# Patient Record
Sex: Male | Born: 1940
Health system: Southern US, Community
[De-identification: ages and names within clinical notes are randomized; demographics above are authoritative.]

## PROBLEM LIST (undated history)

## (undated) DIAGNOSIS — J449 Chronic obstructive pulmonary disease, unspecified: Secondary | ICD-10-CM

## (undated) DIAGNOSIS — E785 Hyperlipidemia, unspecified: Secondary | ICD-10-CM

## (undated) DIAGNOSIS — I10 Essential (primary) hypertension: Secondary | ICD-10-CM

## (undated) DIAGNOSIS — J189 Pneumonia, unspecified organism: Secondary | ICD-10-CM

## (undated) DIAGNOSIS — Z860100 Personal history of colon polyps, unspecified: Secondary | ICD-10-CM

## (undated) DIAGNOSIS — J45909 Unspecified asthma, uncomplicated: Secondary | ICD-10-CM

## (undated) HISTORY — PX: CATARACT EXTRACTION: SUR2

## (undated) HISTORY — DX: Personal history of colonic polyps: Z86.010

## (undated) HISTORY — DX: Chronic obstructive pulmonary disease, unspecified: J44.9

## (undated) HISTORY — DX: Pneumonia, unspecified organism: J18.9

## (undated) HISTORY — DX: Hyperlipidemia, unspecified: E78.5

## (undated) HISTORY — DX: Personal history of colon polyps, unspecified: Z86.0100

## (undated) HISTORY — DX: Essential (primary) hypertension: I10

---

## 1980-06-02 HISTORY — PX: LUMBAR LAMINECTOMY: SHX95

## 1993-06-02 HISTORY — PX: CHOLECYSTECTOMY: SHX55

## 1996-06-02 HISTORY — PX: OTHER SURGICAL HISTORY: SHX169

## 1998-11-08 ENCOUNTER — Ambulatory Visit (HOSPITAL_COMMUNITY): Admission: RE | Admit: 1998-11-08 | Discharge: 1998-11-09 | Payer: Self-pay

## 2001-06-02 HISTORY — PX: POLYPECTOMY: SHX149

## 2002-01-04 ENCOUNTER — Ambulatory Visit (HOSPITAL_COMMUNITY): Admission: RE | Admit: 2002-01-04 | Discharge: 2002-01-04 | Payer: Self-pay | Admitting: Gastroenterology

## 2002-01-04 ENCOUNTER — Encounter (INDEPENDENT_AMBULATORY_CARE_PROVIDER_SITE_OTHER): Payer: Self-pay | Admitting: Specialist

## 2004-01-02 ENCOUNTER — Encounter: Payer: Self-pay | Admitting: Internal Medicine

## 2004-04-17 ENCOUNTER — Ambulatory Visit: Payer: Self-pay | Admitting: Internal Medicine

## 2004-09-25 ENCOUNTER — Ambulatory Visit: Payer: Self-pay | Admitting: Internal Medicine

## 2005-02-11 ENCOUNTER — Ambulatory Visit: Payer: Self-pay | Admitting: Internal Medicine

## 2005-04-10 ENCOUNTER — Ambulatory Visit: Payer: Self-pay | Admitting: Internal Medicine

## 2005-04-28 ENCOUNTER — Ambulatory Visit: Payer: Self-pay | Admitting: Internal Medicine

## 2006-01-01 ENCOUNTER — Ambulatory Visit: Payer: Self-pay | Admitting: Internal Medicine

## 2006-03-19 ENCOUNTER — Ambulatory Visit: Payer: Self-pay | Admitting: Internal Medicine

## 2006-06-16 ENCOUNTER — Ambulatory Visit: Payer: Self-pay | Admitting: Internal Medicine

## 2006-09-09 ENCOUNTER — Ambulatory Visit: Payer: Self-pay | Admitting: Internal Medicine

## 2006-11-11 ENCOUNTER — Ambulatory Visit: Payer: Self-pay | Admitting: Internal Medicine

## 2006-11-12 ENCOUNTER — Telehealth (INDEPENDENT_AMBULATORY_CARE_PROVIDER_SITE_OTHER): Payer: Self-pay | Admitting: *Deleted

## 2006-11-12 ENCOUNTER — Telehealth: Payer: Self-pay | Admitting: Internal Medicine

## 2007-01-15 ENCOUNTER — Encounter: Payer: Self-pay | Admitting: Internal Medicine

## 2007-01-20 ENCOUNTER — Ambulatory Visit: Payer: Self-pay | Admitting: Internal Medicine

## 2007-01-20 DIAGNOSIS — M109 Gout, unspecified: Secondary | ICD-10-CM | POA: Insufficient documentation

## 2007-01-20 DIAGNOSIS — E785 Hyperlipidemia, unspecified: Secondary | ICD-10-CM | POA: Insufficient documentation

## 2007-01-20 DIAGNOSIS — R7989 Other specified abnormal findings of blood chemistry: Secondary | ICD-10-CM | POA: Insufficient documentation

## 2007-01-20 DIAGNOSIS — I10 Essential (primary) hypertension: Secondary | ICD-10-CM | POA: Insufficient documentation

## 2007-01-25 ENCOUNTER — Encounter (INDEPENDENT_AMBULATORY_CARE_PROVIDER_SITE_OTHER): Payer: Self-pay | Admitting: *Deleted

## 2007-02-02 ENCOUNTER — Encounter: Payer: Self-pay | Admitting: Internal Medicine

## 2007-04-08 ENCOUNTER — Encounter: Payer: Self-pay | Admitting: Internal Medicine

## 2007-09-08 ENCOUNTER — Ambulatory Visit: Payer: Self-pay | Admitting: Internal Medicine

## 2007-10-13 ENCOUNTER — Ambulatory Visit: Payer: Self-pay | Admitting: Internal Medicine

## 2007-10-14 ENCOUNTER — Encounter: Payer: Self-pay | Admitting: Internal Medicine

## 2007-10-14 ENCOUNTER — Encounter (INDEPENDENT_AMBULATORY_CARE_PROVIDER_SITE_OTHER): Payer: Self-pay | Admitting: *Deleted

## 2007-10-18 LAB — CONVERTED CEMR LAB
Alpha-2-Globulin: 10.3 % (ref 7.1–11.8)
Beta Globulin: 6.1 % (ref 4.7–7.2)
HCT: 41.1 % (ref 39.0–52.0)
Hemoglobin: 14.2 g/dL (ref 13.0–17.0)
MCHC: 34.6 g/dL (ref 30.0–36.0)
MCV: 92.1 fL (ref 78.0–100.0)
Platelets: 136 10*3/uL — ABNORMAL LOW (ref 150–400)
RBC: 4.46 M/uL (ref 4.22–5.81)
RDW: 14.1 % (ref 11.5–14.6)

## 2007-10-26 ENCOUNTER — Encounter (INDEPENDENT_AMBULATORY_CARE_PROVIDER_SITE_OTHER): Payer: Self-pay | Admitting: *Deleted

## 2007-10-27 ENCOUNTER — Ambulatory Visit: Payer: Self-pay | Admitting: Internal Medicine

## 2007-11-01 ENCOUNTER — Ambulatory Visit: Payer: Self-pay | Admitting: Internal Medicine

## 2007-11-01 ENCOUNTER — Encounter (INDEPENDENT_AMBULATORY_CARE_PROVIDER_SITE_OTHER): Payer: Self-pay | Admitting: *Deleted

## 2007-11-01 ENCOUNTER — Telehealth (INDEPENDENT_AMBULATORY_CARE_PROVIDER_SITE_OTHER): Payer: Self-pay | Admitting: *Deleted

## 2007-11-01 DIAGNOSIS — J45909 Unspecified asthma, uncomplicated: Secondary | ICD-10-CM | POA: Insufficient documentation

## 2007-11-02 ENCOUNTER — Telehealth (INDEPENDENT_AMBULATORY_CARE_PROVIDER_SITE_OTHER): Payer: Self-pay | Admitting: *Deleted

## 2007-11-03 ENCOUNTER — Encounter: Payer: Self-pay | Admitting: Internal Medicine

## 2008-03-17 ENCOUNTER — Ambulatory Visit: Payer: Self-pay | Admitting: Internal Medicine

## 2008-03-17 DIAGNOSIS — Z860100 Personal history of colon polyps, unspecified: Secondary | ICD-10-CM | POA: Insufficient documentation

## 2008-03-17 DIAGNOSIS — Z85828 Personal history of other malignant neoplasm of skin: Secondary | ICD-10-CM | POA: Insufficient documentation

## 2008-03-17 DIAGNOSIS — R1310 Dysphagia, unspecified: Secondary | ICD-10-CM | POA: Insufficient documentation

## 2008-03-24 ENCOUNTER — Ambulatory Visit: Payer: Self-pay | Admitting: Internal Medicine

## 2008-03-27 ENCOUNTER — Encounter (INDEPENDENT_AMBULATORY_CARE_PROVIDER_SITE_OTHER): Payer: Self-pay | Admitting: *Deleted

## 2008-03-27 LAB — CONVERTED CEMR LAB
ALT: 27 units/L (ref 0–53)
Basophils Absolute: 0.1 10*3/uL (ref 0.0–0.1)
Basophils Relative: 0.7 % (ref 0.0–3.0)
CO2: 31 meq/L (ref 19–32)
Calcium: 9 mg/dL (ref 8.4–10.5)
Cholesterol: 151 mg/dL (ref 0–200)
Creatinine, Ser: 1.1 mg/dL (ref 0.4–1.5)
Eosinophils Absolute: 0.3 10*3/uL (ref 0.0–0.7)
HCT: 41.9 % (ref 39.0–52.0)
Hemoglobin: 14.8 g/dL (ref 13.0–17.0)
Hgb A1c MFr Bld: 5.9 % (ref 4.6–6.0)
MCHC: 36 g/dL (ref 30.0–36.0)
MCV: 94.6 fL (ref 78.0–100.0)
Monocytes Absolute: 0.5 10*3/uL (ref 0.1–1.0)
Neutro Abs: 5.4 10*3/uL (ref 1.4–7.7)
RBC: 4.52 M/uL (ref 4.22–5.81)
Total Bilirubin: 1.2 mg/dL (ref 0.3–1.2)
Uric Acid, Serum: 5 mg/dL (ref 4.0–7.8)

## 2008-06-07 ENCOUNTER — Telehealth (INDEPENDENT_AMBULATORY_CARE_PROVIDER_SITE_OTHER): Payer: Self-pay | Admitting: *Deleted

## 2008-12-20 ENCOUNTER — Ambulatory Visit: Payer: Self-pay | Admitting: Internal Medicine

## 2008-12-20 DIAGNOSIS — K219 Gastro-esophageal reflux disease without esophagitis: Secondary | ICD-10-CM | POA: Insufficient documentation

## 2008-12-21 ENCOUNTER — Ambulatory Visit: Payer: Self-pay | Admitting: Cardiovascular Disease

## 2008-12-21 ENCOUNTER — Telehealth (INDEPENDENT_AMBULATORY_CARE_PROVIDER_SITE_OTHER): Payer: Self-pay | Admitting: *Deleted

## 2008-12-26 ENCOUNTER — Encounter (INDEPENDENT_AMBULATORY_CARE_PROVIDER_SITE_OTHER): Payer: Self-pay | Admitting: *Deleted

## 2008-12-28 ENCOUNTER — Encounter: Payer: Self-pay | Admitting: Internal Medicine

## 2009-02-13 ENCOUNTER — Encounter: Payer: Self-pay | Admitting: Internal Medicine

## 2009-02-13 ENCOUNTER — Telehealth (INDEPENDENT_AMBULATORY_CARE_PROVIDER_SITE_OTHER): Payer: Self-pay | Admitting: *Deleted

## 2009-03-21 ENCOUNTER — Telehealth (INDEPENDENT_AMBULATORY_CARE_PROVIDER_SITE_OTHER): Payer: Self-pay | Admitting: *Deleted

## 2009-04-12 ENCOUNTER — Telehealth (INDEPENDENT_AMBULATORY_CARE_PROVIDER_SITE_OTHER): Payer: Self-pay | Admitting: *Deleted

## 2009-05-01 ENCOUNTER — Telehealth (INDEPENDENT_AMBULATORY_CARE_PROVIDER_SITE_OTHER): Payer: Self-pay | Admitting: *Deleted

## 2009-06-06 ENCOUNTER — Encounter (INDEPENDENT_AMBULATORY_CARE_PROVIDER_SITE_OTHER): Payer: Self-pay | Admitting: *Deleted

## 2009-06-25 ENCOUNTER — Ambulatory Visit: Payer: Self-pay | Admitting: Internal Medicine

## 2009-06-25 DIAGNOSIS — M2550 Pain in unspecified joint: Secondary | ICD-10-CM | POA: Insufficient documentation

## 2009-10-17 LAB — HM COLONOSCOPY

## 2010-06-24 ENCOUNTER — Encounter: Payer: Self-pay | Admitting: Internal Medicine

## 2010-06-26 ENCOUNTER — Encounter: Payer: Self-pay | Admitting: Internal Medicine

## 2010-06-26 ENCOUNTER — Other Ambulatory Visit: Payer: Self-pay | Admitting: Internal Medicine

## 2010-06-26 ENCOUNTER — Ambulatory Visit
Admission: RE | Admit: 2010-06-26 | Discharge: 2010-06-26 | Payer: Self-pay | Source: Home / Self Care | Attending: Internal Medicine | Admitting: Internal Medicine

## 2010-06-26 DIAGNOSIS — J449 Chronic obstructive pulmonary disease, unspecified: Secondary | ICD-10-CM | POA: Insufficient documentation

## 2010-06-26 DIAGNOSIS — R1319 Other dysphagia: Secondary | ICD-10-CM | POA: Insufficient documentation

## 2010-06-26 LAB — CBC WITH DIFFERENTIAL/PLATELET
Basophils Absolute: 0 10*3/uL (ref 0.0–0.1)
HCT: 47.7 % (ref 39.0–52.0)
Lymphocytes Relative: 31.6 % (ref 12.0–46.0)
Lymphs Abs: 2.7 10*3/uL (ref 0.7–4.0)
MCHC: 34.1 g/dL (ref 30.0–36.0)
Neutrophils Relative %: 58.5 % (ref 43.0–77.0)
Platelets: 197 10*3/uL (ref 150.0–400.0)
RBC: 5 Mil/uL (ref 4.22–5.81)
RDW: 14.9 % — ABNORMAL HIGH (ref 11.5–14.6)

## 2010-06-26 LAB — LIPID PANEL: Cholesterol: 163 mg/dL (ref 0–200)

## 2010-06-26 LAB — BASIC METABOLIC PANEL
BUN: 16 mg/dL (ref 6–23)
CO2: 31 mEq/L (ref 19–32)
Calcium: 9.7 mg/dL (ref 8.4–10.5)
Chloride: 100 mEq/L (ref 96–112)
Sodium: 140 mEq/L (ref 135–145)

## 2010-06-26 LAB — HEPATIC FUNCTION PANEL: ALT: 32 U/L (ref 0–53)

## 2010-06-26 LAB — URIC ACID: Uric Acid, Serum: 4.9 mg/dL (ref 4.0–7.8)

## 2010-06-26 LAB — TSH: TSH: 1.18 u[IU]/mL (ref 0.35–5.50)

## 2010-06-30 LAB — CONVERTED CEMR LAB
ALT: 31 units/L (ref 0–53)
Albumin: 4.6 g/dL (ref 3.5–5.2)
BUN: 12 mg/dL (ref 6–23)
BUN: 15 mg/dL (ref 6–23)
Basophils Relative: 0.4 % (ref 0.0–3.0)
Bilirubin, Direct: 0.1 mg/dL (ref 0.0–0.3)
Chloride: 102 meq/L (ref 96–112)
Chloride: 104 meq/L (ref 96–112)
Cholesterol: 153 mg/dL (ref 0–200)
Cholesterol: 166 mg/dL (ref 0–200)
Eosinophils Absolute: 0.1 10*3/uL (ref 0.0–0.6)
Eosinophils Relative: 2 % (ref 0.0–5.0)
Eosinophils Relative: 2.1 % (ref 0.0–5.0)
GFR calc Af Amer: 96 mL/min
Glucose, Bld: 107 mg/dL — ABNORMAL HIGH (ref 70–99)
HCT: 46.4 % (ref 39.0–52.0)
HDL: 37.8 mg/dL — ABNORMAL LOW (ref >39.0)
Hemoglobin: 15.2 g/dL (ref 13.0–17.0)
LDL Cholesterol: 98 mg/dL (ref 0–99)
Lymphs Abs: 2.6 10*3/uL (ref 0.7–4.0)
MCHC: 34.1 g/dL (ref 30.0–36.0)
MCV: 95.5 fL (ref 78.0–100.0)
Monocytes Absolute: 0.6 10*3/uL (ref 0.1–1.0)
Monocytes Absolute: 0.6 10*3/uL (ref 0.2–0.7)
Monocytes Relative: 8.2 % (ref 3.0–11.0)
Neutro Abs: 3.9 10*3/uL (ref 1.4–7.7)
Neutro Abs: 4.7 10*3/uL (ref 1.4–7.7)
Neutrophils Relative %: 53.7 % (ref 43.0–77.0)
PSA: 1.1 ng/mL (ref 0.10–4.00)
Platelets: 189 10*3/uL (ref 150–400)
Potassium: 4.4 meq/L (ref 3.5–5.1)
Potassium: 4.5 meq/L (ref 3.5–5.1)
RBC: 4.85 M/uL (ref 4.22–5.81)
RDW: 13.6 % (ref 11.5–14.6)
Sodium: 141 meq/L (ref 135–145)
TSH: 1.19 microintl units/mL (ref 0.35–5.50)
Total Bilirubin: 1.2 mg/dL (ref 0.3–1.2)
Total CHOL/HDL Ratio: 4
Total CHOL/HDL Ratio: 4
Total Protein: 7.2 g/dL (ref 6.0–8.3)
Total Protein: 7.4 g/dL (ref 6.0–8.3)
Triglycerides: 99 mg/dL (ref 0–149)
Uric Acid, Serum: 8.7 mg/dL — ABNORMAL HIGH (ref 2.4–7.0)
WBC: 8.1 10*3/uL (ref 4.5–10.5)

## 2010-07-04 NOTE — Assessment & Plan Note (Signed)
Summary: YEARLY---WILL BE FASTING///SPH   Vital Signs:  Patient profile:   70 year old male Height:      70 inches Weight:      215 pounds BMI:     30.96 Temp:     98.7 degrees F oral Pulse rate:   74 / minute Resp:     16 per minute BP sitting:   132 / 90  (left arm) Cuff size:   large  Vitals Entered By: Shonna Chock (June 25, 2009 10:58 AM)  CC: CPX with fasting labs , General Medical Evaluation Comments REVIEWED MED LIST, PATIENT AGREED DOSE AND INSTRUCTION CORRECT    CC:  CPX with fasting labs  and General Medical Evaluation.  History of Present Illness: Edwin [name] is here for a physical; his chest congestion has improved dramatically off Ramipril & with addition of Symbicort 1 puff two times a day . BP not monitored @ home.  Preventive Screening-Counseling & Management  Alcohol-Tobacco     Smoking Status: quit  Allergies (verified): 1)  ! Ramipril (Ramipril)  Past History:  Past Medical History: Glaucoma Gout Hyperlipidemia Hypertension Reactive Airway Disease Skin cancer, hx of Basal Cell X1 , 2003, Dr Patseavorous Colonic polyps, hx of ACE-I cough  Past Surgical History: Tear duct surgery 1998 for excess tearing ; skin graft L foot Cataract extraction bilat Cholecystectomy Colon polypectomy  2003 ; negative 2006; F/U 2011, Dr Kinnie Scales Lumbar laminectomy  Family History: Father:COAD, asthma, MI in 80s,CHF Mother: HTN, borderline DM Siblings: negative 2 sons asthma  Social History: Occupation: Art therapist Married Alcohol use-yes: socially Regular exercise-no Former Smoker: quit 1981; smoked 20 years up to 3 ppd  Review of Systems General:  Complains of sleep disorder; denies chills, fever, sweats, and weight loss; Sleeps 5-6 hrs /night, ? from arthritis. Eyes:  Denies blurring, double vision, and vision loss-both eyes. ENT:  Complains of difficulty swallowing, nasal congestion, and sinus pressure; denies hoarseness; Occasional  dysphagia with food & pills. CV:  Complains of swelling of feet; denies chest pain or discomfort, difficulty breathing at night, difficulty breathing while lying down, leg cramps with exertion, and palpitations; Occa edema of feet. Resp:  Complains of cough, excessive snoring, shortness of breath, and sputum productive; denies chest pain with inspiration, coughing up blood, hypersomnolence, morning headaches, and wheezing; 1 tsp sputum 7X/day. GI:  Denies abdominal pain, bloody stools, dark tarry stools, and indigestion. GU:  Denies discharge, dysuria, and hematuria; Nocturia X 1. MS:  Complains of joint pain and joint swelling; denies joint redness, low back pain, mid back pain, and thoracic pain; Hand swelling & pain in am. Derm:  Denies changes in nail beds, dryness, hair loss, lesion(s), and rash. Neuro:  Denies headaches, numbness, and tingling. Psych:  Denies anxiety, depression, easily angered, easily tearful, and irritability. Endo:  Denies cold intolerance, excessive hunger, excessive thirst, excessive urination, and heat intolerance. Heme:  Denies abnormal bruising and bleeding. Allergy:  Complains of itching eyes, seasonal allergies, and sneezing; denies hives or rash; No angioedema.  Physical Exam  General:  well-nourished,in no acute distress; alert,appropriate and cooperative throughout examination Head:  Normocephalic and atraumatic without obvious abnormalities.  Eyes:  No corneal or conjunctival inflammation noted. Perrla. Funduscopic exam benign, without hemorrhages, exudates or papilledema.  Ears:  External ear exam shows no significant lesions or deformities.  Otoscopic examination reveals clear canals, tympanic membranes are intact bilaterally without bulging, retraction, inflammation or discharge. Hearing is grossly normal bilaterally. Nose:  External nasal examination shows  no deformity or inflammation. Nasal mucosa are pink and moist without lesions or exudates. Septum to  R Mouth:  Oral mucosa and oropharynx without lesions or exudates.  Teeth in good repair. Neck:  No deformities, masses, or tenderness noted. Lungs:  Normal respiratory effort, chest expands symmetrically. Lungs are clear to auscultation, no crackles or wheezes. Heart:  Normal rate and regular rhythm. S1 and S2 normal without gallop, murmur, click, rub. S4 with occasional premature Abdomen:  Bowel sounds positive,abdomen soft and non-tender without masses, organomegaly or hernias noted. Rectal:  No external abnormalities noted. Normal sphincter tone. No rectal masses or tenderness. Genitalia:  Testes bilaterally descended without nodularity, tenderness or masses. No scrotal masses or lesions. No penis lesions or urethral discharge. Prostate:  Prostate gland firm and smooth, no enlargement, nodularity, tenderness, mass, asymmetry or induration. Msk:  No deformity or scoliosis noted of thoracic or lumbar spine.   Pulses:  R and L carotid,radial,dorsalis pedis and posterior tibial pulses are full and equal bilaterally Extremities:  No clubbing, cyanosis, edema, or deformity noted with normal full range of motion of all joints.   Neurologic:  alert & oriented X3 and DTRs symmetrical and normal.   Skin:  Intact without suspicious lesions or rashes Cervical Nodes:  No lymphadenopathy noted Inguinal Nodes:  No significant adenopathy Psych:  memory intact for recent and remote, normally interactive, and good eye contact.     Impression & Recommendations:  Problem # 1:  ROUTINE GENERAL MEDICAL EXAM@HEALTH  CARE FACL (ICD-V70.0)  Orders: EKG w/ Interpretation (93000) Venipuncture (91478) TLB-Lipid Panel (80061-LIPID) TLB-BMP (Basic Metabolic Panel-BMET) (80048-METABOL) TLB-CBC Platelet - w/Differential (85025-CBCD) TLB-Hepatic/Liver Function Pnl (80076-HEPATIC) TLB-TSH (Thyroid Stimulating Hormone) (84443-TSH) TLB-PSA (Prostate Specific Antigen) (84153-PSA) TLB-Uric Acid, Blood  (84550-URIC) TLB-Rheumatoid Factor (RA) (29562-ZH) TLB-Sedimentation Rate (ESR) (85652-ESR)  Problem # 2:  REACTIVE AIRWAY DISEASE (ICD-493.90)  His updated medication list for this problem includes:    Ventolin Hfa 108 (90 Base) Mcg/act Aers (Albuterol sulfate) .[name] Kitchen... 1-2 puffs q 4-6 hrs as needed cough    Symbicort 160-4.5 Mcg/act Aero (Budesonide-formoterol fumarate) .[name] Kitchen... 2 puffs  q 12 hrs  Problem # 3:  HYPERTENSION (ICD-401.9)  His updated medication list for this problem includes:    Cozaar 100 Mg Tabs (Losartan potassium) .[name] Kitchen... 1 once daily inplace of ramipril  Orders: EKG w/ Interpretation (93000): unifocal PVCs Venipuncture (08657)  Problem # 4:  HYPERLIPIDEMIA (ICD-272.4)  Orders: Venipuncture (84696) TLB-Lipid Panel (80061-LIPID)  Problem # 5:  ARTHRALGIA (ICD-719.40)  Orders: Venipuncture (29528) TLB-Uric Acid, Blood (84550-URIC) TLB-Rheumatoid Factor (RA) (41324-MW) TLB-Sedimentation Rate (ESR) (85652-ESR)  Complete Medication List: 1)  Prozac 10 Mg Caps (Fluoxetine hcl) .... Once daily 2)  Allopurinol 300 Mg Tabs (Allopurinol) .... Once daily once gout resolved for @ least 1 week hold 3)  Ventolin Hfa 108 (90 Base) Mcg/act Aers (Albuterol sulfate) .[name] Kitchen.. 1-2 puffs q 4-6 hrs as needed cough 4)  Symbicort 160-4.5 Mcg/act Aero (Budesonide-formoterol fumarate) .... 2 puffs  q 12 hrs 5)  Cozaar 100 Mg Tabs (Losartan potassium) .[name] Kitchen.. 1 once daily inplace of ramipril 6)  Nexium 40 Mg Cpdr (Esomeprazole magnesium) .[name] Kitchen.. 1 two times a day pre meals 7)  Omeprazole 20 Mg Tbec (Omeprazole) .[name] Kitchen.. 1 by mouth once daily  Patient Instructions: 1)  Avoids stimulants due to PVCs. 2)  Check your Blood Pressure regularly. If it is above:135/85 ON AVERAGE  you should call.

## 2010-07-04 NOTE — Letter (Signed)
Summary: Primary Care Appointment Letter  [facility]  [address] [place], [place] [address]   Phone: [phone]  Fax: [phone]    06/06/2009 MRN: [id number]  [facility] [address] [address] [place], [place]  [address]  Dear Mr. TANIGUCHI,   Your Primary Care Physician Marga Melnick MD has indicated that:    ____X___it is time to schedule an appointment(please call to schedule a yearly"30 min appointment" with fasting labs) An appointment is necessary to continue refilling medications.    _______you missed your appointment on______ and need to call and          reschedule.    _______you need to have lab work done.    _______you need to schedule an appointment discuss lab or test results.    _______you need to call to reschedule your appointment that is                       scheduled on _________.     Please call our office as soon as possible. Our phone number is [phone]. Please press option 1. Our office is open 8a-12noon and 1p-5p, Monday through Friday.     Thank you,    [facility]uler

## 2010-07-04 NOTE — Assessment & Plan Note (Signed)
Summary: cpx//lch   Vital Signs:  Patient profile:   70 year old male Height:      70.75 inches Weight:      212 pounds BMI:     29.88 Temp:     98.4 degrees F oral Pulse rate:   72 / minute Resp:     16 per minute BP sitting:   112 / 64  (left arm) Cuff size:   large  Vitals Entered By: Shonna Chock CMA (June 26, 2010 11:33 AM) CC: CPX with fasting labs , COPD follow-up, Heartburn   CC:  CPX with fasting labs , COPD follow-up, and Heartburn.  History of Present Illness: Here for Medicare AWV: 1.Risk factors based on Past M, S, F history:see Diagnoses ; chart updated 2.Physical Activities: no program 3.Depression/mood: no issues 4.Hearing: hearing to whisper @ 6 ft  minimally reduced  5.ADL's: no limitations 6.Fall Risk:denied  7.Home Safety:no issues  8.Height, weight, &visual acuity:see VS 9.Counseling: POA & Living Will in place 10.Labs ordered based on risk factors:  11. Referral Coordination: Audiolgy referral if hearing issues problematic 12.Care Plan:see Instructions 13. Cognitive Assessment:Oriented X 3 ; memory & recall   normaL  ; "WORLD" spelled backwards; mood &  affect  normal.    COPD Follow-Up: The patient reports shortness of breath,  intermittent wheezing, cough, daily  sputum, and exercise induced symptoms ( DOE post flight of stairs), but denies chest tightness, heat intolerance, and nocturnal awakening.  The patient reports limitation of moderate activities .  Medication use includes controller med daily, Symbicort.      GERD:   The patient reports acid reflux, but denies sour taste in mouth, epigastric pain, trouble swallowing, weight loss or  weight gain. Excess secretions chronic issue. The patient reports the following alarm features of dyspepsia: dysphagia with pills every 8-10 days.  The patient denies the following alarm features: melena, hematemesis, and vomiting.  Symptoms are worse with lying down.  The patient has found the following treatments  to be effective: a PPI, but "I forget Nexium ( see Med List update), up to 3 days/ week".      Hypertension Follow-Up:  The patient reports edema, but denies lightheadedness, urinary frequency, headaches, and fatigue.  The patient denies the following associated symptoms: palpitations and syncope.  Adjunctive measures currently NOT  used by the patient include salt restriction.  BP not checked @ home .  Preventive Screening-Counseling & Management  Alcohol-Tobacco     Alcohol drinks/day: 1-2     Smoking Status: quit > 6 months     Packs/Day: 1ppd X 10 yrs; 2.5 ppd X 20 yrs     Year Started: 1965     Year Quit: 1985     Pack years: 35  Caffeine-Diet-Exercise     Caffeine use/day: 2 CUPS/ DAY  Hep-HIV-STD-Contraception     Dental Visit-last 6 months yes     Sun Exposure-Excessive: no  Safety-Violence-Falls     Seat Belt Use: yes     Smoke Detectors: yes      Blood Transfusions:  no.        Travel History:  03/2010 [place].    Allergies: 1)  ! Ramipril (Ramipril)  Past History:  Past Medical History: Glaucoma Gout Hyperlipidemia: NMR Lipoprofile 2005: LDL 113 (1416/825), HDL 37, TG 190. Hypertension  COPD/Reactive Airway Disease Skin cancer, hx of Basal Cell X1 , 2003, Dr Patseavorous Colonic polyps, hx of ACE-I  induced cough  Past Surgical History: Tear duct surgery  1998 for excess tearing ; skin graft L foot @ age 24&1/2 Cataract extraction bilaterally, implants Cholecystectomy1995 Colon polypectomy  2003 ; negative 2006; F/U 2011, Dr Kinnie Scales Lumbar laminectomy 1982  Family History: Father:COAD, asthma, MI in 80s,CHF Mother: HTN, borderline DM Siblings: negative 2 sons :asthma  Social History: Occupation: Art therapist Married Alcohol use-yes: socially Regular exercise-no Former Smoker: quit 1985, smoked 20 years up to 2.5  ppd (35 pack yr total) Caffeine use/day:  2 CUPS/ DAY Dental Care w/in 6 mos.:  yes Sun Exposure-Excessive:  no Seat Belt Use:   yes Blood Transfusions:  no Smoking Status:  quit > 6 months Packs/Day:  1ppd X 10 yrs; 2.5 ppd X 20 yrs  Review of Systems       The patient complains of hoarseness.  The patient denies anorexia, fever, vision loss, abnormal bleeding, enlarged lymph nodes, and angioedema.    Physical Exam  General:  well-nourished,in no acute distress; alert,appropriate and cooperative throughout examination Head:  Normocephalic and atraumatic without obvious abnormalities.  Pattern  alopecia . Eyes:  No corneal or conjunctival inflammation noted. Perrla. Funduscopic exam benign, without hemorrhages, exudates or papilledema.  Ears:  External ear exam shows no significant lesions or deformities.  Otoscopic examination reveals clear canals, tympanic membranes are intact bilaterally without bulging, retraction, inflammation or discharge. Hearing is grossly normal bilaterally. Nose:  External nasal examination shows no deformity or inflammation. Nasal mucosa are pink and moist without lesions or exudates. Mouth:  Oral mucosa and oropharynx without lesions or exudates.  Teeth in good repair. No pharyngeal erythema.   Neck:  No deformities, masses, or tenderness noted. Lungs:  Normal respiratory effort, chest expands symmetrically. Lungs :bibasilar rales ; no  wheezes. Heart:  Normal rate and regular rhythm. S1 and S2 normal without gallop, murmur, click, rub or other extra sounds. Abdomen:  Bowel sounds positive,abdomen soft and non-tender without masses, organomegaly or hernias noted. Rectal:  No external abnormalities noted. Normal sphincter tone. No rectal masses or tenderness. Genitalia:  Testes bilaterally descended without nodularity, tenderness or masses. No scrotal masses or lesions. No penis lesions or urethral discharge. Prostate:  Prostate gland firm and smooth, ULN w/o enlargement,no  nodularity, tenderness, mass, asymmetry or induration. Msk:  No deformity or scoliosis noted of thoracic or lumbar  spine.   Pulses:  R and L carotid,radial,dorsalis pedis and posterior tibial pulses are full and equal bilaterally Extremities:  No clubbing, cyanosis, edema, or deformity noted with normal full range of motion of all joints. Mild crepitus of knees Neurologic:  alert & oriented X3 and DTRs symmetrical and normal.   Skin:  Intact without suspicious lesions or rashes Cervical Nodes:  No lymphadenopathy noted Psych:  memory intact for recent and remote, normally interactive, and good eye contact.     Impression & Recommendations:  Problem # 1:  PREVENTIVE HEALTH CARE (ICD-V70.0)  Orders: Medicare -1st Annual Wellness Visit [phone])  Problem # 2:  CHRONIC OBSTRUCTIVE PULMONARY DISEASE (ICD-496) Suboptimally treated GERD may be contributing factor to symptoms The following medications were removed from the medication list:    Ventolin Hfa 108 (90 Base) Mcg/act Aers (Albuterol sulfate) .[name] Kitchen... 1-2 puffs q 4-6 hrs as needed cough His updated medication list for this problem includes:    Symbicort 160-4.5 Mcg/act Aero (Budesonide-formoterol fumarate) .[name] Kitchen... 2 puffs  q 12 hrs; gargle & spit after use    Spiriva Handihaler 18 Mcg Caps (Tiotropium bromide monohydrate) ..... Inhale contents of a capsule 30 min after symbicort in  am  Problem # 3:  GERD (ICD-530.81) Compliance discussed The following medications were removed from the medication list:    Nexium 40 Mg Cpdr (Esomeprazole magnesium) .[name] Kitchen... 1 two times a day pre meals His updated medication list for this problem includes:    Omeprazole 20 Mg Tbec (Omeprazole) .[name] Kitchen... 1 by mouth once daily  Orders: Venipuncture (16109) TLB-CBC Platelet - w/Differential (85025-CBCD) Gastroenterology Referral (GI)  Problem # 4:  OTHER DYSPHAGIA (ICD-787.29)  pill dysphagia  Orders: Gastroenterology Referral (GI)  Problem # 5:  COLONIC POLYPS, HX OF (ICD-V12.72)  as per Dr Kinnie Scales  Orders: Venipuncture (60454) TLB-CBC Platelet - w/Differential  (85025-CBCD) Gastroenterology Referral (GI)  Problem # 6:  GOUT (ICD-274.9)  PMH of His updated medication list for this problem includes:    Allopurinol 300 Mg Tabs (Allopurinol) ..... Once daily once gout resolved for @ least 1 week hold  Orders: Venipuncture (09811) TLB-Uric Acid, Blood (84550-URIC)  Problem # 7:  HYPERTENSION (ICD-401.9)  His updated medication list for this problem includes:    Cozaar 100 Mg Tabs (Losartan potassium) .[name] Kitchen... 1 once daily inplace of ramipril Orders: EKG w/ Interpretation (93000): unifocal PVCs Venipuncture (91478) TLB-BMP (Basic Metabolic Panel-BMET) (80048-METABOL)  Complete Medication List: 1)  Prozac 10 Mg Caps (Fluoxetine hcl) .... Once daily 2)  Allopurinol 300 Mg Tabs (Allopurinol) .... Once daily once gout resolved for @ least 1 week hold 3)  Symbicort 160-4.5 Mcg/act Aero (Budesonide-formoterol fumarate) .... 2 puffs  q 12 hrs; gargle & spit after use 4)  Cozaar 100 Mg Tabs (Losartan potassium) .[name] Kitchen.. 1 once daily inplace of ramipril**appointment due 06/2010** 5)  Omeprazole 20 Mg Tbec (Omeprazole) .[name] Kitchen.. 1 by mouth once daily 6)  Spiriva Handihaler 18 Mcg Caps (Tiotropium bromide monohydrate) .... Inhale contents of a capsule 30 min after symbicort in am  Other Orders: Zoster (Shingles) Vaccine Live (29562) Admin 1st Vaccine (13086) TLB-Lipid Panel (80061-LIPID) TLB-Hepatic/Liver Function Pnl (80076-HEPATIC) TLB-TSH (Thyroid Stimulating Hormone) (84443-TSH)  Patient Instructions: 1)  Nexium  OR Omeprazole  30 min pre b'fast without fail. 2)  Avoid foods high in acid (tomatoes, citrus juices, spicy foods). Avoid eating within two hours of lying down or before exercising. Do not over eat; try smaller more frequent meals. Elevate head of bed twelve inches when sleeping.Assess response to Spiriva samples; fill Rx if effective. Prescriptions: SPIRIVA HANDIHALER 18 MCG CAPS (TIOTROPIUM BROMIDE MONOHYDRATE) inhale contents of a capsule 30 min  after Symbicort in am  #1 month x 11   Entered and Authorized by:   Marga Melnick MD   Signed by:   Marga Melnick MD on 06/26/2010   Method used:   Print then Give to Patient   RxID:   [id number] ALLOPURINOL 300 MG TABS (ALLOPURINOL) once daily once gout resolved for @ least 1 week hold  #90 Tablet x 3   Entered and Authorized by:   Marga Melnick MD   Signed by:   Marga Melnick MD on 06/26/2010   Method used:   Electronically to        [facility] [address] (retail)       [address]       [place], [place]  [address]       Ph: [phone]       Fax: [phone]   RxID:   [id number]    Orders Added: 1)  Zoster (Shingles) Vaccine Live [90736] 2)  Admin 1st Vaccine [90471] 3)  Medicare -1st Annual Wellness Visit [G0438] 4)  Est.  Patient Level III [29528] 5)  EKG w/ Interpretation [93000] 6)  Venipuncture [36415] 7)  TLB-Lipid Panel [80061-LIPID] 8)  TLB-BMP (Basic Metabolic Panel-BMET) [80048-METABOL] 9)  TLB-CBC Platelet - w/Differential [85025-CBCD] 10)  TLB-Hepatic/Liver Function Pnl [80076-HEPATIC] 11)  TLB-TSH (Thyroid Stimulating Hormone) [84443-TSH] 12)  TLB-Uric Acid, Blood [84550-URIC] 13)  Gastroenterology Referral [GI]   Immunizations Administered:  Zostavax # 1:    Vaccine Type: Zostavax    Site: Right Arm    Mfr: Merck    Dose: 0.38mL    Route: [place]    Given by: Shonna Chock CMA    Exp. Date: 06/15/2011    Lot #: [id number]    VIS given: 03/14/05 given June 26, 2010.   Immunizations Administered:  Zostavax # 1:    Vaccine Type: Zostavax    Site: Right Arm    Mfr: Merck    Dose: 0.15mL    Route: [place]    Given by: Shonna Chock CMA    Exp. Date: 06/15/2011    Lot #: [id number]    VIS given: 03/14/05 given June 26, 2010.

## 2010-07-08 ENCOUNTER — Encounter: Payer: Self-pay | Admitting: Internal Medicine

## 2010-07-18 NOTE — Procedures (Signed)
Summary: Colonoscopy/[facility]  Colonoscopy/[facility]   Imported By: Sherian Rein 07/09/2010 10:23:50  _____________________________________________________________________  External Attachment:    Type:   Image     Comment:   External Document

## 2010-07-22 ENCOUNTER — Other Ambulatory Visit: Payer: Self-pay | Admitting: Otolaryngology

## 2010-07-30 NOTE — Letter (Signed)
Summary: [facility]   Imported By: Maryln Gottron 07/23/2010 15:17:09  _____________________________________________________________________  External Attachment:    Type:   Image     Comment:   External Document

## 2010-08-05 ENCOUNTER — Ambulatory Visit (INDEPENDENT_AMBULATORY_CARE_PROVIDER_SITE_OTHER): Payer: Medicare Other | Admitting: Internal Medicine

## 2010-08-05 ENCOUNTER — Encounter: Payer: Self-pay | Admitting: Internal Medicine

## 2010-08-05 DIAGNOSIS — J209 Acute bronchitis, unspecified: Secondary | ICD-10-CM

## 2010-08-06 ENCOUNTER — Other Ambulatory Visit: Payer: Self-pay | Admitting: Internal Medicine

## 2010-08-06 ENCOUNTER — Ambulatory Visit (INDEPENDENT_AMBULATORY_CARE_PROVIDER_SITE_OTHER)
Admission: RE | Admit: 2010-08-06 | Discharge: 2010-08-06 | Disposition: A | Discharge status: Home / Self Care | Payer: Medicare Other | Source: Ambulatory Visit | Attending: Internal Medicine | Admitting: Internal Medicine

## 2010-08-06 DIAGNOSIS — J209 Acute bronchitis, unspecified: Secondary | ICD-10-CM

## 2010-08-13 NOTE — Assessment & Plan Note (Signed)
Summary: chest congestion/nta   Vital Signs:  Patient profile:   70 year old male Weight:      210.4 pounds BMI:     29.66 Temp:     99.0 degrees F oral Pulse rate:   92 / minute Resp:     15 per minute BP sitting:   122 / 70  (left arm) Cuff size:   large  Vitals Entered By: Shonna Chock CMA (August 05, 2010 2:03 PM) CC: Chest congestion x 8-9 days, URI symptoms   CC:  Chest congestion x 8-9 days and URI symptoms.  History of Present Illness:    Onset 9 days ago as ST;the Anesthiologist declined to clear him for Endoscopy this am due to O2 sats of 93% & RAD findings ("coarse sounding") on exam.He  reports scant purulent nasal discharge and sore throat in am , but denies earache.  Associated symptoms include dyspnea.  The patient denies fever and wheezing.  The patient denies headache.  The patient denies the following risk factors for Strep sinusitis: unilateral facial pain, tooth pain, and tender adenopathy. He has  productive cough with green sputum.    Current Medications (verified): 1)  Prozac 10 Mg Caps (Fluoxetine Hcl) .... Once Daily 2)  Allopurinol 300 Mg Tabs (Allopurinol) .... Once Daily Once Gout Resolved For @ Least 1 Week Hold 3)  Symbicort 160-4.5 Mcg/act Aero (Budesonide-Formoterol Fumarate) .... 2 Puffs  Q 12 Hrs; Gargle & Spit After Use 4)  Cozaar 100 Mg Tabs (Losartan Potassium) .[name] Kitchen.. 1 Once Daily Inplace of Ramipril 5)  Omeprazole 20 Mg Tbec (Omeprazole) .[name] Kitchen.. 1 By Mouth Once Daily 6)  Spiriva Handihaler 18 Mcg Caps (Tiotropium Bromide Monohydrate) .... Inhale Contents of A Capsule 30 Min After Symbicort in Am  Allergies: 1)  ! Ramipril (Ramipril)  Physical Exam  General:  well-nourished,in no acute distress; alert,appropriate and cooperative throughout examination Ears:  External ear exam shows no significant lesions or deformities.  Otoscopic examination reveals clear canals, tympanic membranes are intact bilaterally without bulging, retraction, inflammation or  discharge. Hearing is grossly normal bilaterally. Nose:  External nasal examination shows no deformity or inflammation. Nasal mucosa are pink and moist without lesions or exudates. Mouth:  Oral mucosa and oropharynx without lesions or exudates.  Teeth in good repair. Lungs:  Normal respiratory effort, chest expands symmetrically. Lungs : scattered rhonchi , no crackles or wheezes.No increased WOB Heart:  Normal rate and regular rhythm. S1 and S2 normal without gallop, murmur, click, rub. Cervical Nodes:  No lymphadenopathy noted Axillary Nodes:  No palpable lymphadenopathy   Impression & Recommendations:  Problem # 1:  BRONCHITIS-ACUTE (ICD-466.0)  RAD component .His updated medication list for this problem includes:    Symbicort 160-4.5 Mcg/act Aero (Budesonide-formoterol fumarate) .[name] Kitchen... 2 puffs  q 12 hrs; gargle & spit after use    Spiriva Handihaler 18 Mcg Caps (Tiotropium bromide monohydrate) ..... Inhale contents of a capsule 30 min after symbicort in am    Amoxicillin-pot Clavulanate 875-125 Mg Tabs (Amoxicillin-pot clavulanate) .[name] Kitchen... 1 every 12 hrs with a meal  Orders: T-2 View CXR (71020TC) Prescription Created Electronically [phone])  Complete Medication List: 1)  Prozac 10 Mg Caps (Fluoxetine hcl) .... Once daily 2)  Allopurinol 300 Mg Tabs (Allopurinol) .... Once daily once gout resolved for @ least 1 week hold 3)  Symbicort 160-4.5 Mcg/act Aero (Budesonide-formoterol fumarate) .... 2 puffs  q 12 hrs; gargle & spit after use 4)  Cozaar 100 Mg Tabs (Losartan potassium) .[name] KitchenMarland KitchenMarland Kitchen 1  once daily inplace of ramipril 5)  Omeprazole 20 Mg Tbec (Omeprazole) .[name] Kitchen.. 1 by mouth once daily 6)  Spiriva Handihaler 18 Mcg Caps (Tiotropium bromide monohydrate) .... Inhale contents of a capsule 30 min after symbicort in am 7)  Amoxicillin-pot Clavulanate 875-125 Mg Tabs (Amoxicillin-pot clavulanate) .[name] Kitchen.. 1 every 12 hrs with a meal 8)  Prednisone 20 Mg Tabs (Prednisone) .[name] Kitchen.. 1 two times a day with a  meal  Patient Instructions: 1)  Increase Symbicort to puffs every 12 hrs until well. 2)  Drink as much fluid as you can tolerate for the next few days. Prescriptions: PREDNISONE 20 MG TABS (PREDNISONE) 1 two times a day with a meal  #14 x 0   Entered and Authorized by:   Marga Melnick MD   Signed by:   Marga Melnick MD on 08/05/2010   Method used:   Electronically to        [facility] # [address]* (retail)       [address]       [place], [place]  [address]       Ph: [phone]       Fax: [phone]   RxID:   [id number] AMOXICILLIN-POT CLAVULANATE 875-125 MG TABS (AMOXICILLIN-POT CLAVULANATE) 1 every 12 hrs with a meal  #20 x 0   Entered and Authorized by:   Marga Melnick MD   Signed by:   Marga Melnick MD on 08/05/2010   Method used:   Electronically to        [facility] [address]* (retail)       [address]       [place], [place]  [address]       Ph: [phone]       Fax: [phone]   RxID:   [id number]    Orders Added: 1)  Est. Patient Level III [88416] 2)  T-2 View CXR [71020TC] 3)  Prescription Created Electronically [phone]

## 2010-08-22 ENCOUNTER — Encounter: Payer: Self-pay | Admitting: Internal Medicine

## 2010-08-23 ENCOUNTER — Telehealth (INDEPENDENT_AMBULATORY_CARE_PROVIDER_SITE_OTHER): Payer: Medicare Other | Admitting: *Deleted

## 2010-08-23 ENCOUNTER — Ambulatory Visit (INDEPENDENT_AMBULATORY_CARE_PROVIDER_SITE_OTHER): Payer: Medicare Other | Admitting: Internal Medicine

## 2010-08-23 ENCOUNTER — Encounter: Payer: Self-pay | Admitting: Internal Medicine

## 2010-08-23 VITALS — BP 118/60 | HR 71 | Temp 98.6°F | Ht 70.5 in | Wt 209.6 lb

## 2010-08-23 DIAGNOSIS — J209 Acute bronchitis, unspecified: Secondary | ICD-10-CM

## 2010-08-23 DIAGNOSIS — J42 Unspecified chronic bronchitis: Secondary | ICD-10-CM

## 2010-08-23 MED ORDER — LEVOFLOXACIN 500 MG PO TABS: 500.0000 mg | ORAL_TABLET | Freq: Every day | ORAL | Status: AC

## 2010-08-23 MED ORDER — PREDNISONE 20 MG PO TABS: ORAL_TABLET | ORAL | Status: DC

## 2010-08-23 MED ORDER — MOXIFLOXACIN HCL 400 MG PO TABS: 400.0000 mg | ORAL_TABLET | Freq: Every day | ORAL | Status: AC

## 2010-08-23 NOTE — Telephone Encounter (Signed)
rx sent to pharmacy

## 2010-08-23 NOTE — Progress Notes (Signed)
  Subjective:    Patient ID: Edwin [name], male    DOB: 05/14/1941, 70 y.o.   MRN: [id number]  HPI   He returns with a flare of his obstructive airways disease.   he was stable for a few days after completing a ten-day course of amoxicillin/clavulanate. Within 3 days of completing a course of antibiotics he began to have increased secretions with associated productive cough , dyspnea, and wheezing. This is despite continuing to controller  Symbicort and Spiriva on daily basis.   He denies ear pain or discharge ; frontal headache; facial pain; or dental pain. He has had some purulent nasal secretions , but the sputum volume is greater than the nasal secretions. He also has had some sore throat with the flair.   Review of Systems     Objective:   Physical Exam he is in no acute distress. He has no lymphadenopathy of the  neck or axilla. Nares are dry without exudate. There is mild erythema of the oropharynx without exudate. He exhibits mild rhonchi and expiratory wheezing especially in the right midposterior thorax . He produces thick clear secretions with some gray, prominent  He has an S4 without significant murmur ; there is no gallop or rub.  He has no edema; there is no cyanosis or clubbing.        Assessment & Plan:   #1  Flare of chronic obstructive lung disease with purulent secretions despite a ten-day course of broad-spectrum antibiotic and ongoing use of controller agents.    he has had dramatic response to oral surgeon the past. There is less risk in attempting low dose ongoing steroid treatment or every other day treatment rather than him taking  recurrent antibiotics.   The pathophysiology of resistant infections was discussed with him as well as the pathophysiology of obstructive lung disease with an asthmatic bronchitic component as well as emphysematous components.    Plan : #1 Avelox  ; oral prednisone with a slow wean ; plain Mucinex and forced oral hydration for  secretion control.

## 2010-08-23 NOTE — Telephone Encounter (Signed)
Levofloxacin 500 mg one daily dispense 7

## 2010-08-23 NOTE — Patient Instructions (Signed)
Drink as much nondairy fluids as you can the thin the secretions; also use plain Mucinex as per the package label. Take the 20 mg of prednisone twice a day for one week then decreased to a half a pill 3 times a day for a week and then to one half twice a day for a week. During the fourth week take one half pill daily. See me at that time to assess long-term medications preventively. Please gargle well and spit after using the Symbicort

## 2010-10-08 ENCOUNTER — Ambulatory Visit (INDEPENDENT_AMBULATORY_CARE_PROVIDER_SITE_OTHER): Payer: Medicare Other | Admitting: Internal Medicine

## 2010-10-08 ENCOUNTER — Encounter: Payer: Self-pay | Admitting: Internal Medicine

## 2010-10-08 DIAGNOSIS — J449 Chronic obstructive pulmonary disease, unspecified: Secondary | ICD-10-CM

## 2010-10-08 DIAGNOSIS — J42 Unspecified chronic bronchitis: Secondary | ICD-10-CM

## 2010-10-08 DIAGNOSIS — J209 Acute bronchitis, unspecified: Secondary | ICD-10-CM

## 2010-10-08 DIAGNOSIS — J45909 Unspecified asthma, uncomplicated: Secondary | ICD-10-CM

## 2010-10-08 MED ORDER — PREDNISONE 20 MG PO TABS: ORAL_TABLET | ORAL | Status: DC

## 2010-10-08 NOTE — Patient Instructions (Signed)
Assess your response to  prednisone 20 mg one half pill Monday, Wednesday, Friday, and Sunday. Continue the Spiriva and Symbicort as prescribed.

## 2010-10-08 NOTE — Progress Notes (Signed)
  Subjective:    Patient ID: Edwin [name], male    DOB: [birth year], 70 y.o.   MRN: [id number]  HPI COPD Cough:intermittently Sputum production:1/2 cup / day , clear to yellow Hemoptysis:no Dyspnea (rest/exertional/PND):stable Wheezing:no Chest pain, edema, palpitations:slight edema Treatment/efficacy:Prednisone helped  Use of rescue inhaler:not used Use of maintenance inhaler:Symbicort 2 puffs bid; Spiriva Smoking:smoked from 1965- 1985, up to 2- 3 ppd Past medical history: Asthma; Allergies;no Family history pulmonary disease:father had emphysema & asthma     Review of Systems     Objective:   Physical Exam Heart:  Normal rate and regular rhythm. S1 and S2 normal without gallop, murmur, click, rub . Minimal rhonchi.Lungs:Chest surprisingly  clear to auscultation; no wheezes, rales ,or rubs present.No increased work of breathing. Extremities:  No cyanosis, edema, or deformity noted      Assessment & Plan:  #1 COPD with an inflammatory component. He's noted benefit with Spiriva. He describes dramatic improvement while on the oral steroids.  Plan: A trial of prednisone 10 mg Monday, Wednesday, Friday, and since Sunday.

## 2010-11-20 ENCOUNTER — Telehealth: Payer: Self-pay

## 2010-11-20 MED ORDER — CLOTRIMAZOLE-BETAMETHASONE 1-0.05 % EX CREA: TOPICAL_CREAM | Freq: Two times a day (BID) | CUTANEOUS | Status: DC

## 2010-11-20 NOTE — Telephone Encounter (Signed)
Patient's wife was here in the office and requested a RX for her husband for Clotrinazole and Betamethasone Bipropionate Cream 1%/0.05% Dr.Hopper please advise for this med is not on current med list

## 2010-11-20 NOTE — Telephone Encounter (Signed)
Ok to refill medication per Dr. Alwyn Ren

## 2010-12-02 ENCOUNTER — Other Ambulatory Visit: Payer: Self-pay | Admitting: Internal Medicine

## 2011-01-02 ENCOUNTER — Encounter: Payer: Self-pay | Admitting: Internal Medicine

## 2011-01-02 ENCOUNTER — Ambulatory Visit (INDEPENDENT_AMBULATORY_CARE_PROVIDER_SITE_OTHER): Payer: Medicare Other | Admitting: Internal Medicine

## 2011-01-02 ENCOUNTER — Telehealth: Payer: Self-pay | Admitting: Internal Medicine

## 2011-01-02 VITALS — BP 130/78 | HR 85 | Temp 98.4°F | Wt 209.6 lb

## 2011-01-02 DIAGNOSIS — J42 Unspecified chronic bronchitis: Secondary | ICD-10-CM

## 2011-01-02 DIAGNOSIS — J209 Acute bronchitis, unspecified: Secondary | ICD-10-CM

## 2011-01-02 MED ORDER — PREDNISONE 20 MG PO TABS: ORAL_TABLET | ORAL | Status: DC

## 2011-01-02 MED ORDER — LEVOFLOXACIN 500 MG PO TABS: 500.0000 mg | ORAL_TABLET | Freq: Every day | ORAL | Status: AC

## 2011-01-02 NOTE — Telephone Encounter (Signed)
Spoke with patient, all confusion cleared

## 2011-01-02 NOTE — Progress Notes (Signed)
  Subjective:    Patient ID: Edwin [name], male    DOB: 08/30/1940, 70 y.o.   MRN: [id number]  HPI Respiratory tract symptoms Onset/symptoms:1 week ago Exposures (illness/environmental/extrinsic):not sure Progression of symptoms:sputum became yellow Treatments/response:lncreased Prednisone to 20 mg bid from maintenance dose of 1/2 pill qod Present symptoms:productive cough Fever/chills/sweats:no Frontal headache:no Facial pain:no  Nasal purulence:some Sore throat:no Dental pain:no Lymphadenopathy:no Wheezing/shortness of breath:dyspnea is chronic Hemoptysis:no Pleuritic pain:no Associated extrinsic/allergic symptoms:itchy eyes/ sneezing:no Past medical history: Seasonal allergies: no;asthma:minor Smoking history:up to 3 ppd over 28 years           Review of Systems     Objective:   Physical Exam General appearance is of good health and nourishment; no acute distress or increased work of breathing is present.  No  lymphadenopathy about the head, neck, or axilla noted.   Eyes: No conjunctival inflammation or lid edema is present.   Ears:  External ear exam shows no significant lesions or deformities.  Otoscopic examination reveals clear canals, tympanic membranes are intact bilaterally without bulging, retraction, inflammation or discharge.  Nose:  External nasal examination shows no deformity or inflammation. Nasal mucosa are mildly erythematous and dry without lesions or exudates. Septal  Deviation to R.No obstruction to airflow.   Oral exam: Dental hygiene is good; lips and gums are healthy appearing.There is no oropharyngeal erythema or exudate noted.   Heart:  Normal rate and regular rhythm. S1 and S2 normal without gallop, murmur, click, rub .S4  Lungs:Chest SURPRISINGLY  clear to auscultation; no wheezes, rhonchi,rales ,or rubs present.No increased work of breathing. Thick creamy mucus    Extremities:  No cyanosis, edema, or clubbing  noted    Skin: Warm &  dry          Assessment & Plan:  #1 chronic bronchitis with acute flare with purulent sputum  #2 probable component of some sinusitis  Plan: Wean the steroids  back to maintenance dose as instructed.  Levaquin 500 mg daily for 7 days.  Second opinion with pulmonology; possibly   Daliresp  or other therapy may be prophylactic in preventing acute flares.

## 2011-01-02 NOTE — Telephone Encounter (Signed)
If he is supposed to take the prednisone, please call it in to [facility]

## 2011-01-02 NOTE — Patient Instructions (Signed)
Plain Mucinex for thick secretions ;force NON dairy fluids for next 48 hrs. Use a Neti pot daily as needed for sinus congestion   Go to  M.D. for chronic bronchitis information  Prednisone 20 mg twice a day for 5 days max then decrease to  1/2 pill  times a day with meals x5 days; then one half twice a day with meals; then one half daily for 5 days then resume the maintenance prescription.

## 2011-01-08 ENCOUNTER — Encounter: Payer: Self-pay | Admitting: Critical Care Medicine

## 2011-01-08 ENCOUNTER — Other Ambulatory Visit: Payer: Medicare Other

## 2011-01-08 ENCOUNTER — Ambulatory Visit (INDEPENDENT_AMBULATORY_CARE_PROVIDER_SITE_OTHER): Payer: Medicare Other | Admitting: Critical Care Medicine

## 2011-01-08 VITALS — BP 152/88 | HR 68 | Temp 98.2°F | Ht 70.0 in | Wt 210.6 lb

## 2011-01-08 DIAGNOSIS — H409 Unspecified glaucoma: Secondary | ICD-10-CM | POA: Insufficient documentation

## 2011-01-08 DIAGNOSIS — J449 Chronic obstructive pulmonary disease, unspecified: Secondary | ICD-10-CM

## 2011-01-08 DIAGNOSIS — J441 Chronic obstructive pulmonary disease with (acute) exacerbation: Secondary | ICD-10-CM

## 2011-01-08 MED ORDER — ROFLUMILAST 500 MCG PO TABS: 500.0000 ug | ORAL_TABLET | Freq: Every day | ORAL | Status: DC

## 2011-01-08 NOTE — Progress Notes (Signed)
Subjective:    Patient ID: Edwin [name], male    DOB: 1941/04/26, 70 y.o.   MRN: [id number] 70 y.o.WM Copd eval HPI Comments: Dx copd for 71yrs. Smoked 85yrs and quit in 1985   Shortness of Breath This is a chronic problem. The current episode started more than 1 year ago. The problem occurs daily (DOE with any activity such as up incline or stairs,  walking to car and building). The problem has been gradually worsening. Duration: Recovers in a few minutes after stopping. Associated symptoms include chest pain, rhinorrhea, a sore throat and sputum production. Pertinent negatives include no abdominal pain, ear pain, fever, headaches, hemoptysis, leg pain, leg swelling, neck pain, orthopnea, PND, rash, syncope, vomiting or wheezing. The symptoms are aggravated by any activity, eating and lying flat. Associated symptoms comments: Sore throat will ppt each exacerbation. Treatments tried: Pred and ABX and episodes every 7weeks: this helps but then worsens again. His past medical history is significant for asthma and COPD. There is no history of allergies, aspirin allergies, bronchiolitis, CAD, chronic lung disease, DVT, a heart failure, PE or pneumonia. (Dx asthma when younger)  Cough This is a chronic problem. The current episode started more than 1 year ago. The problem has been gradually worsening. The problem occurs constantly (gets better with ABX/pred). The cough is productive of purulent sputum (green to yellow ). Associated symptoms include chest pain, postnasal drip, rhinorrhea, a sore throat and shortness of breath. Pertinent negatives include no chills, ear pain, fever, headaches, hemoptysis, myalgias, rash or wheezing. He has tried oral steroids, ipratropium inhaler and a beta-agonist inhaler (ABX) for the symptoms. The treatment provided moderate relief. His past medical history is significant for asthma, bronchitis, COPD and emphysema. There is no history of bronchiectasis, environmental  allergies or pneumonia. dx asthma when younger       Review of Systems  Constitutional: Positive for fatigue. Negative for fever, chills, diaphoresis, activity change, appetite change and unexpected weight change.  HENT: Positive for congestion, sore throat, rhinorrhea, trouble swallowing, voice change and postnasal drip. Negative for hearing loss, ear pain, nosebleeds, facial swelling, sneezing, mouth sores, neck pain, neck stiffness, dental problem, sinus pressure, tinnitus and ear discharge.   Eyes: Negative for photophobia, discharge, itching and visual disturbance.  Respiratory: Positive for cough, sputum production, chest tightness and shortness of breath. Negative for apnea, hemoptysis, choking, wheezing and stridor.   Cardiovascular: Positive for chest pain. Negative for palpitations, orthopnea, leg swelling, syncope and PND.  Gastrointestinal: Negative for nausea, vomiting, abdominal pain, constipation, blood in stool and abdominal distention.  Genitourinary: Negative for dysuria, urgency, frequency, hematuria, flank pain, decreased urine volume and difficulty urinating.  Musculoskeletal: Positive for joint swelling and arthralgias. Negative for myalgias, back pain and gait problem.  Skin: Negative for color change, pallor and rash.  Neurological: Positive for weakness. Negative for dizziness, tremors, seizures, syncope, speech difficulty, light-headedness, numbness and headaches.  Hematological: Negative for environmental allergies and adenopathy. Does not bruise/bleed easily.  Psychiatric/Behavioral: Negative for confusion, sleep disturbance and agitation. The patient is nervous/anxious.        Objective:   Physical Exam  Filed Vitals:   01/08/11 1137  BP: 152/88  Pulse: 68  Temp: 98.2 F (36.8 C)  TempSrc: Oral  Height: 5\' 10"  (1.778 m)  Weight: 210 lb 9.6 oz (95.528 kg)  SpO2: 97%    Gen: Pleasant, well-nourished, in no distress,  normal affect  ENT: No lesions,   mouth clear,  oropharynx clear, no postnasal  drip  Neck: No JVD, no TMG, no carotid bruits  Lungs: No use of accessory muscles, no dullness to percussion, clear without rales or rhonchi  Cardiovascular: RRR, heart sounds normal, no murmur or gallops, no peripheral edema  Abdomen: soft and NT, no HSM,  BS normal  Musculoskeletal: No deformities, no cyanosis or clubbing  Neuro: alert, non focal  Skin: Warm, no lesions or rashes  3/12:  CHEST - 2 VIEW  Comparison: Chest 03/24/2008. CT chest 12/21/2008.  Findings: The lungs are clear. No pneumothorax or pleural  effusion. Heart size is normal. No focal bony abnormality.  IMPRESSION:  Negative chest.     Assessment & Plan:   CHRONIC OBSTRUCTIVE PULMONARY DISEASE Golds III Copd with obstructive lung disease and recurrent infections, doubt bronchiectasis, with frequent exac good Daliresp candidate Plan Note IgG levels normal Finish Levaquin and prednisone Start Daliresp one daily No other medication changes Overnight sleep oximetry will be checked An alpha one antitrypsin test will be obtained Return 6 weeks       Updated Medication List Outpatient Encounter Prescriptions as of 01/08/2011  Medication Sig Dispense Refill  . albuterol (PROVENTIL HFA) 108 (90 BASE) MCG/ACT inhaler 1-2 puffs every 4 hrs as needed.       [name] Kitchen allopurinol (ZYLOPRIM) 300 MG tablet Take 300 mg by mouth daily.        . bimatoprost (LUMIGAN) 0.03 % ophthalmic solution Place 1 drop into both eyes at bedtime.        . brimonidine (ALPHAGAN P) 0.1 % SOLN 1 drop 2 (two) times daily. Both eyes       . budesonide-formoterol (SYMBICORT) 160-4.5 MCG/ACT inhaler Inhale 2 puffs into the lungs 2 (two) times daily. Spit after use.       [name] Kitchen FLUoxetine (PROZAC) 10 MG capsule TAKE ONE CAPSULE BY MOUTH ONE TIME DAILY  90 capsule  2  . Glucosamine-Chondroit-Vit C-Mn (GLUCOSAMINE 1500 COMPLEX PO) Take 2 tablets by mouth daily.        [name] Kitchen levofloxacin (LEVAQUIN) 500 MG  tablet Take 1 tablet (500 mg total) by mouth daily.  7 tablet  0  . losartan (COZAAR) 100 MG tablet Take 100 mg by mouth daily. inplace of Ramipril       . omeprazole (PRILOSEC) 20 MG capsule Take 20 mg by mouth daily.        . predniSONE (DELTASONE) 20 MG tablet One prednisone twice a day with meals x1 week ; then 1/2 pill 3 times a day with meals x1 week ; then one half twice a day with meals x1 week ; then one half daily until seen.  30 tablet  2  . psyllium (METAMUCIL) 58.6 % powder Take 1 packet by mouth daily.        [name] Kitchen tiotropium (SPIRIVA) 18 MCG inhalation capsule Place 18 mcg into inhaler and inhale daily. Inhale contents of a capsule 30 min after Symbicort       . clotrimazole-betamethasone (LOTRISONE) cream Apply topically 2 (two) times daily.  30 g  0  . roflumilast (DALIRESP) 500 MCG TABS tablet Take 1 tablet (500 mcg total) by mouth daily.  30 tablet  6

## 2011-01-08 NOTE — Patient Instructions (Addendum)
Finish Levaquin and prednisone Start Daliresp one daily No other medication changes Labs today Overnight sleep oximetry will be checked An alpha one antitrypsin test will be obtained Return 6 weeks

## 2011-01-09 NOTE — Progress Notes (Signed)
Quick Note:  Call pt and tell her labs are ok, No change in medications No evidence for immunodeficiency ______

## 2011-01-10 NOTE — Assessment & Plan Note (Addendum)
Golds III Copd with obstructive lung disease and recurrent infections, doubt bronchiectasis, with frequent exac good Daliresp candidate Plan Note IgG levels normal Finish Levaquin and prednisone Start Daliresp one daily No other medication changes Overnight sleep oximetry will be checked An alpha one antitrypsin test will be obtained Return 6 weeks

## 2011-01-10 NOTE — Progress Notes (Signed)
Quick Note:  Called, spoke with pt. He was informed labs are ok and no evidence for immunodeficiency per PW. Advised PW recs no change in meds. Pt verbalized understanding of these results and recs and voiced no further questions at this time. ______

## 2011-01-13 LAB — FUNGAL ANTIBODIES PANEL, ID-BLOOD
Aspergillus Niger Antibodies: NEGATIVE
Aspergillus fumigatus: NEGATIVE
Coccidioides Antibody ID: NEGATIVE
Histoplasma Antibody, ID: NEGATIVE

## 2011-01-13 LAB — HYPERSENSITIVITY PNUEMONITIS PROFILE

## 2011-01-21 ENCOUNTER — Telehealth: Payer: Self-pay | Admitting: Critical Care Medicine

## 2011-01-21 DIAGNOSIS — J449 Chronic obstructive pulmonary disease, unspecified: Secondary | ICD-10-CM

## 2011-01-21 NOTE — Telephone Encounter (Signed)
ONO on RA is normal,  No desaturation.

## 2011-01-31 ENCOUNTER — Telehealth: Payer: Self-pay | Admitting: *Deleted

## 2011-02-01 ENCOUNTER — Other Ambulatory Visit: Payer: Self-pay | Admitting: Internal Medicine

## 2011-02-04 ENCOUNTER — Encounter: Payer: Self-pay | Admitting: Critical Care Medicine

## 2011-02-04 NOTE — Telephone Encounter (Signed)
Called pt's home # - [email or web address] 

## 2011-02-05 NOTE — Telephone Encounter (Signed)
Called, spoke with pt. He is aware alpha 1 test results normal.  He verbalized understanding of this and voiced no further questions/concerns  At this time.

## 2011-02-17 ENCOUNTER — Encounter: Payer: Self-pay | Admitting: Critical Care Medicine

## 2011-02-19 ENCOUNTER — Ambulatory Visit (INDEPENDENT_AMBULATORY_CARE_PROVIDER_SITE_OTHER): Payer: Medicare Other | Admitting: Critical Care Medicine

## 2011-02-19 ENCOUNTER — Other Ambulatory Visit: Payer: Medicare Other

## 2011-02-19 ENCOUNTER — Encounter: Payer: Self-pay | Admitting: Critical Care Medicine

## 2011-02-19 ENCOUNTER — Telehealth: Payer: Self-pay | Admitting: Critical Care Medicine

## 2011-02-19 VITALS — BP 130/70 | HR 74 | Temp 98.9°F | Ht 70.5 in | Wt 205.0 lb

## 2011-02-19 DIAGNOSIS — J440 Chronic obstructive pulmonary disease with acute lower respiratory infection: Secondary | ICD-10-CM

## 2011-02-19 DIAGNOSIS — J449 Chronic obstructive pulmonary disease, unspecified: Secondary | ICD-10-CM

## 2011-02-19 MED ORDER — ZOLPIDEM TARTRATE 10 MG PO TABS: 10.0000 mg | ORAL_TABLET | Freq: Every evening | ORAL | Status: DC | PRN

## 2011-02-19 MED ORDER — PREDNISONE 10 MG PO TABS: ORAL_TABLET | ORAL | Status: DC

## 2011-02-19 MED ORDER — FLUTTER DEVI: Status: DC

## 2011-02-19 MED ORDER — MOXIFLOXACIN HCL 400 MG PO TABS: 400.0000 mg | ORAL_TABLET | Freq: Every day | ORAL | Status: AC

## 2011-02-19 NOTE — Patient Instructions (Signed)
Sputum culture Stop Daliresp Start prednisone 10mg  Take 4 for three days 3 for three days 2 for three days 1 for three days and stop Use flutter valve three times daily Take avelox daily for 5days Stay on Spiriva and Symbicort Return 3 weeks for recheck

## 2011-02-19 NOTE — Progress Notes (Signed)
Subjective:    Patient ID: Edwin [name], male    DOB: 03/29/1941, 70 y.o.   MRN: [id number] 70 y.o.WM Copd eval HPI Comments: Dx copd for 40yrs. Smoked 40yrs and quit in 1985   Shortness of Breath This is a chronic problem. The current episode started more than 1 year ago. The problem occurs daily (DOE with any activity such as up incline or stairs,  walking to car and building). The problem has been gradually worsening. Duration: Recovers in a few minutes after stopping. Associated symptoms include chest pain, rhinorrhea, a sore throat and sputum production. Pertinent negatives include no abdominal pain, ear pain, fever, headaches, hemoptysis, leg pain, leg swelling, neck pain, orthopnea, PND, rash, syncope, vomiting or wheezing. The symptoms are aggravated by any activity, eating and lying flat. Associated symptoms comments: Sore throat will ppt each exacerbation. Treatments tried: Pred and ABX and episodes every 7weeks: this helps but then worsens again. His past medical history is significant for asthma and COPD. There is no history of allergies, aspirin allergies, bronchiolitis, CAD, chronic lung disease, DVT, a heart failure, PE or pneumonia. (Dx asthma when younger)  Cough This is a chronic problem. The current episode started more than 1 year ago. The problem has been gradually worsening. The problem occurs constantly (gets better with ABX/pred). The cough is productive of purulent sputum (green to yellow ). Associated symptoms include chest pain, postnasal drip, rhinorrhea, a sore throat and shortness of breath. Pertinent negatives include no chills, ear pain, fever, headaches, hemoptysis, myalgias, rash or wheezing. He has tried oral steroids, ipratropium inhaler and a beta-agonist inhaler (ABX) for the symptoms. The treatment provided moderate relief. His past medical history is significant for asthma, bronchitis, COPD and emphysema. There is no history of bronchiectasis, environmental  allergies or pneumonia. dx asthma when younger    02/19/2011 No real change.  As went off abx and was ok, within 3days was worse.  Dyspnea the same with activity. Still with mucus and is clear and occ yellow Cannot tolerate daliresp   Review of Systems  Constitutional: Positive for fatigue. Negative for fever, chills, diaphoresis, activity change, appetite change and unexpected weight change.  HENT: Positive for congestion, sore throat, rhinorrhea, trouble swallowing, voice change and postnasal drip. Negative for hearing loss, ear pain, nosebleeds, facial swelling, sneezing, mouth sores, neck pain, neck stiffness, dental problem, sinus pressure, tinnitus and ear discharge.   Eyes: Negative for photophobia, discharge, itching and visual disturbance.  Respiratory: Positive for cough, sputum production, chest tightness and shortness of breath. Negative for apnea, hemoptysis, choking, wheezing and stridor.   Cardiovascular: Positive for chest pain. Negative for palpitations, orthopnea, leg swelling, syncope and PND.  Gastrointestinal: Negative for nausea, vomiting, abdominal pain, constipation, blood in stool and abdominal distention.  Genitourinary: Negative for dysuria, urgency, frequency, hematuria, flank pain, decreased urine volume and difficulty urinating.  Musculoskeletal: Positive for joint swelling and arthralgias. Negative for myalgias, back pain and gait problem.  Skin: Negative for color change, pallor and rash.  Neurological: Positive for weakness. Negative for dizziness, tremors, seizures, syncope, speech difficulty, light-headedness, numbness and headaches.  Hematological: Negative for environmental allergies and adenopathy. Does not bruise/bleed easily.  Psychiatric/Behavioral: Negative for confusion, sleep disturbance and agitation. The patient is nervous/anxious.        Objective:   Physical Exam   Filed Vitals:   02/19/11 0859  BP: 130/70  Pulse: 74  Temp: 98.9 F (37.2  C)  TempSrc: Oral  Height: 5' 10.5" (1.791 m)  Weight: 205 lb (92.987 kg)  SpO2: 97%    Gen: Pleasant, well-nourished, in no distress,  normal affect  ENT: No lesions,  mouth clear,  oropharynx clear, no postnasal drip  Neck: No JVD, no TMG, no carotid bruits  Lungs: No use of accessory muscles, no dullness to percussion, clear without rales or rhonchi  Cardiovascular: RRR, heart sounds normal, no murmur or gallops, no peripheral edema  Abdomen: soft and NT, no HSM,  BS normal  Musculoskeletal: No deformities, no cyanosis or clubbing  Neuro: alert, non focal  Skin: Warm, no lesions or rashes  3/12:  CHEST - 2 VIEW  Comparison: Chest 03/24/2008. CT chest 12/21/2008.  Findings: The lungs are clear. No pneumothorax or pleural  effusion. Heart size is normal. No focal bony abnormality.  IMPRESSION:  Negative chest.     Assessment & Plan:   CHRONIC OBSTRUCTIVE PULMONARY DISEASE Asthmatic bronchitis,  Normal A1AT,  Still steroid dependent, adverse reaction to Daliresp Plan Sputum culture Stop Daliresp Start prednisone 10mg  Take 4 for three days 3 for three days 2 for three days 1 for three days and stop Use flutter valve three times daily Take avelox daily for 5days Stay on Spiriva and Symbicort Return 3 weeks for recheck      Updated Medication List Outpatient Encounter Prescriptions as of 02/19/2011  Medication Sig Dispense Refill  . albuterol (PROVENTIL HFA) 108 (90 BASE) MCG/ACT inhaler 1-2 puffs every 4 hrs as needed.       [name] Kitchen allopurinol (ZYLOPRIM) 300 MG tablet Take 300 mg by mouth daily.        . bimatoprost (LUMIGAN) 0.03 % ophthalmic solution Place 1 drop into both eyes at bedtime.        . brimonidine (ALPHAGAN P) 0.1 % SOLN 1 drop 2 (two) times daily. Both eyes       . clotrimazole-betamethasone (LOTRISONE) cream Apply topically 2 (two) times daily.  30 g  0  . FLUoxetine (PROZAC) 10 MG capsule TAKE ONE CAPSULE BY MOUTH ONE TIME DAILY  90 capsule  2  .  Glucosamine-Chondroit-Vit C-Mn (GLUCOSAMINE 1500 COMPLEX PO) Take 2 tablets by mouth daily.        [name] Kitchen losartan (COZAAR) 100 MG tablet Take 100 mg by mouth daily. inplace of Ramipril       . omeprazole (PRILOSEC) 20 MG capsule Take 20 mg by mouth daily.        . psyllium (METAMUCIL) 58.6 % powder Take 1 packet by mouth daily.        . SYMBICORT 160-4.5 MCG/ACT inhaler INHALE TWO PUFFS BY MOUTH EVERY TWELVE HOURS  1 Inhaler  0  . tiotropium (SPIRIVA) 18 MCG inhalation capsule Place 18 mcg into inhaler and inhale daily. Inhale contents of a capsule 30 min after Symbicort       . DISCONTD: roflumilast (DALIRESP) 500 MCG TABS tablet Take 1 tablet (500 mcg total) by mouth daily.  30 tablet  6  . moxifloxacin (AVELOX) 400 MG tablet Take 1 tablet (400 mg total) by mouth daily.  5 tablet  0  . predniSONE (DELTASONE) 10 MG tablet Take 4 for three days 3 for three days 2 for three days 1 for three days and stop  30 tablet  0  . Respiratory Therapy Supplies (FLUTTER) DEVI Take as directed three times daily  1 each  0  . zolpidem (AMBIEN) 10 MG tablet Take 1 tablet (10 mg total) by mouth at bedtime as needed for sleep.  5 tablet  0  . DISCONTD: predniSONE (DELTASONE) 20 MG tablet One prednisone twice a day with meals x1 week ; then 1/2 pill 3 times a day with meals x1 week ; then one half twice a day with meals x1 week ; then one half daily until seen.  30 tablet  2

## 2011-02-19 NOTE — Telephone Encounter (Signed)
Noted  

## 2011-02-19 NOTE — Assessment & Plan Note (Addendum)
Asthmatic bronchitis,  Normal A1AT,  Still steroid dependent, adverse reaction to Daliresp Plan Sputum culture Stop Daliresp Start prednisone 10mg  Take 4 for three days 3 for three days 2 for three days 1 for three days and stop Use flutter valve three times daily Take avelox daily for 5days Stay on Spiriva and Symbicort Return 3 weeks for recheck

## 2011-02-19 NOTE — Telephone Encounter (Signed)
PT CALLED BACK. HE IS AT THE PHARMACY NOW. I AM GOING TO TRIAGE ROOM TO SEE WHAT CAN BE DONE NOW. Edwin [name]

## 2011-02-19 NOTE — Telephone Encounter (Signed)
I spoke with Dr. Craige Cotta regarding this.  He was ok with changing the avelox to levaquin 500 mg 1 po qd x 5 days.  However, I called [facility] back and spoke with Thayer Ohm, Pharmacist who states pt did not feel like waiting anymore, so he went ahead and purchased the avelox.  Pharmacy will not be able to take the avelox back and give him the levaquin in its place.  I called pt's home # and [email or web address] as a courtesy call to appologize for him having to pay for the avelox and reassure him that we were in the process of working with another doctor to have this medication changed as PW is off this evening.

## 2011-02-21 NOTE — Progress Notes (Signed)
Quick Note:  Call pt and tell him sputum culture does not show any resistent organisms Stay on current meds as Rx ______

## 2011-02-22 LAB — RESPIRATORY CULTURE OR RESPIRATORY AND SPUTUM CULTURE

## 2011-02-25 NOTE — Progress Notes (Signed)
Quick Note:  Called, spoke with pt. I informed him sputum culture does not show any resistant organisms per PW. Advised PW recs he continue with current meds as prescribed. Pt verbalized understanding of these results and recs. ______

## 2011-03-14 ENCOUNTER — Telehealth: Payer: Self-pay | Admitting: Critical Care Medicine

## 2011-03-14 ENCOUNTER — Other Ambulatory Visit: Payer: Self-pay | Admitting: Critical Care Medicine

## 2011-03-14 ENCOUNTER — Encounter: Payer: Self-pay | Admitting: Critical Care Medicine

## 2011-03-14 ENCOUNTER — Ambulatory Visit (INDEPENDENT_AMBULATORY_CARE_PROVIDER_SITE_OTHER): Payer: Medicare Other | Admitting: Critical Care Medicine

## 2011-03-14 ENCOUNTER — Other Ambulatory Visit: Payer: Medicare Other

## 2011-03-14 DIAGNOSIS — J449 Chronic obstructive pulmonary disease, unspecified: Secondary | ICD-10-CM

## 2011-03-14 DIAGNOSIS — J42 Unspecified chronic bronchitis: Secondary | ICD-10-CM

## 2011-03-14 DIAGNOSIS — K219 Gastro-esophageal reflux disease without esophagitis: Secondary | ICD-10-CM

## 2011-03-14 DIAGNOSIS — J441 Chronic obstructive pulmonary disease with (acute) exacerbation: Secondary | ICD-10-CM

## 2011-03-14 DIAGNOSIS — J329 Chronic sinusitis, unspecified: Secondary | ICD-10-CM

## 2011-03-14 DIAGNOSIS — J479 Bronchiectasis, uncomplicated: Secondary | ICD-10-CM

## 2011-03-14 MED ORDER — DEXLANSOPRAZOLE 60 MG PO CPDR: 60.0000 mg | DELAYED_RELEASE_CAPSULE | Freq: Every day | ORAL | Status: DC

## 2011-03-14 NOTE — Telephone Encounter (Signed)
Per Clydie Braun in the [facility], they cannot see the order on 10/11 for BMET. All the blood that was drawn was sent to [facility]. i called [facility] and had them add the BMET and asked that they fax a hard copy of the reuslts. These will then need to be faxed to [facility] in [place]. Left a msg so Rose would be aware.

## 2011-03-14 NOTE — Patient Instructions (Signed)
CT Scan of chest and sinuses will be obtained A bronchoscopy may be needed Stop omeprazole Start Dexilant one daily No other medication changes Follow reflux diet Return 2 months,  I will call sooner with test results and poss bronchoscopy scheduling

## 2011-03-14 NOTE — Progress Notes (Signed)
Subjective:    Patient ID: Edwin [name], male    DOB: 05-Apr-1941, 70 y.o.   MRN: [id number] 70 y.o.WM Copd eval HPI Comments: Dx copd for 71yrs. Smoked 42yrs and quit in 1985    9/17 No real change.  As went off abx and was ok, within 3days was worse.  Dyspnea the same with activity. Still with mucus and is clear and occ yellow Cannot tolerate daliresp  03/15/2011 At last ov we rec: Sputum culture Stop Daliresp Start prednisone 10mg  Take 4 for three days 3 for three days 2 for three days 1 for three days and stop Use flutter valve three times daily Take avelox daily for 5days Stay on Spiriva and Symbicort Pt is a lot better now. Less frequent cough, only 7 times a day some sputum is expectorated.  Clears the throat a lot.   Stays weak,   Is dyspneic with extreme activity. Still on spiriva/symbicort.    Review of Systems  Constitutional: Positive for fatigue. Negative for diaphoresis, activity change, appetite change and unexpected weight change.  HENT: Positive for congestion, trouble swallowing and voice change. Negative for hearing loss, nosebleeds, facial swelling, sneezing, mouth sores, neck stiffness, dental problem, sinus pressure, tinnitus and ear discharge.   Eyes: Negative for photophobia, discharge, itching and visual disturbance.  Respiratory: Positive for chest tightness. Negative for apnea, choking and stridor.   Cardiovascular: Negative for palpitations.  Gastrointestinal: Negative for nausea, constipation, blood in stool and abdominal distention.  Genitourinary: Negative for dysuria, urgency, frequency, hematuria, flank pain, decreased urine volume and difficulty urinating.  Musculoskeletal: Positive for joint swelling and arthralgias. Negative for back pain and gait problem.  Skin: Negative for color change and pallor.  Neurological: Positive for weakness. Negative for dizziness, tremors, seizures, syncope, speech difficulty, light-headedness and numbness.    Hematological: Negative for adenopathy. Does not bruise/bleed easily.  Psychiatric/Behavioral: Negative for confusion, sleep disturbance and agitation. The patient is nervous/anxious.        Objective:   Physical Exam   Filed Vitals:   03/14/11 0907  BP: 140/88  Pulse: 79  Temp: 98.5 F (36.9 C)  TempSrc: Oral  Height: 5\' 10"  (1.778 m)  Weight: 211 lb 3.2 oz (95.8 kg)  SpO2: 98%    Gen: Pleasant, well-nourished, in no distress,  normal affect  ENT: No lesions,  mouth clear,  oropharynx clear, no postnasal drip  Neck: No JVD, no TMG, no carotid bruits  Lungs: No use of accessory muscles, no dullness to percussion, scattered rhonchi Cardiovascular: RRR, heart sounds normal, no murmur or gallops, no peripheral edema  Abdomen: soft and NT, no HSM,  BS normal  Musculoskeletal: No deformities, no cyanosis or clubbing  Neuro: alert, non focal  Skin: Warm, no lesions or rashes  3/12:  CHEST - 2 VIEW  Comparison: Chest 03/24/2008. CT chest 12/21/2008.  Findings: The lungs are clear. No pneumothorax or pleural  effusion. Heart size is normal. No focal bony abnormality.  IMPRESSION:  Negative chest.     Assessment & Plan:   CHRONIC OBSTRUCTIVE PULMONARY DISEASE Ongoing obstruction with mucus plugging. Assoc recurrent infection .  Not A1AT normal, Ig levels normal, Allergy profile normal Plan CT Sinus /chest ,  If bronchiectasis is found , will pursue FOB No change in inhaled or maintenance medications.      Updated Medication List Outpatient Encounter Prescriptions as of 03/14/2011  Medication Sig Dispense Refill  . albuterol (PROVENTIL HFA) 108 (90 BASE) MCG/ACT inhaler 1-2 puffs every 4 hrs  as needed.       [name] Kitchen allopurinol (ZYLOPRIM) 300 MG tablet Take 300 mg by mouth daily.        . bimatoprost (LUMIGAN) 0.03 % ophthalmic solution Place 1 drop into both eyes at bedtime.        . brimonidine (ALPHAGAN P) 0.1 % SOLN 1 drop 2 (two) times daily. Both eyes       .  FLUoxetine (PROZAC) 10 MG capsule TAKE ONE CAPSULE BY MOUTH ONE TIME DAILY  90 capsule  2  . Glucosamine-Chondroit-Vit C-Mn (GLUCOSAMINE 1500 COMPLEX PO) Take 2 tablets by mouth daily.        [name] Kitchen losartan (COZAAR) 100 MG tablet Take 100 mg by mouth daily. inplace of Ramipril       . psyllium (METAMUCIL) 58.6 % powder Take 1 packet by mouth daily.        [name] Kitchen Respiratory Therapy Supplies (FLUTTER) DEVI Take as directed two to three times daily       . SYMBICORT 160-4.5 MCG/ACT inhaler INHALE TWO PUFFS BY MOUTH EVERY TWELVE HOURS  1 Inhaler  0  . tiotropium (SPIRIVA) 18 MCG inhalation capsule Place 18 mcg into inhaler and inhale daily. Inhale contents of a capsule 30 min after Symbicort       . DISCONTD: clotrimazole-betamethasone (LOTRISONE) cream Apply topically 2 (two) times daily.  30 g  0  . DISCONTD: clotrimazole-betamethasone (LOTRISONE) cream Apply topically as needed.        [name] Kitchen DISCONTD: omeprazole (PRILOSEC) 20 MG capsule Take 20 mg by mouth daily.        [name] Kitchen DISCONTD: Respiratory Therapy Supplies (FLUTTER) DEVI Take as directed three times daily  1 each  0  . dexlansoprazole (DEXILANT) 60 MG capsule Take 1 capsule (60 mg total) by mouth daily.  30 capsule  6  . DISCONTD: predniSONE (DELTASONE) 10 MG tablet Take 4 for three days 3 for three days 2 for three days 1 for three days and stop  30 tablet  0  . DISCONTD: zolpidem (AMBIEN) 10 MG tablet Take 1 tablet (10 mg total) by mouth at bedtime as needed for sleep.  5 tablet  0

## 2011-03-15 NOTE — Assessment & Plan Note (Signed)
Ongoing obstruction with mucus plugging. Assoc recurrent infection .  Not A1AT normal, Ig levels normal, Allergy profile normal Plan CT Sinus /chest ,  If bronchiectasis is found , will pursue FOB No change in inhaled or maintenance medications.

## 2011-03-17 ENCOUNTER — Ambulatory Visit (INDEPENDENT_AMBULATORY_CARE_PROVIDER_SITE_OTHER)
Admission: RE | Admit: 2011-03-17 | Discharge: 2011-03-17 | Disposition: A | Discharge status: Home / Self Care | Payer: Medicare Other | Source: Ambulatory Visit | Attending: Critical Care Medicine | Admitting: Critical Care Medicine

## 2011-03-17 ENCOUNTER — Ambulatory Visit (INDEPENDENT_AMBULATORY_CARE_PROVIDER_SITE_OTHER)
Admission: RE | Admit: 2011-03-17 | Discharge: 2011-03-17 | Disposition: A | Discharge status: Home / Self Care | Payer: BC Managed Care – PPO | Source: Ambulatory Visit | Attending: Critical Care Medicine | Admitting: Critical Care Medicine

## 2011-03-17 DIAGNOSIS — J329 Chronic sinusitis, unspecified: Secondary | ICD-10-CM | POA: Diagnosis not present

## 2011-03-17 DIAGNOSIS — J42 Unspecified chronic bronchitis: Secondary | ICD-10-CM

## 2011-03-17 DIAGNOSIS — J479 Bronchiectasis, uncomplicated: Secondary | ICD-10-CM

## 2011-03-17 LAB — BASIC METABOLIC PANEL
BUN: 17 mg/dL (ref 6–23)
Calcium: 9.9 mg/dL (ref 8.4–10.5)
Glucose, Bld: 114 mg/dL — ABNORMAL HIGH (ref 70–99)
Potassium: 4.8 mEq/L (ref 3.5–5.3)
Sodium: 139 mEq/L (ref 135–145)

## 2011-03-17 MED ORDER — IOHEXOL 300 MG/ML  SOLN
80.0000 mL | Freq: Once | INTRAMUSCULAR | Status: AC | PRN
  Administered 2011-03-17: 80 mL via INTRAVENOUS

## 2011-03-17 NOTE — Telephone Encounter (Signed)
LMOMTCB for rose to make her aware

## 2011-03-17 NOTE — Telephone Encounter (Signed)
Rose is aware we will fax bmet results to her once we receive them. I called [facility] to get them to fax over results

## 2011-03-17 NOTE — Telephone Encounter (Signed)
Per lori this has been faxed over and rose is aware. Will sign off message

## 2011-03-17 NOTE — Telephone Encounter (Signed)
I spoke with [facility] and they state this was never added to the order. It showed where Lawson Fiscal did call to add it but [facility] could not explain why this was not done. They state they are adding it to the order now and will run it STAT and then fax the results over to our triage fax #. Will hold until receive fax

## 2011-03-18 LAB — ALLERGY FULL PROFILE
Allergen, D pternoyssinus,d7: <0.1 kU/L (ref <0.35)
Allergen,Goose feathers, e70: <0.1 kU/L (ref <0.35)
Bermuda Grass: <0.1 kU/L (ref <0.35)
Box Elder IgE: <0.1 kU/L (ref <0.35)
Candida Albicans: 0.15 kU/L (ref <0.35)
G005 Rye, Perennial: <0.1 kU/L (ref <0.35)
House Dust Hollister: <0.1 kU/L (ref <0.35)
IgE (Immunoglobulin E), Serum: 99.1 IU/mL (ref 0.0–180.0)
Oak: <0.1 kU/L (ref <0.35)
Stemphylium Botryosum: <0.1 kU/L (ref <0.35)
Timothy Grass: <0.1 kU/L (ref <0.35)

## 2011-03-31 ENCOUNTER — Ambulatory Visit (INDEPENDENT_AMBULATORY_CARE_PROVIDER_SITE_OTHER): Payer: Medicare Other | Admitting: Critical Care Medicine

## 2011-03-31 ENCOUNTER — Encounter: Payer: Self-pay | Admitting: Critical Care Medicine

## 2011-03-31 VITALS — BP 134/86 | HR 80 | Temp 98.1°F | Ht 70.5 in | Wt 212.5 lb

## 2011-03-31 DIAGNOSIS — J209 Acute bronchitis, unspecified: Secondary | ICD-10-CM

## 2011-03-31 DIAGNOSIS — J449 Chronic obstructive pulmonary disease, unspecified: Secondary | ICD-10-CM

## 2011-03-31 MED ORDER — LEVOFLOXACIN 750 MG PO TABS: 750.0000 mg | ORAL_TABLET | Freq: Every day | ORAL | Status: AC

## 2011-03-31 NOTE — Patient Instructions (Signed)
Follow reflux diet Levaquin refilled Rx given, use if secretions worsen and call us that you filled this No other medication changes Return 2 months [place]

## 2011-03-31 NOTE — Assessment & Plan Note (Signed)
Ongoing obstruction with mucus plugging. Assoc recurrent infection .  Not A1AT normal, Ig levels normal, Allergy profile normal CT Chest neg for bronchiectasis,  CT sinus mild max sinusitis  Plan Prn levaquin, pt to call if he fills this No change in inhaled meds Ret 2 months

## 2011-03-31 NOTE — Progress Notes (Signed)
Subjective:    Patient ID: Edwin [name], male    DOB: 1940/07/22, 70 y.o.   MRN: [id number] 70 y.o.WM Copd eval HPI Comments: Dx copd for 28yrs. Smoked 53yrs and quit in 1985    9/17 No real change.  As went off abx and was ok, within 3days was worse.  Dyspnea the same with activity. Still with mucus and is clear and occ yellow Cannot tolerate daliresp  03/14/11 At last ov we rec: Sputum culture Stop Daliresp Start prednisone 10mg  Take 4 for three days 3 for three days 2 for three days 1 for three days and stop Use flutter valve three times daily Take avelox daily for 5days Stay on Spiriva and Symbicort Pt is a lot better now. Less frequent cough, only 7 times a day some sputum is expectorated.  Clears the throat a lot.   Stays weak,   Is dyspneic with extreme activity. Still on spiriva/symbicort.    03/31/2011 Pt last seen 03/14/11: At this ov we rec: Ongoing obstruction with mucus plugging. Assoc recurrent infection .  Not A1AT normal, Ig levels normal, Allergy profile normal Plan CT Sinus /chest ,  If bronchiectasis is found , will pursue FOB No change in inhaled or maintenance medications. The CT Sinus was neg. CT Chest also neg for bronchiectasis  Still coughing some , but is improved, less yellow mucus and feels better.  Next time gets ill will be back to square one. Does not some pn drip.  No real sinus pressure.  DOes the nedipot and is bloody to yellow and will vary.  Review of Systems  Constitutional: Positive for fatigue. Negative for diaphoresis, activity change, appetite change and unexpected weight change.  HENT: Positive for congestion, trouble swallowing and voice change. Negative for hearing loss, nosebleeds, facial swelling, sneezing, mouth sores, neck stiffness, dental problem, sinus pressure, tinnitus and ear discharge.   Eyes: Negative for photophobia, discharge, itching and visual disturbance.  Respiratory: Positive for chest tightness. Negative for  apnea, choking and stridor.   Cardiovascular: Negative for palpitations.  Gastrointestinal: Negative for nausea, constipation, blood in stool and abdominal distention.  Genitourinary: Negative for dysuria, urgency, frequency, hematuria, flank pain, decreased urine volume and difficulty urinating.  Musculoskeletal: Positive for joint swelling and arthralgias. Negative for back pain and gait problem.  Skin: Negative for color change and pallor.  Neurological: Positive for weakness. Negative for dizziness, tremors, seizures, syncope, speech difficulty, light-headedness and numbness.  Hematological: Negative for adenopathy. Does not bruise/bleed easily.  Psychiatric/Behavioral: Negative for confusion, sleep disturbance and agitation. The patient is nervous/anxious.        Objective:   Physical Exam   Filed Vitals:   03/31/11 0920  BP: 134/86  Pulse: 80  Temp: 98.1 F (36.7 C)  TempSrc: Oral  Height: 5' 10.5" (1.791 m)  Weight: 212 lb 8 oz (96.389 kg)  SpO2: 96%    Gen: Pleasant, well-nourished, in no distress,  normal affect  ENT: No lesions,  mouth clear,  oropharynx clear, no postnasal drip  Neck: No JVD, no TMG, no carotid bruits  Lungs: No use of accessory muscles, no dullness to percussion, decreased rhonchi  Cardiovascular: RRR, heart sounds normal, no murmur or gallops, no peripheral edema  Abdomen: soft and NT, no HSM,  BS normal  Musculoskeletal: No deformities, no cyanosis or clubbing  Neuro: alert, non focal  Skin: Warm, no lesions or rashes  3/12:  CHEST - 2 VIEW  Comparison: Chest 03/24/2008. CT chest 12/21/2008.  Findings: The  lungs are clear. No pneumothorax or pleural  effusion. Heart size is normal. No focal bony abnormality.  IMPRESSION:  Negative chest.  10/12 CT Chest Findings: No supraclavicular or axillary adenopathy.  There is no enlarged mediastinal or hilar adenopathy.  No pericardial or pleural effusion identified.  Mild emphysema.    Mild peripheral interstitial reticulation is identified within the  right posterior lung base. No significant bronchiectasis or  honeycombing identified.  There is no airspace consolidation identified. No pulmonary  nodules or pulmonary masses identified.  Limited imaging through the upper abdomen shows diffuse fatty  infiltration of the liver.  Prior cholecystectomy.  Within the left lateral chest wall there is an intramuscular lipoma  which measures 2.0 x 2.5 cm.  IMPRESSION:  1. No acute cardiopulmonary abnormalities.  2. Mild right posterior basilar interstitial reticulation.  3. No evidence for bronchiectasis.  4. Fatty infiltration of the liver.   CT Sinus: mild max sinusitis bilateral     Assessment & Plan:   CHRONIC OBSTRUCTIVE PULMONARY DISEASE Ongoing obstruction with mucus plugging. Assoc recurrent infection .  Not A1AT normal, Ig levels normal, Allergy profile normal CT Chest neg for bronchiectasis,  CT sinus mild max sinusitis  Plan Prn levaquin, pt to call if he fills this No change in inhaled meds Ret 2 months     Updated Medication List Outpatient Encounter Prescriptions as of 03/31/2011  Medication Sig Dispense Refill  . albuterol (PROVENTIL HFA) 108 (90 BASE) MCG/ACT inhaler 1-2 puffs every 4 hrs as needed.       [name] Kitchen allopurinol (ZYLOPRIM) 300 MG tablet Take 300 mg by mouth daily.        . bimatoprost (LUMIGAN) 0.03 % ophthalmic solution Place 1 drop into both eyes at bedtime.        . brimonidine (ALPHAGAN P) 0.1 % SOLN 1 drop 2 (two) times daily. Both eyes       . dexlansoprazole (DEXILANT) 60 MG capsule Take 1 capsule (60 mg total) by mouth daily.  30 capsule  6  . FLUoxetine (PROZAC) 10 MG capsule TAKE ONE CAPSULE BY MOUTH ONE TIME DAILY  90 capsule  2  . Glucosamine-Chondroit-Vit C-Mn (GLUCOSAMINE 1500 COMPLEX PO) Take 2 tablets by mouth daily.        [name] Kitchen losartan (COZAAR) 100 MG tablet Take 100 mg by mouth daily. inplace of Ramipril       . psyllium  (METAMUCIL) 58.6 % powder Take 1 packet by mouth daily.        [name] Kitchen Respiratory Therapy Supplies (FLUTTER) DEVI Take as directed two to three times daily as needed      . SYMBICORT 160-4.5 MCG/ACT inhaler INHALE TWO PUFFS BY MOUTH EVERY TWELVE HOURS  1 Inhaler  0  . tiotropium (SPIRIVA) 18 MCG inhalation capsule Place 18 mcg into inhaler and inhale daily. Inhale contents of a capsule 30 min after Symbicort       . levofloxacin (LEVAQUIN) 750 MG tablet Take 1 tablet (750 mg total) by mouth daily.  7 tablet  4

## 2011-04-06 ENCOUNTER — Other Ambulatory Visit: Payer: Self-pay | Admitting: Internal Medicine

## 2011-04-07 ENCOUNTER — Other Ambulatory Visit: Payer: Self-pay

## 2011-04-07 MED ORDER — BUDESONIDE-FORMOTEROL FUMARATE 160-4.5 MCG/ACT IN AERO: INHALATION_SPRAY | RESPIRATORY_TRACT | Status: DC

## 2011-04-07 NOTE — Telephone Encounter (Signed)
RX sent per patient request.

## 2011-04-09 ENCOUNTER — Ambulatory Visit: Payer: Medicare Other | Admitting: Critical Care Medicine

## 2011-04-23 ENCOUNTER — Telehealth: Payer: Self-pay | Admitting: Critical Care Medicine

## 2011-04-23 NOTE — Telephone Encounter (Signed)
I spoke with pt and he states he was calling to let Dr. Delford Field that he filled his Levaquin last night. Pt states he was coughing up yellow phlem x 2 days. Pt denies any other symptoms. Will forward to Dr. Delford Field so he is aware

## 2011-04-23 NOTE — Telephone Encounter (Signed)
Noted  

## 2011-04-25 NOTE — Telephone Encounter (Signed)
PW is aware that the pt has filled his levaquin.

## 2011-05-22 ENCOUNTER — Ambulatory Visit (INDEPENDENT_AMBULATORY_CARE_PROVIDER_SITE_OTHER): Payer: Medicare Other | Admitting: Critical Care Medicine

## 2011-05-22 ENCOUNTER — Encounter: Payer: Self-pay | Admitting: Critical Care Medicine

## 2011-05-22 DIAGNOSIS — J449 Chronic obstructive pulmonary disease, unspecified: Secondary | ICD-10-CM

## 2011-05-22 MED ORDER — ALBUTEROL SULFATE HFA 108 (90 BASE) MCG/ACT IN AERS: 2.0000 | INHALATION_SPRAY | RESPIRATORY_TRACT | Status: DC | PRN

## 2011-05-22 MED ORDER — LEVOFLOXACIN 750 MG PO TABS: 750.0000 mg | ORAL_TABLET | Freq: Every day | ORAL | Status: AC

## 2011-05-22 NOTE — Assessment & Plan Note (Addendum)
Ongoing obstruction with mucus plugging. Assoc recurrent infection .  Not A1AT normal, Ig levels normal, Allergy profile normal CT Chest neg for bronchiectasis,  CT sinus mild max sinusitis  Note all improved,  Pt with ongoing mucus plugging, but better  Plan Continue inhaled medications as prescribed Rx for refillable levaquin given

## 2011-05-22 NOTE — Patient Instructions (Signed)
No change in medications. Return in           4 months [place] Refill printed for levaquin for as needed use

## 2011-05-22 NOTE — Progress Notes (Signed)
Subjective:    Patient ID: Edwin [name], male    DOB: [birth year], 70 y.o.   MRN: [id number] 70 y.o.WM Copd eval HPI Comments: Dx copd for 21yrs. Smoked 69yrs and quit in 1985    10/29 Pt last seen 03/14/11: At this ov we rec: Ongoing obstruction with mucus plugging. Assoc recurrent infection .  Not A1AT normal, Ig levels normal, Allergy profile normal Plan CT Sinus /chest ,  If bronchiectasis is found , will pursue FOB No change in inhaled or maintenance medications. The CT Sinus was neg. CT Chest also neg for bronchiectasis  Still coughing some , but is improved, less yellow mucus and feels better.  Next time gets ill will be back to square one. Does not some pn drip.  No real sinus pressure.  DOes the nedipot and is bloody to yellow and will vary.  05/22/2011 Since last ov,  Filled levaquin and rx 6weeks ago.  Started more congestion.  This helped.   No real heartburn.  Still with mucus daily. No chest pain.  No edema in feet.   Has some pn drip and uses neti pot. Review of Systems  Constitutional: Positive for fatigue. Negative for diaphoresis, activity change, appetite change and unexpected weight change.  HENT: Positive for congestion, trouble swallowing and voice change. Negative for hearing loss, nosebleeds, facial swelling, sneezing, mouth sores, neck stiffness, dental problem, sinus pressure, tinnitus and ear discharge.   Eyes: Negative for photophobia, discharge, itching and visual disturbance.  Respiratory: Positive for chest tightness and shortness of breath. Negative for apnea, choking and stridor.   Cardiovascular: Negative for palpitations.  Gastrointestinal: Negative for nausea, constipation, blood in stool and abdominal distention.  Genitourinary: Negative for dysuria, urgency, frequency, hematuria, flank pain, decreased urine volume and difficulty urinating.  Musculoskeletal: Positive for joint swelling and arthralgias. Negative for back pain and gait problem.    Skin: Negative for color change and pallor.  Neurological: Negative for dizziness, tremors, seizures, syncope, speech difficulty, weakness, light-headedness and numbness.  Hematological: Negative for adenopathy. Does not bruise/bleed easily.  Psychiatric/Behavioral: Negative for confusion, sleep disturbance and agitation. The patient is nervous/anxious.        Objective:   Physical Exam   Filed Vitals:   05/22/11 0904  BP: 142/96  Pulse: 74  Temp: 98.1 F (36.7 C)  TempSrc: Oral  Height: 5' 10.5" (1.791 m)  Weight: 211 lb (95.709 kg)  SpO2: 95%    Gen: Pleasant, well-nourished, in no distress,  normal affect  ENT: No lesions,  mouth clear,  oropharynx clear, no postnasal drip  Neck: No JVD, no TMG, no carotid bruits  Lungs: No use of accessory muscles, no dullness to percussion, decreased rhonchi  Cardiovascular: RRR, heart sounds normal, no murmur or gallops, no peripheral edema  Abdomen: soft and NT, no HSM,  BS normal  Musculoskeletal: No deformities, no cyanosis or clubbing  Neuro: alert, non focal  Skin: Warm, no lesions or rashes  3/12:  CHEST - 2 VIEW  Comparison: Chest 03/24/2008. CT chest 12/21/2008.  Findings: The lungs are clear. No pneumothorax or pleural  effusion. Heart size is normal. No focal bony abnormality.  IMPRESSION:  Negative chest.  10/12 CT Chest Findings: No supraclavicular or axillary adenopathy.  There is no enlarged mediastinal or hilar adenopathy.  No pericardial or pleural effusion identified.  Mild emphysema.  Mild peripheral interstitial reticulation is identified within the  right posterior lung base. No significant bronchiectasis or  honeycombing identified.  There is no airspace  consolidation identified. No pulmonary  nodules or pulmonary masses identified.  Limited imaging through the upper abdomen shows diffuse fatty  infiltration of the liver.  Prior cholecystectomy.  Within the left lateral chest wall there is an  intramuscular lipoma  which measures 2.0 x 2.5 cm.  IMPRESSION:  1. No acute cardiopulmonary abnormalities.  2. Mild right posterior basilar interstitial reticulation.  3. No evidence for bronchiectasis.  4. Fatty infiltration of the liver.   CT Sinus: mild max sinusitis bilateral     Assessment & Plan:   CHRONIC OBSTRUCTIVE PULMONARY DISEASE Ongoing obstruction with mucus plugging. Assoc recurrent infection .  Not A1AT normal, Ig levels normal, Allergy profile normal CT Chest neg for bronchiectasis,  CT sinus mild max sinusitis  Note all improved,  Pt with ongoing mucus plugging, but better  Plan Continue inhaled medications as prescribed Rx for refillable levaquin given       Updated Medication List Outpatient Encounter Prescriptions as of 05/22/2011  Medication Sig Dispense Refill  . albuterol (PROVENTIL HFA) 108 (90 BASE) MCG/ACT inhaler Inhale 2 puffs into the lungs every 4 (four) hours as needed for wheezing or shortness of breath. 1-2 puffs every 4 hrs as needed.  1 Inhaler  6  . allopurinol (ZYLOPRIM) 300 MG tablet Take 300 mg by mouth daily.        . bimatoprost (LUMIGAN) 0.03 % ophthalmic solution Place 1 drop into both eyes at bedtime.        . brimonidine (ALPHAGAN P) 0.1 % SOLN 1 drop 2 (two) times daily. Both eyes       . budesonide-formoterol (SYMBICORT) 160-4.5 MCG/ACT inhaler 2 (two) times daily. INHALE TWO PUFFS BY MOUTH       . Dexlansoprazole 30 MG capsule Take 60 mg by mouth daily.        [name] Kitchen FLUoxetine (PROZAC) 10 MG capsule TAKE ONE CAPSULE BY MOUTH ONE TIME DAILY  90 capsule  2  . Glucosamine-Chondroit-Vit C-Mn (GLUCOSAMINE 1500 COMPLEX PO) Take 2 tablets by mouth daily.        [name] Kitchen losartan (COZAAR) 100 MG tablet Take 100 mg by mouth daily. inplace of Ramipril       . psyllium (METAMUCIL) 58.6 % powder Take 1 packet by mouth daily.        [name] Kitchen Respiratory Therapy Supplies (FLUTTER) DEVI Take as directed two to three times daily as needed      . tiotropium  (SPIRIVA) 18 MCG inhalation capsule Place 18 mcg into inhaler and inhale daily. Inhale contents of a capsule 30 min after Symbicort       . DISCONTD: albuterol (PROVENTIL HFA) 108 (90 BASE) MCG/ACT inhaler 1-2 puffs every 4 hrs as needed.       [name] Kitchen DISCONTD: budesonide-formoterol (SYMBICORT) 160-4.5 MCG/ACT inhaler INHALE TWO PUFFS BY MOUTH EVERY TWELVE HOURS  1 Inhaler  3  . levofloxacin (LEVAQUIN) 750 MG tablet Take 1 tablet (750 mg total) by mouth daily.  7 tablet  0

## 2011-06-13 ENCOUNTER — Other Ambulatory Visit: Payer: Self-pay | Admitting: Internal Medicine

## 2011-06-15 ENCOUNTER — Other Ambulatory Visit: Payer: Self-pay | Admitting: Internal Medicine

## 2011-07-14 DIAGNOSIS — H4011X0 Primary open-angle glaucoma, stage unspecified: Secondary | ICD-10-CM | POA: Diagnosis not present

## 2011-07-14 DIAGNOSIS — H409 Unspecified glaucoma: Secondary | ICD-10-CM | POA: Diagnosis not present

## 2011-07-14 DIAGNOSIS — H524 Presbyopia: Secondary | ICD-10-CM | POA: Diagnosis not present

## 2011-07-17 ENCOUNTER — Other Ambulatory Visit: Payer: Self-pay | Admitting: Internal Medicine

## 2011-08-06 ENCOUNTER — Other Ambulatory Visit: Payer: Self-pay | Admitting: Internal Medicine

## 2011-08-06 NOTE — Telephone Encounter (Signed)
Prescription sent to pharmacy.

## 2011-08-27 ENCOUNTER — Other Ambulatory Visit: Payer: Self-pay | Admitting: Internal Medicine

## 2011-09-27 ENCOUNTER — Other Ambulatory Visit: Payer: Self-pay | Admitting: Internal Medicine

## 2011-10-10 ENCOUNTER — Other Ambulatory Visit: Payer: Self-pay | Admitting: Critical Care Medicine

## 2011-10-26 ENCOUNTER — Other Ambulatory Visit: Payer: Self-pay | Admitting: Internal Medicine

## 2011-11-07 ENCOUNTER — Other Ambulatory Visit: Payer: Self-pay | Admitting: Internal Medicine

## 2011-11-14 ENCOUNTER — Ambulatory Visit (INDEPENDENT_AMBULATORY_CARE_PROVIDER_SITE_OTHER): Payer: BC Managed Care – PPO | Admitting: Internal Medicine

## 2011-11-14 ENCOUNTER — Ambulatory Visit (INDEPENDENT_AMBULATORY_CARE_PROVIDER_SITE_OTHER)
Admission: RE | Admit: 2011-11-14 | Discharge: 2011-11-14 | Disposition: A | Discharge status: Home / Self Care | Payer: BC Managed Care – PPO | Source: Ambulatory Visit | Attending: Internal Medicine | Admitting: Internal Medicine

## 2011-11-14 ENCOUNTER — Telehealth: Payer: Self-pay | Admitting: Critical Care Medicine

## 2011-11-14 ENCOUNTER — Encounter: Payer: Self-pay | Admitting: Internal Medicine

## 2011-11-14 VITALS — BP 114/82 | HR 81 | Temp 98.2°F | Ht 70.5 in | Wt 212.8 lb

## 2011-11-14 DIAGNOSIS — J449 Chronic obstructive pulmonary disease, unspecified: Secondary | ICD-10-CM | POA: Diagnosis not present

## 2011-11-14 DIAGNOSIS — R042 Hemoptysis: Secondary | ICD-10-CM

## 2011-11-14 DIAGNOSIS — R0602 Shortness of breath: Secondary | ICD-10-CM | POA: Diagnosis not present

## 2011-11-14 DIAGNOSIS — J4489 Other specified chronic obstructive pulmonary disease: Secondary | ICD-10-CM

## 2011-11-14 DIAGNOSIS — R05 Cough: Secondary | ICD-10-CM | POA: Diagnosis not present

## 2011-11-14 DIAGNOSIS — R059 Cough, unspecified: Secondary | ICD-10-CM | POA: Diagnosis not present

## 2011-11-14 MED ORDER — TRAMADOL HCL 50 MG PO TABS: ORAL_TABLET | ORAL | Status: DC

## 2011-11-14 MED ORDER — LEVOFLOXACIN 750 MG PO TABS: 750.0000 mg | ORAL_TABLET | Freq: Every day | ORAL | Status: DC

## 2011-11-14 NOTE — Telephone Encounter (Signed)
Spoke with pt stated that started having fever/chills on Monday,and coughing up dk brown  Blood in the am and then bright red thru out the day . Also c/o chest tightness ,wheezing. Body aches. Pt to see Dr Sherene Sires 11-14-11 at 2:15 pt aware [place] office .

## 2011-11-14 NOTE — Patient Instructions (Addendum)
Take mucinex dm two every 12 hours and supplement if needed with  tramadol 50 mg up to 2 every 4 hours to suppress the urge to cough. Swallowing water or using ice chips/non mint and menthol containing candies (such as lifesavers or sugarless jolly ranchers) are also effective.  You should rest your voice and avoid activities that you know make you cough.  Once you have eliminated the cough for 3 straight days try reducing the tramadol first,  then the  mucinex dm as tolerated.    Levaquin 750 x 5 days  Please remember to go to the   x-ray department downstairs for your tests - we will call you with the results when they are available.  Follow up with Dr Delford Field in 2 weeks.

## 2011-11-14 NOTE — Progress Notes (Signed)
Subjective:    Patient ID: Edwin [name], male    DOB: 02/14/1941    MRN: [id number]    36 yowm quit smoking 1985 with min airflow obstruction GOLD I  01/08/11 followed in pulmonary clinic for recurrent bronchitis.    03/31/11 Pt last seen 03/14/11: At this ov we rec: Ongoing obstruction with mucus plugging. Assoc recurrent infection .  Not A1AT normal, Ig levels normal, Allergy profile normal rec CT chest No change in inhaled or maintenance medications. 1. No acute cardiopulmonary abnormalities.  2. Mild right posterior basilar interstitial reticulation.  3. No evidence for bronchiectasis.  4. Fatty infiltration of the liver.   CT Sinus: mild max sinusitis bilateral    05/22/2011 Since last ov,  Filled levaquin and rx 6weeks ago.  Started more congestion.  This helped.   No real heartburn.  Still with mucus daily. No chest pain.  No edema in feet.   Has some pn drip and uses neti pot. rec No change in medications. Return in           4 [place] Refill printed for levaquin for as needed use   11/14/2011 f/u ov/Edwin [name] cc pt clo coughing up blood x 3 days. Pt states this is about 1/2tsp amt- dark brown-red; mixed in with mucus. Pt states this happens all throughout the [name]. Pt denies any pain in chest. Pt also c/o chest tightness, fatigue, f/c/n/v @ beginning of week.  Mucus is yellow never clears between visits even after levquin though gets lighter.  All started p woke 6/10 and vomited.  Total blood sev tbsp per [name]. Took cipro since 6/11 through 6/13 and chills resolved.   Sleeping ok without nocturnal  or early am exacerbation  of respiratory  c/o's or need for noct saba. Also denies any obvious fluctuation of symptoms with weather or environmental changes or other aggravating or alleviating factors except as outlined above   ROS  At present neg for  any significant sore throat, dysphagia, dental problems, itching, sneezing,  nasal congestion   ear ache,   fever, chills,  sweats, unintended wt loss, pleuritic or exertional cp,  palpitations, orthopnea pnd or leg swelling.  Also denies presyncope, palpitations, heartburn, abdominal pain, anorexia, nausea, vomiting, diarrhea  or change in bowel or urinary habits, change in stools or urine, dysuria,hematuria,  rash, arthralgias, visual complaints, headache, numbness weakness or ataxia or problems with walking or coordination. No noted change in mood/affect or memory.         .        Objective:   Physical Exam  Wt Readings from Last 3 Encounters:  11/14/11 212 lb 12.8 oz (96.525 kg)  05/22/11 211 lb (95.709 kg)  03/31/11 212 lb 8 oz (96.389 kg)      Gen: Pleasant, well-nourished, in no distress,  normal affect  ENT: No lesions,  mouth clear,  oropharynx clear, no postnasal drip  Neck: No JVD, no TMG, no carotid bruits  Lungs: No use of accessory muscles, no dullness to percussion, min bilateral rhonchi  Cardiovascular: RRR, heart sounds normal, no murmur or gallops, no peripheral edema  Abdomen: soft and NT, no HSM,  BS normal  Musculoskeletal: No deformities, no cyanosis or clubbing  Neuro: alert, no deficits  Skin: Warm, no lesions or rashes   cxr 11/14/11 Minimal right lower lobe atelectasis or scarring again noted.         Assessment & Plan:

## 2011-11-14 NOTE — Progress Notes (Deleted)
Subjective:    Patient ID: Edwin [name], male    DOB: 1940-09-02, 71 y.o.   MRN: [id number] 71 y.o.WM Copd eval HPI Comments: Dx copd for 23yrs. Smoked 47yrs and quit in 1985    10/29 Pt last seen 03/14/11: At this ov we rec: Ongoing obstruction with mucus plugging. Assoc recurrent infection .  Not A1AT normal, Ig levels normal, Allergy profile normal Plan CT Sinus /chest ,  If bronchiectasis is found , will pursue FOB No change in inhaled or maintenance medications. The CT Sinus was neg. CT Chest also neg for bronchiectasis  Still coughing some , but is improved, less yellow mucus and feels better.  Next time gets ill will be back to square one. Does not some pn drip.  No real sinus pressure.  DOes the nedipot and is bloody to yellow and will vary.  11/14/2011 Since last ov,  Filled levaquin and rx 6weeks ago.  Started more congestion.  This helped.   No real heartburn.  Still with mucus daily. No chest pain.  No edema in feet.   Has some pn drip and uses neti pot. Review of Systems  Constitutional: Positive for fatigue. Negative for diaphoresis, activity change, appetite change and unexpected weight change.  HENT: Positive for congestion, trouble swallowing and voice change. Negative for hearing loss, nosebleeds, facial swelling, sneezing, mouth sores, neck stiffness, dental problem, sinus pressure, tinnitus and ear discharge.   Eyes: Negative for photophobia, discharge, itching and visual disturbance.  Respiratory: Positive for chest tightness and shortness of breath. Negative for apnea, choking and stridor.   Cardiovascular: Negative for palpitations.  Gastrointestinal: Negative for nausea, constipation, blood in stool and abdominal distention.  Genitourinary: Negative for dysuria, urgency, frequency, hematuria, flank pain, decreased urine volume and difficulty urinating.  Musculoskeletal: Positive for joint swelling and arthralgias. Negative for back pain and gait problem.    Skin: Negative for color change and pallor.  Neurological: Negative for dizziness, tremors, seizures, syncope, speech difficulty, weakness, light-headedness and numbness.  Hematological: Negative for adenopathy. Does not bruise/bleed easily.  Psychiatric/Behavioral: Negative for confusion, disturbed wake/sleep cycle and agitation. The patient is nervous/anxious.        Objective:   Physical Exam  Filed Vitals:   11/14/11 1427  BP: 114/82  Pulse: 81  Temp: 98.2 F (36.8 C)  TempSrc: Oral  Height: 5' 10.5" (1.791 m)  Weight: 212 lb 12.8 oz (96.525 kg)  SpO2: 95%    Gen: Pleasant, well-nourished, in no distress,  normal affect  ENT: No lesions,  mouth clear,  oropharynx clear, no postnasal drip  Neck: No JVD, no TMG, no carotid bruits  Lungs: No use of accessory muscles, no dullness to percussion, decreased rhonchi  Cardiovascular: RRR, heart sounds normal, no murmur or gallops, no peripheral edema  Abdomen: soft and NT, no HSM,  BS normal  Musculoskeletal: No deformities, no cyanosis or clubbing  Neuro: alert, non focal  Skin: Warm, no lesions or rashes  3/12:  CHEST - 2 VIEW  Comparison: Chest 03/24/2008. CT chest 12/21/2008.  Findings: The lungs are clear. No pneumothorax or pleural  effusion. Heart size is normal. No focal bony abnormality.  IMPRESSION:  Negative chest.  10/12 CT Chest Findings: No supraclavicular or axillary adenopathy.  There is no enlarged mediastinal or hilar adenopathy.  No pericardial or pleural effusion identified.  Mild emphysema.  Mild peripheral interstitial reticulation is identified within the  right posterior lung base. No significant bronchiectasis or  honeycombing identified.  There is  no airspace consolidation identified. No pulmonary  nodules or pulmonary masses identified.  Limited imaging through the upper abdomen shows diffuse fatty  infiltration of the liver.  Prior cholecystectomy.  Within the left lateral chest  wall there is an intramuscular lipoma  which measures 2.0 x 2.5 cm.  IMPRESSION:  1. No acute cardiopulmonary abnormalities.  2. Mild right posterior basilar interstitial reticulation.  3. No evidence for bronchiectasis.  4. Fatty infiltration of the liver.   CT Sinus: mild max sinusitis bilateral     Assessment & Plan:   No problem-specific assessment & plan notes found for this encounter.   Updated Medication List Outpatient Encounter Prescriptions as of 11/14/2011  Medication Sig Dispense Refill  . albuterol (PROVENTIL HFA) 108 (90 BASE) MCG/ACT inhaler Inhale 2 puffs into the lungs every 4 (four) hours as needed for wheezing or shortness of breath. 1-2 puffs every 4 hrs as needed.  1 Inhaler  6  . allopurinol (ZYLOPRIM) 300 MG tablet TAKE 1 TABLET BY MOUTH DAILY ONCE GOUT RESOLVED FOR AT LEAST 1  WEEK  90 tablet  0  . bimatoprost (LUMIGAN) 0.03 % ophthalmic solution Place 1 drop into both eyes at bedtime.        . brimonidine (ALPHAGAN P) 0.1 % SOLN 1 drop 2 (two) times daily. Both eyes       . DEXILANT 60 MG capsule TAKE ONE CAPSULE BY MOUTH ONE TIME DAILY  30 capsule  3  . Dexlansoprazole 30 MG capsule Take 60 mg by mouth daily.        [name] Kitchen FLUoxetine (PROZAC) 10 MG capsule TAKE ONE CAPSULE BY MOUTH ONE TIME DAILY  90 capsule  1  . Glucosamine-Chondroit-Vit C-Mn (GLUCOSAMINE 1500 COMPLEX PO) Take 2 tablets by mouth daily.        [name] Kitchen losartan (COZAAR) 100 MG tablet TAKE ONE TABLET BY MOUTH ONE TIME DAILY  30 tablet  10  . psyllium (METAMUCIL) 58.6 % powder Take 1 packet by mouth daily.        [name] Kitchen Respiratory Therapy Supplies (FLUTTER) DEVI Take as directed two to three times daily as needed      . SPIRIVA HANDIHALER 18 MCG inhalation capsule INHALE ONE CAPSULE BY MOUTH EVERY MORNING 30 MINUTES AFTER SYMBICORT  30 each  2  . SYMBICORT 160-4.5 MCG/ACT inhaler INHALE TWO PUFFS BY MOUTH EVERY TWELVE HOURS  1 Inhaler  1  . DISCONTD: budesonide-formoterol (SYMBICORT) 160-4.5 MCG/ACT inhaler 2  (two) times daily. INHALE TWO PUFFS BY MOUTH

## 2011-11-15 DIAGNOSIS — R042 Hemoptysis: Secondary | ICD-10-CM | POA: Insufficient documentation

## 2011-11-15 NOTE — Assessment & Plan Note (Addendum)
-   new onset 11/10/11 with no acute changes on cxr 11/14/11   Assoc with flare of bronchitis but note his chronically discolored sputum never resolves suggesting ? MAI  For now try mucinex and suppress excess cough with tramadol  I had an extended discussion with the patient today lasting 15 to 20 minutes of a 25 minute visit  Re management of hemoptysis and need for f/u

## 2011-11-15 NOTE — Assessment & Plan Note (Signed)
PFTs 01/08/11 GOLD I min airflow obstruction ONO 8/12: normal , no desaturation 03/2011 CT chest neg bronchiectasis CT sinus: mild maxillary sinusitis  Persistent discolored sputum with new onset hemoptysis though very low grade ? Could he have MAI? rec levaquin and f/u with Dr Delford Field to regroup and ? Need for fob.

## 2011-11-17 NOTE — Progress Notes (Signed)
Quick Note:  Spoke with pt and notified of results per Dr. Wert. Pt verbalized understanding and denied any questions.  ______ 

## 2011-11-25 ENCOUNTER — Ambulatory Visit (INDEPENDENT_AMBULATORY_CARE_PROVIDER_SITE_OTHER): Payer: BC Managed Care – PPO | Admitting: Adult Health

## 2011-11-25 ENCOUNTER — Encounter: Payer: Self-pay | Admitting: Adult Health

## 2011-11-25 VITALS — BP 140/90 | HR 79 | Temp 98.0°F | Ht 70.5 in | Wt 213.4 lb

## 2011-11-25 DIAGNOSIS — J4489 Other specified chronic obstructive pulmonary disease: Secondary | ICD-10-CM

## 2011-11-25 DIAGNOSIS — J449 Chronic obstructive pulmonary disease, unspecified: Secondary | ICD-10-CM

## 2011-11-25 NOTE — Progress Notes (Signed)
Subjective:    Patient ID: Edwin [name], male    DOB: 1940-07-28    MRN: [id number] 41 yowm quit smoking 1985 with min airflow obstruction GOLD I  01/08/11 followed in pulmonary clinic for recurrent bronchitis.   03/31/11 Pt last seen 03/14/11: At this ov we rec: Ongoing obstruction with mucus plugging. Assoc recurrent infection .  Not A1AT normal, Ig levels normal, Allergy profile normal rec CT chest No change in inhaled or maintenance medications. 1. No acute cardiopulmonary abnormalities.  2. Mild right posterior basilar interstitial reticulation.  3. No evidence for bronchiectasis.  4. Fatty infiltration of the liver.   CT Sinus: mild max sinusitis bilateral    05/22/2011 Since last ov,  Filled levaquin and rx 6weeks ago.  Started more congestion.  This helped.   No real heartburn.  Still with mucus daily. No chest pain.  No edema in feet.   Has some pn drip and uses neti pot. rec No change in medications. Return in           4 months [place] Refill printed for levaquin for as needed use   11/14/2011 f/u ov/Wert cc pt clo coughing up blood x 3 days. Pt states this is about 1/2tsp amt- dark brown-red; mixed in with mucus. Pt states this happens all throughout the day. Pt denies any pain in chest. Pt also c/o chest tightness, fatigue, f/c/n/v @ beginning of week.  Mucus is yellow never clears between visits even after levquin though gets lighter.  All started p woke 6/10 and vomited.  Total blood sev tbsp per day. Took cipro since 6/11 through 6/13 and chills resolved.  >tx w/ Levaquin , xray with chronic changes   11/25/2011 Follow up  Pt returns for follow up  Seen last ov with acute bronchitis w/ hemoptysis.  CXR w/ chronic changes.  He was tx w/ levaquin  Feeling much better .  Still has some congestion -clear.  hemotpysis resolved totally, no reoccurence.       ROS:  Constitutional:   No  weight loss, night sweats,  Fevers, chills, fatigue, or   lassitude.  HEENT:   No headaches,  Difficulty swallowing,  Tooth/dental problems, or  Sore throat,                No sneezing, itching, ear ache,  +nasal congestion, post nasal drip,   CV:  No chest pain,  Orthopnea, PND, swelling in lower extremities, anasarca, dizziness, palpitations, syncope.   GI  No heartburn, indigestion, abdominal pain, nausea, vomiting, diarrhea, change in bowel habits, loss of appetite, bloody stools.   Resp: No shortness of breath with exertion or at rest.  No coughing up of blood.  No change in color of mucus.  No wheezing.  No chest wall deformity  Skin: no rash or lesions.  GU: no dysuria, change in color of urine, no urgency or frequency.  No flank pain, no hematuria   MS:  No joint pain or swelling.  No decreased range of motion.  No back pain.  Psych:  No change in mood or affect. No depression or anxiety.  No memory loss.         .        Objective:   Physical Exam     Gen: Pleasant, well-nourished, in no distress,  normal affect  ENT: No lesions,  mouth clear,  oropharynx clear, no postnasal drip  Neck: No JVD, no TMG, no carotid bruits  Lungs: No use  of accessory muscles, no dullness to percussion, coarse BS   Cardiovascular: RRR, heart sounds normal, no murmur or gallops, no peripheral edema  Abdomen: soft and NT, no HSM,  BS normal  Musculoskeletal: No deformities, no cyanosis or clubbing  Neuro: alert, no deficits  Skin: Warm, no lesions or rashes   cxr 11/14/11 Minimal right lower lobe atelectasis or scarring again noted.         Assessment & Plan:

## 2011-11-25 NOTE — Patient Instructions (Addendum)
Continue on current regimen .  follow up Dr. Wright  In 3 months and As needed   

## 2011-11-25 NOTE — Assessment & Plan Note (Signed)
Recent flare with hemoptysis  Now resolved  Cont on current regimen.

## 2011-12-21 ENCOUNTER — Other Ambulatory Visit: Payer: Self-pay | Admitting: Internal Medicine

## 2011-12-22 NOTE — Telephone Encounter (Signed)
Uric Acid Level ( DX:Gout) 

## 2012-01-07 DIAGNOSIS — H5200 Hypermetropia, unspecified eye: Secondary | ICD-10-CM | POA: Diagnosis not present

## 2012-01-07 DIAGNOSIS — H4011X0 Primary open-angle glaucoma, stage unspecified: Secondary | ICD-10-CM | POA: Diagnosis not present

## 2012-01-07 DIAGNOSIS — H52229 Regular astigmatism, unspecified eye: Secondary | ICD-10-CM | POA: Diagnosis not present

## 2012-01-07 DIAGNOSIS — H524 Presbyopia: Secondary | ICD-10-CM | POA: Diagnosis not present

## 2012-01-16 ENCOUNTER — Other Ambulatory Visit: Payer: Self-pay | Admitting: Internal Medicine

## 2012-01-26 ENCOUNTER — Other Ambulatory Visit: Payer: Self-pay | Admitting: Internal Medicine

## 2012-01-26 NOTE — Telephone Encounter (Signed)
Uric Arid (995.20)

## 2012-02-10 ENCOUNTER — Other Ambulatory Visit: Payer: Self-pay | Admitting: Critical Care Medicine

## 2012-02-19 ENCOUNTER — Ambulatory Visit (INDEPENDENT_AMBULATORY_CARE_PROVIDER_SITE_OTHER): Payer: Medicare Other | Admitting: Critical Care Medicine

## 2012-02-19 ENCOUNTER — Encounter: Payer: Self-pay | Admitting: Critical Care Medicine

## 2012-02-19 VITALS — BP 150/80 | HR 70 | Temp 98.5°F | Ht 70.5 in | Wt 214.0 lb

## 2012-02-19 DIAGNOSIS — Z23 Encounter for immunization: Secondary | ICD-10-CM

## 2012-02-19 DIAGNOSIS — R042 Hemoptysis: Secondary | ICD-10-CM | POA: Diagnosis not present

## 2012-02-19 DIAGNOSIS — J449 Chronic obstructive pulmonary disease, unspecified: Secondary | ICD-10-CM | POA: Diagnosis not present

## 2012-02-19 DIAGNOSIS — J4489 Other specified chronic obstructive pulmonary disease: Secondary | ICD-10-CM

## 2012-02-19 MED ORDER — LEVOFLOXACIN 500 MG PO TABS: 500.0000 mg | ORAL_TABLET | Freq: Every day | ORAL | Status: DC

## 2012-02-19 NOTE — Patient Instructions (Addendum)
Levaquin Rx given, call if you use this Call if you bleed again.  No other med changes Return 4 months

## 2012-02-19 NOTE — Assessment & Plan Note (Signed)
Bronchiectasis is not present the patient does have asthmatic bronchitis with chronic lower airway inflammation now improved Plan Maintain inhaled medications as prescribed

## 2012-02-19 NOTE — Assessment & Plan Note (Signed)
Offices has resolved and was due to lower airway inflammation. There is no evidence of Mycobacterium infection on current chest x-ray or exam. Plan Maintain inhaled medications as prescribed

## 2012-02-19 NOTE — Progress Notes (Signed)
Subjective:    Patient ID: Edwin [name], male    DOB: 1941-04-26, 71 y.o.   MRN: [id number]  HPI 14 yowm quit smoking 1985 with min airflow obstruction GOLD I  01/08/11 followed in pulmonary clinic for recurrent bronchitis.   03/31/11 Pt last seen 03/14/11: At this ov we rec: Ongoing obstruction with mucus plugging. Assoc recurrent infection .  Not A1AT normal, Ig levels normal, Allergy profile normal rec CT chest No change in inhaled or maintenance medications. 1. No acute cardiopulmonary abnormalities.  2. Mild right posterior basilar interstitial reticulation.  3. No evidence for bronchiectasis.  4. Fatty infiltration of the liver.   CT Sinus: mild max sinusitis bilateral    05/22/2011 Since last ov,  Filled levaquin and rx 6weeks ago.  Started more congestion.  This helped.   No real heartburn.  Still with mucus daily. No chest pain.  No edema in feet.   Has some pn drip and uses neti pot. rec No change in medications. Return in           [place] printed for levaquin for as needed use   11/14/2011 f/u ov/Wert cc pt clo coughing up blood x 3 days. Pt states this is about 1/2tsp amt- dark brown-red; mixed in with mucus. Pt states this happens all throughout the day. Pt denies any pain in chest. Pt also c/o chest tightness, fatigue, f/c/n/v @ beginning of week.  Mucus is yellow never clears between visits even after levquin though gets lighter.  All started p woke 6/10 and vomited.  Total blood sev tbsp per day. Took cipro since 6/11 through 6/13 and chills resolved.  >tx w/ Levaquin , xray with chronic changes   11/25/2011 Follow up  Pt returns for follow up  Seen last ov with acute bronchitis w/ hemoptysis.  CXR w/ chronic changes.  He was tx w/ levaquin  Feeling much better .  Still has some congestion -clear.  hemotpysis resolved totally, no reoccurence.   02/19/2012 No further recurrence after 6/13 flare.   Pt notes 4-5x clear to light yellow.  Pt  still dyspneic.  No real chest pain.  No f/c/s.  No edema in feet.  No qhs cough.  Pt with more gas and refluxes into chest. Dysphagia is better.  On Dexilant.    Review of Systems 11 pt ROS neg except as above    Objective:   Physical Exam  Filed Vitals:   02/19/12 0943  BP: 150/80  Pulse: 70  Temp: 98.5 F (36.9 C)  TempSrc: Oral  Height: 5' 10.5" (1.791 m)  Weight: 214 lb (97.07 kg)  SpO2: 96%    Gen: Pleasant, well-nourished, in no distress,  normal affect  ENT: No lesions,  mouth clear,  oropharynx clear, no postnasal drip  Neck: No JVD, no TMG, no carotid bruits  Lungs: No use of accessory muscles, no dullness to percussion, clear without rales or rhonchi  Cardiovascular: RRR, heart sounds normal, no murmur or gallops, no peripheral edema  Abdomen: soft and NT, no HSM,  BS normal  Musculoskeletal: No deformities, no cyanosis or clubbing  Neuro: alert, non focal  Skin: Warm, no lesions or rashes  No results found.       Assessment & Plan:   Hemoptysis Offices has resolved and was due to lower airway inflammation. There is no evidence of Mycobacterium infection on current chest x-ray or exam. Plan Maintain inhaled medications as prescribed  CHRONIC OBSTRUCTIVE PULMONARY DISEASE Bronchiectasis  is not present the patient does have asthmatic bronchitis with chronic lower airway inflammation now improved Plan Maintain inhaled medications as prescribed   Updated Medication List Outpatient Encounter Prescriptions as of 02/19/2012  Medication Sig Dispense Refill  . albuterol (PROVENTIL HFA) 108 (90 BASE) MCG/ACT inhaler Inhale 2 puffs into the lungs every 4 (four) hours as needed for wheezing or shortness of breath. 1-2 puffs every 4 hrs as needed.  1 Inhaler  6  . allopurinol (ZYLOPRIM) 300 MG tablet       . bimatoprost (LUMIGAN) 0.03 % ophthalmic solution Place 1 drop into both eyes at bedtime.        . brimonidine (ALPHAGAN P) 0.1 % SOLN 1 drop 2 (two)  times daily. Both eyes       . clotrimazole-betamethasone (LOTRISONE) cream       . DEXILANT 60 MG capsule TAKE ONE CAPSULE BY MOUTH ONE TIME DAILY  30 capsule  2  . FLUoxetine (PROZAC) 10 MG capsule TAKE ONE CAPSULE BY MOUTH ONE TIME DAILY  90 capsule  1  . Glucosamine-Chondroit-Vit C-Mn (GLUCOSAMINE 1500 COMPLEX PO) Take 2 tablets by mouth daily.        [name] Kitchen losartan (COZAAR) 100 MG tablet TAKE ONE TABLET BY MOUTH ONE TIME DAILY  30 tablet  10  . psyllium (METAMUCIL) 58.6 % powder Take 1 packet by mouth daily.        [name] Kitchen Respiratory Therapy Supplies (FLUTTER) DEVI Take as directed two to three times daily as needed      . SPIRIVA HANDIHALER 18 MCG inhalation capsule INHALE ONE CAPSULE BY MOUTH EVERY MORNING 30 MINUTES AFTER SYMBICORT  30 each  2  . SYMBICORT 160-4.5 MCG/ACT inhaler INHALE TWO PUFFS BY MOUTH EVERY TWELVE HOURS  1 Inhaler  1  . traMADol (ULTRAM) 50 MG tablet 1-2 every 4 hours as needed for cough or pain      . DISCONTD: allopurinol (ZYLOPRIM) 300 MG tablet TAKE 1 TABLET BY MOUTH DAILY  ONCE GOUT RESOLVED FOR AT LEAST 1  WEEK  30 tablet  0  . DISCONTD: clotrimazole-betamethasone (LOTRISONE) cream APPLY TO AFFECTED AREA TWICE DAILY  45 g  1  . levofloxacin (LEVAQUIN) 500 MG tablet Take 1 tablet (500 mg total) by mouth daily.  7 tablet  1  . DISCONTD: Dexlansoprazole 30 MG capsule Take 60 mg by mouth daily.

## 2012-02-24 ENCOUNTER — Other Ambulatory Visit: Payer: Self-pay | Admitting: Internal Medicine

## 2012-02-24 NOTE — Telephone Encounter (Signed)
Rx sent.    MW 

## 2012-03-02 ENCOUNTER — Other Ambulatory Visit: Payer: Self-pay | Admitting: Internal Medicine

## 2012-03-02 MED ORDER — TIOTROPIUM BROMIDE MONOHYDRATE 18 MCG IN CAPS: 18.0000 ug | ORAL_CAPSULE | Freq: Every day | RESPIRATORY_TRACT | Status: DC

## 2012-03-02 NOTE — Telephone Encounter (Signed)
RX sent

## 2012-03-02 NOTE — Telephone Encounter (Signed)
Uric Acid 995.20 

## 2012-03-02 NOTE — Telephone Encounter (Signed)
Ok to refill  X 6  

## 2012-03-02 NOTE — Telephone Encounter (Signed)
refill spirva handihlr cap BOEH--last fill 8.26.13 #30 --last ov 8.2.12, Inhale One Capsule by mouth every morning 30-minutes after symbicort

## 2012-04-01 ENCOUNTER — Ambulatory Visit (INDEPENDENT_AMBULATORY_CARE_PROVIDER_SITE_OTHER): Payer: Medicare Other | Admitting: Critical Care Medicine

## 2012-04-01 ENCOUNTER — Encounter: Payer: Self-pay | Admitting: Critical Care Medicine

## 2012-04-01 VITALS — BP 146/86 | HR 94 | Temp 98.6°F | Ht 70.0 in | Wt 212.0 lb

## 2012-04-01 DIAGNOSIS — J449 Chronic obstructive pulmonary disease, unspecified: Secondary | ICD-10-CM

## 2012-04-01 MED ORDER — MOXIFLOXACIN HCL 400 MG PO TABS: 400.0000 mg | ORAL_TABLET | Freq: Every day | ORAL | Status: DC

## 2012-04-01 MED ORDER — TIOTROPIUM BROMIDE MONOHYDRATE 18 MCG IN CAPS: 18.0000 ug | ORAL_CAPSULE | Freq: Every day | RESPIRATORY_TRACT | Status: DC

## 2012-04-01 MED ORDER — PREDNISONE 10 MG PO TABS: ORAL_TABLET | ORAL | Status: DC

## 2012-04-01 NOTE — Progress Notes (Signed)
Subjective:    Patient ID: Edwin [name], male    DOB: 31-Oct-1940, 71 y.o.   MRN: [id number]  HPI  71 y.o. WM quit smoking 1985 with min airflow obstruction GOLD A  01/08/11 followed in pulmonary clinic for recurrent bronchitis.   03/31/11 Pt last seen 03/14/11: At this ov we rec: Ongoing obstruction with mucus plugging. Assoc recurrent infection .  Not A1AT normal, Ig levels normal, Allergy profile normal rec CT chest No change in inhaled or maintenance medications. 1. No acute cardiopulmonary abnormalities.  2. Mild right posterior basilar interstitial reticulation.  3. No evidence for bronchiectasis.  4. Fatty infiltration of the liver.   CT Sinus: mild max sinusitis bilateral    05/22/2011 Since last ov,  Filled levaquin and rx 6weeks ago.  Started more congestion.  This helped.   No real heartburn.  Still with mucus daily. No chest pain.  No edema in feet.   Has some pn drip and uses neti pot. rec No change in medications. Return in           4 [place] Refill printed for levaquin for as needed use   11/14/2011 f/u ov/Wert cc pt clo coughing up blood x 3 days. Pt states this is about 1/2tsp amt- dark brown-red; mixed in with mucus. Pt states this happens all throughout the day. Pt denies any pain in chest. Pt also c/o chest tightness, fatigue, f/c/n/v @ beginning of week.  Mucus is yellow never clears between visits even after levquin though gets lighter.  All started p woke 6/10 and vomited.  Total blood sev tbsp per day. Took cipro since 6/11 through 6/13 and chills resolved.  >tx w/ Levaquin , xray with chronic changes   11/25/2011 Follow up  Pt returns for follow up  Seen last ov with acute bronchitis w/ hemoptysis.  CXR w/ chronic changes.  He was tx w/ levaquin  Feeling much better .  Still has some congestion -clear.  hemotpysis resolved totally, no reoccurence.   02/19/2012 No further recurrence after 6/13 flare.   Pt notes 4-5x clear to light yellow.   Pt still dyspneic.  No real chest pain.  No f/c/s.  No edema in feet.  No qhs cough.  Pt with more gas and refluxes into chest. Dysphagia is better.  On Dexilant.    04/01/2012 Pt started levaquin x 7days.  Pt is slowly better but still coughing and congested and worse supine. No real wheeze.  Pt is dyspneic with exertion.  No real fever.  No edema in feet.  Notes some pndrip No hemoptysis.  No chest pain, just tight.    Review of Systems  11 pt ROS neg except as above    Objective:   Physical Exam   Filed Vitals:   04/01/12 1426  BP: 146/86  Pulse: 94  Temp: 98.6 F (37 C)  TempSrc: Oral  Height: 5\' 10"  (1.778 m)  Weight: 212 lb (96.163 kg)  SpO2: 95%    Gen: Pleasant, well-nourished, in no distress,  normal affect  ENT: No lesions,  mouth clear,  oropharynx clear, no postnasal drip  Neck: No JVD, no TMG, no carotid bruits  Lungs: No use of accessory muscles, no dullness to percussion, expired wheezes and scattered rhonchi  Cardiovascular: RRR, heart sounds normal, no murmur or gallops, no peripheral edema  Abdomen: soft and NT, no HSM,  BS normal  Musculoskeletal: No deformities, no cyanosis or clubbing  Neuro: alert, non focal  Skin:  Warm, no lesions or rashes  No results found.       Assessment & Plan:   CHRONIC OBSTRUCTIVE PULMONARY DISEASE Gold stage A  With recurrent tracheobronchitis Plan Start prednisone 10mg  Take 4 for three days 3 for three days 2 for three days 1 for three days and stop Start Avelox one daily for 5 days Both above sent to pharmacy downstairs Refill spiriva sent to [facility] pharmacy Stay on spiriva and symbicort Return 6 weeks for recheck     Updated Medication List Outpatient Encounter Prescriptions as of 04/01/2012  Medication Sig Dispense Refill  . albuterol (PROVENTIL HFA) 108 (90 BASE) MCG/ACT inhaler Inhale 2 puffs into the lungs every 4 (four) hours as needed for wheezing or shortness of breath. 1-2 puffs every 4 hrs  as needed.  1 Inhaler  6  . allopurinol (ZYLOPRIM) 300 MG tablet TAKE 1 TABLET BY MOUTH DAILY  ONCE GOUT RESOLVED FOR AT LEAST 1  WEEK  30 tablet  0  . bimatoprost (LUMIGAN) 0.03 % ophthalmic solution Place 1 drop into both eyes at bedtime.        . brimonidine (ALPHAGAN P) 0.1 % SOLN 1 drop 2 (two) times daily. Both eyes       . DEXILANT 60 MG capsule TAKE ONE CAPSULE BY MOUTH ONE TIME DAILY  30 capsule  2  . FLUoxetine (PROZAC) 10 MG capsule TAKE ONE CAPSULE BY MOUTH ONE TIME DAILY  90 capsule  1  . Glucosamine-Chondroit-Vit C-Mn (GLUCOSAMINE 1500 COMPLEX PO) Take 2 tablets by mouth daily.        [name] Kitchen levofloxacin (LEVAQUIN) 500 MG tablet Take 1 tablet (500 mg total) by mouth daily.  7 tablet  1  . losartan (COZAAR) 100 MG tablet TAKE ONE TABLET BY MOUTH ONE TIME DAILY  30 tablet  10  . psyllium (METAMUCIL) 58.6 % powder Take 1 packet by mouth daily.        [name] Kitchen Respiratory Therapy Supplies (FLUTTER) DEVI Take as directed two to three times daily as needed      . SYMBICORT 160-4.5 MCG/ACT inhaler INHALE TWO PUFFS BY MOUTH EVERY TWELVE HOURS  1 Inhaler  1  . tiotropium (SPIRIVA HANDIHALER) 18 MCG inhalation capsule Place 1 capsule (18 mcg total) into inhaler and inhale daily.  30 capsule  11  . traMADol (ULTRAM) 50 MG tablet 1-2 every 4 hours as needed for cough or pain      . DISCONTD: tiotropium (SPIRIVA HANDIHALER) 18 MCG inhalation capsule Place 1 capsule (18 mcg total) into inhaler and inhale daily.  30 capsule  6  . moxifloxacin (AVELOX) 400 MG tablet Take 1 tablet (400 mg total) by mouth daily.  5 tablet  0  . predniSONE (DELTASONE) 10 MG tablet Take 4 for three days 3 for three days 2 for three days 1 for three days and stop  30 tablet  0  . DISCONTD: allopurinol (ZYLOPRIM) 300 MG tablet       . DISCONTD: clotrimazole-betamethasone (LOTRISONE) cream

## 2012-04-01 NOTE — Patient Instructions (Addendum)
Start prednisone 10mg  Take 4 for three days 3 for three days 2 for three days 1 for three days and stop Start Avelox one daily for 5 days Both above sent to pharmacy downstairs Refill spiriva sent to [facility] pharmacy Stay on spiriva and symbicort Return 6 weeks for recheck

## 2012-04-01 NOTE — Assessment & Plan Note (Signed)
Gold stage A  With recurrent tracheobronchitis Plan Start prednisone 10mg  Take 4 for three days 3 for three days 2 for three days 1 for three days and stop Start Avelox one daily for 5 days Both above sent to pharmacy downstairs Refill spiriva sent to [facility] Stay on spiriva and symbicort Return 6 weeks for recheck

## 2012-04-19 ENCOUNTER — Telehealth: Payer: Self-pay | Admitting: Internal Medicine

## 2012-04-19 MED ORDER — BUDESONIDE-FORMOTEROL FUMARATE 160-4.5 MCG/ACT IN AERO: INHALATION_SPRAY | RESPIRATORY_TRACT | Status: DC

## 2012-04-19 NOTE — Telephone Encounter (Signed)
RX sent

## 2012-04-19 NOTE — Telephone Encounter (Signed)
Refill: Symbicort 160-4.5 aer. Inhale two puffs by mouth every twelve hours. Qty 11. Last fill 01-26-12

## 2012-04-26 ENCOUNTER — Encounter: Payer: Self-pay | Admitting: Pulmonary Disease

## 2012-04-26 ENCOUNTER — Telehealth: Payer: Self-pay | Admitting: Critical Care Medicine

## 2012-04-26 ENCOUNTER — Other Ambulatory Visit: Payer: Medicare Other

## 2012-04-26 ENCOUNTER — Ambulatory Visit (INDEPENDENT_AMBULATORY_CARE_PROVIDER_SITE_OTHER): Payer: Medicare Other | Admitting: Pulmonary Disease

## 2012-04-26 ENCOUNTER — Ambulatory Visit (INDEPENDENT_AMBULATORY_CARE_PROVIDER_SITE_OTHER)
Admission: RE | Admit: 2012-04-26 | Discharge: 2012-04-26 | Disposition: A | Discharge status: Home / Self Care | Payer: Medicare Other | Source: Ambulatory Visit | Attending: Pulmonary Disease | Admitting: Pulmonary Disease

## 2012-04-26 VITALS — BP 130/90 | HR 81 | Temp 98.8°F | Ht 70.5 in | Wt 212.2 lb

## 2012-04-26 DIAGNOSIS — R05 Cough: Secondary | ICD-10-CM | POA: Diagnosis not present

## 2012-04-26 DIAGNOSIS — J449 Chronic obstructive pulmonary disease, unspecified: Secondary | ICD-10-CM

## 2012-04-26 DIAGNOSIS — R079 Chest pain, unspecified: Secondary | ICD-10-CM | POA: Diagnosis not present

## 2012-04-26 DIAGNOSIS — R0602 Shortness of breath: Secondary | ICD-10-CM | POA: Diagnosis not present

## 2012-04-26 DIAGNOSIS — R059 Cough, unspecified: Secondary | ICD-10-CM | POA: Diagnosis not present

## 2012-04-26 DIAGNOSIS — J4489 Other specified chronic obstructive pulmonary disease: Secondary | ICD-10-CM

## 2012-04-26 MED ORDER — ALBUTEROL SULFATE HFA 108 (90 BASE) MCG/ACT IN AERS: 2.0000 | INHALATION_SPRAY | RESPIRATORY_TRACT | Status: DC | PRN

## 2012-04-26 MED ORDER — LEVOFLOXACIN 500 MG PO TABS: 500.0000 mg | ORAL_TABLET | Freq: Every day | ORAL | Status: DC

## 2012-04-26 MED ORDER — PREDNISONE 10 MG PO TABS: ORAL_TABLET | ORAL | Status: DC

## 2012-04-26 NOTE — Assessment & Plan Note (Signed)
Favor acute bronchitis flare - probably needs longer course of antibiotcs  levaquin 500 x 10 ds - call if sputum still green Prednisone 10 mg tabs  Take 2 tabs daily with food x 5ds, then 1 tab daily with food x 5ds then STOP Probiotic  Check sputum culture Chest xray does not show infx Stay on mucinex twice daily Albuterol inhaler sample - 2puffs every 6h as needed only

## 2012-04-26 NOTE — Progress Notes (Signed)
  Subjective:    Patient ID: Edwin [name], male    DOB: 1941-04-13, 71 y.o.   MRN: [id number]  HPI 71 y.o. WM quit smoking 1985 with min airflow obstruction GOLD A 01/08/11 followed in pulmonary clinic for recurrent bronchitis.   A1AT normal, Ig levels normal, Allergy profile normal  CT chest 10/12  Mild right posterior basilar interstitial reticulation. No evidence for bronchiectasis.  CT Sinus: mild max sinusitis bilateral  On Dexilant empiric for GERD   04/26/2012 Avelox + prednisone taper - last visit 04/01/12 - felt better while on it then right back 2 wks ago  pt reports cough w/ yellow w/ occasional brown phlem, chest congestion, worn out, wheezing when lying down, increase in SOB-getting worse since Last Wed. saturday night had chills/fever. Cough wears him out- green phlegm  Past Medical History  Diagnosis Date  . Glaucoma(365)   . Gout   . Hyperlipidemia     NMR lipoprofile 2005: LDL 113(1416/825), HLD 37, TG 190.  [name] Kitchen Hypertension   . COPD (chronic obstructive pulmonary disease)     reactive airway disease  . Skin cancer     hx of basal cell x 1, 2003, Dr.Patseavours  . Hx of colonic polyps      Review of Systems neg for any significant sore throat, dysphagia, itching, sneezing, nasal congestion or excess/ purulent secretions, fever, chills, sweats, unintended wt loss, pleuritic or exertional cp, hempoptysis, orthopnea pnd or change in chronic leg swelling. Also denies presyncope, palpitations, heartburn, abdominal pain, nausea, vomiting, diarrhea or change in bowel or urinary habits, dysuria,hematuria, rash, arthralgias, visual complaints, headache, numbness weakness or ataxia.     Objective:   Physical Exam   Gen. Pleasant, well-nourished, in no distress, normal affect ENT - no lesions, no post nasal drip Neck: No JVD, no thyromegaly, no carotid bruits Lungs: no use of accessory muscles, no dullness to percussion, rt basal rales, no rhonchi  Cardiovascular:  Rhythm regular, heart sounds  normal, no murmurs or gallops, no peripheral edema Abdomen: soft and non-tender, no hepatosplenomegaly, BS normal. Musculoskeletal: No deformities, no cyanosis or clubbing Neuro:  alert, non focal        Assessment & Plan:

## 2012-04-26 NOTE — Patient Instructions (Signed)
levaquin 500 x 10 ds - call if sputum still green Prednisone 10 mg tabs  Take 2 tabs daily with food x 5ds, then 1 tab daily with food x 5ds then STOP  Check sputum culture Chest xray today Stay on mucinex twice daily Albuterol inhaler sample - 2puffs every 6h as needed only

## 2012-04-26 NOTE — Telephone Encounter (Signed)
Called, spoke with pt.  C/o prod cough with yellow and brown mucus, increased SOB at rest and with exertion, wheezing when laying down, and some chest tightness x 3 wks.  Pt has OV with RA today at 11:15 am -- pt will keep this appt.

## 2012-04-26 NOTE — Telephone Encounter (Signed)
Pt scheduled to see dr Vassie Loll today.Edwin [name]

## 2012-04-30 NOTE — Progress Notes (Signed)
i gave pt results by phone. Pt to stay on levaquin

## 2012-05-04 LAB — RESPIRATORY CULTURE OR RESPIRATORY AND SPUTUM CULTURE

## 2012-05-05 ENCOUNTER — Telehealth: Payer: Self-pay | Admitting: Critical Care Medicine

## 2012-05-05 MED ORDER — GEMIFLOXACIN MESYLATE 320 MG PO TABS: ORAL_TABLET | ORAL | Status: DC

## 2012-05-05 NOTE — Telephone Encounter (Signed)
Call in Factive 320mg  daily x 5 days

## 2012-05-05 NOTE — Telephone Encounter (Signed)
Pt states he finished 10 day course of Levaquin this morning but still has yellow/green mucus with his cough. Dr. Vassie Loll had told the pt to call if his sputum remained discolored after abx. He says he is probably 60 - 70% better and wants to know if he should have a few more days on the abx. PW, pls advise. Allergies  Allergen Reactions  . Ramipril     REACTION: chest congestion

## 2012-05-05 NOTE — Telephone Encounter (Signed)
Factive 320mg  x5days has been sent to verified pharmacy.  Patient made aware and nothing further needed.

## 2012-05-06 ENCOUNTER — Other Ambulatory Visit: Payer: Self-pay | Admitting: Internal Medicine

## 2012-05-07 ENCOUNTER — Other Ambulatory Visit: Payer: Self-pay | Admitting: Critical Care Medicine

## 2012-05-13 ENCOUNTER — Ambulatory Visit: Payer: Medicare Other | Admitting: Critical Care Medicine

## 2012-05-13 ENCOUNTER — Encounter: Payer: Self-pay | Admitting: Internal Medicine

## 2012-05-13 ENCOUNTER — Ambulatory Visit (INDEPENDENT_AMBULATORY_CARE_PROVIDER_SITE_OTHER): Payer: Medicare Other | Admitting: Internal Medicine

## 2012-05-13 VITALS — BP 132/78 | HR 66 | Temp 98.0°F | Resp 12 | Ht 70.5 in | Wt 215.8 lb

## 2012-05-13 DIAGNOSIS — Z0000 Encounter for general adult medical examination without abnormal findings: Secondary | ICD-10-CM | POA: Diagnosis not present

## 2012-05-13 DIAGNOSIS — Z86010 Personal history of colonic polyps: Secondary | ICD-10-CM

## 2012-05-13 DIAGNOSIS — I10 Essential (primary) hypertension: Secondary | ICD-10-CM

## 2012-05-13 DIAGNOSIS — R7309 Other abnormal glucose: Secondary | ICD-10-CM

## 2012-05-13 DIAGNOSIS — E785 Hyperlipidemia, unspecified: Secondary | ICD-10-CM

## 2012-05-13 DIAGNOSIS — N429 Disorder of prostate, unspecified: Secondary | ICD-10-CM | POA: Diagnosis not present

## 2012-05-13 DIAGNOSIS — M109 Gout, unspecified: Secondary | ICD-10-CM | POA: Diagnosis not present

## 2012-05-13 LAB — CBC WITH DIFFERENTIAL/PLATELET
Basophils Relative: 0.5 % (ref 0.0–3.0)
Eosinophils Absolute: 0.1 10*3/uL (ref 0.0–0.7)
Eosinophils Relative: 1.7 % (ref 0.0–5.0)
HCT: 45 % (ref 39.0–52.0)
Lymphs Abs: 2.2 10*3/uL (ref 0.7–4.0)
MCHC: 33.9 g/dL (ref 30.0–36.0)
MCV: 93.9 fl (ref 78.0–100.0)
Monocytes Absolute: 0.7 10*3/uL (ref 0.1–1.0)
Neutro Abs: 4.9 10*3/uL (ref 1.4–7.7)
RBC: 4.78 Mil/uL (ref 4.22–5.81)
WBC: 8 10*3/uL (ref 4.5–10.5)

## 2012-05-13 LAB — LIPID PANEL
Cholesterol: 172 mg/dL (ref 0–200)
LDL Cholesterol: 102 mg/dL — ABNORMAL HIGH (ref 0–99)
Triglycerides: 119 mg/dL (ref 0.0–149.0)

## 2012-05-13 LAB — HEPATIC FUNCTION PANEL
ALT: 30 U/L (ref 0–53)
AST: 30 U/L (ref 0–37)
Albumin: 4.5 g/dL (ref 3.5–5.2)
Total Bilirubin: 0.9 mg/dL (ref 0.3–1.2)
Total Protein: 7.5 g/dL (ref 6.0–8.3)

## 2012-05-13 LAB — BASIC METABOLIC PANEL
CO2: 27 mEq/L (ref 19–32)
Chloride: 100 mEq/L (ref 96–112)
Creatinine, Ser: 1.1 mg/dL (ref 0.4–1.5)
Potassium: 3.9 mEq/L (ref 3.5–5.1)

## 2012-05-13 LAB — URIC ACID: Uric Acid, Serum: 7.1 mg/dL (ref 4.0–7.8)

## 2012-05-13 MED ORDER — LOSARTAN POTASSIUM 100 MG PO TABS: 100.0000 mg | ORAL_TABLET | Freq: Every day | ORAL | Status: DC

## 2012-05-13 MED ORDER — ALLOPURINOL 300 MG PO TABS: 300.0000 mg | ORAL_TABLET | Freq: Every day | ORAL | Status: DC

## 2012-05-13 NOTE — Progress Notes (Signed)
Subjective:    Patient ID: Edwin [name], male    DOB: Jul 08, 1940, 71 y.o.   MRN: [id number]  HPI  Medicare Wellness Visit:  The following psychosocial & medical history were reviewed as required by Medicare.   Social history: caffeine: 1 cup coffee & 1 soda , alcohol: 6-8 / week ,  tobacco use : quit 1985  & exercise : "zero" due to pulmonary issues.   Home & personal  safety / fall risk: no issues, activities of daily living: no limitations , seatbelt use : yes , and smoke alarm employment :yes .  Power of Attorney/Living Will status : in place  Vision ( as recorded per Nurse) & Hearing  evaluation :  Ophth exam 9/13. Hearing exam < 12 mos ago. Orientation :oriented X 3 , memory & recall :good,  math testing:good,and mood & affect : normal . Depression / anxiety:denied Travel history : [place] 2011 , immunization status :up to date , transfusion history:  no, and preventive health surveillance ( colonoscopies, BMD , etc as per protocol/ SOC): ? Colonoscopy due , Dental care:  Every 4 mos . Chart reviewed &  Updated. Active issues reviewed & addressed.   Review of Systems Past medical history/family history/social history were all reviewed and updated. Dr Delford Field has treated him for  recurrent COPD flares and "pneumonia". This was based on his sputum culture. Chest x-ray apparently did not show any pneumonia.  He does have scant yellow sputum at this time. His dyspnea is stable but does preclude regular exercise. He denies fever, chills, or sweats.  He describes intermittent urinary obstructive symptoms but not sustained     Objective:   Physical Exam  Gen.:  well-nourished in appearance. Alert, appropriate and cooperative throughout exam. Head: Normocephalic without obvious abnormalities  Eyes: No corneal or conjunctival inflammation noted. Pterygium OS. Extraocular motion intact.  Ears: External  ear exam reveals no significant lesions or deformities. Canals clear .TMs normal.  Nose:  External nasal exam reveals no deformity or inflammation. Nasal mucosa are pink and moist. No lesions or exudates noted.  Mouth: Oral mucosa and oropharynx reveal no lesions or exudates. Teeth in good repair. Neck: No deformities, masses, or tenderness noted. Range of motion & Thyroid normal. Lungs: Normal respiratory effort; chest expands symmetrically. Lungs are clear to auscultation without rales, wheezes, or increased work of breathing. Heart: Normal rate and rhythm. Normal S1 and S2. No gallop, click, or rub. S4 w/o murmur. Abdomen: Bowel sounds normal; abdomen soft and nontender. No masses, organomegaly or hernias noted. Genitalia/DRE:Genitalia normal except for left varices. Prostate is symmetrically enlarged without definite induration or nodularity Musculoskeletal/extremities: No deformity or scoliosis noted of  the thoracic or lumbar spine. No clubbing, cyanosis, edema, or deformity noted. Range of motion  normal .Tone & strength  normal.Joints normal. Nail health  good. Vascular: Carotid, radial artery, dorsalis pedis and  posterior tibial pulses are full and equal. No bruits present. Neurologic: Alert and oriented x3. Deep tendon reflexes symmetrical and normal.          Skin: Intact without suspicious lesions or rashes. Lymph: No cervical, axillary, or inguinal lymphadenopathy present. Psych: Mood and affect are normal. Normally interactive  Assessment & Plan:  #1 Medicare Wellness Exam; criteria met ; data entered #2 Problem List reviewed ; Assessment/ Recommendations made Plan: see Orders

## 2012-05-13 NOTE — Patient Instructions (Addendum)
Blood Pressure Goal  Ideally is an AVERAGE < 135/85. This AVERAGE should be calculated from @ least 5-7 BP readings taken @ different times of day on different days of week. You should not respond to isolated BP readings , but rather the AVERAGE for that week As per the Standard of Care , screening Colonoscopy recommended @ 50 & every 5-10 years thereafter . More frequent monitor would be dictated by family history or findings @ Colonoscopy.   If you activate My Chart; the results can be released to you as soon as they populate from the lab. If you choose not to use this program; the labs have to be reviewed, copied & mailed   causing a delay in getting the results to you.

## 2012-05-14 ENCOUNTER — Telehealth: Payer: Self-pay | Admitting: Critical Care Medicine

## 2012-05-14 NOTE — Telephone Encounter (Signed)
Patient returning call.

## 2012-05-14 NOTE — Telephone Encounter (Signed)
Pt saw PW 10.31.13 for sick visit, follow up in 6 weeks >> NS for 12.12.13 ov in [place].  Pt saw RA 11.25.13 for sick visit, follow up in 3 months >> recall set for 2.23.14.  LMOM TCB x1.

## 2012-05-14 NOTE — Telephone Encounter (Signed)
Pt called back and has scheduled appt with PW on 12-20 at 11:45 for a follow up.  Nothing further is needed.

## 2012-05-21 ENCOUNTER — Encounter: Payer: Self-pay | Admitting: Critical Care Medicine

## 2012-05-21 ENCOUNTER — Ambulatory Visit (INDEPENDENT_AMBULATORY_CARE_PROVIDER_SITE_OTHER): Payer: Medicare Other | Admitting: Critical Care Medicine

## 2012-05-21 VITALS — BP 130/84 | HR 67 | Temp 98.5°F | Ht 70.5 in | Wt 217.0 lb

## 2012-05-21 DIAGNOSIS — J449 Chronic obstructive pulmonary disease, unspecified: Secondary | ICD-10-CM | POA: Diagnosis not present

## 2012-05-21 DIAGNOSIS — J019 Acute sinusitis, unspecified: Secondary | ICD-10-CM

## 2012-05-21 DIAGNOSIS — J209 Acute bronchitis, unspecified: Secondary | ICD-10-CM

## 2012-05-21 MED ORDER — MOMETASONE FUROATE 50 MCG/ACT NA SUSP: 2.0000 | Freq: Every day | NASAL | Status: DC

## 2012-05-21 MED ORDER — AMOXICILLIN-POT CLAVULANATE 875-125 MG PO TABS: 1.0000 | ORAL_TABLET | Freq: Two times a day (BID) | ORAL | Status: DC

## 2012-05-21 MED ORDER — PREDNISONE 10 MG PO TABS: ORAL_TABLET | ORAL | Status: DC

## 2012-05-21 NOTE — Assessment & Plan Note (Signed)
Recurrent sinusitis with AB flare Plan [facility] Sinus rinse twice daily for 7days Augmentin 875mg  twice daily for 14days Prednisone 10mg  Take 4 for three days 3 for three days 2 for three days 1 for three days and stop Nasonex two puff each nostril daily Return 6 weeks

## 2012-05-21 NOTE — Patient Instructions (Addendum)
[facility] Sinus rinse twice daily for 7days Augmentin 875mg  twice daily for 14days Prednisone 10mg  Take 4 for three days 3 for three days 2 for three days 1 for three days and stop Nasonex two puff each nostril daily Return 6 weeks

## 2012-05-21 NOTE — Progress Notes (Signed)
Subjective:    Patient ID: Edwin [name], male    DOB: 07-11-1940, 71 y.o.   MRN: [id number]  HPI  71 y.o. WM quit smoking 1985 with min airflow obstruction GOLD A 01/08/11 followed in pulmonary clinic for recurrent bronchitis.   A1AT normal, Ig levels normal, Allergy profile normal  CT chest 10/12  Mild right posterior basilar interstitial reticulation. No evidence for bronchiectasis.  CT Sinus: mild max sinusitis bilateral  On Dexilant empiric for GERD   11/25 Avelox + prednisone taper - last visit 04/01/12 - felt better while on it then right back 2 wks ago  pt reports cough w/ yellow w/ occasional brown phlem, chest congestion, worn out, wheezing when lying down, increase in SOB-getting worse since Last Wed. saturday night had chills/fever. Cough wears him out- green phlegm  05/21/2012 Seen 11/25 Rx levaquin and pred. Culture pos strep pneumoniae Cough is still present. Still with yellow mucus. Remains hoarse.  When coughs hard time raising mucus.  Notes dyspnea with activity.  No sinus issues.      Past Medical History  Diagnosis Date  . Glaucoma(365)   . Gout   . Hyperlipidemia     NMR lipoprofile 2005: LDL 113(1416/825), HLD 37, TG 190.  [name] Kitchen Hypertension   . COPD (chronic obstructive pulmonary disease)     reactive airway disease  . Skin cancer     hx of basal cell x 1, 2003, Dr.Patseavours  . Hx of colonic polyps     Dr Kinnie Scales     Review of Systems  neg for any significant sore throat, dysphagia, itching, sneezing, nasal congestion or excess/ purulent secretions, fever, chills, sweats, unintended wt loss, pleuritic or exertional cp, hempoptysis, orthopnea pnd or change in chronic leg swelling. Also denies presyncope, palpitations, heartburn, abdominal pain, nausea, vomiting, diarrhea or change in bowel or urinary habits, dysuria,hematuria, rash, arthralgias, visual complaints, headache, numbness weakness or ataxia.     Objective:   Physical Exam  Filed Vitals:    05/21/12 1153  BP: 130/84  Pulse: 67  Temp: 98.5 F (36.9 C)  TempSrc: Oral  Height: 5' 10.5" (1.791 m)  Weight: 217 lb (98.431 kg)  SpO2: 96%    Gen: Pleasant, well-nourished, in no distress,  normal affect  ENT: No lesions,  mouth clear,  oropharynx clear, +++ postnasal drip, nasal purulence  Neck: No JVD, no TMG, no carotid bruits  Lungs: No use of accessory muscles, no dullness to percussion, clear without rales or rhonchi  Cardiovascular: RRR, heart sounds normal, no murmur or gallops, no peripheral edema  Abdomen: soft and NT, no HSM,  BS normal  Musculoskeletal: No deformities, no cyanosis or clubbing  Neuro: alert, non focal  Skin: Warm, no lesions or rashes  No results found.      Assessment & Plan:   Acute sinusitis with symptoms > 10 days Acute on chronic sinusitis with strep pneumoniae as an isolate Plan [facility] Sinus rinse twice daily for 7days Augmentin 875mg  twice daily for 14days Prednisone 10mg  Take 4 for three days 3 for three days 2 for three days 1 for three days and stop Nasonex two puff each nostril daily Return 6 weeks   Chronic obstructive lung disease, gold stage A Recurrent sinusitis with AB flare Plan [facility] Sinus rinse twice daily for 7days Augmentin 875mg  twice daily for 14days Prednisone 10mg  Take 4 for three days 3 for three days 2 for three days 1 for three days and stop Nasonex two puff each nostril daily  Return 6 weeks    Updated Medication List Outpatient Encounter Prescriptions as of 05/21/2012  Medication Sig Dispense Refill  . albuterol (PROVENTIL HFA) 108 (90 BASE) MCG/ACT inhaler Inhale 2 puffs into the lungs every 4 (four) hours as needed for wheezing or shortness of breath. 1-2 puffs every 4 hrs as needed.  1 Inhaler  0  . allopurinol (ZYLOPRIM) 300 MG tablet Take 1 tablet (300 mg total) by mouth daily.  90 tablet  3  . bimatoprost (LUMIGAN) 0.03 % ophthalmic solution Place 1 drop into both eyes at bedtime.         . brimonidine (ALPHAGAN P) 0.1 % SOLN 1 drop 2 (two) times daily. Both eyes       . budesonide-formoterol (SYMBICORT) 160-4.5 MCG/ACT inhaler INHALE TWO PUFFS BY MOUTH EVERY TWELVE HOURS  1 Inhaler  3  . dexlansoprazole (DEXILANT) 60 MG capsule Take 1 capsule (60 mg total) by mouth daily.  30 capsule  5  . FLUoxetine (PROZAC) 10 MG capsule TAKE ONE CAPSULE BY MOUTH ONE TIME DAILY  90 capsule  1  . Glucosamine-Chondroit-Vit C-Mn (GLUCOSAMINE 1500 COMPLEX PO) Take 2 tablets by mouth daily.        [name] Kitchen losartan (COZAAR) 100 MG tablet Take 1 tablet (100 mg total) by mouth daily.  90 tablet  3  . psyllium (METAMUCIL) 58.6 % powder Take 1 packet by mouth daily.        [name] Kitchen Respiratory Therapy Supplies (FLUTTER) DEVI Take as directed two to three times daily as needed      . tiotropium (SPIRIVA HANDIHALER) 18 MCG inhalation capsule Place 1 capsule (18 mcg total) into inhaler and inhale daily.  30 capsule  11  . traMADol (ULTRAM) 50 MG tablet 1-2 every 4 hours as needed for cough or pain      . amoxicillin-clavulanate (AUGMENTIN) 875-125 MG per tablet Take 1 tablet by mouth 2 (two) times daily.  28 tablet  0  . mometasone (NASONEX) 50 MCG/ACT nasal spray Place 2 sprays into the nose daily.  17 g  6  . predniSONE (DELTASONE) 10 MG tablet Take 4 for three days 3 for three days 2 for three days 1 for three days and stop  30 tablet  0

## 2012-05-21 NOTE — Assessment & Plan Note (Signed)
Acute on chronic sinusitis with strep pneumoniae as an isolate Plan [facility] Sinus rinse twice daily for 7days Augmentin 875mg  twice daily for 14days Prednisone 10mg  Take 4 for three days 3 for three days 2 for three days 1 for three days and stop Nasonex two puff each nostril daily Return 6 weeks

## 2012-05-27 ENCOUNTER — Encounter: Payer: Self-pay | Admitting: Emergency Medicine

## 2012-05-27 ENCOUNTER — Ambulatory Visit (INDEPENDENT_AMBULATORY_CARE_PROVIDER_SITE_OTHER): Payer: Medicare Other | Admitting: Emergency Medicine

## 2012-05-27 ENCOUNTER — Telehealth: Payer: Self-pay | Admitting: Critical Care Medicine

## 2012-05-27 VITALS — BP 128/78 | HR 73 | Temp 98.3°F | Ht 70.5 in | Wt 220.0 lb

## 2012-05-27 DIAGNOSIS — J019 Acute sinusitis, unspecified: Secondary | ICD-10-CM

## 2012-05-27 DIAGNOSIS — J449 Chronic obstructive pulmonary disease, unspecified: Secondary | ICD-10-CM

## 2012-05-27 NOTE — Assessment & Plan Note (Signed)
Asked him to continue the pred taper and the augmentin as directed; no wheez on exam, expect that the bronchospastic component of this flare has resolved.

## 2012-05-27 NOTE — Telephone Encounter (Signed)
Patient was seen in office 05/21/12 No improvement in symptoms-patient states he has actually gotten worse--prod cough, yellow mucus and large knot on right neck and is very painful.  Patient was given Augmentin 875 and prednisone taper.  Patient is requesting to be seen today...placed on Dr Neville Route schedule as Acute Visit at 345p

## 2012-05-27 NOTE — Patient Instructions (Addendum)
Please continue to take your prednisone and Augmentin as directed Try using over the counter medications like tylenol or decongestants for symptoms  Continue mucinex Please call our office if you are still having symptoms next week Follow with Dr Waynetta Sandy as plannned

## 2012-05-27 NOTE — Assessment & Plan Note (Signed)
Continued sx despite abx and pred, most consistent with a viral process. Doubt flu. Reassured him about the predicted course, recommended tylenol, decongestants. He will call us next week if the sx aren't resolving w time.

## 2012-05-27 NOTE — Progress Notes (Signed)
Subjective:    Patient ID: Edwin [name], male    DOB: January 23, 1941, 71 y.o.   MRN: [id number]  HPI  71 y.o. WM quit smoking 1985 with min airflow obstruction GOLD A 01/08/11 followed in pulmonary clinic for recurrent bronchitis.   A1AT normal, Ig levels normal, Allergy profile normal  CT chest 10/12  Mild right posterior basilar interstitial reticulation. No evidence for bronchiectasis.  CT Sinus: mild max sinusitis bilateral  On Dexilant empiric for GERD   11/25 Avelox + prednisone taper - last visit 04/01/12 - felt better while on it then right back 2 wks ago  pt reports cough w/ yellow w/ occasional brown phlem, chest congestion, worn out, wheezing when lying down, increase in SOB-getting worse since Last Wed. saturday night had chills/fever. Cough wears him out- green phlegm  05/21/2012 Seen 11/25 Rx levaquin and pred. Culture pos strep pneumoniae Cough is still present. Still with yellow mucus. Remains hoarse.  When coughs hard time raising mucus.  Notes dyspnea with activity.  No sinus issues.    Acute visit 05/27/12 -- GOLD A COPD followed by Dr Delford Field. Treated 11/25 and then 12/20 for sinusitis and AE. Has been taking augmentin + pred taper since , was also started on [place] and nasal steroid 12/20. He started having cough w yellow mucous last week, malaise, one night sweat, no documented fever. He worsened instead of improving - more cough and SOB.       Objective:   Physical Exam  Filed Vitals:   05/27/12 1547  BP: 128/78  Pulse: 73  Temp: 98.3 F (36.8 C)  TempSrc: Oral  Height: 5' 10.5" (1.791 m)  Weight: 220 lb (99.791 kg)  SpO2: 94%    Gen: Pleasant, well-nourished, in no distress,  normal affect  ENT: No lesions,  mouth clear,  oropharynx clear, +++ postnasal drip, nasal purulence  Neck: No JVD, no TMG, no carotid bruits  Lungs: No use of accessory muscles, no dullness to percussion, clear without rales or rhonchi  Cardiovascular: RRR, heart sounds normal,  no murmur or gallops, no peripheral edema  Abdomen: soft and NT, no HSM,  BS normal  Musculoskeletal: No deformities, no cyanosis or clubbing  Neuro: alert, non focal  Skin: Warm, no lesions or rashes  No results found.      Assessment & Plan:   Chronic obstructive lung disease, gold stage A Asked him to continue the pred taper and the augmentin as directed; no wheez on exam, expect that the bronchospastic component of this flare has resolved.  Acute sinusitis with symptoms > 10 days Continued sx despite abx and pred, most consistent with a viral process. Doubt flu. Reassured him about the predicted course, recommended tylenol, decongestants. He will call us next week if the sx aren't resolving w time.     Updated Medication List Outpatient Encounter Prescriptions as of 05/27/2012  Medication Sig Dispense Refill  . albuterol (PROVENTIL HFA) 108 (90 BASE) MCG/ACT inhaler Inhale 2 puffs into the lungs every 4 (four) hours as needed for wheezing or shortness of breath. 1-2 puffs every 4 hrs as needed.  1 Inhaler  0  . allopurinol (ZYLOPRIM) 300 MG tablet Take 1 tablet (300 mg total) by mouth daily.  90 tablet  3  . amoxicillin-clavulanate (AUGMENTIN) 875-125 MG per tablet Take 1 tablet by mouth 2 (two) times daily.  28 tablet  0  . bimatoprost (LUMIGAN) 0.03 % ophthalmic solution Place 1 drop into both eyes at bedtime.        [name] Kitchen  brimonidine (ALPHAGAN P) 0.1 % SOLN 1 drop 2 (two) times daily. Both eyes       . budesonide-formoterol (SYMBICORT) 160-4.5 MCG/ACT inhaler INHALE TWO PUFFS BY MOUTH EVERY TWELVE HOURS  1 Inhaler  3  . dexlansoprazole (DEXILANT) 60 MG capsule Take 1 capsule (60 mg total) by mouth daily.  30 capsule  5  . FLUoxetine (PROZAC) 10 MG capsule TAKE ONE CAPSULE BY MOUTH ONE TIME DAILY  90 capsule  1  . Glucosamine-Chondroit-Vit C-Mn (GLUCOSAMINE 1500 COMPLEX PO) Take 2 tablets by mouth daily.        [name] Kitchen losartan (COZAAR) 100 MG tablet Take 1 tablet (100 mg total) by  mouth daily.  90 tablet  3  . mometasone (NASONEX) 50 MCG/ACT nasal spray Place 2 sprays into the nose daily.  17 g  6  . predniSONE (DELTASONE) 10 MG tablet Take 4 for three days 3 for three days 2 for three days 1 for three days and stop  30 tablet  0  . psyllium (METAMUCIL) 58.6 % powder Take 1 packet by mouth daily.        [name] Kitchen Respiratory Therapy Supplies (FLUTTER) DEVI Take as directed two to three times daily as needed      . tiotropium (SPIRIVA HANDIHALER) 18 MCG inhalation capsule Place 1 capsule (18 mcg total) into inhaler and inhale daily.  30 capsule  11  . traMADol (ULTRAM) 50 MG tablet 1-2 every 4 hours as needed for cough or pain

## 2012-06-28 ENCOUNTER — Other Ambulatory Visit: Payer: Self-pay | Admitting: Internal Medicine

## 2012-07-06 DIAGNOSIS — H4011X0 Primary open-angle glaucoma, stage unspecified: Secondary | ICD-10-CM | POA: Diagnosis not present

## 2012-07-08 ENCOUNTER — Encounter: Payer: Self-pay | Admitting: Critical Care Medicine

## 2012-07-08 ENCOUNTER — Ambulatory Visit (INDEPENDENT_AMBULATORY_CARE_PROVIDER_SITE_OTHER): Payer: Medicare Other | Admitting: Critical Care Medicine

## 2012-07-08 VITALS — BP 150/80 | HR 67 | Temp 98.1°F | Ht 70.5 in | Wt 221.0 lb

## 2012-07-08 DIAGNOSIS — J449 Chronic obstructive pulmonary disease, unspecified: Secondary | ICD-10-CM | POA: Diagnosis not present

## 2012-07-08 DIAGNOSIS — Z23 Encounter for immunization: Secondary | ICD-10-CM | POA: Diagnosis not present

## 2012-07-08 NOTE — Assessment & Plan Note (Signed)
Gold stage a COPD with frequent exacerbations due to to recurrent sinusitis and bronchitis Plan Maintain inhaled medications as prescribed No additional antibiotics indicated

## 2012-07-08 NOTE — Patient Instructions (Addendum)
Stay on inhalers and nasonex No further antibiotics A pneumovax will be given Return two months

## 2012-07-08 NOTE — Progress Notes (Signed)
Subjective:    Patient ID: Edwin [name], male    DOB: 01/21/1941, 72 y.o.   MRN: [id number]  HPI 72 y.o.  WM quit smoking 1985 with min airflow obstruction GOLD A 01/08/11 followed in pulmonary clinic for recurrent bronchitis.   A1AT normal, Ig levels normal, Allergy profile normal  CT chest 10/12  Mild right posterior basilar interstitial reticulation. No evidence for bronchiectasis.  CT Sinus: mild max sinusitis bilateral  On Dexilant empiric for GERD   11/25 Avelox + prednisone taper - last visit 04/01/12 - felt better while on it then right back 2 wks ago  pt reports cough w/ yellow w/ occasional brown phlem, chest congestion, worn out, wheezing when lying down, increase in SOB-getting worse since Last Wed. saturday night had chills/fever. Cough wears him out- green phlegm  05/21/2012 Seen 11/25 Rx levaquin and pred. Culture pos strep pneumoniae Cough is still present. Still with yellow mucus. Remains hoarse.  When coughs hard time raising mucus.  Notes dyspnea with activity.  No sinus issues.    Acute visit 05/27/12 -- GOLD A COPD followed by Dr Delford Field. Treated 11/25 and then 12/20 for sinusitis and AE. Has been taking augmentin + pred taper since , was also started on [place] and nasal steroid 12/20. He started having cough w yellow mucous last week, malaise, one night sweat, no documented fever. He worsened instead of improving - more cough and SOB.    07/08/2012 Pt saw RB day after Christmas. Rx finish pred/augmentin.  Feels better compared to December. Mucus now is yellow.  No heartburn.  Notes sl dyspnea.  No real chest pain.  Sl pndrip.  Sl hoarse. Still with throat clearing.  No sinus pressure.  On nasonex.       Review of Systems Constitutional:   No  weight loss, night sweats,  Fevers, chills, fatigue, lassitude. HEENT:   No headaches,  Difficulty swallowing,  Tooth/dental problems,  Sore throat,                No sneezing, itching, ear ache, nasal congestion, post nasal  drip,   CV:  No chest pain,  Orthopnea, PND, swelling in lower extremities, anasarca, dizziness, palpitations  GI  No heartburn, indigestion, abdominal pain, nausea, vomiting, diarrhea, change in bowel habits, loss of appetite  Resp: Notes  shortness of breath with exertion not  at rest.  No excess mucus, no productive cough,  Notes  non-productive cough,  No coughing up of blood.  No change in color of mucus.  No wheezing.  No chest wall deformity  Skin: no rash or lesions.  GU: no dysuria, change in color of urine, no urgency or frequency.  No flank pain.  MS:  No joint pain or swelling.  No decreased range of motion.  No back pain.  Psych:  No change in mood or affect. No depression or anxiety.  No memory loss.     Objective:   Physical Exam  Filed Vitals:   07/08/12 0941  BP: 150/80  Pulse: 67  Temp: 98.1 F (36.7 C)  TempSrc: Oral  Height: 5' 10.5" (1.791 m)  Weight: 221 lb (100.245 kg)  SpO2: 97%    Gen: Pleasant, well-nourished, in no distress,  normal affect  ENT: No lesions,  mouth clear,  oropharynx clear, no postnasal drip  Neck: No JVD, no TMG, no carotid bruits  Lungs: No use of accessory muscles, no dullness to percussion, scattered rhonchi right lower lobe  Cardiovascular: RRR, heart sounds  normal, no murmur or gallops, no peripheral edema  Abdomen: soft and NT, no HSM,  BS normal  Musculoskeletal: No deformities, no cyanosis or clubbing  Neuro: alert, non focal  Skin: Warm, no lesions or rashes  No results found.       Assessment & Plan:   Chronic obstructive lung disease, gold stage A Gold stage a COPD with frequent exacerbations due to to recurrent sinusitis and bronchitis Plan Maintain inhaled medications as prescribed No additional antibiotics indicated   Updated Medication List Outpatient Encounter Prescriptions as of 07/08/2012  Medication Sig Dispense Refill  . albuterol (PROVENTIL HFA) 108 (90 BASE) MCG/ACT inhaler Inhale 2  puffs into the lungs every 4 (four) hours as needed for wheezing or shortness of breath. 1-2 puffs every 4 hrs as needed.  1 Inhaler  0  . allopurinol (ZYLOPRIM) 300 MG tablet Take 1 tablet (300 mg total) by mouth daily.  90 tablet  3  . bimatoprost (LUMIGAN) 0.03 % ophthalmic solution Place 1 drop into both eyes at bedtime.        . brimonidine (ALPHAGAN P) 0.1 % SOLN 1 drop 2 (two) times daily. Both eyes       . budesonide-formoterol (SYMBICORT) 160-4.5 MCG/ACT inhaler INHALE TWO PUFFS BY MOUTH EVERY TWELVE HOURS  1 Inhaler  3  . dexlansoprazole (DEXILANT) 60 MG capsule Take 1 capsule (60 mg total) by mouth daily.  30 capsule  5  . FLUoxetine (PROZAC) 10 MG capsule TAKE ONE CAPSULE BY MOUTH ONE TIME DAILY  90 capsule  1  . Glucosamine-Chondroit-Vit C-Mn (GLUCOSAMINE 1500 COMPLEX PO) Take 2 tablets by mouth daily.        [name] Kitchen losartan (COZAAR) 100 MG tablet TAKE ONE TABLET BY MOUTH ONE TIME DAILY  30 tablet  11  . mometasone (NASONEX) 50 MCG/ACT nasal spray Place 2 sprays into the nose daily.  17 g  6  . psyllium (METAMUCIL) 58.6 % powder Take 1 packet by mouth daily.        [name] Kitchen Respiratory Therapy Supplies (FLUTTER) DEVI Take as directed two to three times daily as needed      . tiotropium (SPIRIVA HANDIHALER) 18 MCG inhalation capsule Place 1 capsule (18 mcg total) into inhaler and inhale daily.  30 capsule  11  . traMADol (ULTRAM) 50 MG tablet 1-2 every 4 hours as needed for cough or pain      . [DISCONTINUED] losartan (COZAAR) 100 MG tablet Take 1 tablet (100 mg total) by mouth daily.  90 tablet  3  . [DISCONTINUED] amoxicillin-clavulanate (AUGMENTIN) 875-125 MG per tablet Take 1 tablet by mouth 2 (two) times daily.  28 tablet  0  . [DISCONTINUED] predniSONE (DELTASONE) 10 MG tablet Take 4 for three days 3 for three days 2 for three days 1 for three days and stop  30 tablet  0

## 2012-08-12 DIAGNOSIS — Z1211 Encounter for screening for malignant neoplasm of colon: Secondary | ICD-10-CM | POA: Diagnosis not present

## 2012-08-12 DIAGNOSIS — K648 Other hemorrhoids: Secondary | ICD-10-CM | POA: Diagnosis not present

## 2012-08-12 DIAGNOSIS — Z86010 Personal history of colonic polyps: Secondary | ICD-10-CM | POA: Diagnosis not present

## 2012-08-12 DIAGNOSIS — D126 Benign neoplasm of colon, unspecified: Secondary | ICD-10-CM | POA: Diagnosis not present

## 2012-09-01 DIAGNOSIS — K648 Other hemorrhoids: Secondary | ICD-10-CM | POA: Diagnosis not present

## 2012-09-07 ENCOUNTER — Other Ambulatory Visit: Payer: Self-pay | Admitting: Internal Medicine

## 2012-09-09 ENCOUNTER — Encounter: Payer: Self-pay | Admitting: Critical Care Medicine

## 2012-09-09 ENCOUNTER — Ambulatory Visit (INDEPENDENT_AMBULATORY_CARE_PROVIDER_SITE_OTHER): Payer: Medicare Other | Admitting: Critical Care Medicine

## 2012-09-09 VITALS — BP 142/80 | HR 71 | Temp 98.5°F | Ht 70.5 in | Wt 215.0 lb

## 2012-09-09 DIAGNOSIS — J449 Chronic obstructive pulmonary disease, unspecified: Secondary | ICD-10-CM | POA: Diagnosis not present

## 2012-09-09 NOTE — Patient Instructions (Addendum)
No change in medications. Return in         4 months 

## 2012-09-09 NOTE — Progress Notes (Addendum)
Subjective:    Patient ID: Edwin [name], male    DOB: [birth year], 72 y.o.   MRN: [id number]  HPI  72 y.o.  WM quit smoking 1985 with min airflow obstruction GOLD A 01/08/11 followed in pulmonary clinic for recurrent bronchitis.   A1AT normal, Ig levels normal, Allergy profile normal  CT chest 10/12  Mild right posterior basilar interstitial reticulation. No evidence for bronchiectasis.  CT Sinus: mild max sinusitis bilateral  On Dexilant empiric for GERD   11/25 Avelox + prednisone taper - last visit 04/01/12 - felt better while on it then right back 2 wks ago  pt reports cough w/ yellow w/ occasional brown phlem, chest congestion, worn out, wheezing when lying down, increase in SOB-getting worse since Last Wed. saturday night had chills/fever. Cough wears him out- green phlegm  05/21/2012 Seen 11/25 Rx levaquin and pred. Culture pos strep pneumoniae Cough is still present. Still with yellow mucus. Remains hoarse.  When coughs hard time raising mucus.  Notes dyspnea with activity.  No sinus issues.    Acute visit 05/27/12 -- GOLD A COPD followed by Dr Delford Field. Treated 11/25 and then 12/20 for sinusitis and AE. Has been taking augmentin + pred taper since , was also started on [place] and nasal steroid 12/20. He started having cough w yellow mucous last week, malaise, one night sweat, no documented fever. He worsened instead of improving - more cough and SOB.    07/08/2012 Pt saw RB day after Christmas. Rx finish pred/augmentin.  Feels better compared to December. Mucus now is yellow.  No heartburn.  Notes sl dyspnea.  No real chest pain.  Sl pndrip.  Sl hoarse. Still with throat clearing.  No sinus pressure.  On nasonex.     09/09/2012 At last ov we maintained meds as Rx No real changes , still has consistent amount of mucus daily Coughs up 15x per day. Mucus color is yellow to white to clear. Still dyspneic with exertion.  No real wheeze.  Pt will gurgle with clearing mucus.  No wheezing.   No chest pain No f/c/s.  No blood in mucus.   Review of Systems  Constitutional:   No  weight loss, night sweats,  Fevers, chills, fatigue, lassitude. HEENT:   No headaches,  Difficulty swallowing,  Tooth/dental problems,  Sore throat,                No sneezing, itching, ear ache, nasal congestion, post nasal drip,   CV:  No chest pain,  Orthopnea, PND, swelling in lower extremities, anasarca, dizziness, palpitations  GI  No heartburn, indigestion, abdominal pain, nausea, vomiting, diarrhea, change in bowel habits, loss of appetite  Resp: Notes  shortness of breath with exertion not  at rest.  No excess mucus, no productive cough,  Notes  non-productive cough,  No coughing up of blood.  No change in color of mucus.  No wheezing.  No chest wall deformity  Skin: no rash or lesions.  GU: no dysuria, change in color of urine, no urgency or frequency.  No flank pain.  MS:  No joint pain or swelling.  No decreased range of motion.  No back pain.  Psych:  No change in mood or affect. No depression or anxiety.  No memory loss.     Objective:   Physical Exam   Filed Vitals:   09/09/12 1505  BP: 142/80  Pulse: 71  Temp: 98.5 F (36.9 C)  TempSrc: Oral  Height: 5' 10.5" (1.791  m)  Weight: 215 lb (97.523 kg)  SpO2: 96%    Gen: Pleasant, well-nourished, in no distress,  normal affect  ENT: No lesions,  mouth clear,  oropharynx clear, no postnasal drip  Neck: No JVD, no TMG, no carotid bruits  Lungs: No use of accessory muscles, no dullness to percussion, scattered rhonchi right lower lobe  Cardiovascular: RRR, heart sounds normal, no murmur or gallops, no peripheral edema  Abdomen: soft and NT, no HSM,  BS normal  Musculoskeletal: No deformities, no cyanosis or clubbing  Neuro: alert, non focal  Skin: Warm, no lesions or rashes  No results found.       Assessment & Plan:   No problem-specific assessment & plan notes found for this encounter.   Updated  Medication List Outpatient Encounter Prescriptions as of 09/09/2012  Medication Sig Dispense Refill  . albuterol (PROVENTIL HFA) 108 (90 BASE) MCG/ACT inhaler Inhale 2 puffs into the lungs every 4 (four) hours as needed for wheezing or shortness of breath. 1-2 puffs every 4 hrs as needed.  1 Inhaler  0  . allopurinol (ZYLOPRIM) 300 MG tablet Take 1 tablet (300 mg total) by mouth daily.  90 tablet  3  . bimatoprost (LUMIGAN) 0.03 % ophthalmic solution Place 1 drop into both eyes at bedtime.        . brimonidine (ALPHAGAN P) 0.1 % SOLN 1 drop 2 (two) times daily. Both eyes       . dexlansoprazole (DEXILANT) 60 MG capsule Take 1 capsule (60 mg total) by mouth daily.  30 capsule  5  . FLUoxetine (PROZAC) 10 MG capsule TAKE ONE CAPSULE BY MOUTH ONE TIME DAILY  90 capsule  1  . Glucosamine-Chondroit-Vit C-Mn (GLUCOSAMINE 1500 COMPLEX PO) Take 2 tablets by mouth daily.        [name] Kitchen losartan (COZAAR) 100 MG tablet TAKE ONE TABLET BY MOUTH ONE TIME DAILY  30 tablet  11  . mometasone (NASONEX) 50 MCG/ACT nasal spray Place 2 sprays into the nose daily as needed.      . psyllium (METAMUCIL) 58.6 % powder Take 1 packet by mouth daily.        [name] Kitchen Respiratory Therapy Supplies (FLUTTER) DEVI Take as directed two to three times daily as needed      . SYMBICORT 160-4.5 MCG/ACT inhaler INHALE TWO PUFFS BY MOUTH EVERY TWELVE HOURS  10.2 g  5  . tiotropium (SPIRIVA HANDIHALER) 18 MCG inhalation capsule Place 1 capsule (18 mcg total) into inhaler and inhale daily.  30 capsule  11  . traMADol (ULTRAM) 50 MG tablet 1-2 every 4 hours as needed for cough or pain      . [DISCONTINUED] mometasone (NASONEX) 50 MCG/ACT nasal spray Place 2 sprays into the nose daily.  17 g  6   No facility-administered encounter medications on file as of 09/09/2012.

## 2012-09-10 NOTE — Assessment & Plan Note (Addendum)
Golds A copd with recurrent tracheobronchitis now improved and stable Plan No change in inhaled or maintenance medications. Return in  2 months

## 2012-09-15 DIAGNOSIS — K648 Other hemorrhoids: Secondary | ICD-10-CM | POA: Diagnosis not present

## 2012-09-22 ENCOUNTER — Other Ambulatory Visit: Payer: Self-pay | Admitting: Internal Medicine

## 2012-09-29 DIAGNOSIS — K648 Other hemorrhoids: Secondary | ICD-10-CM | POA: Diagnosis not present

## 2013-01-11 DIAGNOSIS — H353190 Nonexudative age-related macular degeneration, unspecified eye, stage unspecified: Secondary | ICD-10-CM | POA: Diagnosis not present

## 2013-01-11 DIAGNOSIS — H4011X0 Primary open-angle glaucoma, stage unspecified: Secondary | ICD-10-CM | POA: Diagnosis not present

## 2013-01-20 ENCOUNTER — Encounter: Payer: Self-pay | Admitting: Critical Care Medicine

## 2013-01-20 ENCOUNTER — Ambulatory Visit (INDEPENDENT_AMBULATORY_CARE_PROVIDER_SITE_OTHER): Payer: Medicare Other | Admitting: Critical Care Medicine

## 2013-01-20 VITALS — BP 142/90 | HR 70 | Temp 98.1°F | Ht 70.5 in | Wt 214.0 lb

## 2013-01-20 DIAGNOSIS — J449 Chronic obstructive pulmonary disease, unspecified: Secondary | ICD-10-CM

## 2013-01-20 MED ORDER — MOMETASONE FUROATE 50 MCG/ACT NA SUSP: 2.0000 | Freq: Every day | NASAL | Status: DC | PRN

## 2013-01-20 NOTE — Assessment & Plan Note (Signed)
Chronic obstructive lung disease with gold stage a exacerbated by recurrent sinusitis stable at this time Plan Maintain inhaled medications as prescribed

## 2013-01-20 NOTE — Progress Notes (Signed)
Subjective:    Patient ID: Edwin [name], male    DOB: 15-Jun-1940, 72 y.o.   MRN: [id number]  HPI  72 y.o.  WM quit smoking 1985 with min airflow obstruction GOLD A 01/08/11 followed in pulmonary clinic for recurrent bronchitis.   A1AT normal, Ig levels normal, Allergy profile normal  CT chest 10/12  Mild right posterior basilar interstitial reticulation. No evidence for bronchiectasis.  CT Sinus: mild max sinusitis bilateral  On Dexilant empiric for GERD   11/25 Avelox + prednisone taper - last visit 04/01/12 - felt better while on it then right back 2 wks ago  pt reports cough w/ yellow w/ occasional brown phlem, chest congestion, worn out, wheezing when lying down, increase in SOB-getting worse since Last Wed. saturday night had chills/fever. Cough wears him out- green phlegm  05/21/2012 Seen 11/25 Rx levaquin and pred. Culture pos strep pneumoniae Cough is still present. Still with yellow mucus. Remains hoarse.  When coughs hard time raising mucus.  Notes dyspnea with activity.  No sinus issues.    Acute visit 05/27/12 -- GOLD A COPD followed by Dr Delford Field. Treated 11/25 and then 12/20 for sinusitis and AE. Has been taking augmentin + pred taper since , was also started on [place] and nasal steroid 12/20. He started having cough w yellow mucous last week, malaise, one night sweat, no documented fever. He worsened instead of improving - more cough and SOB.    07/08/2012 Pt saw RB day after Christmas. Rx finish pred/augmentin.  Feels better compared to December. Mucus now is yellow.  No heartburn.  Notes sl dyspnea.  No real chest pain.  Sl pndrip.  Sl hoarse. Still with throat clearing.  No sinus pressure.  On nasonex.     09/09/2012 At last ov we maintained meds as Rx No real changes , still has consistent amount of mucus daily Coughs up 15x per day. Mucus color is yellow to white to clear. Still dyspneic with exertion.  No real wheeze.  Pt will gurgle with clearing mucus.  No wheezing.   No chest pain No f/c/s.  No blood in mucus.  01/20/2013 Chief Complaint  Patient presents with  . 4 month follow up    Breathing is at baseline.  Has DOE and prod cough with off white to yellow mucus. Not much wheezing,  No chest tightness or chest pain.  More recently the patient's been doing well. There is some shortness with exertion and some minimal productive cough. There is no chest tightness or chest pain. There is no fever chills or sweats.  Review of Systems  Constitutional:   No  weight loss, night sweats,  Fevers, chills, fatigue, lassitude. HEENT:   No headaches,  Difficulty swallowing,  Tooth/dental problems,  Sore throat,                No sneezing, itching, ear ache, nasal congestion, post nasal drip,   CV:  No chest pain,  Orthopnea, PND, swelling in lower extremities, anasarca, dizziness, palpitations  GI  No heartburn, indigestion, abdominal pain, nausea, vomiting, diarrhea, change in bowel habits, loss of appetite  Resp: Notes  shortness of breath with exertion not  at rest.  No excess mucus, no productive cough,  Notes  non-productive cough,  No coughing up of blood.  No change in color of mucus.  No wheezing.  No chest wall deformity  Skin: no rash or lesions.  GU: no dysuria, change in color of urine, no urgency or frequency.  No flank pain.  MS:  No joint pain or swelling.  No decreased range of motion.  No back pain.  Psych:  No change in mood or affect. No depression or anxiety.  No memory loss.     Objective:   Physical Exam   Filed Vitals:   01/20/13 0847  BP: 142/90  Pulse: 70  Temp: 98.1 F (36.7 C)  TempSrc: Oral  Height: 5' 10.5" (1.791 m)  Weight: 214 lb (97.07 kg)  SpO2: 96%    Gen: Pleasant, well-nourished, in no distress,  normal affect  ENT: No lesions,  mouth clear,  oropharynx clear, no postnasal drip  Neck: No JVD, no TMG, no carotid bruits  Lungs: No use of accessory muscles, no dullness to percussion, clear compared to  prior exams  Cardiovascular: RRR, heart sounds normal, no murmur or gallops, no peripheral edema  Abdomen: soft and NT, no HSM,  BS normal  Musculoskeletal: No deformities, no cyanosis or clubbing  Neuro: alert, non focal  Skin: Warm, no lesions or rashes  No results found.       Assessment & Plan:   Chronic obstructive lung disease, gold stage A Chronic obstructive lung disease with gold stage a exacerbated by recurrent sinusitis stable at this time Plan Maintain inhaled medications as prescribed     Updated Medication List Outpatient Encounter Prescriptions as of 01/20/2013  Medication Sig Dispense Refill  . albuterol (PROVENTIL HFA) 108 (90 BASE) MCG/ACT inhaler Inhale 2 puffs into the lungs every 4 (four) hours as needed for wheezing or shortness of breath. 1-2 puffs every 4 hrs as needed.  1 Inhaler  0  . allopurinol (ZYLOPRIM) 300 MG tablet Take 1 tablet (300 mg total) by mouth daily.  90 tablet  3  . bimatoprost (LUMIGAN) 0.03 % ophthalmic solution Place 1 drop into both eyes at bedtime.        . brimonidine (ALPHAGAN P) 0.1 % SOLN 1 drop 2 (two) times daily. Both eyes       . dexlansoprazole (DEXILANT) 60 MG capsule Take 1 capsule (60 mg total) by mouth daily.  30 capsule  5  . FLUoxetine (PROZAC) 10 MG capsule TAKE ONE CAPSULE BY MOUTH ONE TIME DAILY  90 capsule  1  . Glucosamine-Chondroit-Vit C-Mn (GLUCOSAMINE 1500 COMPLEX PO) Take 2 tablets by mouth daily.        [name] Kitchen losartan (COZAAR) 100 MG tablet TAKE ONE TABLET BY MOUTH ONE TIME DAILY  30 tablet  11  . mometasone (NASONEX) 50 MCG/ACT nasal spray Place 2 sprays into the nose daily as needed.  17 g  11  . psyllium (METAMUCIL) 58.6 % powder Take 1 packet by mouth daily.        [name] Kitchen Respiratory Therapy Supplies (FLUTTER) DEVI Take as directed two to three times daily as needed      . SYMBICORT 160-4.5 MCG/ACT inhaler INHALE TWO PUFFS BY MOUTH EVERY TWELVE HOURS  10.2 g  5  . tiotropium (SPIRIVA HANDIHALER) 18 MCG  inhalation capsule Place 1 capsule (18 mcg total) into inhaler and inhale daily.  30 capsule  11  . tobramycin-dexamethasone (TOBRADEX) ophthalmic solution Place 1 drop into the right eye 4 (four) times daily.      . [DISCONTINUED] mometasone (NASONEX) 50 MCG/ACT nasal spray Place 2 sprays into the nose daily as needed.       No facility-administered encounter medications on file as of 01/20/2013.

## 2013-01-20 NOTE — Patient Instructions (Addendum)
No change in medications. Return in           4 months Remember flu vaccine this fall    Use nasonex daily 1-2 puff ea nostril

## 2013-02-28 ENCOUNTER — Encounter: Payer: Self-pay | Admitting: Critical Care Medicine

## 2013-02-28 ENCOUNTER — Ambulatory Visit (INDEPENDENT_AMBULATORY_CARE_PROVIDER_SITE_OTHER): Payer: Medicare Other | Admitting: Critical Care Medicine

## 2013-02-28 ENCOUNTER — Telehealth: Payer: Self-pay | Admitting: Critical Care Medicine

## 2013-02-28 VITALS — BP 136/74 | HR 55 | Temp 98.2°F | Ht 70.5 in | Wt 216.0 lb

## 2013-02-28 DIAGNOSIS — J449 Chronic obstructive pulmonary disease, unspecified: Secondary | ICD-10-CM | POA: Diagnosis not present

## 2013-02-28 MED ORDER — LEVOFLOXACIN 750 MG PO TABS: 750.0000 mg | ORAL_TABLET | Freq: Every day | ORAL | Status: DC

## 2013-02-28 MED ORDER — MOXIFLOXACIN HCL 400 MG PO TABS: 400.0000 mg | ORAL_TABLET | Freq: Every day | ORAL | Status: DC

## 2013-02-28 MED ORDER — HYDROCODONE-HOMATROPINE 5-1.5 MG/5ML PO SYRP: 5.0000 mL | ORAL_SOLUTION | Freq: Four times a day (QID) | ORAL | Status: DC | PRN

## 2013-02-28 NOTE — Telephone Encounter (Signed)
Pt is scheduled to go see PW in [place] this AM att 11:30. Nothing further needed

## 2013-02-28 NOTE — Progress Notes (Signed)
Subjective:    Patient ID: Edwin [name], male    DOB: [birth year], 72 y.o.   MRN: [id number]  HPI  72 y.o.  WM quit smoking 1985 with min airflow obstruction GOLD A 01/08/11 followed in pulmonary clinic for recurrent bronchitis.   A1AT normal, Ig levels normal, Allergy profile normal  CT chest 10/12  Mild right posterior basilar interstitial reticulation. No evidence for bronchiectasis.  CT Sinus: mild max sinusitis bilateral  On Dexilant empiric for GERD   02/28/2013 Chief Complaint  Patient presents with  . Acute Visit    prod cough with yellow mucus and chest heaviness - started on Saturday.  No f/c/s.  Pt notes over w/e worse with cough yellow mucus and chest heavy.  No energy. No fever. No sinus issues. Notes a sore throat, started there.  Notes some dyspnea with exertion.  Spiriva has helped with the dyspnea.   Review of Systems  Constitutional:   No  weight loss, night sweats,  Fevers, chills, fatigue, lassitude. HEENT:   No headaches,  Difficulty swallowing,  Tooth/dental problems,  Sore throat,                No sneezing, itching, ear ache, nasal congestion, post nasal drip,   CV:  No chest pain,  Orthopnea, PND, swelling in lower extremities, anasarca, dizziness, palpitations  GI  No heartburn, indigestion, abdominal pain, nausea, vomiting, diarrhea, change in bowel habits, loss of appetite  Resp: Notes  shortness of breath with exertion not  at rest.  No excess mucus, no productive cough,  Notes  non-productive cough,  No coughing up of blood.  No change in color of mucus.  No wheezing.  No chest wall deformity  Skin: no rash or lesions.  GU: no dysuria, change in color of urine, no urgency or frequency.  No flank pain.  MS:  No joint pain or swelling.  No decreased range of motion.  No back pain.  Psych:  No change in mood or affect. No depression or anxiety.  No memory loss.     Objective:   Physical Exam   Filed Vitals:   02/28/13 1223  BP: 136/74  Pulse:  55  Temp: 98.2 F (36.8 C)  TempSrc: Oral  Height: 5' 10.5" (1.791 m)  Weight: 216 lb (97.977 kg)  SpO2: 96%    Gen: Pleasant, well-nourished, in no distress,  normal affect  ENT: No lesions,  mouth clear,  oropharynx clear, no postnasal drip  Neck: No JVD, no TMG, no carotid bruits  Lungs: No use of accessory muscles, no dullness to percussion, clear compared to prior exams  Cardiovascular: RRR, heart sounds normal, no murmur or gallops, no peripheral edema  Abdomen: soft and NT, no HSM,  BS normal  Musculoskeletal: No deformities, no cyanosis or clubbing  Neuro: alert, non focal  Skin: Warm, no lesions or rashes  No results found.       Assessment & Plan:   Chronic obstructive lung disease, gold stage A Copd with AB flare Plan Levaquin one daily for 7days Hycodan as needed for cough  Stay on symbicort/spiriva Return 6 weeks for recheck     Updated Medication List Outpatient Encounter Prescriptions as of 02/28/2013  Medication Sig Dispense Refill  . albuterol (PROVENTIL HFA) 108 (90 BASE) MCG/ACT inhaler Inhale 2 puffs into the lungs every 4 (four) hours as needed for wheezing or shortness of breath. 1-2 puffs every 4 hrs as needed.  1 Inhaler  0  . allopurinol (  ZYLOPRIM) 300 MG tablet Take 1 tablet (300 mg total) by mouth daily.  90 tablet  3  . bimatoprost (LUMIGAN) 0.03 % ophthalmic solution Place 1 drop into both eyes at bedtime.        . brimonidine (ALPHAGAN P) 0.1 % SOLN 1 drop 2 (two) times daily. Both eyes       . dexlansoprazole (DEXILANT) 60 MG capsule Take 1 capsule (60 mg total) by mouth daily.  30 capsule  5  . FLUoxetine (PROZAC) 10 MG capsule TAKE ONE CAPSULE BY MOUTH ONE TIME DAILY  90 capsule  1  . Glucosamine-Chondroit-Vit C-Mn (GLUCOSAMINE 1500 COMPLEX PO) Take 2 tablets by mouth daily.        [name] Kitchen losartan (COZAAR) 100 MG tablet TAKE ONE TABLET BY MOUTH ONE TIME DAILY  30 tablet  11  . mometasone (NASONEX) 50 MCG/ACT nasal spray Place 2 sprays  into the nose daily as needed.  17 g  11  . psyllium (METAMUCIL) 58.6 % powder Take 1 packet by mouth daily.        [name] Kitchen Respiratory Therapy Supplies (FLUTTER) DEVI Take as directed two to three times daily as needed      . SYMBICORT 160-4.5 MCG/ACT inhaler INHALE TWO PUFFS BY MOUTH EVERY TWELVE HOURS  10.2 g  5  . tiotropium (SPIRIVA HANDIHALER) 18 MCG inhalation capsule Place 1 capsule (18 mcg total) into inhaler and inhale daily.  30 capsule  11  . tobramycin-dexamethasone (TOBRADEX) ophthalmic solution Place 1 drop into the right eye 4 (four) times daily.      [name] Kitchen HYDROcodone-homatropine (HYCODAN) 5-1.5 MG/5ML syrup Take 5 mLs by mouth every 6 (six) hours as needed for cough.  120 mL  0  . levofloxacin (LEVAQUIN) 750 MG tablet Take 1 tablet (750 mg total) by mouth daily.  7 tablet  0  . moxifloxacin (AVELOX) 400 MG tablet Take 1 tablet (400 mg total) by mouth daily.  7 tablet  0   No facility-administered encounter medications on file as of 02/28/2013.

## 2013-02-28 NOTE — Telephone Encounter (Signed)
I spoke with pt. He c/o productive cough w/ yellow sputum x saturday. His chest feels heavy but no wheezing and no increase SOB. He is using his symbicort and spiriva daily. Pt is requesting recs. Please advise Dr. Delford Field thanks  Allergies  Allergen Reactions  . Ramipril     REACTION: chest congestion & cough. No angioedema

## 2013-02-28 NOTE — Telephone Encounter (Signed)
Can the pt come to [facility] office this am? Around 1130?   If not:  Call in avelox 400/d x 7days and would need OV later in week with TP Needs CXR first

## 2013-02-28 NOTE — Assessment & Plan Note (Signed)
Copd with AB flare Plan Levaquin one daily for 7days Hycodan as needed for cough  Stay on symbicort/spiriva Return 6 weeks for recheck

## 2013-02-28 NOTE — Patient Instructions (Addendum)
Levaquin one daily for 7days Hycodan as needed for cough  Stay on symbicort/spiriva Return 6 weeks for recheck

## 2013-03-07 ENCOUNTER — Other Ambulatory Visit: Payer: Self-pay | Admitting: Critical Care Medicine

## 2013-03-11 DIAGNOSIS — Z23 Encounter for immunization: Secondary | ICD-10-CM | POA: Diagnosis not present

## 2013-03-25 ENCOUNTER — Other Ambulatory Visit: Payer: Self-pay | Admitting: Internal Medicine

## 2013-03-25 NOTE — Telephone Encounter (Signed)
Med filled, noted pt needs an appt with [facility] for more refills.

## 2013-04-06 DIAGNOSIS — H4011X0 Primary open-angle glaucoma, stage unspecified: Secondary | ICD-10-CM | POA: Diagnosis not present

## 2013-04-11 ENCOUNTER — Ambulatory Visit (INDEPENDENT_AMBULATORY_CARE_PROVIDER_SITE_OTHER): Payer: Medicare Other | Admitting: Critical Care Medicine

## 2013-04-11 ENCOUNTER — Encounter: Payer: Self-pay | Admitting: Critical Care Medicine

## 2013-04-11 VITALS — BP 142/86 | HR 67 | Temp 98.3°F | Ht 70.0 in | Wt 219.0 lb

## 2013-04-11 DIAGNOSIS — J441 Chronic obstructive pulmonary disease with (acute) exacerbation: Secondary | ICD-10-CM | POA: Diagnosis not present

## 2013-04-11 NOTE — Assessment & Plan Note (Signed)
Gold stage A COPD with minimal airway obstruction but recurrent tracheobronchitis stable at this time Plan Maintain inhaled medications as prescribed Obtain a sputum culture

## 2013-04-11 NOTE — Patient Instructions (Signed)
A Sputum culture will be obtained No change in medications Use your flutter valve twice a day around your teeth brushing, keep it with your tooth brush Return 3 months

## 2013-04-11 NOTE — Progress Notes (Signed)
Subjective:    Patient ID: Edwin [name], male    DOB: November 26, 1940, 72 y.o.   MRN: [id number]  HPI  72 y.o.  WM quit smoking 1985 with min airflow obstruction GOLD A 01/08/11 followed in pulmonary clinic for recurrent bronchitis.   A1AT normal, Ig levels normal, Allergy profile normal  CT chest 10/12  Mild right posterior basilar interstitial reticulation. No evidence for bronchiectasis.  CT Sinus: mild max sinusitis bilateral  On Dexilant empiric for GERD   04/11/2013 Chief Complaint  Patient presents with  . Follow-up    Pt c/o constant congestion, prod cough with yellow to clear mucous  Pt with constant mucus. No change in volume of mucus.  Pt coughs up a teaspoon each time. Yellow to clear mucus.  No chest pain. Pt notes DOE.  No edema in feet.   No blood in mucus. No f/c/s.  Review of Systems  Constitutional:   No  weight loss, night sweats,  Fevers, chills, fatigue, lassitude. HEENT:   No headaches,  Difficulty swallowing,  Tooth/dental problems,  Sore throat,                No sneezing, itching, ear ache, nasal congestion, post nasal drip,   CV:  No chest pain,  Orthopnea, PND, swelling in lower extremities, anasarca, dizziness, palpitations  GI  No heartburn, indigestion, abdominal pain, nausea, vomiting, diarrhea, change in bowel habits, loss of appetite  Resp: Notes  shortness of breath with exertion not  at rest.  No excess mucus, notes productive cough,  Notes  non-productive cough,  No coughing up of blood.  No change in color of mucus.  No wheezing.  No chest wall deformity  Skin: no rash or lesions.  GU: no dysuria, change in color of urine, no urgency or frequency.  No flank pain.  MS:  No joint pain or swelling.  No decreased range of motion.  No back pain.  Psych:  No change in mood or affect. No depression or anxiety.  No memory loss.     Objective:   Physical Exam  Filed Vitals:   04/11/13 0905  BP: 142/86  Pulse: 67  Temp: 98.3 F (36.8 C)   TempSrc: Oral  Height: 5\' 10"  (1.778 m)  Weight: 219 lb (99.338 kg)  SpO2: 95%    Gen: Pleasant, well-nourished, in no distress,  normal affect  ENT: No lesions,  mouth clear,  oropharynx clear, no postnasal drip  Neck: No JVD, no TMG, no carotid bruits  Lungs: No use of accessory muscles, no dullness to percussion, clear compared to prior exams  Cardiovascular: RRR, heart sounds normal, no murmur or gallops, no peripheral edema  Abdomen: soft and NT, no HSM,  BS normal  Musculoskeletal: No deformities, no cyanosis or clubbing  Neuro: alert, non focal  Skin: Warm, no lesions or rashes  No results found.     Assessment & Plan:   Obstructive chronic bronchitis without exacerbation Gold Stage A COPD Gold stage A COPD with minimal airway obstruction but recurrent tracheobronchitis stable at this time Plan Maintain inhaled medications as prescribed Obtain a sputum culture    Updated Medication List Outpatient Encounter Prescriptions as of 04/11/2013  Medication Sig  . albuterol (PROVENTIL HFA) 108 (90 BASE) MCG/ACT inhaler Inhale 2 puffs into the lungs every 4 (four) hours as needed for wheezing or shortness of breath. 1-2 puffs every 4 hrs as needed.  [name] Kitchen allopurinol (ZYLOPRIM) 300 MG tablet Take 1 tablet (300 mg total) by  mouth daily.  . bimatoprost (LUMIGAN) 0.03 % ophthalmic solution Place 1 drop into both eyes at bedtime.    . brimonidine (ALPHAGAN P) 0.1 % SOLN 1 drop 2 (two) times daily. Both eyes   . DEXILANT 60 MG capsule Take one capsule by mouth one time daily  . FLUoxetine (PROZAC) 10 MG capsule TAKE ONE CAPSULE BY MOUTH ONE TIME DAILY   . Glucosamine-Chondroit-Vit C-Mn (GLUCOSAMINE 1500 COMPLEX PO) Take 2 tablets by mouth daily.    [name] Kitchen HYDROcodone-homatropine (HYCODAN) 5-1.5 MG/5ML syrup Take 5 mLs by mouth every 6 (six) hours as needed for cough.  . losartan (COZAAR) 100 MG tablet TAKE ONE TABLET BY MOUTH ONE TIME DAILY  . mometasone (NASONEX) 50 MCG/ACT nasal  spray Place 2 sprays into the nose daily as needed.  . moxifloxacin (AVELOX) 400 MG tablet Take 1 tablet (400 mg total) by mouth daily.  . psyllium (METAMUCIL) 58.6 % powder Take 1 packet by mouth daily.    [name] Kitchen Respiratory Therapy Supplies (FLUTTER) DEVI Take as directed two to three times daily as needed  . SYMBICORT 160-4.5 MCG/ACT inhaler INHALE TWO PUFFS BY MOUTH EVERY TWELVE HOURS  . tobramycin-dexamethasone (TOBRADEX) ophthalmic solution Place 1 drop into the right eye 4 (four) times daily.  . [DISCONTINUED] levofloxacin (LEVAQUIN) 750 MG tablet Take 1 tablet (750 mg total) by mouth daily.  [name] Kitchen tiotropium (SPIRIVA HANDIHALER) 18 MCG inhalation capsule Place 1 capsule (18 mcg total) into inhaler and inhale daily.

## 2013-04-13 NOTE — Progress Notes (Signed)
Quick Note:  Called, spoke with pt. Informed him sputum cultures are normal per PW. He verbalized understanding of this and voiced no further questions or concerns at this time. ______

## 2013-04-14 LAB — RESPIRATORY CULTURE OR RESPIRATORY AND SPUTUM CULTURE: Organism ID, Bacteria: NORMAL

## 2013-04-26 ENCOUNTER — Other Ambulatory Visit: Payer: Self-pay | Admitting: Critical Care Medicine

## 2013-04-26 ENCOUNTER — Telehealth: Payer: Self-pay | Admitting: Critical Care Medicine

## 2013-04-26 MED ORDER — TIOTROPIUM BROMIDE MONOHYDRATE 18 MCG IN CAPS: 18.0000 ug | ORAL_CAPSULE | Freq: Every day | RESPIRATORY_TRACT | Status: DC

## 2013-04-26 NOTE — Telephone Encounter (Signed)
I called and made pt aware rx has been sent. Nothing further needed

## 2013-05-06 ENCOUNTER — Other Ambulatory Visit: Payer: Self-pay | Admitting: Internal Medicine

## 2013-05-09 NOTE — Telephone Encounter (Signed)
Clotrimazole-betamethasone cream refilled per protocol

## 2013-05-21 ENCOUNTER — Other Ambulatory Visit: Payer: Self-pay | Admitting: Internal Medicine

## 2013-05-24 NOTE — Telephone Encounter (Signed)
30 day supply of Allopurinol sent. Pt needs OV. JG//CMA

## 2013-06-29 ENCOUNTER — Ambulatory Visit (INDEPENDENT_AMBULATORY_CARE_PROVIDER_SITE_OTHER): Payer: Medicare Other | Admitting: Critical Care Medicine

## 2013-06-29 ENCOUNTER — Encounter: Payer: Self-pay | Admitting: Critical Care Medicine

## 2013-06-29 ENCOUNTER — Ambulatory Visit (INDEPENDENT_AMBULATORY_CARE_PROVIDER_SITE_OTHER)
Admission: RE | Admit: 2013-06-29 | Discharge: 2013-06-29 | Disposition: A | Discharge status: Home / Self Care | Payer: Medicare Other | Source: Ambulatory Visit | Attending: Critical Care Medicine | Admitting: Critical Care Medicine

## 2013-06-29 VITALS — BP 114/80 | HR 80 | Temp 98.5°F | Ht 70.5 in | Wt 216.0 lb

## 2013-06-29 DIAGNOSIS — J111 Influenza due to unidentified influenza virus with other respiratory manifestations: Secondary | ICD-10-CM

## 2013-06-29 DIAGNOSIS — J449 Chronic obstructive pulmonary disease, unspecified: Secondary | ICD-10-CM | POA: Diagnosis not present

## 2013-06-29 DIAGNOSIS — J209 Acute bronchitis, unspecified: Secondary | ICD-10-CM

## 2013-06-29 MED ORDER — OSELTAMIVIR PHOSPHATE 75 MG PO CAPS: 75.0000 mg | ORAL_CAPSULE | Freq: Two times a day (BID) | ORAL | Status: DC

## 2013-06-29 MED ORDER — MOXIFLOXACIN HCL 400 MG PO TABS: 400.0000 mg | ORAL_TABLET | Freq: Every day | ORAL | Status: DC

## 2013-06-29 NOTE — Progress Notes (Signed)
Subjective:    Patient ID: Edwin [name], male    DOB: 1941-05-13, 73 y.o.   MRN: [id number]  HPI  73 y.o.  WM quit smoking 1985 with min airflow obstruction GOLD A 01/08/11 followed in pulmonary clinic for recurrent bronchitis.   A1AT normal, Ig levels normal, Allergy profile normal  CT chest 10/12  Mild right posterior basilar interstitial reticulation. No evidence for bronchiectasis.  CT Sinus: mild max sinusitis bilateral  On Dexilant empiric for GERD  06/29/2013 Chief Complaint  Patient presents with  . Acute Visit    Presents today with body aches, SOB, chest tightness and coughing with production of yellow mucus. Onset was 2 days ago.   Pt aching all over, headache, hard to breath, going into the chest. No fever, did have a fever.  Started getting ill two days ago.   No real chest pain. Just feels heavy.  Notes some pndrip.  R ear hurts.  Mucus now is yellow.     Review of Systems  Constitutional:   No  weight loss, +++night sweats,  +++Fevers, chills, fatigue, lassitude. HEENT:   No headaches, +++ Difficulty swallowing,  Tooth/dental problems,  Sore throat,                No sneezing, itching, ear ache, nasal congestion, post nasal drip,   CV:  No chest pain,  Orthopnea, PND, swelling in lower extremities, anasarca, dizziness, palpitations  GI  No heartburn, indigestion, abdominal pain, nausea, vomiting, diarrhea, change in bowel habits, loss of appetite  Resp: Notes  shortness of breath with exertion not  at rest.  No excess mucus, notes productive cough,  Notes  non-productive cough,  No coughing up of blood.  No change in color of mucus.  No wheezing.  No chest wall deformity  Skin: no rash or lesions.  GU: no dysuria, change in color of urine, no urgency or frequency.  No flank pain.  MS:  No joint pain or swelling.  No decreased range of motion.  No back pain.  Psych:  No change in mood or affect. No depression or anxiety.  No memory loss.     Objective:   Physical Exam  Filed Vitals:   06/29/13 1017 06/29/13 1019  BP:  114/80  Pulse:  80  Temp: 98.5 F (36.9 C)   TempSrc: Oral   Height: 5' 10.5" (1.791 m)   Weight: 216 lb (97.977 kg)   SpO2:  94%    Gen: Pleasant, well-nourished, in no distress,  normal affect  ENT: No lesions,  mouth clear,  oropharynx clear, no postnasal drip  Neck: No JVD, no TMG, no carotid bruits  Lungs: No use of accessory muscles, no dullness to percussion, expired wheezes scattered rhonchi right lower lobe  Cardiovascular: RRR, heart sounds normal, no murmur or gallops, no peripheral edema  Abdomen: soft and NT, no HSM,  BS normal  Musculoskeletal: No deformities, no cyanosis or clubbing  Neuro: alert, non focal  Skin: Warm, no lesions or rashes  Dg Chest 2 View  06/29/2013   CLINICAL DATA:  Acute bronchitis.  Cough and fever  EXAM: CHEST  2 VIEW  COMPARISON:  04/26/2012  FINDINGS: The heart size and mediastinal contours are within normal limits. Both lungs are clear. The visualized skeletal structures are unremarkable.  IMPRESSION: No active cardiopulmonary disease.   Electronically Signed   By: Franchot Gallo M.D.   On: 06/29/2013 13:38       Assessment & Plan:  Obstructive chronic bronchitis without exacerbation Gold Stage A COPD Gold stage A  COPD with recurrent bronchitis now with probable acute influenza respiratory infection without evidence of pneumonia on chest x-ray Plan Avelox one daily for 7days Tamiflu one twice daily for 5 days Return 2 weeks     Updated Medication List Outpatient Encounter Prescriptions as of 06/29/2013  Medication Sig  . albuterol (PROVENTIL HFA) 108 (90 BASE) MCG/ACT inhaler Inhale 2 puffs into the lungs every 4 (four) hours as needed for wheezing or shortness of breath. 1-2 puffs every 4 hrs as needed.  [name] Kitchen allopurinol (ZYLOPRIM) 300 MG tablet Take one tablet by mouth one time daily  . bimatoprost (LUMIGAN) 0.03 % ophthalmic solution Place 1 drop into both  eyes at bedtime.    . brimonidine (ALPHAGAN P) 0.1 % SOLN 1 drop 2 (two) times daily. Both eyes   . clotrimazole-betamethasone (LOTRISONE) cream Apply to affected area  twice daily  . DEXILANT 60 MG capsule Take one capsule by mouth one time daily  . FLUoxetine (PROZAC) 10 MG capsule TAKE ONE CAPSULE BY MOUTH ONE TIME DAILY   . Glucosamine-Chondroit-Vit C-Mn (GLUCOSAMINE 1500 COMPLEX PO) Take 2 tablets by mouth daily.    [name] Kitchen losartan (COZAAR) 100 MG tablet TAKE ONE TABLET BY MOUTH ONE TIME DAILY  . mometasone (NASONEX) 50 MCG/ACT nasal spray Place 2 sprays into the nose daily as needed.  . psyllium (METAMUCIL) 58.6 % powder Take 1 packet by mouth daily.    [name] Kitchen Respiratory Therapy Supplies (FLUTTER) DEVI Take as directed two to three times daily as needed  . SYMBICORT 160-4.5 MCG/ACT inhaler INHALE TWO PUFFS BY MOUTH EVERY TWELVE HOURS  . tiotropium (SPIRIVA HANDIHALER) 18 MCG inhalation capsule Place 1 capsule (18 mcg total) into inhaler and inhale daily.  [name] Kitchen tobramycin-dexamethasone (TOBRADEX) ophthalmic solution Place 1 drop into the right eye 4 (four) times daily.  . [DISCONTINUED] HYDROcodone-homatropine (HYCODAN) 5-1.5 MG/5ML syrup Take 5 mLs by mouth every 6 (six) hours as needed for cough.  . [DISCONTINUED] moxifloxacin (AVELOX) 400 MG tablet Take 1 tablet (400 mg total) by mouth daily.  [name] Kitchen moxifloxacin (AVELOX) 400 MG tablet Take 1 tablet (400 mg total) by mouth daily.  [name] Kitchen oseltamivir (TAMIFLU) 75 MG capsule Take 1 capsule (75 mg total) by mouth 2 (two) times daily.

## 2013-06-29 NOTE — Patient Instructions (Signed)
Avelox one daily for 7days Tamiflu one twice daily for 5 days Return 2 weeks

## 2013-06-30 NOTE — Assessment & Plan Note (Signed)
Gold stage A  COPD with recurrent bronchitis now with probable acute influenza respiratory infection without evidence of pneumonia on chest x-ray Plan Avelox one daily for 7days Tamiflu one twice daily for 5 days Return 2 weeks

## 2013-07-01 ENCOUNTER — Telehealth: Payer: Self-pay | Admitting: Critical Care Medicine

## 2013-07-01 NOTE — Telephone Encounter (Signed)
Continue abx as ordered If unimproving will need to go to ED

## 2013-07-01 NOTE — Telephone Encounter (Signed)
I called and spoke with spouse and made aware of recs. Nothing further needed

## 2013-07-01 NOTE — Telephone Encounter (Signed)
Called and spoke with spouse. Pt is still having chills, fever of 101-103.0, nausea, prod cough w/ yellow phlem. He is still taking the tamiflu and avelox. Please advise Dr. Joya Gaskins thanks  Allergies  Allergen Reactions  . Ramipril     REACTION: chest congestion & cough. No angioedema

## 2013-07-02 ENCOUNTER — Other Ambulatory Visit: Payer: Self-pay | Admitting: Internal Medicine

## 2013-07-04 NOTE — Telephone Encounter (Signed)
Allopurinol and Symbicort refilled per protocol. OV due. JG//CMA

## 2013-07-06 ENCOUNTER — Other Ambulatory Visit: Payer: Self-pay | Admitting: Internal Medicine

## 2013-07-07 NOTE — Telephone Encounter (Signed)
OV needed for refills.

## 2013-07-11 ENCOUNTER — Other Ambulatory Visit: Payer: Self-pay | Admitting: Internal Medicine

## 2013-07-12 ENCOUNTER — Encounter: Payer: Self-pay | Admitting: Critical Care Medicine

## 2013-07-12 ENCOUNTER — Ambulatory Visit (INDEPENDENT_AMBULATORY_CARE_PROVIDER_SITE_OTHER): Payer: Medicare Other | Admitting: Critical Care Medicine

## 2013-07-12 VITALS — BP 142/80 | HR 72 | Temp 98.5°F | Ht 70.5 in | Wt 213.0 lb

## 2013-07-12 DIAGNOSIS — J449 Chronic obstructive pulmonary disease, unspecified: Secondary | ICD-10-CM

## 2013-07-12 NOTE — Patient Instructions (Signed)
No change in medications. Return in   3 months 

## 2013-07-12 NOTE — Progress Notes (Signed)
Subjective:    Patient ID: Edwin [name], male    DOB: 1940-08-26, 73 y.o.   MRN: [id number]  HPI  73 y.o.  WM quit smoking 1985 with min airflow obstruction GOLD A 01/08/11 followed in pulmonary clinic for recurrent bronchitis.   A1AT normal, Ig levels normal, Allergy profile normal  CT chest 10/12  Mild right posterior basilar interstitial reticulation. No evidence for bronchiectasis.  CT Sinus: mild max sinusitis bilateral  On Dexilant empiric for GERD  07/12/2013 Chief Complaint  Patient presents with  . 2 wk follow up    Feeling much better.  Still coughing up clear to yellow mucus.  SOB is back to baseline.  No wheezing, chest tightness, or f/c/s.  Pt with ILI and bronchitis at last OV in 06/2013: Rx avelox/tamiflu. Feels better now. Pt was ill for 5-6 days .  Temp 103 now normal.  Still yellow mucus, sl more than normal.  No change in dyspnea.  No chest pain.  Sinus are ok.  Uses nasonex daily   Review of Systems  Constitutional:   No  weight loss, +++night sweats,  +++Fevers, chills, fatigue, lassitude. HEENT:   No headaches, +++ Difficulty swallowing,  Tooth/dental problems,  Sore throat,                No sneezing, itching, ear ache, nasal congestion, post nasal drip,   CV:  No chest pain,  Orthopnea, PND, swelling in lower extremities, anasarca, dizziness, palpitations  GI  No heartburn, indigestion, abdominal pain, nausea, vomiting, diarrhea, change in bowel habits, loss of appetite  Resp: Notes  shortness of breath with exertion not  at rest.  No excess mucus, notes productive cough,  Notes  non-productive cough,  No coughing up of blood.  No change in color of mucus.  No wheezing.  No chest wall deformity  Skin: no rash or lesions.  GU: no dysuria, change in color of urine, no urgency or frequency.  No flank pain.  MS:  No joint pain or swelling.  No decreased range of motion.  No back pain.  Psych:  No change in mood or affect. No depression or anxiety.  No memory  loss.     Objective:   Physical Exam  Filed Vitals:   07/12/13 0934  BP: 142/80  Pulse: 72  Temp: 98.5 F (36.9 C)  TempSrc: Oral  Height: 5' 10.5" (1.791 m)  Weight: 213 lb (96.616 kg)  SpO2: 96%    Gen: Pleasant, well-nourished, in no distress,  normal affect  ENT: No lesions,  mouth clear,  oropharynx clear, no postnasal drip  Neck: No JVD, no TMG, no carotid bruits  Lungs: No use of accessory muscles, no dullness to percussion, resolved rhonchi and wheezes   Cardiovascular: RRR, heart sounds normal, no murmur or gallops, no peripheral edema  Abdomen: soft and NT, no HSM,  BS normal  Musculoskeletal: No deformities, no cyanosis or clubbing  Neuro: alert, non focal  Skin: Warm, no lesions or rashes  No results found.     Assessment & Plan:   Obstructive chronic bronchitis without exacerbation Gold Stage A COPD Copd Gold A  Recent Flu like illness responded to tamiflu and avelox, now resolved Plan Maintain curr inhaled meds Return 3 months    Updated Medication List Outpatient Encounter Prescriptions as of 07/12/2013  Medication Sig  . albuterol (PROVENTIL HFA) 108 (90 BASE) MCG/ACT inhaler Inhale 2 puffs into the lungs every 4 (four) hours as needed for wheezing or  shortness of breath. 1-2 puffs every 4 hrs as needed.  [name] Kitchen allopurinol (ZYLOPRIM) 300 MG tablet TAKE ONE TABLET BY MOUTH ONE TIME DAILY   . bimatoprost (LUMIGAN) 0.03 % ophthalmic solution Place 1 drop into both eyes at bedtime.    . brimonidine (ALPHAGAN P) 0.1 % SOLN 1 drop 2 (two) times daily. Both eyes   . clotrimazole-betamethasone (LOTRISONE) cream Apply to affected area  twice daily  . DEXILANT 60 MG capsule Take one capsule by mouth one time daily  . Glucosamine-Chondroit-Vit C-Mn (GLUCOSAMINE 1500 COMPLEX PO) Take 2 tablets by mouth daily.    [name] Kitchen losartan (COZAAR) 100 MG tablet Take one tablet by mouth one time daily  . mometasone (NASONEX) 50 MCG/ACT nasal spray Place 2 sprays into the  nose daily as needed.  . psyllium (METAMUCIL) 58.6 % powder Take 1 packet by mouth daily.    [name] Kitchen Respiratory Therapy Supplies (FLUTTER) DEVI Take as directed two to three times daily as needed  . SYMBICORT 160-4.5 MCG/ACT inhaler INHALE TWO PUFFS BY MOUTH EVERY TWELVE HOURS   . tiotropium (SPIRIVA HANDIHALER) 18 MCG inhalation capsule Place 1 capsule (18 mcg total) into inhaler and inhale daily.  [name] Kitchen tobramycin-dexamethasone (TOBRADEX) ophthalmic solution Place 1 drop into the right eye 4 (four) times daily.  . [DISCONTINUED] FLUoxetine (PROZAC) 10 MG capsule TAKE ONE CAPSULE BY MOUTH ONE TIME DAILY   . [DISCONTINUED] moxifloxacin (AVELOX) 400 MG tablet Take 1 tablet (400 mg total) by mouth daily.  . [DISCONTINUED] oseltamivir (TAMIFLU) 75 MG capsule Take 1 capsule (75 mg total) by mouth 2 (two) times daily.

## 2013-07-12 NOTE — Telephone Encounter (Signed)
Rx sent to the pharmacy by e-script.//AB/CMA 

## 2013-07-13 ENCOUNTER — Ambulatory Visit: Payer: Medicare Other | Admitting: Critical Care Medicine

## 2013-07-13 NOTE — Assessment & Plan Note (Signed)
Copd Gold A  Recent Flu like illness responded to tamiflu and avelox, now resolved Plan Maintain curr inhaled meds Return 3 months

## 2013-07-22 ENCOUNTER — Ambulatory Visit (INDEPENDENT_AMBULATORY_CARE_PROVIDER_SITE_OTHER)
Admission: RE | Admit: 2013-07-22 | Discharge: 2013-07-22 | Disposition: A | Discharge status: Home / Self Care | Payer: Medicare Other | Source: Ambulatory Visit | Attending: Critical Care Medicine | Admitting: Critical Care Medicine

## 2013-07-22 ENCOUNTER — Telehealth: Payer: Self-pay | Admitting: Critical Care Medicine

## 2013-07-22 ENCOUNTER — Other Ambulatory Visit: Payer: Medicare Other

## 2013-07-22 ENCOUNTER — Encounter: Payer: Self-pay | Admitting: Critical Care Medicine

## 2013-07-22 ENCOUNTER — Ambulatory Visit (INDEPENDENT_AMBULATORY_CARE_PROVIDER_SITE_OTHER): Payer: Medicare Other | Admitting: Critical Care Medicine

## 2013-07-22 VITALS — BP 154/82 | HR 80 | Temp 97.9°F | Ht 70.5 in | Wt 214.6 lb

## 2013-07-22 DIAGNOSIS — J449 Chronic obstructive pulmonary disease, unspecified: Secondary | ICD-10-CM

## 2013-07-22 DIAGNOSIS — R0989 Other specified symptoms and signs involving the circulatory and respiratory systems: Secondary | ICD-10-CM | POA: Diagnosis not present

## 2013-07-22 MED ORDER — PREDNISONE 10 MG PO TABS: ORAL_TABLET | ORAL | Status: DC

## 2013-07-22 MED ORDER — MOXIFLOXACIN HCL 400 MG PO TABS: 400.0000 mg | ORAL_TABLET | Freq: Every day | ORAL | Status: DC

## 2013-07-22 NOTE — Patient Instructions (Addendum)
Avelox one daily for 7days Prednisone 10mg  Take 4 for three days 3 for three days 2 for three days 1 for three days and stop A Sputum culture was obtained Stay on spiriva and symbicort Return 2 weeks [place] [facility]

## 2013-07-22 NOTE — Progress Notes (Signed)
Subjective:    Patient ID: Edwin [name], male    DOB: [birth year], 73 y.o.   MRN: [id number]  HPI  73 y.o.  WM quit smoking 1985 with min airflow obstruction GOLD A 01/08/11 followed in pulmonary clinic for recurrent bronchitis.   A1AT normal, Ig levels normal, Allergy profile normal  CT chest 10/12  Mild right posterior basilar interstitial reticulation. No evidence for bronchiectasis.  CT Sinus: mild max sinusitis bilateral  On Dexilant empiric for GERD  07/12/2013 Chief Complaint  Patient presents with  . 2 wk follow up    Feeling much better.  Still coughing up clear to yellow mucus.  SOB is back to baseline.  No wheezing, chest tightness, or f/c/s.  Pt with ILI and bronchitis at last OV in 06/2013: Rx avelox/tamiflu. Feels better now. Pt was ill for 5-6 days .  Temp 103 now normal.  Still yellow mucus, sl more than normal.  No change in dyspnea.  No chest pain.  Sinus are ok.  Uses nasonex daily  07/22/2013 Chief Complaint  Patient presents with  . Acute Visit    With CXR. Productive cough with green mucous. Head and chest congestion. Dyspnea "about the same. "  Notes more green mucus head and chest . Pt had sore throat over weekend, then worsened.  No fever.  No chest pain. SOme dyspnea with exertion, cough spell wears the pt out  Review of Systems  Constitutional:   No  weight loss, +++night sweats,  +++Fevers, chills, fatigue, lassitude. HEENT:   No headaches, +++ Difficulty swallowing,  Tooth/dental problems,  Sore throat,                No sneezing, itching, ear ache, nasal congestion, post nasal drip,   CV:  No chest pain,  Orthopnea, PND, swelling in lower extremities, anasarca, dizziness, palpitations  GI  No heartburn, indigestion, abdominal pain, nausea, vomiting, diarrhea, change in bowel habits, loss of appetite  Resp: Notes  shortness of breath with exertion not  at rest.  No excess mucus, notes productive cough,  Notes  non-productive cough,  No coughing up of  blood.  No change in color of mucus.  No wheezing.  No chest wall deformity  Skin: no rash or lesions.  GU: no dysuria, change in color of urine, no urgency or frequency.  No flank pain.  MS:  No joint pain or swelling.  No decreased range of motion.  No back pain.  Psych:  No change in mood or affect. No depression or anxiety.  No memory loss.     Objective:   Physical Exam  Filed Vitals:   07/22/13 1559  BP: 154/82  Pulse: 80  Temp: 97.9 F (36.6 C)  TempSrc: Oral  Height: 5' 10.5" (1.791 m)  Weight: 214 lb 9.6 oz (97.342 kg)  SpO2: 96%    Gen: Pleasant, well-nourished, in no distress,  normal affect  ENT: No lesions,  mouth clear,  oropharynx clear, no postnasal drip  Neck: No JVD, no TMG, no carotid bruits  Lungs: No use of accessory muscles, no dullness to percussion, resolved rhonchi and wheezes   Cardiovascular: RRR, heart sounds normal, no murmur or gallops, no peripheral edema  Abdomen: soft and NT, no HSM,  BS normal  Musculoskeletal: No deformities, no cyanosis or clubbing  Neuro: alert, non focal  Skin: Warm, no lesions or rashes  Dg Chest 2 View  07/22/2013   CLINICAL DATA:  Cough, congestion, flu  EXAM: CHEST  2 VIEW  COMPARISON:  06/29/2013  FINDINGS: Cardiomediastinal silhouette is stable. No acute infiltrate or pleural effusion. No pulmonary edema. Mild degenerative changes thoracic spine.  IMPRESSION: No active cardiopulmonary disease.   Electronically Signed   By: Lahoma Crocker M.D.   On: 07/22/2013 16:04       Assessment & Plan:   Obstructive chronic bronchitis without exacerbation Gold Stage A COPD Chronic obstructive lung disease gold stage A  recent flulike illness now with post flu bronchiolitis on a recurrent basis Plan Avelox one daily for 7days Prednisone 10mg  Take 4 for three days 3 for three days 2 for three days 1 for three days and stop A Sputum culture was obtained Stay on spiriva and symbicort Return 2 weeks [place]  office     Updated Medication List Outpatient Encounter Prescriptions as of 07/22/2013  Medication Sig  . albuterol (PROVENTIL HFA) 108 (90 BASE) MCG/ACT inhaler Inhale 2 puffs into the lungs every 4 (four) hours as needed for wheezing or shortness of breath. 1-2 puffs every 4 hrs as needed.  [name] Kitchen allopurinol (ZYLOPRIM) 300 MG tablet TAKE ONE TABLET BY MOUTH ONE TIME DAILY   . bimatoprost (LUMIGAN) 0.03 % ophthalmic solution Place 1 drop into both eyes at bedtime.    . brimonidine (ALPHAGAN P) 0.1 % SOLN 1 drop 2 (two) times daily. Both eyes   . clotrimazole-betamethasone (LOTRISONE) cream Apply to affected area  twice daily prn  . DEXILANT 60 MG capsule Take one capsule by mouth one time daily  . FLUoxetine (PROZAC) 10 MG capsule PT NEEDS OFFICE VISIT.  TAKE 1 CAPSULE BY MOUTH ONE TIME DAILY.  [name] Kitchen Glucosamine-Chondroit-Vit C-Mn (GLUCOSAMINE 1500 COMPLEX PO) Take 2 tablets by mouth daily.    [name] Kitchen losartan (COZAAR) 100 MG tablet Take one tablet by mouth one time daily  . mometasone (NASONEX) 50 MCG/ACT nasal spray Place 2 sprays into the nose daily as needed.  . psyllium (METAMUCIL) 58.6 % powder Take 1 packet by mouth daily.    [name] Kitchen Respiratory Therapy Supplies (FLUTTER) DEVI Take as directed two to three times daily as needed  . SYMBICORT 160-4.5 MCG/ACT inhaler INHALE TWO PUFFS BY MOUTH EVERY TWELVE HOURS   . tiotropium (SPIRIVA HANDIHALER) 18 MCG inhalation capsule Place 1 capsule (18 mcg total) into inhaler and inhale daily.  [name] Kitchen tobramycin-dexamethasone (TOBRADEX) ophthalmic solution Place 1 drop into the right eye as needed.   . [DISCONTINUED] clotrimazole-betamethasone (LOTRISONE) cream Apply to affected area  twice daily  . moxifloxacin (AVELOX) 400 MG tablet Take 1 tablet (400 mg total) by mouth daily.  . predniSONE (DELTASONE) 10 MG tablet Take 4 for three days 3 for three days 2 for three days 1 for three days and stop

## 2013-07-22 NOTE — Telephone Encounter (Signed)
Spoke with pt and appt scheduled. Nothing further needed 

## 2013-07-23 NOTE — Assessment & Plan Note (Signed)
Chronic obstructive lung disease gold stage A  recent flulike illness now with post flu bronchiolitis on a recurrent basis Plan Avelox one daily for 7days Prednisone 10mg  Take 4 for three days 3 for three days 2 for three days 1 for three days and stop A Sputum culture was obtained Stay on spiriva and symbicort Return 2 weeks [place] [facility]

## 2013-07-25 LAB — LOWER RESPIRATORY CULTURE

## 2013-08-01 ENCOUNTER — Other Ambulatory Visit: Payer: Self-pay | Admitting: Internal Medicine

## 2013-08-03 ENCOUNTER — Telehealth: Payer: Self-pay | Admitting: Critical Care Medicine

## 2013-08-03 MED ORDER — AMOXICILLIN-POT CLAVULANATE 875-125 MG PO TABS: 1.0000 | ORAL_TABLET | Freq: Two times a day (BID) | ORAL | Status: DC

## 2013-08-03 NOTE — Telephone Encounter (Signed)
Spoke with pt. Was seen on 07/22/2013 by PW and was given abx and prednisone taper. He has since finished both medications. Reports that his cough has returned and is producing yellow mucus. PW advised pt to call back if his symptoms returned. Wants to know if he needs another round of prednisone and an abx.  [place] - please advise in PW's absence. Thanks.

## 2013-08-03 NOTE — Telephone Encounter (Signed)
I wonder how much of this is related to sinusitis?  Call in augmentin 875 bid with food and a large glass of water for 7 days.

## 2013-08-03 NOTE — Telephone Encounter (Signed)
Pt aware of recs. rx sent. Nothing further needed

## 2013-08-09 ENCOUNTER — Ambulatory Visit: Payer: Medicare Other | Admitting: Critical Care Medicine

## 2013-08-11 ENCOUNTER — Other Ambulatory Visit: Payer: Self-pay | Admitting: Internal Medicine

## 2013-08-28 ENCOUNTER — Other Ambulatory Visit: Payer: Self-pay | Admitting: Internal Medicine

## 2013-08-30 ENCOUNTER — Encounter: Payer: Self-pay | Admitting: Critical Care Medicine

## 2013-08-30 ENCOUNTER — Telehealth: Payer: Self-pay | Admitting: Critical Care Medicine

## 2013-08-30 ENCOUNTER — Ambulatory Visit (INDEPENDENT_AMBULATORY_CARE_PROVIDER_SITE_OTHER): Payer: Medicare Other | Admitting: Critical Care Medicine

## 2013-08-30 VITALS — BP 122/70 | HR 69 | Temp 97.7°F | Ht 70.5 in | Wt 215.4 lb

## 2013-08-30 DIAGNOSIS — M109 Gout, unspecified: Secondary | ICD-10-CM | POA: Diagnosis not present

## 2013-08-30 DIAGNOSIS — J449 Chronic obstructive pulmonary disease, unspecified: Secondary | ICD-10-CM | POA: Diagnosis not present

## 2013-08-30 MED ORDER — FLUOXETINE HCL 10 MG PO CAPS: ORAL_CAPSULE | ORAL | Status: DC

## 2013-08-30 MED ORDER — ALLOPURINOL 300 MG PO TABS: ORAL_TABLET | ORAL | Status: DC

## 2013-08-30 MED ORDER — DEXLANSOPRAZOLE 60 MG PO CPDR: DELAYED_RELEASE_CAPSULE | ORAL | Status: DC

## 2013-08-30 NOTE — Telephone Encounter (Signed)
Called and gave VO to Bl[facility] at [facility]. Nothing further needed

## 2013-08-30 NOTE — Telephone Encounter (Signed)
Epic was not working well this am  He needs prozac one daily #30 11 refills

## 2013-08-30 NOTE — Assessment & Plan Note (Signed)
Overall much improved Plan Cont inhaled medications No other changes

## 2013-08-30 NOTE — Progress Notes (Signed)
Subjective:    Patient ID: Edwin [name], male    DOB: 08/16/1940, 73 y.o.   MRN: [id number]  HPI  73 y.o.  WM quit smoking 1985 with min airflow obstruction GOLD A 01/08/11 followed in pulmonary clinic for recurrent bronchitis.   A1AT normal, Ig levels normal, Allergy profile normal  CT chest 10/12  Mild right posterior basilar interstitial reticulation. No evidence for bronchiectasis.  CT Sinus: mild max sinusitis bilateral  On Dexilant empiric for GERD  08/30/2013 Chief Complaint  Patient presents with  . Follow-up    Feeling much better - Occas prod cough (yellow) - SOB with activity - Denies wheezing or chest tightness  Pt still with a prod cough but is better.  8x a day. Small amount of sl yellow mucus. No pndrip. Pt remains hoarse.  No fever Pt had additional ABX issued over the phone augmentin 875bid x 7days.  Now is off  Review of Systems  Constitutional:   No  weight loss, night sweats,  No Fevers, chills, fatigue, lassitude. HEENT:   No headaches, no  Difficulty swallowing,  Tooth/dental problems,  Sore throat,                No sneezing, itching, ear ache, nasal congestion, post nasal drip,   CV:  No chest pain,  Orthopnea, PND, swelling in lower extremities, anasarca, dizziness, palpitations  GI  No heartburn, indigestion, abdominal pain, nausea, vomiting, diarrhea, change in bowel habits, loss of appetite  Resp: Notes  shortness of breath with exertion not  at rest.  No excess mucus, notes productive cough,  Notes  non-productive cough,  No coughing up of blood.  No change in color of mucus.  No wheezing.  No chest wall deformity  Skin: no rash or lesions.  GU: no dysuria, change in color of urine, no urgency or frequency.  No flank pain.  MS:  No joint pain or swelling.  No decreased range of motion.  No back pain.  Psych:  No change in mood or affect. No depression or anxiety.  No memory loss.     Objective:   Physical Exam  Filed Vitals:   08/30/13 0936   BP: 122/70  Pulse: 69  Temp: 97.7 F (36.5 C)  TempSrc: Oral  Height: 5' 10.5" (1.791 m)  Weight: 215 lb 6.4 oz (97.705 kg)  SpO2: 94%    Gen: Pleasant, well-nourished, in no distress,  normal affect  ENT: No lesions,  mouth clear,  oropharynx clear, no postnasal drip  Neck: No JVD, no TMG, no carotid bruits  Lungs: No use of accessory muscles, no dullness to percussion, resolved rhonchi and wheezes   Cardiovascular: RRR, heart sounds normal, no murmur or gallops, no peripheral edema  Abdomen: soft and NT, no HSM,  BS normal  Musculoskeletal: No deformities, no cyanosis or clubbing  Neuro: alert, non focal  Skin: Warm, no lesions or rashes  No results found.     Assessment & Plan:   Obstructive chronic bronchitis without exacerbation Gold Stage A COPD Overall much improved Plan Cont inhaled medications No other changes   GOUT Pt without PCP i refilled allopurinol    i also refilled losartan/antidepressant as pt has no PCP as of now  Updated Medication List Outpatient Encounter Prescriptions as of 08/30/2013  Medication Sig  . albuterol (PROVENTIL HFA) 108 (90 BASE) MCG/ACT inhaler Inhale 2 puffs into the lungs every 4 (four) hours as needed for wheezing or shortness of breath. 1-2 puffs every  4 hrs as needed.  [name] Kitchen allopurinol (ZYLOPRIM) 300 MG tablet TAKE ONE TABLET BY MOUTH ONE TIME DAILY  . bimatoprost (LUMIGAN) 0.03 % ophthalmic solution Place 1 drop into both eyes at bedtime.    . brimonidine (ALPHAGAN P) 0.1 % SOLN 1 drop 2 (two) times daily. Both eyes   . clotrimazole-betamethasone (LOTRISONE) cream Apply to affected area  twice daily prn  . dexlansoprazole (DEXILANT) 60 MG capsule Take one capsule by mouth one time daily  . Glucosamine-Chondroit-Vit C-Mn (GLUCOSAMINE 1500 COMPLEX PO) Take 2 tablets by mouth daily.    [name] Kitchen losartan (COZAAR) 100 MG tablet Take one tablet by mouth one time daily  . mometasone (NASONEX) 50 MCG/ACT nasal spray Place 2 sprays  into the nose daily as needed.  . psyllium (METAMUCIL) 58.6 % powder Take 1 packet by mouth daily.    [name] Kitchen Respiratory Therapy Supplies (FLUTTER) DEVI Take as directed two to three times daily as needed  . SYMBICORT 160-4.5 MCG/ACT inhaler INHALE TWO PUFFS BY MOUTH EVERY TWELVE HOURS   . tiotropium (SPIRIVA HANDIHALER) 18 MCG inhalation capsule Place 1 capsule (18 mcg total) into inhaler and inhale daily.  [name] Kitchen tobramycin-dexamethasone (TOBRADEX) ophthalmic solution Place 1 drop into the right eye as needed.   . [DISCONTINUED] allopurinol (ZYLOPRIM) 300 MG tablet TAKE ONE TABLET BY MOUTH ONE TIME DAILY   . [DISCONTINUED] DEXILANT 60 MG capsule Take one capsule by mouth one time daily  . [DISCONTINUED] FLUoxetine (PROZAC) 10 MG capsule TAKE ONE CAPSULE BY MOUTH ONE TIME DAILY. PATIENT NEEDS OFFICE VISIT.  . [DISCONTINUED] FLUoxetine (PROZAC) 10 MG capsule TAKE ONE CAPSULE BY MOUTH ONE TIME DAILY.  . [DISCONTINUED] amoxicillin-clavulanate (AUGMENTIN) 875-125 MG per tablet Take 1 tablet by mouth 2 (two) times daily.  . [DISCONTINUED] moxifloxacin (AVELOX) 400 MG tablet Take 1 tablet (400 mg total) by mouth daily.  . [DISCONTINUED] predniSONE (DELTASONE) 10 MG tablet Take 4 for three days 3 for three days 2 for three days 1 for three days and stop

## 2013-08-30 NOTE — Telephone Encounter (Signed)
Called spoke with [facility]. Was advised they received RX from [place] regarding pt prozac #21 x 30 refills. I see normally pt PCP refills this for pt. Please advise Dr. Joya Gaskins quantity and # of refills? thanks

## 2013-08-30 NOTE — Patient Instructions (Signed)
No change in medications. Return in    3 months           Use spacer device on symbicort

## 2013-08-30 NOTE — Assessment & Plan Note (Signed)
Pt without PCP i refilled allopurinol

## 2013-09-07 ENCOUNTER — Other Ambulatory Visit: Payer: Self-pay | Admitting: Internal Medicine

## 2013-09-07 ENCOUNTER — Telehealth: Payer: Self-pay | Admitting: *Deleted

## 2013-09-07 MED ORDER — BUDESONIDE-FORMOTEROL FUMARATE 160-4.5 MCG/ACT IN AERO: INHALATION_SPRAY | RESPIRATORY_TRACT | Status: DC

## 2013-09-07 NOTE — Telephone Encounter (Signed)
Called, spoke with pt. Informed him of below per Dr. Joya Gaskins.  He verbalized understanding. Pt needs refill for Symibort. Requesting this to be sent to [facility].  Rx sent - pt aware. He voiced no further questions or concerns at this time.

## 2013-09-07 NOTE — Telephone Encounter (Signed)
Rx denied it was filled on (09-07-13) by Dr. Saralyn Pilar Wright's office.//AB/CMA

## 2013-09-07 NOTE — Telephone Encounter (Signed)
Message copied by Adalberto Ill on Wed Sep 07, 2013 10:14 AM ------      Message from: Elsie Stain      Created: Sat Sep 03, 2013  8:35 AM       Let mr Novacek know he can call Dr Ronnald Ramp office for an appt            Let him know first available appt may be a while            pw      ----- Message -----         From: Janith Lima, MD         Sent: 09/02/2013  10:31 AM           To: Elsie Stain, MD            Darlyn Read      ----- Message -----         From: Elsie Stain, MD         Sent: 08/30/2013   9:48 AM           To: Janith Lima, MD            Tommy            Would you be willing to see Mr Sherrard as a primary care MD            Very nice pt of [facility].  He lives in [place]            Thank you for considering            pat             ------

## 2013-09-15 ENCOUNTER — Other Ambulatory Visit: Payer: Self-pay

## 2013-09-15 MED ORDER — LOSARTAN POTASSIUM 100 MG PO TABS: ORAL_TABLET | ORAL | Status: DC

## 2013-09-20 ENCOUNTER — Other Ambulatory Visit: Payer: Self-pay

## 2013-09-20 NOTE — Telephone Encounter (Signed)
Opened in error

## 2013-10-03 ENCOUNTER — Encounter: Payer: Self-pay | Admitting: Internal Medicine

## 2013-10-03 ENCOUNTER — Other Ambulatory Visit (INDEPENDENT_AMBULATORY_CARE_PROVIDER_SITE_OTHER): Payer: Medicare Other

## 2013-10-03 ENCOUNTER — Ambulatory Visit (INDEPENDENT_AMBULATORY_CARE_PROVIDER_SITE_OTHER): Payer: Medicare Other | Admitting: Internal Medicine

## 2013-10-03 VITALS — BP 128/82 | HR 68 | Temp 97.9°F | Resp 16 | Wt 214.0 lb

## 2013-10-03 DIAGNOSIS — N400 Enlarged prostate without lower urinary tract symptoms: Secondary | ICD-10-CM

## 2013-10-03 DIAGNOSIS — I10 Essential (primary) hypertension: Secondary | ICD-10-CM

## 2013-10-03 DIAGNOSIS — E786 Lipoprotein deficiency: Secondary | ICD-10-CM | POA: Diagnosis not present

## 2013-10-03 DIAGNOSIS — Z0000 Encounter for general adult medical examination without abnormal findings: Secondary | ICD-10-CM

## 2013-10-03 DIAGNOSIS — R7309 Other abnormal glucose: Secondary | ICD-10-CM

## 2013-10-03 DIAGNOSIS — K219 Gastro-esophageal reflux disease without esophagitis: Secondary | ICD-10-CM

## 2013-10-03 DIAGNOSIS — R7303 Prediabetes: Secondary | ICD-10-CM | POA: Insufficient documentation

## 2013-10-03 LAB — URINALYSIS, ROUTINE W REFLEX MICROSCOPIC
HGB URINE DIPSTICK: NEGATIVE
KETONES UR: NEGATIVE
Leukocytes, UA: NEGATIVE
Nitrite: NEGATIVE
Specific Gravity, Urine: >=1.03 — AB (ref 1.000–1.030)
Total Protein, Urine: NEGATIVE
URINE GLUCOSE: NEGATIVE
Urobilinogen, UA: 0.2 (ref 0.0–1.0)
pH: 5.5 (ref 5.0–8.0)

## 2013-10-03 LAB — CBC WITH DIFFERENTIAL/PLATELET
BASOS ABS: 0 10*3/uL (ref 0.0–0.1)
Basophils Relative: 0.5 % (ref 0.0–3.0)
EOS ABS: 0.2 10*3/uL (ref 0.0–0.7)
Eosinophils Relative: 2.5 % (ref 0.0–5.0)
HEMATOCRIT: 47.8 % (ref 39.0–52.0)
HEMOGLOBIN: 16.3 g/dL (ref 13.0–17.0)
LYMPHS PCT: 30.8 % (ref 12.0–46.0)
Lymphs Abs: 2.3 10*3/uL (ref 0.7–4.0)
MCHC: 34 g/dL (ref 30.0–36.0)
MCV: 94.6 fl (ref 78.0–100.0)
MONO ABS: 0.6 10*3/uL (ref 0.1–1.0)
Monocytes Relative: 8.6 % (ref 3.0–12.0)
Neutro Abs: 4.3 10*3/uL (ref 1.4–7.7)
Neutrophils Relative %: 57.6 % (ref 43.0–77.0)
Platelets: 165 10*3/uL (ref 150.0–400.0)
RBC: 5.05 Mil/uL (ref 4.22–5.81)
RDW: 15.3 % — AB (ref 11.5–14.6)
WBC: 7.4 10*3/uL (ref 4.5–10.5)

## 2013-10-03 LAB — PSA: PSA: 1.25 ng/mL (ref 0.10–4.00)

## 2013-10-03 LAB — COMPREHENSIVE METABOLIC PANEL
ALT: 34 U/L (ref 0–53)
AST: 43 U/L — ABNORMAL HIGH (ref 0–37)
Albumin: 4.5 g/dL (ref 3.5–5.2)
Alkaline Phosphatase: 49 U/L (ref 39–117)
BILIRUBIN TOTAL: 1 mg/dL (ref 0.3–1.2)
BUN: 15 mg/dL (ref 6–23)
CALCIUM: 9.9 mg/dL (ref 8.4–10.5)
CHLORIDE: 102 meq/L (ref 96–112)
CO2: 26 mEq/L (ref 19–32)
Creatinine, Ser: 1 mg/dL (ref 0.4–1.5)
GFR: 78.03 mL/min (ref >60.00)
Glucose, Bld: 110 mg/dL — ABNORMAL HIGH (ref 70–99)
Potassium: 4.2 mEq/L (ref 3.5–5.1)
Sodium: 137 mEq/L (ref 135–145)
Total Protein: 7.2 g/dL (ref 6.0–8.3)

## 2013-10-03 LAB — LIPID PANEL
CHOLESTEROL: 157 mg/dL (ref 0–200)
HDL: 48.1 mg/dL (ref >39.00)
LDL CALC: 84 mg/dL (ref 0–99)
Total CHOL/HDL Ratio: 3
Triglycerides: 124 mg/dL (ref 0.0–149.0)
VLDL: 24.8 mg/dL (ref 0.0–40.0)

## 2013-10-03 LAB — TSH: TSH: 1.37 u[IU]/mL (ref 0.35–5.50)

## 2013-10-03 LAB — FECAL OCCULT BLOOD, GUAIAC: Fecal Occult Blood: NEGATIVE

## 2013-10-03 LAB — HEMOGLOBIN A1C: Hgb A1c MFr Bld: 5.2 % (ref 4.6–6.5)

## 2013-10-03 NOTE — Patient Instructions (Signed)
Health Maintenance, Males A healthy lifestyle and preventative care can promote health and wellness.  Maintain regular health, dental, and eye exams.  Eat a healthy diet. Foods like vegetables, fruits, whole grains, low-fat dairy products, and lean protein foods contain the nutrients you need and are low in calories. Decrease your intake of foods high in solid fats, added sugars, and salt. Get information about a proper diet from your health care provider, if necessary.  Regular physical exercise is one of the most important things you can do for your health. Most adults should get at least 150 minutes of moderate-intensity exercise (any activity that increases your heart rate and causes you to sweat) each week. In addition, most adults need muscle-strengthening exercises on 2 or more days a week.   Maintain a healthy weight. The body mass index (BMI) is a screening tool to identify possible weight problems. It provides an estimate of body fat based on height and weight. Your health care provider can find your BMI and can help you achieve or maintain a healthy weight. For males 20 years and older:  A BMI below 18.5 is considered underweight.  A BMI of 18.5 to 24.9 is normal.  A BMI of 25 to 29.9 is considered overweight.  A BMI of 30 and above is considered obese.  Maintain normal blood lipids and cholesterol by exercising and minimizing your intake of saturated fat. Eat a balanced diet with plenty of fruits and vegetables. Blood tests for lipids and cholesterol should begin at age 20 and be repeated every 5 years. If your lipid or cholesterol levels are high, you are over 50, or you are at high risk for heart disease, you may need your cholesterol levels checked more frequently.Ongoing high lipid and cholesterol levels should be treated with medicines, if diet and exercise are not working.  If you smoke, find out from your health care provider how to quit. If you do not use tobacco, do not  start.  Lung cancer screening is recommended for adults aged 55 80 years who are at high risk for developing lung cancer because of a history of smoking. A yearly low-dose CT scan of the lungs is recommended for people who have at least a 30-pack-year history of smoking and are a current smoker or have quit within the past 15 years. A pack year of smoking is smoking an average of 1 pack of cigarettes a day for 1 year (for example, a 30-pack-year history of smoking could mean smoking 1 pack a day for 30 years or 2 packs a day for 15 years). Yearly screening should continue until the smoker has stopped smoking for at least 15 years. Yearly screening should be stopped for people who develop a health problem that would prevent them from having lung cancer treatment.  If you choose to drink alcohol, do not have more than 2 drinks per day. One drink is considered to be 12 oz (360 mL) of beer, 5 oz (150 mL) of wine, or 1.5 oz (45 mL) of liquor.  Avoid use of street drugs. Do not share needles with anyone. Ask for help if you need support or instructions about stopping the use of drugs.  High blood pressure causes heart disease and increases the risk of stroke. Blood pressure should be checked at least every 1 2 years. Ongoing high blood pressure should be treated with medicines if weight loss and exercise are not effective.  If you are 45 72 years old, ask your health   care provider if you should take aspirin to prevent heart disease.  Diabetes screening involves taking a blood sample to check your fasting blood sugar level. This should be done once every 3 years after age 45, if you are at a normal weight and without risk factors for diabetes. Testing should be considered at a younger age or be carried out more frequently if you are overweight and have at least 1 risk factor for diabetes.  Colorectal cancer can be detected and often prevented. Most routine colorectal cancer screening begins at the age of 50  and continues through age 75. However, your health care provider may recommend screening at an earlier age if you have risk factors for colon cancer. On a yearly basis, your health care provider may provide home test kits to check for hidden blood in the stool. A small camera at the end of a tube may be used to directly examine the colon (sigmoidoscopy or colonoscopy) to detect the earliest forms of colorectal cancer. Talk to your health care provider about this at age 50, when routine screening begins. A direct exam of the colon should be repeated every 5 10 years through age 75, unless early forms of pre-cancerous polyps or small growths are found.  People who are at an increased risk for hepatitis B should be screened for this virus. You are considered at high risk for hepatitis B if:  You were born in a country where hepatitis B occurs often. Talk with your health care provider about which countries are considered high-risk.  Your parents were born in a high-risk country and you have not received a shot to protect against hepatitis B (hepatitis B vaccine).  You have HIV or AIDS.  You use needles to inject street drugs.  You live with, or have sex with, someone who has hepatitis B.  You are a man who has sex with other men (MSM).  You get hemodialysis treatment.  You take certain medicines for conditions like cancer, organ transplantation, and autoimmune conditions.  Hepatitis C blood testing is recommended for all people born from 1945 through 1965 and any individual with known risk factors for hepatitis C.  Healthy men should no longer receive prostate-specific antigen (PSA) blood tests as part of routine cancer screening. Talk to your health care provider about prostate cancer screening.  Testicular cancer screening is not recommended for adolescents or adult males who have no symptoms. Screening includes self-exam, a health care provider exam, and other screening tests. Consult with  your health care provider about any symptoms you have or any concerns you have about testicular cancer.  Practice safe sex. Use condoms and avoid high-risk sexual practices to reduce the spread of sexually transmitted infections (STIs).  Use sunscreen. Apply sunscreen liberally and repeatedly throughout the day. You should seek shade when your shadow is shorter than you. Protect yourself by wearing long sleeves, pants, a wide-brimmed hat, and sunglasses year round, whenever you are outdoors.  Tell your health care provider of new moles or changes in moles, especially if there is a change in shape or color. Also tell your provider if a mole is larger than the size of a pencil eraser.  A one-time screening for abdominal aortic aneurysm (AAA) and surgical repair of large AAAs by ultrasound is recommended for men aged 65 75 years who are current or former smokers.  Stay current with your vaccines (immunizations). Document Released: 11/15/2007 Document Revised: 03/09/2013 Document Reviewed: 10/14/2010 Exit[employer] Patient Information 2014 [employer].   Hypertension As your heart beats, it forces blood through your arteries. This force is your blood pressure. If the pressure is too high, it is called hypertension (HTN) or high blood pressure. HTN is dangerous because you may have it and not know it. High blood pressure may mean that your heart has to work harder to pump blood. Your arteries may be narrow or stiff. The extra work puts you at risk for heart disease, stroke, and other problems.  Blood pressure consists of two numbers, a higher number over a lower, 110/72, for example. It is stated as "110 over 72." The ideal is below 120 for the top number (systolic) and under 80 for the bottom (diastolic). Write down your blood pressure today. You should pay close attention to your blood pressure if you have certain conditions such as:  Heart failure.  Prior heart attack.  Diabetes  Chronic kidney  disease.  Prior stroke.  Multiple risk factors for heart disease. To see if you have HTN, your blood pressure should be measured while you are seated with your arm held at the level of the heart. It should be measured at least twice. A one-time elevated blood pressure reading (especially in the Emergency Department) does not mean that you need treatment. There may be conditions in which the blood pressure is different between your right and left arms. It is important to see your caregiver soon for a recheck. Most people have essential hypertension which means that there is not a specific cause. This type of high blood pressure may be lowered by changing lifestyle factors such as:  Stress.  Smoking.  Lack of exercise.  Excessive weight.  Drug/tobacco/alcohol use.  Eating less salt. Most people do not have symptoms from high blood pressure until it has caused damage to the body. Effective treatment can often prevent, delay or reduce that damage. TREATMENT  When a cause has been identified, treatment for high blood pressure is directed at the cause. There are a large number of medications to treat HTN. These fall into several categories, and your caregiver will help you select the medicines that are best for you. Medications may have side effects. You should review side effects with your caregiver. If your blood pressure stays high after you have made lifestyle changes or started on medicines,   Your medication(s) may need to be changed.  Other problems may need to be addressed.  Be certain you understand your prescriptions, and know how and when to take your medicine.  Be sure to follow up with your caregiver within the time frame advised (usually within two weeks) to have your blood pressure rechecked and to review your medications.  If you are taking more than one medicine to lower your blood pressure, make sure you know how and at what times they should be taken. Taking two medicines  at the same time can result in blood pressure that is too low. SEEK IMMEDIATE MEDICAL CARE IF:  You develop a severe headache, blurred or changing vision, or confusion.  You have unusual weakness or numbness, or a faint feeling.  You have severe chest or abdominal pain, vomiting, or breathing problems. MAKE SURE YOU:   Understand these instructions.  Will watch your condition.  Will get help right away if you are not doing well or get worse. Document Released: 05/19/2005 Document Revised: 08/11/2011 Document Reviewed: 01/07/2008 ExitCare Patient Information 2014 [employer].  

## 2013-10-03 NOTE — Assessment & Plan Note (Signed)
His BP is well controlled Today I will check his lytes and renal function 

## 2013-10-03 NOTE — Assessment & Plan Note (Signed)
I will check his A1C to see if he has developed DM2 

## 2013-10-03 NOTE — Assessment & Plan Note (Signed)
He has no s/s that need to be treated PSA today

## 2013-10-03 NOTE — Progress Notes (Signed)
Pre-visit discussion using our clinic review tool, as applicable. No additional management support is needed unless otherwise documented below in the visit note.  

## 2013-10-03 NOTE — Progress Notes (Signed)
Subjective:    Patient ID: Edwin [name], male    DOB: 09-25-1940, 73 y.o.   MRN: [id number]  Hypertension This is a chronic problem. The current episode started more than 1 year ago. The problem has been gradually improving since onset. The problem is controlled. Pertinent negatives include no anxiety, blurred vision, chest pain, headaches, malaise/fatigue, neck pain, orthopnea, palpitations, peripheral edema, PND, shortness of breath or sweats. There are no associated agents to hypertension. Past treatments include angiotensin blockers. The current treatment provides moderate improvement. There are no compliance problems.       Review of Systems  Constitutional: Negative.  Negative for fever, chills, malaise/fatigue, diaphoresis, appetite change and fatigue.  HENT: Negative.   Eyes: Negative.  Negative for blurred vision.  Respiratory: Negative.  Negative for cough, choking, chest tightness, shortness of breath, wheezing and stridor.   Cardiovascular: Negative.  Negative for chest pain, palpitations, orthopnea, leg swelling and PND.  Gastrointestinal: Negative.  Negative for nausea, vomiting, abdominal pain, diarrhea, constipation and blood in stool.  Endocrine: Negative.   Genitourinary: Negative.   Musculoskeletal: Negative.  Negative for neck pain.  Skin: Negative.   Allergic/Immunologic: Negative.   Neurological: Negative.  Negative for dizziness, tremors, weakness, light-headedness and headaches.  Hematological: Negative.  Negative for adenopathy. Does not bruise/bleed easily.  Psychiatric/Behavioral: Negative.        Objective:   Physical Exam  Vitals reviewed. Constitutional: He is oriented to person, place, and time. He appears well-developed and well-nourished. No distress.  HENT:  Head: Normocephalic and atraumatic.  Mouth/Throat: Oropharynx is clear and moist. No oropharyngeal exudate.  Eyes: Conjunctivae are normal. Right eye exhibits no discharge. Left eye  exhibits no discharge. No scleral icterus.  Neck: Normal range of motion. Neck supple. No JVD present. No tracheal deviation present. No thyromegaly present.  Cardiovascular: Normal rate, regular rhythm, normal heart sounds and intact distal pulses.  Exam reveals no gallop and no friction rub.   No murmur heard. Pulmonary/Chest: Effort normal and breath sounds normal. No stridor. No respiratory distress. He has no wheezes. He has no rales. He exhibits no tenderness.  Abdominal: Soft. Bowel sounds are normal. He exhibits no distension and no mass. There is no tenderness. There is no rebound and no guarding. Hernia confirmed negative in the right inguinal area and confirmed negative in the left inguinal area.  Genitourinary: Rectum normal, testes normal and penis normal. Rectal exam shows no external hemorrhoid, no internal hemorrhoid, no fissure, no mass, no tenderness and anal tone normal. Guaiac negative stool. Prostate is enlarged (smooth symm 1+ BPH). Prostate is not tender. Right testis shows no mass, no swelling and no tenderness. Right testis is descended. Left testis shows no mass, no swelling and no tenderness. Left testis is descended. Circumcised. No penile erythema or penile tenderness. No discharge found.  Musculoskeletal: Normal range of motion. He exhibits no edema and no tenderness.  Lymphadenopathy:    He has no cervical adenopathy.       Right: No inguinal adenopathy present.       Left: No inguinal adenopathy present.  Neurological: He is oriented to person, place, and time.  Skin: Skin is warm and dry. No rash noted. He is not diaphoretic. No erythema. No pallor.  Psychiatric: He has a normal mood and affect. His behavior is normal. Judgment and thought content normal.    Lab Results  Component Value Date   WBC 8.0 05/13/2012   HGB 15.2 05/13/2012   HCT 45.0 05/13/2012  PLT 192.0 05/13/2012   GLUCOSE 107* 05/13/2012   CHOL 172 05/13/2012   TRIG 119.0 05/13/2012   HDL  46.70 05/13/2012   LDLCALC 102* 05/13/2012   ALT 30 05/13/2012   AST 30 05/13/2012   NA 136 05/13/2012   K 3.9 05/13/2012   CL 100 05/13/2012   CREATININE 1.1 05/13/2012   BUN 14 05/13/2012   CO2 27 05/13/2012   TSH 0.82 05/13/2012   PSA 1.82 05/13/2012   HGBA1C 6.1 05/13/2012       Assessment & Plan:

## 2013-10-03 NOTE — Assessment & Plan Note (Addendum)

## 2013-10-28 ENCOUNTER — Telehealth: Payer: Self-pay | Admitting: Critical Care Medicine

## 2013-10-28 MED ORDER — MOXIFLOXACIN HCL 400 MG PO TABS: 400.0000 mg | ORAL_TABLET | Freq: Every day | ORAL | Status: DC

## 2013-10-28 NOTE — Telephone Encounter (Signed)
Pt returning call.Stanley A Dalton ° °

## 2013-10-28 NOTE — Telephone Encounter (Signed)
lmomtcb x1 

## 2013-10-28 NOTE — Telephone Encounter (Signed)
Called and spoke with pt and he stated that he is having large amounts of green sputum since Wednesday.  Pt stated that this is the same that he had before.  Pt is requesting recs from PW.  Please advise. Thanks  Allergies  Allergen Reactions  . Ramipril     REACTION: chest congestion & cough. No angioedema     Current Outpatient Prescriptions on File Prior to Visit  Medication Sig Dispense Refill  . albuterol (PROVENTIL HFA) 108 (90 BASE) MCG/ACT inhaler Inhale 2 puffs into the lungs every 4 (four) hours as needed for wheezing or shortness of breath. 1-2 puffs every 4 hrs as needed.  1 Inhaler  0  . allopurinol (ZYLOPRIM) 300 MG tablet TAKE ONE TABLET BY MOUTH ONE TIME DAILY  30 tablet  6  . bimatoprost (LUMIGAN) 0.03 % ophthalmic solution Place 1 drop into both eyes at bedtime.        . brimonidine (ALPHAGAN P) 0.1 % SOLN 1 drop 2 (two) times daily. Both eyes       . budesonide-formoterol (SYMBICORT) 160-4.5 MCG/ACT inhaler INHALE TWO PUFFS BY MOUTH EVERY TWELVE HOURS  10.2 g  5  . clotrimazole-betamethasone (LOTRISONE) cream Apply to affected area  twice daily prn      . dexlansoprazole (DEXILANT) 60 MG capsule Take one capsule by mouth one time daily  30 capsule  11  . FLUoxetine (PROZAC) 10 MG capsule TAKE ONE CAPSULE BY MOUTH ONE TIME DAILY.  30 capsule  11  . Glucosamine-Chondroit-Vit C-Mn (GLUCOSAMINE 1500 COMPLEX PO) Take 2 tablets by mouth daily.        [name] Kitchen losartan (COZAAR) 100 MG tablet Take one tablet by mouth one time daily  30 tablet  0  . mometasone (NASONEX) 50 MCG/ACT nasal spray Place 2 sprays into the nose daily as needed.  17 g  11  . psyllium (METAMUCIL) 58.6 % powder Take 1 packet by mouth daily.        [name] Kitchen Respiratory Therapy Supplies (FLUTTER) DEVI Take as directed two to three times daily as needed      . tiotropium (SPIRIVA HANDIHALER) 18 MCG inhalation capsule Place 1 capsule (18 mcg total) into inhaler and inhale daily.  30 capsule  11   No current  facility-administered medications on file prior to visit.

## 2013-10-28 NOTE — Telephone Encounter (Signed)
Spoke with patient-aware of Rx per PW; I have sent to [facility]. Nothing more needed at this time.

## 2013-10-28 NOTE — Telephone Encounter (Signed)
Call in Avelox 400mg  /d x 7days

## 2013-11-09 DIAGNOSIS — H4011X0 Primary open-angle glaucoma, stage unspecified: Secondary | ICD-10-CM | POA: Diagnosis not present

## 2014-01-30 ENCOUNTER — Ambulatory Visit (INDEPENDENT_AMBULATORY_CARE_PROVIDER_SITE_OTHER): Payer: Medicare Other | Admitting: Internal Medicine

## 2014-01-30 ENCOUNTER — Encounter: Payer: Self-pay | Admitting: Internal Medicine

## 2014-01-30 VITALS — BP 122/78 | HR 75 | Temp 98.1°F | Resp 16 | Ht 70.5 in | Wt 213.0 lb

## 2014-01-30 DIAGNOSIS — T63481A Toxic effect of venom of other arthropod, accidental (unintentional), initial encounter: Secondary | ICD-10-CM | POA: Diagnosis not present

## 2014-01-30 DIAGNOSIS — L989 Disorder of the skin and subcutaneous tissue, unspecified: Secondary | ICD-10-CM

## 2014-01-30 DIAGNOSIS — T6391XA Toxic effect of contact with unspecified venomous animal, accidental (unintentional), initial encounter: Secondary | ICD-10-CM

## 2014-01-30 DIAGNOSIS — I10 Essential (primary) hypertension: Secondary | ICD-10-CM

## 2014-01-30 DIAGNOSIS — W57XXXA Bitten or stung by nonvenomous insect and other nonvenomous arthropods, initial encounter: Secondary | ICD-10-CM | POA: Insufficient documentation

## 2014-01-30 MED ORDER — HYDROXYZINE HCL 50 MG PO TABS: 50.0000 mg | ORAL_TABLET | Freq: Three times a day (TID) | ORAL | Status: DC | PRN

## 2014-01-30 NOTE — Patient Instructions (Signed)

## 2014-01-30 NOTE — Progress Notes (Signed)
Pre visit review using our clinic review tool, if applicable. No additional management support is needed unless otherwise documented below in the visit note. 

## 2014-01-31 MED ORDER — METHYLPREDNISOLONE ACETATE 80 MG/ML IJ SUSP
120.0000 mg | Freq: Once | INTRAMUSCULAR | Status: AC
  Administered 2014-01-30: 120 mg via INTRAMUSCULAR

## 2014-02-01 NOTE — Assessment & Plan Note (Signed)
Derm referral.

## 2014-02-01 NOTE — Assessment & Plan Note (Signed)
Will treat with an injection of depo-medrol IM and will use hydroxyzine for the itching

## 2014-02-01 NOTE — Assessment & Plan Note (Signed)
His BP is well controlled 

## 2014-02-01 NOTE — Progress Notes (Signed)
   Subjective:    Patient ID: Edwin [name], male    DOB: 30-May-1941, 73 y.o.   MRN: [id number]  Rash This is a new problem. The current episode started yesterday. The rash is diffuse. The rash is characterized by redness and itchiness. He was exposed to an insect bite/sting. Pertinent negatives include no anorexia, congestion, cough, diarrhea, eye pain, facial edema, fatigue, fever, joint pain, nail changes, rhinorrhea, shortness of breath, sore throat or vomiting. Past treatments include antihistamine. The treatment provided mild relief.      Review of Systems  Constitutional: Negative.  Negative for fever and fatigue.  HENT: Negative.  Negative for congestion, rhinorrhea and sore throat.   Eyes: Negative.  Negative for pain.  Respiratory: Negative.  Negative for apnea, cough, choking, chest tightness, shortness of breath, wheezing and stridor.   Cardiovascular: Negative.  Negative for chest pain, palpitations and leg swelling.  Gastrointestinal: Negative.  Negative for nausea, vomiting, abdominal pain, diarrhea, constipation and anorexia.  Endocrine: Negative.   Genitourinary: Negative.   Musculoskeletal: Negative.  Negative for arthralgias, back pain, joint pain, myalgias and neck pain.  Skin: Positive for color change and rash. Negative for nail changes, pallor and wound.  Allergic/Immunologic: Negative.   Neurological: Negative.   Hematological: Negative.  Negative for adenopathy. Does not bruise/bleed easily.  Psychiatric/Behavioral: Negative.        Objective:   Physical Exam  Vitals reviewed. Constitutional: He is oriented to person, place, and time. He appears well-developed and well-nourished. No distress.  HENT:  Head: Normocephalic and atraumatic.  Mouth/Throat: Oropharynx is clear and moist. No oropharyngeal exudate.  Eyes: Conjunctivae are normal. Right eye exhibits no discharge. Left eye exhibits no discharge. No scleral icterus.  Neck: Normal range of motion.  Neck supple. No JVD present. No tracheal deviation present. No thyromegaly present.  Cardiovascular: Normal rate, regular rhythm, normal heart sounds and intact distal pulses.  Exam reveals no gallop and no friction rub.   No murmur heard. Pulmonary/Chest: Effort normal and breath sounds normal. No stridor. No respiratory distress. He has no wheezes. He has no rales. He exhibits no tenderness.  Abdominal: Soft. Bowel sounds are normal. He exhibits no distension and no mass. There is no tenderness. There is no rebound and no guarding.  Musculoskeletal: Normal range of motion. He exhibits no edema and no tenderness.  Lymphadenopathy:    He has no cervical adenopathy.  Neurological: He is oriented to person, place, and time.  Skin: Skin is warm and dry. Rash noted. No purpura noted. Rash is papular. Rash is not macular, not maculopapular, not nodular, not pustular, not vesicular and not urticarial. He is not diaphoretic. No erythema. No pallor.  There are TNTC erythematous papules with a pale surrounding over the torso, abd, and extremities.          Assessment & Plan:

## 2014-02-13 DIAGNOSIS — L570 Actinic keratosis: Secondary | ICD-10-CM | POA: Diagnosis not present

## 2014-02-13 DIAGNOSIS — L820 Inflamed seborrheic keratosis: Secondary | ICD-10-CM | POA: Diagnosis not present

## 2014-02-13 DIAGNOSIS — D235 Other benign neoplasm of skin of trunk: Secondary | ICD-10-CM | POA: Diagnosis not present

## 2014-02-13 DIAGNOSIS — Z85828 Personal history of other malignant neoplasm of skin: Secondary | ICD-10-CM | POA: Diagnosis not present

## 2014-02-14 ENCOUNTER — Telehealth: Payer: Self-pay | Admitting: Critical Care Medicine

## 2014-02-14 MED ORDER — LEVOFLOXACIN 750 MG PO TABS: 750.0000 mg | ORAL_TABLET | Freq: Every day | ORAL | Status: DC

## 2014-02-14 NOTE — Telephone Encounter (Signed)
Called, spoke with pt -  Reports symptoms started with sore throat over the weekend.  Now has went into chest and has prod cough with a lot of yellow to light green mucus, more trouble breathing, and a little wheezing.  Symptoms worse qhs.  No chest tightness/pain or f/c/s.  Using symbicort and spiriva.  Took mucinex last night.  Has not needed to use rescue inhaler.  Is out of town - requesting abx.  States levaquin has been given in the past and has worked well.  Dr. Joya Gaskins is off.  Will send to doc of the day, Dr. Lenna Gilford, please advise.  Thank you.  Allergies  Allergen Reactions  . Ramipril     REACTION: chest congestion & cough. No angioedema    [facility] in [place] - # list above  Last OV with PW: 08/30/13

## 2014-02-14 NOTE — Telephone Encounter (Signed)
Per SN-okay Levaquin 750mg  #7 take 1 po QD no refills.

## 2014-02-14 NOTE — Telephone Encounter (Signed)
Spoke with the pt and notified of recs per SN  He verbalized understanding  Rx was sent to pharm

## 2014-03-06 DIAGNOSIS — Z23 Encounter for immunization: Secondary | ICD-10-CM | POA: Diagnosis not present

## 2014-03-18 ENCOUNTER — Other Ambulatory Visit: Payer: Self-pay | Admitting: Critical Care Medicine

## 2014-03-27 ENCOUNTER — Other Ambulatory Visit: Payer: Self-pay | Admitting: Critical Care Medicine

## 2014-04-04 DIAGNOSIS — H4011X2 Primary open-angle glaucoma, moderate stage: Secondary | ICD-10-CM | POA: Diagnosis not present

## 2014-04-04 DIAGNOSIS — Z961 Presence of intraocular lens: Secondary | ICD-10-CM | POA: Diagnosis not present

## 2014-04-04 DIAGNOSIS — H3531 Nonexudative age-related macular degeneration: Secondary | ICD-10-CM | POA: Diagnosis not present

## 2014-04-23 ENCOUNTER — Other Ambulatory Visit: Payer: Self-pay | Admitting: Critical Care Medicine

## 2014-04-24 ENCOUNTER — Telehealth: Payer: Self-pay | Admitting: Critical Care Medicine

## 2014-04-24 MED ORDER — PREDNISONE 10 MG PO TABS: ORAL_TABLET | ORAL | Status: DC

## 2014-04-24 MED ORDER — MOXIFLOXACIN HCL 400 MG PO TABS: 400.0000 mg | ORAL_TABLET | Freq: Every day | ORAL | Status: DC

## 2014-04-24 MED ORDER — BUDESONIDE-FORMOTEROL FUMARATE 160-4.5 MCG/ACT IN AERO: 2.0000 | INHALATION_SPRAY | Freq: Two times a day (BID) | RESPIRATORY_TRACT | Status: DC

## 2014-04-24 MED ORDER — TIOTROPIUM BROMIDE MONOHYDRATE 18 MCG IN CAPS: 18.0000 ug | ORAL_CAPSULE | Freq: Every day | RESPIRATORY_TRACT | Status: DC

## 2014-04-24 NOTE — Telephone Encounter (Signed)
Call in avelox 400mg  /d x 7 days Call in prednisone 10mg  Take 4 tablets daily for 5 days then stop #20

## 2014-04-24 NOTE — Telephone Encounter (Signed)
PT is leaving town tomorrow.  C/o prod cough (yellow), mild sob, chest congestion, rattling in chest.  Denies fever.  Please advise.

## 2014-04-24 NOTE — Telephone Encounter (Signed)
Spoke with pt, he is aware of PW's recs. Also requesting symbicort and spiriva refill. These have been sent in. Nothing further needed.

## 2014-05-31 ENCOUNTER — Telehealth: Payer: Self-pay | Admitting: *Deleted

## 2014-05-31 NOTE — Telephone Encounter (Signed)
Routed call to Dr. Ronnald Ramp.

## 2014-06-02 ENCOUNTER — Other Ambulatory Visit: Payer: Self-pay | Admitting: Internal Medicine

## 2014-06-02 MED ORDER — LOSARTAN POTASSIUM 100 MG PO TABS: ORAL_TABLET | ORAL | Status: DC

## 2014-07-16 IMAGING — CR DG CHEST 2V
2 series · 2 of 2 positions shown · non-contrast
Comparison: 11/14/2011

CLINICAL DATA: Chest congestion, shortness of breath and cough with
chest pain for 4 weeks.  COPD.

CHEST - 2 VIEW

[view not recorded (1 of 2)]
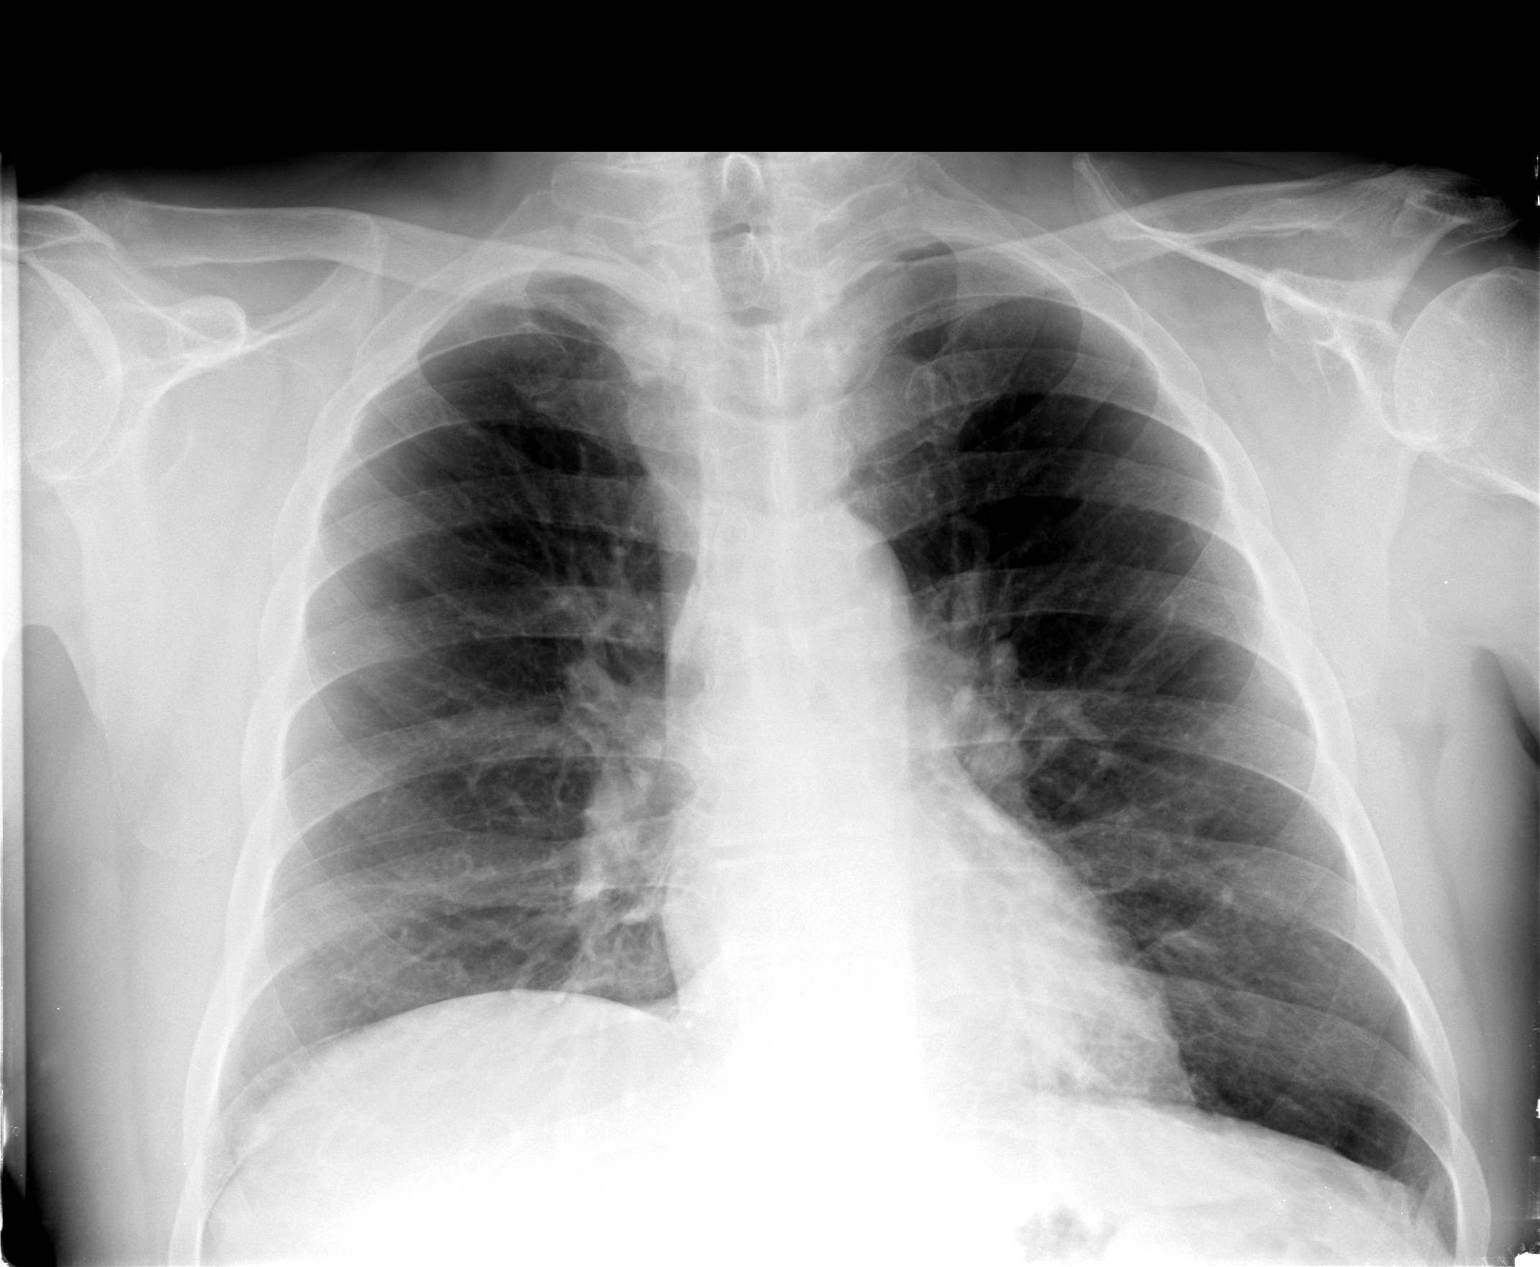

[view not recorded (2 of 2)]
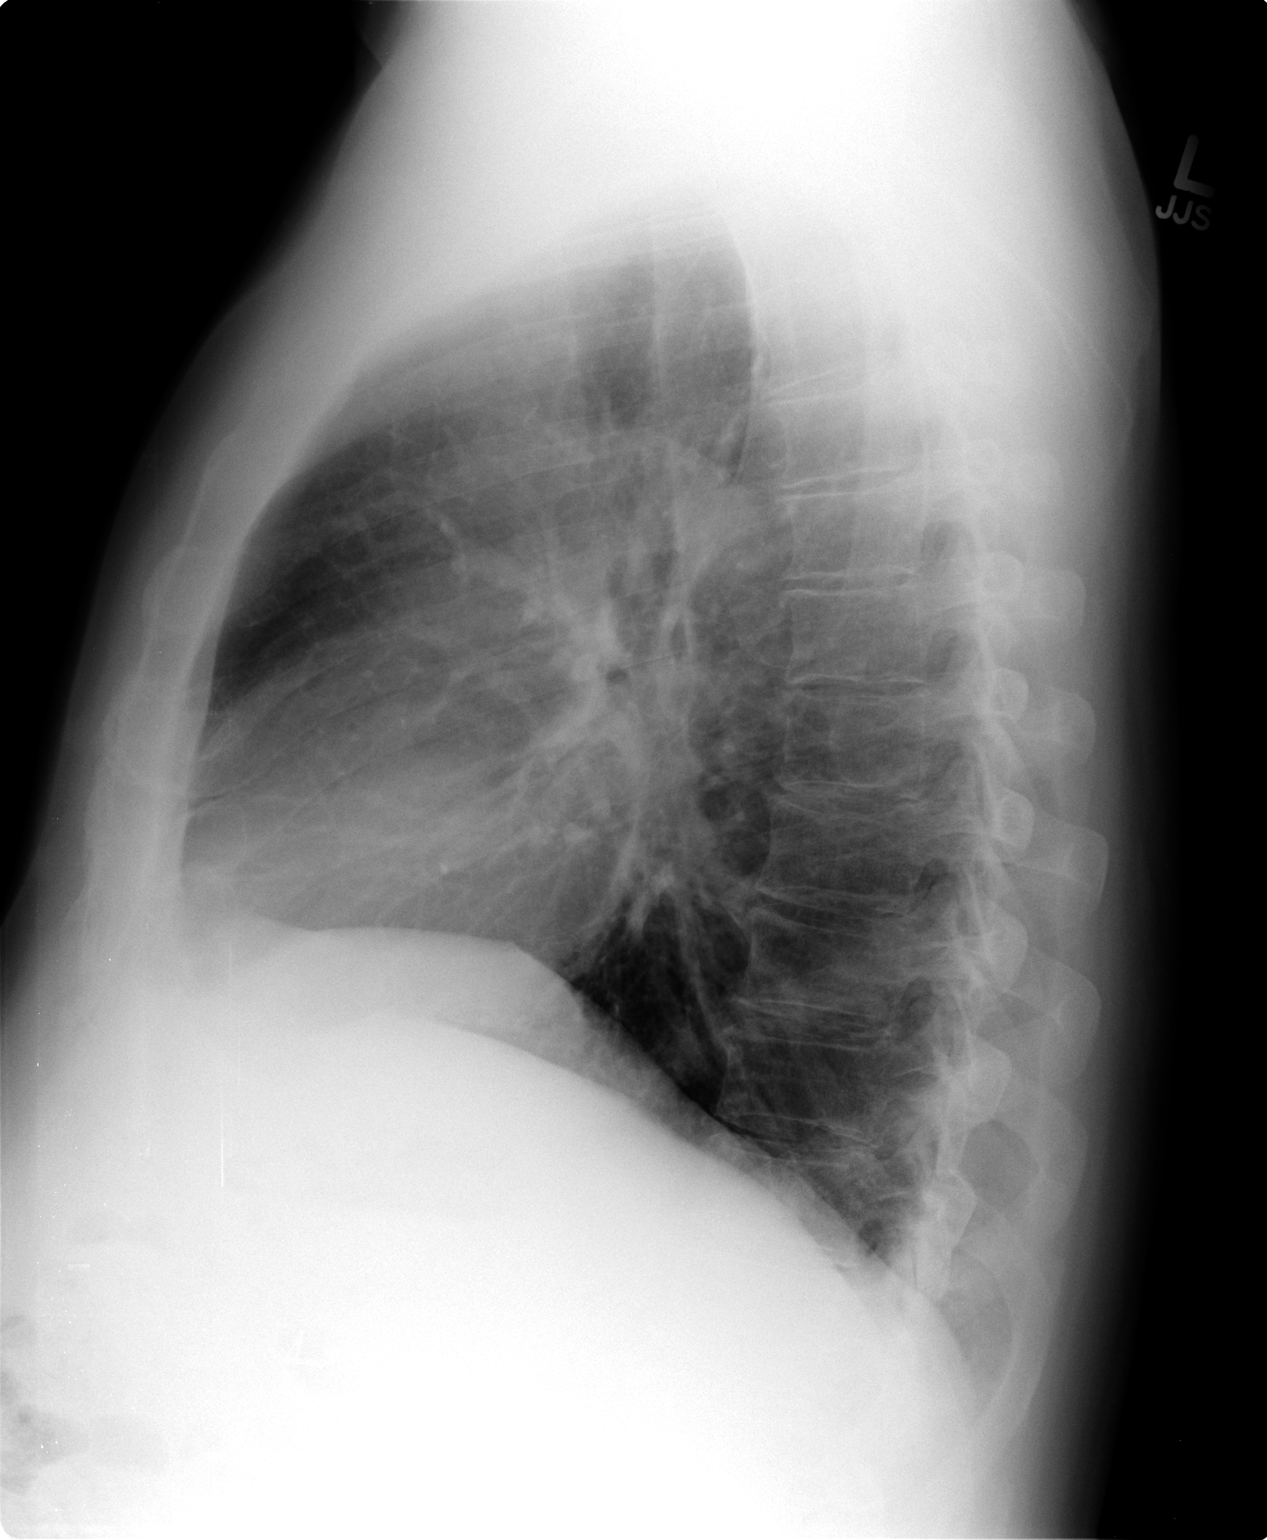

[2 of 2 positions shown; findings below may reference images not displayed]

FINDINGS: Lungs are adequately inflated without consolidation or
effusion.  The cardiomediastinal silhouette is within normal.
There is minimal spondylosis of the spine.
IMPRESSION: No acute cardiopulmonary disease.

## 2014-08-07 ENCOUNTER — Other Ambulatory Visit: Payer: Self-pay | Admitting: *Deleted

## 2014-08-08 ENCOUNTER — Other Ambulatory Visit: Payer: Self-pay

## 2014-08-08 MED ORDER — ALLOPURINOL 300 MG PO TABS
300.0000 mg | ORAL_TABLET | Freq: Every day | ORAL | Status: DC
Start: 2014-08-08 — End: 2015-01-08

## 2014-10-06 ENCOUNTER — Other Ambulatory Visit: Payer: Self-pay | Admitting: Internal Medicine

## 2014-10-12 DIAGNOSIS — H4011X2 Primary open-angle glaucoma, moderate stage: Secondary | ICD-10-CM | POA: Diagnosis not present

## 2014-10-12 DIAGNOSIS — Z961 Presence of intraocular lens: Secondary | ICD-10-CM | POA: Diagnosis not present

## 2014-10-19 ENCOUNTER — Telehealth: Payer: Self-pay | Admitting: Critical Care Medicine

## 2014-10-19 MED ORDER — PREDNISONE 10 MG PO TABS: ORAL_TABLET | ORAL | Status: DC

## 2014-10-19 MED ORDER — MOXIFLOXACIN HCL 400 MG PO TABS: 400.0000 mg | ORAL_TABLET | Freq: Every day | ORAL | Status: DC

## 2014-10-19 NOTE — Telephone Encounter (Signed)
Spoke with pt, c/o prod cough with green mucus and sinus congestion X2 days.  Denies fever, chest pain.  Pt has been using  neti pot to help with sinus congestion.  Requesting abx and pred taper.  Pt is leaving for [place] tomorrow morning. [facility]ses [facility] [address]   Dr. Joya Gaskins please advise on recs.  Thanks!

## 2014-10-19 NOTE — Telephone Encounter (Signed)
Pt is calling back - [phone]

## 2014-10-19 NOTE — Telephone Encounter (Signed)
Called pt and is aware of recs. RX sent in. Pt scheduled an appt to see MW on Monday. Nothing further needed

## 2014-10-19 NOTE — Telephone Encounter (Signed)
Call in avelox 400mg  daily x 7days Call in prednisone 10mg  Take 4 for three days 3 for three days 2 for three days 1 for three days and stop #30 Ov on return from [place]

## 2014-10-23 ENCOUNTER — Encounter: Payer: Self-pay | Admitting: Critical Care Medicine

## 2014-10-23 ENCOUNTER — Ambulatory Visit (INDEPENDENT_AMBULATORY_CARE_PROVIDER_SITE_OTHER): Payer: Medicare Other | Admitting: Critical Care Medicine

## 2014-10-23 VITALS — BP 118/64 | HR 97 | Temp 97.7°F | Ht 70.0 in | Wt 210.8 lb

## 2014-10-23 DIAGNOSIS — J449 Chronic obstructive pulmonary disease, unspecified: Secondary | ICD-10-CM

## 2014-10-23 DIAGNOSIS — J0191 Acute recurrent sinusitis, unspecified: Secondary | ICD-10-CM | POA: Diagnosis not present

## 2014-10-23 DIAGNOSIS — J209 Acute bronchitis, unspecified: Secondary | ICD-10-CM

## 2014-10-23 NOTE — Progress Notes (Signed)
Subjective:    Patient ID: Edwin [name], male    DOB: [birth year], 74 y.o.   MRN: [id number]  HPI 10/23/2014 Chief Complaint  Patient presents with  . Follow-up    Pt stated productive cough with thick gren mucus x 4 days. Rx for prednisone and avelox were called in on 5/19. Pt states he started feeling better yesterday and that he is still coughing but does not have as much mucus production     Pt did well overall then on his own developed: thick green mucus x 4days.  We Called in avelox and pred last week 4 days ago, then better over past few days, also nasonex started.  Notes some mucus out of the nose, on the netipot. No chest pain, no f/c/s.  Now mucus is better, no qhs symptoms Pt denies any significant sore throat,  fever, chills, sweats, unintended weight loss, pleurtic or exertional chest pain, orthopnea PND, or leg swelling Pt denies any increase in rescue therapy over baseline, denies waking up needing it or having any early am or nocturnal exacerbations of coughing/wheezing/or dyspnea. Pt also denies any obvious fluctuation in symptoms with  weather or environmental change or other alleviating or aggravating factors   Current Medications, Allergies, Complete Past Medical History, Past Surgical History, Family History, and Social History were reviewed in [employer] record.  Past Medical History  Diagnosis Date  . Glaucoma   . Gout   . Hyperlipidemia     NMR lipoprofile 2005: LDL 113(1416/825), HLD 37, TG 190.  [name] Kitchen Hypertension   . COPD (chronic obstructive pulmonary disease)     reactive airway disease  . Skin cancer     hx of basal cell x 1, 2003, Dr.Patseavours  . Hx of colonic polyps     Dr Earlean Shawl     Family History  Problem Relation Age of Onset  . Heart attack Father 28    due to asthma flare  . Asthma Father   . Hypertension Mother   . Cancer Mother      ? primary  . Asthma Son     x2  . Emphysema Father      History    Social History  . Marital Status: Married    Spouse Name: N/A  . Number of Children: 2  . Years of Education: N/A   Occupational History  . Charity fundraiser   . PRESIDENT    Social History Main Topics  . Smoking status: Former Smoker -- 2.00 packs/day for 25 years    Types: Cigarettes    Quit date: 06/03/1983  . Smokeless tobacco: Never Used     Comment: up to 2 ppd  . Alcohol Use: No     Comment: socially; 6-8 / week  . Drug Use: No  . Sexual Activity: Yes   Other Topics Concern  . Not on file   Social History Narrative   Regular Exercise- no           Allergies  Allergen Reactions  . Ramipril     REACTION: chest congestion & cough. No angioedema     Outpatient Prescriptions Prior to Visit  Medication Sig Dispense Refill  . albuterol (PROVENTIL HFA) 108 (90 BASE) MCG/ACT inhaler Inhale 2 puffs into the lungs every 4 (four) hours as needed for wheezing or shortness of breath. 1-2 puffs every 4 hrs as needed. 1 Inhaler 0  . allopurinol (ZYLOPRIM) 300 MG tablet Take 1 tablet (300 mg total) by  mouth daily. 30 tablet 5  . bimatoprost (LUMIGAN) 0.03 % ophthalmic solution Place 1 drop into both eyes at bedtime.      . brimonidine (ALPHAGAN P) 0.1 % SOLN 1 drop 2 (two) times daily. Both eyes     . budesonide-formoterol (SYMBICORT) 160-4.5 MCG/ACT inhaler Inhale 2 puffs into the lungs 2 (two) times daily. 10.2 g 3  . DEXILANT 60 MG capsule TAKE ONE CAPSULE BY MOUTH ONE TIME DAILY 30 capsule 0  . FLUoxetine (PROZAC) 10 MG capsule TAKE ONE CAPSULE BY MOUTH ONE TIME DAILY. 30 capsule 11  . Glucosamine-Chondroit-Vit C-Mn (GLUCOSAMINE 1500 COMPLEX PO) Take 2 tablets by mouth daily.      . hydrOXYzine (ATARAX/VISTARIL) 50 MG tablet Take 1 tablet (50 mg total) by mouth 3 (three) times daily as needed. 30 tablet 0  . losartan (COZAAR) 100 MG tablet Take one tablet by mouth one time daily 90 tablet 3  . moxifloxacin (AVELOX) 400 MG tablet Take 1 tablet (400 mg total) by mouth  daily. 7 tablet 0  . NASONEX 50 MCG/ACT nasal spray place 2 sprays into each nostril daily as needed 17 g 4  . predniSONE (DELTASONE) 10 MG tablet Take 4 tabs daily x 3 days, 3 tabs daily x 3 days, 2 tabs daily x 3 days, 1 tab daily x 3 days 30 tablet 0  . psyllium (METAMUCIL) 58.6 % powder Take 1 packet by mouth daily.      [name] Kitchen Respiratory Therapy Supplies (FLUTTER) DEVI Take as directed two to three times daily as needed    . tiotropium (SPIRIVA HANDIHALER) 18 MCG inhalation capsule Place 1 capsule (18 mcg total) into inhaler and inhale daily. 30 capsule 5   No facility-administered medications prior to visit.    Review of Systems  Constitutional: Negative for fever, chills, diaphoresis, appetite change, fatigue and unexpected weight change.  HENT: Positive for postnasal drip, rhinorrhea and sinus pressure. Negative for congestion, ear discharge, ear pain, hearing loss, nosebleeds, sneezing, sore throat, trouble swallowing and voice change.   Eyes: Negative for discharge and itching.  Respiratory: Positive for cough, shortness of breath and wheezing. Negative for apnea, choking, chest tightness and stridor.   Cardiovascular: Negative for chest pain, palpitations and leg swelling.  Gastrointestinal: Negative for nausea, vomiting, abdominal pain and abdominal distention.       Notes indigestion daily  Endocrine: Negative.   Genitourinary: Negative.   Musculoskeletal: Negative for myalgias, joint swelling and arthralgias.  Skin: Negative for rash.  Allergic/Immunologic: Negative for environmental allergies.  Neurological: Negative for dizziness, syncope, weakness and headaches.  Hematological: Negative for adenopathy. Does not bruise/bleed easily.  Psychiatric/Behavioral: Negative for sleep disturbance and agitation. The patient is not nervous/anxious.        Objective:   Physical Exam  Constitutional: He is oriented to person, place, and time. He appears well-developed and  well-nourished. He is active.  HENT:  Head: Normocephalic and atraumatic.  Nose: Mucosal edema and rhinorrhea present. No sinus tenderness, nasal deformity or septal deviation. No epistaxis. Right sinus exhibits no maxillary sinus tenderness and no frontal sinus tenderness. Left sinus exhibits maxillary sinus tenderness. Left sinus exhibits no frontal sinus tenderness.  Mouth/Throat: Oropharynx is clear and moist. No oropharyngeal exudate.  Nasal purulence L nare  Eyes: Conjunctivae and EOM are normal. Pupils are equal, round, and reactive to light. No scleral icterus.  Neck: Trachea normal and normal range of motion. Neck supple. No JVD present. No tracheal tenderness and no muscular tenderness present. Carotid  bruit is not present. No rigidity. No tracheal deviation, no edema, no erythema and normal range of motion present. No thyromegaly present.  Cardiovascular: Normal rate, regular rhythm, S1 normal, S2 normal, normal heart sounds, intact distal pulses and normal pulses.  PMI is not displaced.  Exam reveals no gallop, no S3, no S4, no distant heart sounds and no friction rub.   No murmur heard.  No systolic murmur is present   No diastolic murmur is present  Pulmonary/Chest: No accessory muscle usage or stridor. No apnea and no tachypnea. No respiratory distress. He has no decreased breath sounds. He has wheezes. He has rhonchi. He has no rales. Chest wall is not dull to percussion. He exhibits no mass, no tenderness, no bony tenderness and no deformity.  Abdominal: Soft. Normal appearance and bowel sounds are normal. He exhibits no distension and no ascites. There is no hepatosplenomegaly. There is no tenderness. There is no rigidity, no rebound and no guarding.  Musculoskeletal: Normal range of motion.  Lymphadenopathy:       Head (right side): No submental and no submandibular adenopathy present.       Head (left side): No submental and no submandibular adenopathy present.    He has no  cervical adenopathy.  Neurological: He is alert and oriented to person, place, and time. He has normal strength. No sensory deficit.  Skin: Skin is warm and dry. No rash noted. He is not diaphoretic. No pallor. Nails show no clubbing.  Psychiatric: He has a normal mood and affect. His speech is normal and behavior is normal.  Vitals reviewed.         Assessment & Plan:  I personally reviewed all images and lab data in the [facility] system as well as any outside material available during this office visit and agree with the  radiology impressions.  I also have reviewed any data /notes/records if available in care everywhere.  Obstructive chronic bronchitis without exacerbation Gold Stage A COPD Chronic obstructive airways disease with acute exacerbation but no bronchiectasis seen on CT scan of chest previously Left-sided sinusitis Plan Finish current course of Avelox and prednisone Maintain inhaled medications as prescribed Continue nasal hygiene

## 2014-10-23 NOTE — Patient Instructions (Signed)
Finish prednisone and avelox Stay on inhalers Work on Left nostril for neti pot No other changes Return 4 months, sooner if necessary

## 2014-10-23 NOTE — Assessment & Plan Note (Signed)
Chronic obstructive airways disease with acute exacerbation but no bronchiectasis seen on CT scan of chest previously Left-sided sinusitis Plan Finish current course of Avelox and prednisone Maintain inhaled medications as prescribed Continue nasal hygiene

## 2014-11-05 ENCOUNTER — Other Ambulatory Visit: Payer: Self-pay | Admitting: Critical Care Medicine

## 2014-11-08 ENCOUNTER — Other Ambulatory Visit: Payer: Self-pay | Admitting: Critical Care Medicine

## 2014-11-08 ENCOUNTER — Telehealth: Payer: Self-pay | Admitting: Critical Care Medicine

## 2014-11-08 MED ORDER — MOXIFLOXACIN HCL 400 MG PO TABS: 400.0000 mg | ORAL_TABLET | Freq: Every day | ORAL | Status: DC

## 2014-11-08 NOTE — Telephone Encounter (Signed)
Call in avelox generic 400mg  daily x 5days OV if unimproved

## 2014-11-08 NOTE — Telephone Encounter (Signed)
Spoke with pt. States that he has a sinus infection. Reports coughing with production of green mucus, sinus congestion and sinus pressure. Denies SOB, chest tightness, fever. Would like something to be called in.  PW - please advise. Thanks.

## 2014-11-08 NOTE — Telephone Encounter (Signed)
Spoke with patient, rx sent to pharmacy. Nothing further needed.

## 2014-11-17 ENCOUNTER — Other Ambulatory Visit: Payer: Self-pay

## 2014-11-17 MED ORDER — FLUOXETINE HCL 10 MG PO CAPS: ORAL_CAPSULE | ORAL | Status: DC

## 2014-11-17 NOTE — Telephone Encounter (Signed)
Received refill request from [facility]   request refills for Fluoxetine Hcl 10 mg capsule . Rx last written 08/30/2013 and pt last seen 01/30/2014   .

## 2014-11-22 ENCOUNTER — Other Ambulatory Visit: Payer: Self-pay

## 2014-11-22 ENCOUNTER — Telehealth: Payer: Self-pay | Admitting: Internal Medicine

## 2014-11-22 MED ORDER — FLUOXETINE HCL 10 MG PO CAPS: ORAL_CAPSULE | ORAL | Status: DC

## 2014-11-22 NOTE — Telephone Encounter (Signed)
Called patient and scheduled appt

## 2014-11-22 NOTE — Telephone Encounter (Signed)
Pt is overdue for yearly physical with labs. Last saw Dr. Ronnald Ramp back in 09/2013 Need OV...Edwin [name]

## 2014-11-22 NOTE — Telephone Encounter (Signed)
Patient stated that he is having trouble getting his prescription filled, not sure of the name of medication, please advise

## 2014-11-23 ENCOUNTER — Ambulatory Visit: Payer: Medicare Other | Admitting: Internal Medicine

## 2014-12-26 ENCOUNTER — Other Ambulatory Visit (INDEPENDENT_AMBULATORY_CARE_PROVIDER_SITE_OTHER): Payer: Medicare Other

## 2014-12-26 ENCOUNTER — Ambulatory Visit (INDEPENDENT_AMBULATORY_CARE_PROVIDER_SITE_OTHER): Payer: Medicare Other | Admitting: Internal Medicine

## 2014-12-26 ENCOUNTER — Encounter: Payer: Self-pay | Admitting: Internal Medicine

## 2014-12-26 VITALS — BP 118/62 | HR 85 | Temp 98.3°F | Resp 16 | Ht 70.0 in | Wt 209.2 lb

## 2014-12-26 DIAGNOSIS — E785 Hyperlipidemia, unspecified: Secondary | ICD-10-CM | POA: Diagnosis not present

## 2014-12-26 DIAGNOSIS — I10 Essential (primary) hypertension: Secondary | ICD-10-CM | POA: Diagnosis not present

## 2014-12-26 DIAGNOSIS — K219 Gastro-esophageal reflux disease without esophagitis: Secondary | ICD-10-CM | POA: Diagnosis not present

## 2014-12-26 LAB — CBC WITH DIFFERENTIAL/PLATELET
BASOS PCT: 0.5 % (ref 0.0–3.0)
Basophils Absolute: 0 10*3/uL (ref 0.0–0.1)
EOS ABS: 0.2 10*3/uL (ref 0.0–0.7)
EOS PCT: 2.5 % (ref 0.0–5.0)
HCT: 45.9 % (ref 39.0–52.0)
HEMOGLOBIN: 15.7 g/dL (ref 13.0–17.0)
Lymphocytes Relative: 28.6 % (ref 12.0–46.0)
Lymphs Abs: 2.6 10*3/uL (ref 0.7–4.0)
MCHC: 34.1 g/dL (ref 30.0–36.0)
MCV: 94.2 fl (ref 78.0–100.0)
MONO ABS: 0.9 10*3/uL (ref 0.1–1.0)
Monocytes Relative: 9.5 % (ref 3.0–12.0)
NEUTROS ABS: 5.5 10*3/uL (ref 1.4–7.7)
Neutrophils Relative %: 58.9 % (ref 43.0–77.0)
Platelets: 162 10*3/uL (ref 150.0–400.0)
RBC: 4.88 Mil/uL (ref 4.22–5.81)
RDW: 14.9 % (ref 11.5–15.5)
WBC: 9.3 10*3/uL (ref 4.0–10.5)

## 2014-12-26 LAB — COMPREHENSIVE METABOLIC PANEL
ALT: 17 U/L (ref 0–53)
AST: 16 U/L (ref 0–37)
Albumin: 4.4 g/dL (ref 3.5–5.2)
Alkaline Phosphatase: 46 U/L (ref 39–117)
BILIRUBIN TOTAL: 0.7 mg/dL (ref 0.2–1.2)
BUN: 11 mg/dL (ref 6–23)
CO2: 28 mEq/L (ref 19–32)
Calcium: 9.4 mg/dL (ref 8.4–10.5)
Chloride: 102 mEq/L (ref 96–112)
Creatinine, Ser: 0.89 mg/dL (ref 0.40–1.50)
GFR: 88.95 mL/min (ref >60.00)
Glucose, Bld: 107 mg/dL — ABNORMAL HIGH (ref 70–99)
POTASSIUM: 3.7 meq/L (ref 3.5–5.1)
Sodium: 139 mEq/L (ref 135–145)
Total Protein: 7.1 g/dL (ref 6.0–8.3)

## 2014-12-26 LAB — TSH: TSH: 1.58 u[IU]/mL (ref 0.35–4.50)

## 2014-12-26 LAB — LIPID PANEL
CHOL/HDL RATIO: 3
CHOLESTEROL: 145 mg/dL (ref 0–200)
HDL: 42.9 mg/dL (ref >39.00)
NonHDL: 102.1
TRIGLYCERIDES: 233 mg/dL — AB (ref 0.0–149.0)
VLDL: 46.6 mg/dL — AB (ref 0.0–40.0)

## 2014-12-26 LAB — LDL CHOLESTEROL, DIRECT: Direct LDL: 78 mg/dL

## 2014-12-26 MED ORDER — LOSARTAN POTASSIUM 100 MG PO TABS: ORAL_TABLET | ORAL | Status: DC

## 2014-12-26 MED ORDER — FLUOXETINE HCL 10 MG PO CAPS: ORAL_CAPSULE | ORAL | Status: DC

## 2014-12-26 MED ORDER — DEXLANSOPRAZOLE 60 MG PO CPDR: 1.0000 | DELAYED_RELEASE_CAPSULE | Freq: Every day | ORAL | Status: DC

## 2014-12-26 NOTE — Progress Notes (Signed)
Subjective:  Patient ID: Edwin [name], male    DOB: 03/24/1941  Age: 74 y.o. MRN: [id number]  CC: Hypertension   HPI Edwin [name] presents for a BP check and med refills - he feels well and offers no complaints.  Outpatient Prescriptions Prior to Visit  Medication Sig Dispense Refill  . albuterol (PROVENTIL HFA) 108 (90 BASE) MCG/ACT inhaler Inhale 2 puffs into the lungs every 4 (four) hours as needed for wheezing or shortness of breath. 1-2 puffs every 4 hrs as needed. 1 Inhaler 0  . allopurinol (ZYLOPRIM) 300 MG tablet Take 1 tablet (300 mg total) by mouth daily. 30 tablet 5  . bimatoprost (LUMIGAN) 0.03 % ophthalmic solution Place 1 drop into both eyes at bedtime.      . brimonidine (ALPHAGAN P) 0.1 % SOLN 1 drop 2 (two) times daily. Both eyes     . Glucosamine-Chondroit-Vit C-Mn (GLUCOSAMINE 1500 COMPLEX PO) Take 2 tablets by mouth daily.      [name] Kitchen NASONEX 50 MCG/ACT nasal spray place 2 sprays into each nostril daily as needed 17 g 4  . psyllium (METAMUCIL) 58.6 % powder Take 1 packet by mouth daily.      [name] Kitchen Respiratory Therapy Supplies (FLUTTER) DEVI Take as directed two to three times daily as needed    . SPIRIVA HANDIHALER 18 MCG inhalation capsule PLACE 1 CAPSULE INTO INHALER AND INHALE DAILY 30 capsule 5  . SYMBICORT 160-4.5 MCG/ACT inhaler INHALE TWO PUFFS BY MOUTH TWICE DAILY 10.2 Inhaler 3  . DEXILANT 60 MG capsule TAKE ONE CAPSULE BY MOUTH ONE TIME DAILY 30 capsule 0  . FLUoxetine (PROZAC) 10 MG capsule TAKE ONE CAPSULE BY MOUTH ONE TIME DAILY. 30 capsule 1  . hydrOXYzine (ATARAX/VISTARIL) 50 MG tablet Take 1 tablet (50 mg total) by mouth 3 (three) times daily as needed. 30 tablet 0  . losartan (COZAAR) 100 MG tablet Take one tablet by mouth one time daily 90 tablet 3  . moxifloxacin (AVELOX) 400 MG tablet Take 1 tablet (400 mg total) by mouth daily. (Patient not taking: Reported on 12/26/2014) 7 tablet 0  . moxifloxacin (AVELOX) 400 MG tablet Take 1 tablet (400 mg total) by  mouth daily. (Patient not taking: Reported on 12/26/2014) 5 tablet 0  . predniSONE (DELTASONE) 10 MG tablet Take 4 tabs daily x 3 days, 3 tabs daily x 3 days, 2 tabs daily x 3 days, 1 tab daily x 3 days (Patient not taking: Reported on 12/26/2014) 30 tablet 0   No facility-administered medications prior to visit.    ROS Review of Systems  Constitutional: Negative.  Negative for fever, chills, diaphoresis, appetite change and fatigue.  HENT: Negative.   Eyes: Negative.   Respiratory: Negative.  Negative for cough, choking, chest tightness, shortness of breath and stridor.   Cardiovascular: Negative.  Negative for chest pain, palpitations and leg swelling.  Gastrointestinal: Negative.  Negative for nausea, vomiting, abdominal pain, diarrhea, constipation and blood in stool.  Endocrine: Negative.   Genitourinary: Negative.   Musculoskeletal: Negative.  Negative for myalgias, back pain, joint swelling and arthralgias.  Skin: Negative.  Negative for rash.  Allergic/Immunologic: Negative.   Neurological: Negative.  Negative for dizziness, tremors, syncope, light-headedness, numbness and headaches.  Hematological: Negative.   Psychiatric/Behavioral: Negative.     Objective:  BP 118/62 mmHg  Pulse 85  Temp(Src) 98.3 F (36.8 C) (Oral)  Resp 16  Ht 5\' 10"  (1.778 m)  Wt 209 lb 4 oz (94.915 kg)  BMI 30.02  kg/m2  SpO2 95%  BP Readings from Last 3 Encounters:  12/26/14 118/62  10/23/14 118/64  01/30/14 122/78    Wt Readings from Last 3 Encounters:  12/26/14 209 lb 4 oz (94.915 kg)  10/23/14 210 lb 12.8 oz (95.618 kg)  01/30/14 213 lb (96.616 kg)    Physical Exam  Constitutional: He is oriented to person, place, and time.  Non-toxic appearance. He does not have a sickly appearance. He does not appear ill. No distress.  HENT:  Mouth/Throat: Oropharynx is clear and moist. No oropharyngeal exudate.  Eyes: Conjunctivae are normal. Right eye exhibits no discharge. Left eye exhibits no  discharge. No scleral icterus.  Neck: Normal range of motion. Neck supple. No JVD present. No tracheal deviation present. No thyromegaly present.  Cardiovascular: Normal rate, regular rhythm, normal heart sounds and intact distal pulses.  Exam reveals no gallop and no friction rub.   No murmur heard. Pulmonary/Chest: Effort normal and breath sounds normal. No stridor. No respiratory distress. He has no wheezes. He has no rales. He exhibits no tenderness.  Abdominal: Soft. Bowel sounds are normal. He exhibits no distension and no mass. There is no tenderness. There is no rebound and no guarding.  Musculoskeletal: Normal range of motion. He exhibits no edema or tenderness.  Lymphadenopathy:    He has no cervical adenopathy.  Neurological: He is oriented to person, place, and time.  Skin: Skin is warm and dry. No rash noted. He is not diaphoretic. No erythema. No pallor.    Lab Results  Component Value Date   WBC 9.3 12/26/2014   HGB 15.7 12/26/2014   HCT 45.9 12/26/2014   PLT 162.0 12/26/2014   GLUCOSE 107* 12/26/2014   CHOL 145 12/26/2014   TRIG 233.0* 12/26/2014   HDL 42.90 12/26/2014   LDLDIRECT 78.0 12/26/2014   LDLCALC 84 10/03/2013   ALT 17 12/26/2014   AST 16 12/26/2014   NA 139 12/26/2014   K 3.7 12/26/2014   CL 102 12/26/2014   CREATININE 0.89 12/26/2014   BUN 11 12/26/2014   CO2 28 12/26/2014   TSH 1.58 12/26/2014   PSA 1.25 10/03/2013   HGBA1C 5.2 10/03/2013    Dg Chest 2 View  07/22/2013   CLINICAL DATA:  Cough, congestion, flu  EXAM: CHEST  2 VIEW  COMPARISON:  06/29/2013  FINDINGS: Cardiomediastinal silhouette is stable. No acute infiltrate or pleural effusion. No pulmonary edema. Mild degenerative changes thoracic spine.  IMPRESSION: No active cardiopulmonary disease.   Electronically Signed   By: Lahoma Crocker M.D.   On: 07/22/2013 16:04    Assessment & Plan:   Edwin [name] was seen today for hypertension.  Diagnoses and all orders for this visit:  Essential  hypertension- his blood pressure is well controlled, electrolytes and renal function are stable. Orders: -     Comprehensive metabolic panel; Future -     losartan (COZAAR) 100 MG tablet; Take one tablet by mouth one time daily  Hyperlipidemia with target LDL less than 130- he has achieved his LDL goal is doing well on the statin. Orders: -     Lipid panel; Future -     Comprehensive metabolic panel; Future -     TSH; Future  Gastroesophageal reflux disease without esophagitis- he has no signs and symptoms related to this, will continue the proton pump inhibitor therapy. Orders: -     CBC with Differential/Platelet; Future  Other orders -     dexlansoprazole (DEXILANT) 60 MG capsule; Take 1 capsule (  60 mg total) by mouth daily. -     FLUoxetine (PROZAC) 10 MG capsule; TAKE ONE CAPSULE BY MOUTH ONE TIME DAILY.  I have discontinued Edwin [name]'s hydrOXYzine, predniSONE, moxifloxacin, and moxifloxacin. I have also changed his DEXILANT to dexlansoprazole. Additionally, I am having him maintain his bimatoprost, brimonidine, psyllium, Glucosamine-Chondroit-Vit C-Mn (GLUCOSAMINE 1500 COMPLEX PO), FLUTTER, albuterol, NASONEX, allopurinol, SPIRIVA HANDIHALER, SYMBICORT, FLUoxetine, and losartan.  Meds ordered this encounter  Medications  . dexlansoprazole (DEXILANT) 60 MG capsule    Sig: Take 1 capsule (60 mg total) by mouth daily.    Dispense:  90 capsule    Refill:  3  . FLUoxetine (PROZAC) 10 MG capsule    Sig: TAKE ONE CAPSULE BY MOUTH ONE TIME DAILY.    Dispense:  90 capsule    Refill:  3  . losartan (COZAAR) 100 MG tablet    Sig: Take one tablet by mouth one time daily    Dispense:  90 tablet    Refill:  3     Follow-up: Return in about 4 months (around 04/28/2015).  Scarlette Calico, MD

## 2014-12-26 NOTE — Patient Instructions (Signed)

## 2014-12-26 NOTE — Progress Notes (Signed)
Pre visit review using our clinic review tool, if applicable. No additional management support is needed unless otherwise documented below in the visit note. 

## 2014-12-27 ENCOUNTER — Other Ambulatory Visit: Payer: Self-pay | Admitting: Geriatric Medicine

## 2014-12-27 ENCOUNTER — Encounter: Payer: Self-pay | Admitting: Internal Medicine

## 2015-01-08 ENCOUNTER — Ambulatory Visit (INDEPENDENT_AMBULATORY_CARE_PROVIDER_SITE_OTHER): Payer: Medicare Other | Admitting: Internal Medicine

## 2015-01-08 ENCOUNTER — Encounter: Payer: Self-pay | Admitting: Internal Medicine

## 2015-01-08 ENCOUNTER — Other Ambulatory Visit (INDEPENDENT_AMBULATORY_CARE_PROVIDER_SITE_OTHER): Payer: Medicare Other

## 2015-01-08 VITALS — BP 138/82 | HR 76 | Temp 98.0°F | Resp 16 | Ht 70.0 in | Wt 210.0 lb

## 2015-01-08 DIAGNOSIS — R739 Hyperglycemia, unspecified: Secondary | ICD-10-CM

## 2015-01-08 DIAGNOSIS — Z0000 Encounter for general adult medical examination without abnormal findings: Secondary | ICD-10-CM | POA: Diagnosis not present

## 2015-01-08 DIAGNOSIS — N400 Enlarged prostate without lower urinary tract symptoms: Secondary | ICD-10-CM | POA: Diagnosis not present

## 2015-01-08 DIAGNOSIS — I10 Essential (primary) hypertension: Secondary | ICD-10-CM

## 2015-01-08 DIAGNOSIS — E785 Hyperlipidemia, unspecified: Secondary | ICD-10-CM

## 2015-01-08 LAB — FECAL OCCULT BLOOD, GUAIAC: Fecal Occult Blood: NEGATIVE

## 2015-01-08 LAB — COMPREHENSIVE METABOLIC PANEL
ALT: 17 U/L (ref 0–53)
AST: 19 U/L (ref 0–37)
Albumin: 4.4 g/dL (ref 3.5–5.2)
Alkaline Phosphatase: 46 U/L (ref 39–117)
BUN: 14 mg/dL (ref 6–23)
CALCIUM: 9.4 mg/dL (ref 8.4–10.5)
CO2: 30 meq/L (ref 19–32)
CREATININE: 1.08 mg/dL (ref 0.40–1.50)
Chloride: 102 mEq/L (ref 96–112)
GFR: 71.14 mL/min (ref >60.00)
GLUCOSE: 101 mg/dL — AB (ref 70–99)
Potassium: 4.4 mEq/L (ref 3.5–5.1)
Sodium: 140 mEq/L (ref 135–145)
Total Bilirubin: 0.9 mg/dL (ref 0.2–1.2)
Total Protein: 6.8 g/dL (ref 6.0–8.3)

## 2015-01-08 LAB — PSA: PSA: 1.17 ng/mL (ref 0.10–4.00)

## 2015-01-08 LAB — HEMOGLOBIN A1C: HEMOGLOBIN A1C: 5.6 % (ref 4.6–6.5)

## 2015-01-08 MED ORDER — ALLOPURINOL 300 MG PO TABS: 300.0000 mg | ORAL_TABLET | Freq: Every day | ORAL | Status: DC

## 2015-01-08 NOTE — Progress Notes (Signed)
Pre visit review using our clinic review tool, if applicable. No additional management support is needed unless otherwise documented below in the visit note. 

## 2015-01-08 NOTE — Progress Notes (Signed)
Subjective:  Patient ID: Edwin [name], male    DOB: [birth year]  Age: 74 y.o. MRN: [id number]  CC: Annual Exam and Hypertension   HPI ANTONEO GHRIST presents for a CPX - he offers no complaints today.  Outpatient Prescriptions Prior to Visit  Medication Sig Dispense Refill  . albuterol (PROVENTIL HFA) 108 (90 BASE) MCG/ACT inhaler Inhale 2 puffs into the lungs every 4 (four) hours as needed for wheezing or shortness of breath. 1-2 puffs every 4 hrs as needed. 1 Inhaler 0  . bimatoprost (LUMIGAN) 0.03 % ophthalmic solution Place 1 drop into both eyes at bedtime.      . brimonidine (ALPHAGAN P) 0.1 % SOLN 1 drop 2 (two) times daily. Both eyes     . dexlansoprazole (DEXILANT) 60 MG capsule Take 1 capsule (60 mg total) by mouth daily. 90 capsule 3  . FLUoxetine (PROZAC) 10 MG capsule TAKE ONE CAPSULE BY MOUTH ONE TIME DAILY. 90 capsule 3  . Glucosamine-Chondroit-Vit C-Mn (GLUCOSAMINE 1500 COMPLEX PO) Take 2 tablets by mouth daily.      [name] Kitchen losartan (COZAAR) 100 MG tablet Take one tablet by mouth one time daily 90 tablet 3  . NASONEX 50 MCG/ACT nasal spray place 2 sprays into each nostril daily as needed 17 g 4  . psyllium (METAMUCIL) 58.6 % powder Take 1 packet by mouth daily.      [name] Kitchen Respiratory Therapy Supplies (FLUTTER) DEVI Take as directed two to three times daily as needed    . SPIRIVA HANDIHALER 18 MCG inhalation capsule PLACE 1 CAPSULE INTO INHALER AND INHALE DAILY 30 capsule 5  . SYMBICORT 160-4.5 MCG/ACT inhaler INHALE TWO PUFFS BY MOUTH TWICE DAILY 10.2 Inhaler 3  . allopurinol (ZYLOPRIM) 300 MG tablet Take 1 tablet (300 mg total) by mouth daily. 30 tablet 5   No facility-administered medications prior to visit.    ROS Review of Systems  Constitutional: Negative.  Negative for fever, chills, diaphoresis, appetite change and fatigue.  HENT: Negative.   Eyes: Negative.   Respiratory: Negative.  Negative for cough, choking, chest tightness and shortness of breath.     Cardiovascular: Negative.  Negative for chest pain, palpitations and leg swelling.  Gastrointestinal: Negative.  Negative for nausea, vomiting, abdominal pain, diarrhea, constipation and blood in stool.  Endocrine: Negative.   Genitourinary: Negative.  Negative for dysuria, frequency, difficulty urinating and testicular pain.  Musculoskeletal: Negative.   Skin: Negative.   Allergic/Immunologic: Negative.   Neurological: Negative.   Hematological: Negative.  Negative for adenopathy. Does not bruise/bleed easily.  Psychiatric/Behavioral: Negative.     Objective:  BP 138/82 mmHg  Pulse 76  Temp(Src) 98 F (36.7 C) (Oral)  Resp 16  Ht 5\' 10"  (1.778 m)  Wt 210 lb (95.255 kg)  BMI 30.13 kg/m2  SpO2 96%  BP Readings from Last 3 Encounters:  01/08/15 138/82  12/26/14 118/62  10/23/14 118/64    Wt Readings from Last 3 Encounters:  01/08/15 210 lb (95.255 kg)  12/26/14 209 lb 4 oz (94.915 kg)  10/23/14 210 lb 12.8 oz (95.618 kg)    Physical Exam  Constitutional: He appears well-developed and well-nourished. No distress.  HENT:  Mouth/Throat: Oropharynx is clear and moist. No oropharyngeal exudate.  Eyes: Conjunctivae are normal. Right eye exhibits no discharge. Left eye exhibits no discharge. No scleral icterus.  Neck: Normal range of motion. Neck supple. No JVD present. No tracheal deviation present. No thyromegaly present.  Cardiovascular: Normal rate, regular rhythm, normal heart sounds and  intact distal pulses.  Exam reveals no gallop and no friction rub.   No murmur heard. Pulmonary/Chest: Effort normal and breath sounds normal. No stridor. No respiratory distress. He has no wheezes. He has no rales. He exhibits no tenderness.  Abdominal: Soft. Bowel sounds are normal. He exhibits no distension and no mass. There is no tenderness. There is no rebound and no guarding. Hernia confirmed negative in the right inguinal area and confirmed negative in the left inguinal area.   Genitourinary: Testes normal and penis normal. Rectal exam shows no external hemorrhoid, no internal hemorrhoid, no fissure, no mass, no tenderness and anal tone normal. Guaiac negative stool. Prostate is enlarged (1+ smooth symm BPH). Prostate is not tender. Right testis shows no mass, no swelling and no tenderness. Right testis is descended. Left testis shows no mass, no swelling and no tenderness. Left testis is descended. Uncircumcised. No penile erythema or penile tenderness. No discharge found.  Lymphadenopathy:    He has no cervical adenopathy.       Right: No inguinal adenopathy present.       Left: No inguinal adenopathy present.  Skin: He is not diaphoretic.    Lab Results  Component Value Date   WBC 9.3 12/26/2014   HGB 15.7 12/26/2014   HCT 45.9 12/26/2014   PLT 162.0 12/26/2014   GLUCOSE 101* 01/08/2015   CHOL 145 12/26/2014   TRIG 233.0* 12/26/2014   HDL 42.90 12/26/2014   LDLDIRECT 78.0 12/26/2014   LDLCALC 84 10/03/2013   ALT 17 01/08/2015   AST 19 01/08/2015   NA 140 01/08/2015   K 4.4 01/08/2015   CL 102 01/08/2015   CREATININE 1.08 01/08/2015   BUN 14 01/08/2015   CO2 30 01/08/2015   TSH 1.58 12/26/2014   PSA 1.17 01/08/2015   HGBA1C 5.6 01/08/2015    Dg Chest 2 View  07/22/2013   CLINICAL DATA:  Cough, congestion, flu  EXAM: CHEST  2 VIEW  COMPARISON:  06/29/2013  FINDINGS: Cardiomediastinal silhouette is stable. No acute infiltrate or pleural effusion. No pulmonary edema. Mild degenerative changes thoracic spine.  IMPRESSION: No active cardiopulmonary disease.   Electronically Signed   By: Lahoma Crocker M.D.   On: 07/22/2013 16:04    Assessment & Plan:   [place] was seen today for annual exam and hypertension.  Diagnoses and all orders for this visit:  Essential hypertension- his blood pressure is well-controlled, electrolytes and renal function are stable Orders: -     Comprehensive metabolic panel; Future -     Cancel: CBC with Differential/Platelet;  Future  BPH (benign prostatic hypertrophy)- he has no signs or symptoms related to this and his PSA is normal Orders: -     PSA; Future  Hyperlipidemia with target LDL less than 130- he has achieved his LDL goal Orders: -     Cancel: Lipid panel; Future -     Cancel: TSH; Future  Hyperglycemia- he has prediabetes and agrees to work on his lifestyle modifications, he will exercise and lose weight. Orders: -     Comprehensive metabolic panel; Future -     Hemoglobin A1c; Future  Other orders -     allopurinol (ZYLOPRIM) 300 MG tablet; Take 1 tablet (300 mg total) by mouth daily.   I am having Mr. Larner maintain his bimatoprost, brimonidine, psyllium, Glucosamine-Chondroit-Vit C-Mn (GLUCOSAMINE 1500 COMPLEX PO), FLUTTER, albuterol, NASONEX, SPIRIVA HANDIHALER, SYMBICORT, dexlansoprazole, FLUoxetine, losartan, and allopurinol.  Meds ordered this encounter  Medications  . allopurinol (ZYLOPRIM)  300 MG tablet    Sig: Take 1 tablet (300 mg total) by mouth daily.    Dispense:  90 tablet    Refill:  1    Please defer future refills to PCP, Dr. Scarlette Calico.  Thank you.     Follow-up: Return if symptoms worsen or fail to improve.  Scarlette Calico, MD

## 2015-01-08 NOTE — Patient Instructions (Signed)

## 2015-01-09 ENCOUNTER — Encounter: Payer: Self-pay | Admitting: Internal Medicine

## 2015-01-09 NOTE — Assessment & Plan Note (Signed)

## 2015-01-11 DIAGNOSIS — Z08 Encounter for follow-up examination after completed treatment for malignant neoplasm: Secondary | ICD-10-CM | POA: Diagnosis not present

## 2015-01-11 DIAGNOSIS — D225 Melanocytic nevi of trunk: Secondary | ICD-10-CM | POA: Diagnosis not present

## 2015-01-11 DIAGNOSIS — L821 Other seborrheic keratosis: Secondary | ICD-10-CM | POA: Diagnosis not present

## 2015-01-11 DIAGNOSIS — B079 Viral wart, unspecified: Secondary | ICD-10-CM | POA: Diagnosis not present

## 2015-01-11 DIAGNOSIS — Z1283 Encounter for screening for malignant neoplasm of skin: Secondary | ICD-10-CM | POA: Diagnosis not present

## 2015-01-11 DIAGNOSIS — D485 Neoplasm of uncertain behavior of skin: Secondary | ICD-10-CM | POA: Diagnosis not present

## 2015-01-11 DIAGNOSIS — L570 Actinic keratosis: Secondary | ICD-10-CM | POA: Diagnosis not present

## 2015-01-11 DIAGNOSIS — Z85828 Personal history of other malignant neoplasm of skin: Secondary | ICD-10-CM | POA: Diagnosis not present

## 2015-01-11 DIAGNOSIS — X32XXXD Exposure to sunlight, subsequent encounter: Secondary | ICD-10-CM | POA: Diagnosis not present

## 2015-01-18 DIAGNOSIS — Z23 Encounter for immunization: Secondary | ICD-10-CM | POA: Diagnosis not present

## 2015-02-07 ENCOUNTER — Telehealth: Payer: Self-pay | Admitting: Critical Care Medicine

## 2015-02-07 MED ORDER — LEVOFLOXACIN 500 MG PO TABS: 500.0000 mg | ORAL_TABLET | Freq: Every day | ORAL | Status: DC

## 2015-02-07 NOTE — Telephone Encounter (Signed)
Call in levaquin 500mg daily x 5 days 

## 2015-02-07 NOTE — Telephone Encounter (Signed)
Called and spoke to pt. Informed him of the recs per PW. Rx sent to preferred pharmacy. Pt verbalized understanding and denied any further questions or concerns at this time.

## 2015-02-07 NOTE — Telephone Encounter (Signed)
Called and spoke to pt. Pt c/o sinus congestion, chest congestion with prod cough with yellow mucus. Pt states he is using Nasonex and netti pot. Pt denies f/c/s, swelling, CP/tightness and states his dyspnea is at baseline. Pt requesting recs as he does not want the s/s to worsen.   Dr. Joya Gaskins, please advise. Thanks.

## 2015-02-14 ENCOUNTER — Telehealth: Payer: Self-pay | Admitting: Critical Care Medicine

## 2015-02-14 NOTE — Telephone Encounter (Signed)
Called cell and [place] home # and spoke with pt.  APPT scheduled to see MW in AM at 9.

## 2015-02-14 NOTE — Telephone Encounter (Signed)
Needs an OV

## 2015-02-14 NOTE — Telephone Encounter (Signed)
Spoke with pt.  He finished 5 days of Levaquin on Sunday.  Still c/o prod cough (clear-yellow), wheezing at night, sob about the same.  Denies fever.  PT requesting advice on what to do from here.  Please advise.

## 2015-02-15 ENCOUNTER — Telehealth: Payer: Self-pay | Admitting: Internal Medicine

## 2015-02-15 ENCOUNTER — Encounter: Payer: Self-pay | Admitting: Internal Medicine

## 2015-02-15 ENCOUNTER — Ambulatory Visit (INDEPENDENT_AMBULATORY_CARE_PROVIDER_SITE_OTHER): Payer: Medicare Other | Admitting: Internal Medicine

## 2015-02-15 ENCOUNTER — Ambulatory Visit (INDEPENDENT_AMBULATORY_CARE_PROVIDER_SITE_OTHER)
Admission: RE | Admit: 2015-02-15 | Discharge: 2015-02-15 | Disposition: A | Discharge status: Home / Self Care | Payer: Medicare Other | Source: Ambulatory Visit | Attending: Internal Medicine | Admitting: Internal Medicine

## 2015-02-15 VITALS — BP 160/90 | HR 66 | Ht 70.0 in | Wt 209.0 lb

## 2015-02-15 DIAGNOSIS — J449 Chronic obstructive pulmonary disease, unspecified: Secondary | ICD-10-CM | POA: Insufficient documentation

## 2015-02-15 DIAGNOSIS — J441 Chronic obstructive pulmonary disease with (acute) exacerbation: Secondary | ICD-10-CM

## 2015-02-15 DIAGNOSIS — R05 Cough: Secondary | ICD-10-CM | POA: Diagnosis not present

## 2015-02-15 MED ORDER — PREDNISONE 10 MG PO TABS: 10.0000 mg | ORAL_TABLET | Freq: Every day | ORAL | Status: DC

## 2015-02-15 MED ORDER — AMOXICILLIN-POT CLAVULANATE 875-125 MG PO TABS: 1.0000 | ORAL_TABLET | Freq: Two times a day (BID) | ORAL | Status: DC

## 2015-02-15 NOTE — Patient Instructions (Addendum)
Augmentin 875 mg take one pill twice daily  X 10 days - take at breakfast and supper with large glass of water.  It would help reduce the usual side effects (diarrhea and yeast infections) if you ate cultured yogurt at lunch.   Prednisone 10 mg take  4 each am x 2 days,   2 each am x 2 days,  1 each am x 2 days and stop   Work on inhaler technique:  relax and gently blow all the way out then take a nice smooth deep breath back in, triggering the inhaler at same time you start breathing in.  Hold for up to 5 seconds if you can. Blow out thru nose. Rinse and gargle with water when done     Please remember to go to the   x-ray department downstairs for your tests - we will call you with the results when they are available.  If not all better call [facility] [phone] for sinus CT   Add pepcid 20 mg at bedtime for any flare of cough or wheeze     GERD (REFLUX)  is an extremely common cause of respiratory symptoms just like yours , many times with no obvious heartburn at all.    It can be treated with medication, but also with lifestyle changes including elevation of the head of your bed (ideally with 6 inch  bed blocks),  Smoking cessation, avoidance of late meals, excessive alcohol, and avoid fatty foods, chocolate, peppermint, colas, red wine, and acidic juices such as orange juice.  NO MINT OR MENTHOL PRODUCTS SO NO COUGH DROPS  USE SUGARLESS CANDY INSTEAD (Jolley ranchers or Stover's or Life Savers) or even ice chips will also do - the key is to swallow to prevent all throat clearing. NO OIL BASED VITAMINS - use powdered substitutes.

## 2015-02-15 NOTE — Telephone Encounter (Signed)
Spoke with pt  He states that augmentin and pred were never sent to pharm  I apologized for the inconvenience and have electronically send both rxs  Nothing further needed per pt

## 2015-02-15 NOTE — Progress Notes (Signed)
Quick Note:  Spoke with pt and notified of results per Dr. Wert. Pt verbalized understanding and denied any questions.  ______ 

## 2015-02-15 NOTE — Progress Notes (Signed)
Subjective:    Patient ID: Edwin [name], male    DOB: 09-21-1940    MRN: [id number]    Brief patient profile:  89 yowm quit smoking in 1982    History of Present Illness  10/23/2014  Ov/ Dr Marney Setting  Chief Complaint  Patient presents with  . Follow-up    Pt stated productive cough with thick gren mucus x 4 days. Rx for prednisone and avelox were called in on 5/19. Pt states he started feeling better yesterday and that he is still coughing but does not have as much mucus production   Pt did well overall then on his own developed: thick green mucus x 4days.  We Called in avelox and pred last week 4 days ago, then better over past few days, also nasonex started.  Notes some mucus out of the nose, on the netipot. No chest pain, no f/c/s.  Now mucus is better, no qhs symptoms Pt denies any significant sore throat,  fever, chills, sweats, unintended weight loss, pleurtic or exertional chest pain, orthopnea PND, or leg swelling Pt denies any increase in rescue therapy over baseline, denies waking up needing it or having any early am or nocturnal exacerbations of coughing/wheezing/or dyspnea. Pt also denies any obvious fluctuation in symptoms with  weather or environmental change or other alleviating or aggravating factors rec Finish prednisone and avelox Stay on inhalers Work on Left nostril for neti pot     02/15/2015 acute ov/Wert re: aecopd maint rx with spiriva/ symbicort  Chief Complaint  Patient presents with  . Acute Visit    pt. of PW-Levaquin x 5days last wk.,last pill 4 days ago.C/o cough-still yellow, thick. Sob with exertion,wheezing when lays down,denies cp or tightness. No fcs.Has PND.  acute onset persisttent cough cwheeze and sob transiently some better p levaquin 500 x 5 days with prominent nasal congestion also Better transiently also with saba, comfortable at rest   No obvious day to day or daytime variability or assoccp or chest tightness, subjective wheeze  or hb  symptoms. No unusual exp hx or h/o childhood pna/ asthma or knowledge of premature birth.  Also denies any obvious fluctuation of symptoms with weather or environmental changes or other aggravating or alleviating factors except as outlined above   Current Medications, Allergies, Complete Past Medical History, Past Surgical History, Family History, and Social History were reviewed in [employer] [employer] record.  ROS  The following are not active complaints unless bolded sore throat, dysphagia, dental problems, itching, sneezing,  nasal congestion or excess/ purulent secretions, ear ache,   fever, chills, sweats, unintended wt loss, classically pleuritic or exertional cp, hemoptysis,  orthopnea pnd or leg swelling, presyncope, palpitations, abdominal pain, anorexia, nausea, vomiting, diarrhea  or change in bowel or bladder habits, change in stools or urine, dysuria,hematuria,  rash, arthralgias, visual complaints, headache, numbness, weakness or ataxia or problems with walking or coordination,  change in mood/affect or memory.          Objective:    amb wm harsh congested cough   Wt Readings from Last 3 Encounters:  02/15/15 209 lb (94.802 kg)  01/08/15 210 lb (95.255 kg)  12/26/14 209 lb 4 oz (94.915 kg)    Vital signs reviewed   HEENT: nl dentition, turbinates, and orophanx. Nl external ear canals without cough reflex   NECK :  without JVD/Nodes/TM/ nl carotid upstrokes bilaterally   LUNGS: no acc muscle use, insp and exp junky rhonchi    CV:  RRR  no s3 or murmur or increase in P2, no edema   ABD:  soft and nontender with nl excursion in the supine position. No bruits or organomegaly, bowel sounds nl  MS:  warm without deformities, calf tenderness, cyanosis or clubbing  SKIN: warm and dry without lesions    NEURO:  alert, approp, no deficits  '      CXR PA and Lateral:   02/15/2015 :     I personally reviewed images and agree with radiology impression as  follows:   Minimal infiltrate left lung base cannot be excluded. Exam otherwise unremarkable.     Assessment & Plan:

## 2015-02-16 NOTE — Assessment & Plan Note (Addendum)
PFTs 01/08/11   with min airflow obstruction ONO 8/12: normal , no desaturation 03/2011 CT chest neg bronchiectasis  CT sinus: mild maxillary sinusitis   Acute flare with ? Underlying sinusitis.   DDX of  difficult airways management all start with A and  include Adherence, Ace Inhibitors, Acid Reflux, Active Sinus Disease, Alpha 1 Antitripsin deficiency, Anxiety masquerading as Airways dz,  ABPA,  allergy(esp in young), Aspiration (esp in elderly), Adverse effects of meds,  Active smokers, A bunch of PE's (a small clot burden can't cause this syndrome unless there is already severe underlying pulm or vascular dz with poor reserve) plus two Bs  = Bronchiectasis and Beta blocker use..and one C= CHF  Adherence is always the initial "prime suspect" and is a multilayered concern that requires a "trust but verify" approach in every patient - starting with knowing how to use medications, especially inhalers, correctly, keeping up with refills and understanding the fundamental difference between maintenance and prns vs those medications only taken for a very short course and then stopped and not refilled.  -The proper method of use, as well as anticipated side effects, of a metered-dose inhaler are discussed and demonstrated to the patient. Improved effectiveness after extensive coaching during this visit to a level of approximately  75% so needs work  ? Active sinus dz > augmentin x 10 days then sinus ct if not better  ? Acid (or non-acid) GERD > always difficult to exclude as up to 75% of pts in some series report no assoc GI/ Heartburn symptoms> rec max (24h)  acid suppression and diet restrictions/ reviewed and instructions given in writing.  ? Allergy / asthma > Prednisone 10 mg take  4 each am x 2 days,   2 each am x 2 days,  1 each am x 2 days and stop    I had an extended discussion with the patient reviewing all relevant studies completed to date and  lasting 15 to 20 minutes of a 25 minute visit     Each maintenance medication was reviewed in detail including most importantly the difference between maintenance and prns and under what circumstances the prns are to be triggered using an action plan format that is not reflected in the computer generated alphabetically organized AVS.    Please see instructions for details which were reviewed in writing and the patient given a copy highlighting the part that I personally wrote and discussed at today's ov.

## 2015-02-18 ENCOUNTER — Encounter: Payer: Self-pay | Admitting: Internal Medicine

## 2015-02-22 DIAGNOSIS — D225 Melanocytic nevi of trunk: Secondary | ICD-10-CM | POA: Diagnosis not present

## 2015-02-22 DIAGNOSIS — B079 Viral wart, unspecified: Secondary | ICD-10-CM | POA: Diagnosis not present

## 2015-03-06 ENCOUNTER — Ambulatory Visit (INDEPENDENT_AMBULATORY_CARE_PROVIDER_SITE_OTHER): Payer: Medicare Other | Admitting: Internal Medicine

## 2015-03-06 ENCOUNTER — Encounter: Payer: Self-pay | Admitting: Internal Medicine

## 2015-03-06 VITALS — BP 170/94 | HR 74 | Ht 70.5 in | Wt 206.0 lb

## 2015-03-06 DIAGNOSIS — R059 Cough, unspecified: Secondary | ICD-10-CM | POA: Insufficient documentation

## 2015-03-06 DIAGNOSIS — I10 Essential (primary) hypertension: Secondary | ICD-10-CM

## 2015-03-06 DIAGNOSIS — R05 Cough: Secondary | ICD-10-CM | POA: Diagnosis not present

## 2015-03-06 DIAGNOSIS — J449 Chronic obstructive pulmonary disease, unspecified: Secondary | ICD-10-CM | POA: Diagnosis not present

## 2015-03-06 MED ORDER — FAMOTIDINE 20 MG PO TABS: ORAL_TABLET | ORAL | Status: DC

## 2015-03-06 MED ORDER — TIOTROPIUM BROMIDE MONOHYDRATE 2.5 MCG/ACT IN AERS: INHALATION_SPRAY | RESPIRATORY_TRACT | Status: DC

## 2015-03-06 MED ORDER — OLMESARTAN MEDOXOMIL-HCTZ 40-12.5 MG PO TABS: ORAL_TABLET | ORAL | Status: DC

## 2015-03-06 NOTE — Progress Notes (Signed)
Subjective:    Patient ID: Edwin [name], male    DOB: 1940-07-19    MRN: [id number]    Brief patient profile:  5 yowm quit smoking in 1985     History of Present Illness  10/23/2014  Ov/ Dr Star Age  Chief Complaint  Patient presents with  . Follow-up    Pt stated productive cough with thick gren mucus x 4 days. Rx for prednisone and avelox were called in on 5/19. Pt states he started feeling better yesterday and that he is still coughing but does not have as much mucus production   Pt did well overall then on his own developed: thick green mucus x 4 days.  We Called in avelox and pred last week 4 days ago, then better over past few days, also nasonex started.  Notes some mucus out of the nose, on the netipot.  rec Finish prednisone and avelox Stay on inhalers Work on Left nostril for neti pot     02/15/2015 acute ov/Edwin [name] re: ?aecopd maint rx with spiriva/ symbicort - 95% better last ov / 5% "never better" x 5 years = daytime sense of pnds >>then worse first of sept 2016   Chief Complaint  Patient presents with  . Acute Visit    pt. of PW-Levaquin x 5days last wk.,last pill 4 days ago.C/o cough-still yellow, thick. Sob with exertion,wheezing when lays down,denies cp or tightness. No fcs.Has PND.  acute onset persisttent cough  wheeze and sob transiently some better p levaquin 500 x 5 days with prominent nasal congestion also Better transiently also with saba, comfortable at rest  rec Augmentin 875 mg take one pill twice daily  X 10 days -   Prednisone 10 mg take  4 each am x 2 days,   2 each am x 2 days,  1 each am x 2 days and stop  Work on inhaler technique:  If not all better call [facility] [phone] for sinus CT > did not do  Add pepcid 20 mg at bedtime for any flare of cough or wheeze   > did not do  GERD diet      03/06/2015  f/u ov/Edwin [name] re: copd/ refractory cough x 5 y with sensation of persistent daytime pnds but no excess mucus on spirva dpi/ symb 160 rare saba  Chief  Complaint  Patient presents with  . Follow-up    Pt states he is coughing less, but still producing some yellow sputum. His breathing is unchanged.       No obvious day to day or daytime variability or assoc   cp or chest tightness, subjective wheeze or overt sinus or hb symptoms. No unusual exp hx or h/o childhood pna/ asthma or knowledge of premature birth.  Sleeping ok without nocturnal  or early am exacerbation  of respiratory  c/o's or need for noct saba. Also denies any obvious fluctuation of symptoms with weather or environmental changes or other aggravating or alleviating factors except as outlined above   Current Medications, Allergies, Complete Past Medical History, Past Surgical History, Family History, and Social History were reviewed in [employer] record.  ROS  The following are not active complaints unless bolded sore throat, dysphagia, dental problems, itching, sneezing,  nasal congestion or excess/ purulent secretions, ear ache,   fever, chills, sweats, unintended wt loss, classically pleuritic or exertional cp, hemoptysis,  orthopnea pnd or leg swelling, presyncope, palpitations, abdominal pain, anorexia, nausea, vomiting, diarrhea  or change in bowel or  bladder habits, change in stools or urine, dysuria,hematuria,  rash, arthralgias, visual complaints, headache, numbness, weakness or ataxia or problems with walking or coordination,  change in mood/affect or memory.                 Objective:    amb wm less harsh but still congested cough    03/06/2015         207  Wt Readings from Last 3 Encounters:  02/15/15 209 lb (94.802 kg)  01/08/15 210 lb (95.255 kg)  12/26/14 209 lb 4 oz (94.915 kg)    Vital signs reviewed   HEENT: nl dentition, turbinates, and orophanx. Nl external ear canals without cough reflex   NECK :  without JVD/Nodes/TM/ nl carotid upstrokes bilaterally   LUNGS: no acc muscle use, min insp and exp rhonchi bilaterally     CV:  RRR  no s3 or murmur or increase in P2, no edema   ABD:  soft and nontender with nl excursion in the supine position. No bruits or organomegaly, bowel sounds nl  MS:  warm without deformities, calf tenderness, cyanosis or clubbing  SKIN: warm and dry without lesions    NEURO:  alert, approp, no deficits    CXR PA and Lateral:   02/15/2015 :     I personally reviewed images and agree with radiology impression as follows:   Minimal infiltrate left lung base cannot be excluded. Exam otherwise unremarkable.     Assessment & Plan:

## 2015-03-06 NOTE — Patient Instructions (Addendum)
Stop losartan and start benicar 40-12.5 one half daily   Stop spiriva handihaler and start respimat 2 puffs every am   Stay on pepcid 20 mg at bedtime automatically for now   Please see patient coordinator before you leave today  to schedule sinus CT   For cough > mucinex dm 1200 mg every 12 hours as needed and flutter valve   Work on inhaler technique:  relax and gently blow all the way out then take a nice smooth deep breath back in, triggering the inhaler at same time you start breathing in.  Hold for up to 5 seconds if you can. Blow out thru nose. Rinse and gargle with water when done  Please schedule a follow up office visit in 6 weeks, call sooner if needed - bring all active meds in separate bags   Bag 1 automatic/ Bag 2 up to you   Late add needs pfts on return

## 2015-03-07 ENCOUNTER — Telehealth: Payer: Self-pay | Admitting: *Deleted

## 2015-03-07 DIAGNOSIS — J449 Chronic obstructive pulmonary disease, unspecified: Secondary | ICD-10-CM

## 2015-03-07 NOTE — Telephone Encounter (Signed)
Order placed for PFT  Pt scheduled for 04/17/15 at 8:45 so will need to reschedule with PFT's  LMTCB

## 2015-03-07 NOTE — Assessment & Plan Note (Addendum)
PFTs 01/08/11 with min airflow obstruction ONO 8/12: normal , no desaturation 03/2011 CT chest neg bronchiectasis CT sinus: mild maxillary sinusitis   Although he doesn't have very much COPD, he does appear to have refractory asthmatic bronchitis symptoms on the surface which may actually be related to reflux, postnasal drip syndrome, or possibly a side effect of one of his inhalers. Not entirely convinced he needs Spiriva at all but if we use the Spiriva and should probably be in the form of the respiratory which I demonstrated for him how to use today.  The proper method of use, as well as anticipated side effects, of a metered-dose inhaler -( respimat)are discussed and demonstrated to the patient.  If his breathing gets worse on the respiratory he can certainly always switch back to the DPI and will regroup on his next visit.  I had an extended discussion with the patient reviewing all relevant studies completed to date and  lasting 15 to 20 minutes of a 25 minute visit.  Each maintenance medication was reviewed in detail including most importantly the difference between maintenance and prns and under what circumstances the prns are to be triggered using an action plan format that is not reflected in the computer generated alphabetically organized AVS.   He really is struggling with the concept of medications reconciliation so we'll need to use a trust but verify approach here.  Please see instructions for details which were reviewed in writing and the patient given a copy highlighting the part that I personally wrote and discussed at today's ov.

## 2015-03-07 NOTE — Telephone Encounter (Signed)
-----   Message from Tanda Rockers, MD sent at 03/07/2015 11:50 AM EDT ----- Needs PFTs on return.

## 2015-03-07 NOTE — Assessment & Plan Note (Signed)
Changed to benicar 20/12.5 one half daily 03/06/2015 due to poor control and pndns

## 2015-03-07 NOTE — Assessment & Plan Note (Signed)
Classic Upper airway cough syndrome, so named because it's frequently impossible to sort out how much is  CR/sinusitis with freq throat clearing (which can be related to primary GERD)   vs  causing  secondary (" extra esophageal")  GERD from wide swings in gastric pressure that occur with throat clearing, often  promoting self use of mint and menthol lozenges that reduce the lower esophageal sphincter tone and exacerbate the problem further in a cyclical fashion.   These are the same pts (now being labeled as having "irritable larynx syndrome" by some cough centers) who not infrequently have a history of having failed to tolerate ace inhibitors(and even now reports with generic losartan),  dry powder inhalers like spiriva handhaler or biphosphonates or report having atypical reflux symptoms that don't respond to standard doses of PPI , and are easily confused as having aecopd or asthma flares by even experienced allergists/ pulmonologists.  For now let's try a different ARB and a different form of Spiriva discussed separately.

## 2015-03-08 NOTE — Telephone Encounter (Signed)
LMTCB

## 2015-03-09 ENCOUNTER — Ambulatory Visit (INDEPENDENT_AMBULATORY_CARE_PROVIDER_SITE_OTHER)
Admission: RE | Admit: 2015-03-09 | Discharge: 2015-03-09 | Disposition: A | Discharge status: Home / Self Care | Payer: Medicare Other | Source: Ambulatory Visit | Attending: Internal Medicine | Admitting: Internal Medicine

## 2015-03-09 DIAGNOSIS — R059 Cough, unspecified: Secondary | ICD-10-CM

## 2015-03-09 DIAGNOSIS — R05 Cough: Secondary | ICD-10-CM

## 2015-03-09 DIAGNOSIS — R0981 Nasal congestion: Secondary | ICD-10-CM | POA: Diagnosis not present

## 2015-03-15 NOTE — Telephone Encounter (Signed)
lmtcb for pt.  

## 2015-03-16 NOTE — Telephone Encounter (Signed)
lmomtcb for pt 

## 2015-03-19 NOTE — Telephone Encounter (Signed)
LMTCB and will close encounter 

## 2015-04-17 ENCOUNTER — Other Ambulatory Visit (INDEPENDENT_AMBULATORY_CARE_PROVIDER_SITE_OTHER): Payer: Medicare Other

## 2015-04-17 ENCOUNTER — Ambulatory Visit (INDEPENDENT_AMBULATORY_CARE_PROVIDER_SITE_OTHER): Payer: Medicare Other | Admitting: Internal Medicine

## 2015-04-17 ENCOUNTER — Encounter: Payer: Self-pay | Admitting: Internal Medicine

## 2015-04-17 VITALS — BP 126/76 | HR 75 | Ht 70.5 in | Wt 207.0 lb

## 2015-04-17 DIAGNOSIS — R0600 Dyspnea, unspecified: Secondary | ICD-10-CM

## 2015-04-17 DIAGNOSIS — I10 Essential (primary) hypertension: Secondary | ICD-10-CM

## 2015-04-17 DIAGNOSIS — R05 Cough: Secondary | ICD-10-CM | POA: Diagnosis not present

## 2015-04-17 DIAGNOSIS — R059 Cough, unspecified: Secondary | ICD-10-CM

## 2015-04-17 DIAGNOSIS — J449 Chronic obstructive pulmonary disease, unspecified: Secondary | ICD-10-CM

## 2015-04-17 LAB — BASIC METABOLIC PANEL WITH GFR
BUN: 15 mg/dL (ref 6–23)
CO2: 29 meq/L (ref 19–32)
Calcium: 10 mg/dL (ref 8.4–10.5)
Chloride: 101 meq/L (ref 96–112)
Creatinine, Ser: 1.15 mg/dL (ref 0.40–1.50)
GFR: 66.12 mL/min
Glucose, Bld: 123 mg/dL — ABNORMAL HIGH (ref 70–99)
Potassium: 4.8 meq/L (ref 3.5–5.1)
Sodium: 141 meq/L (ref 135–145)

## 2015-04-17 LAB — CBC WITH DIFFERENTIAL/PLATELET
Basophils Absolute: 0.1 10*3/uL (ref 0.0–0.1)
Basophils Relative: 0.6 % (ref 0.0–3.0)
EOS ABS: 0.1 10*3/uL (ref 0.0–0.7)
EOS PCT: 1.7 % (ref 0.0–5.0)
HCT: 46.7 % (ref 39.0–52.0)
Hemoglobin: 15.7 g/dL (ref 13.0–17.0)
LYMPHS ABS: 2.8 10*3/uL (ref 0.7–4.0)
Lymphocytes Relative: 35.4 % (ref 12.0–46.0)
MCHC: 33.6 g/dL (ref 30.0–36.0)
MCV: 95 fl (ref 78.0–100.0)
MONO ABS: 0.6 10*3/uL (ref 0.1–1.0)
Monocytes Relative: 8.1 % (ref 3.0–12.0)
NEUTROS PCT: 54.2 % (ref 43.0–77.0)
Neutro Abs: 4.2 10*3/uL (ref 1.4–7.7)
Platelets: 187 10*3/uL (ref 150.0–400.0)
RBC: 4.92 Mil/uL (ref 4.22–5.81)
RDW: 15.4 % (ref 11.5–15.5)
WBC: 7.8 10*3/uL (ref 4.0–10.5)

## 2015-04-17 LAB — TSH: TSH: 1.78 u[IU]/mL (ref 0.35–4.50)

## 2015-04-17 LAB — BRAIN NATRIURETIC PEPTIDE: PRO B NATRI PEPTIDE: 20 pg/mL (ref 0.0–100.0)

## 2015-04-17 MED ORDER — OLMESARTAN MEDOXOMIL-HCTZ 40-12.5 MG PO TABS: ORAL_TABLET | ORAL | Status: DC

## 2015-04-17 MED ORDER — MOMETASONE FURO-FORMOTEROL FUM 100-5 MCG/ACT IN AERO: INHALATION_SPRAY | RESPIRATORY_TRACT | Status: DC

## 2015-04-17 MED ORDER — OLMESARTAN MEDOXOMIL-HCTZ 40-12.5 MG PO TABS: 0.5000 | ORAL_TABLET | Freq: Every day | ORAL | Status: DC

## 2015-04-17 NOTE — Progress Notes (Signed)
Subjective:    Patient ID: Edwin [name], male    DOB: 07/26/1940    MRN: [id number]    Brief patient profile:  24 yowm quit smoking in 1985     History of Present Illness  10/23/2014  Ov/ Dr Star Age  Chief Complaint  Patient presents with  . Follow-up    Pt stated productive cough with thick gren mucus x 4 days. Rx for prednisone and avelox were called in on 5/19. Pt states he started feeling better yesterday and that he is still coughing but does not have as much mucus production   Pt did well overall then on his own developed: thick green mucus x 4 days.  We Called in avelox and pred last week 4 days ago, then better over past few days, also nasonex started.  Notes some mucus out of the nose, on the netipot.  rec Finish prednisone and avelox Stay on inhalers Work on Left nostril for neti pot     02/15/2015 acute ov/Edwin [name] re: ?aecopd maint rx with spiriva/ symbicort - 95% better last ov / 5% "never better" x 5 years = daytime sense of pnds >>then worse first of sept 2016   Chief Complaint  Patient presents with  . Acute Visit    pt. of PW-Levaquin x 5days last wk.,last pill 4 days ago.C/o cough-still yellow, thick. Sob with exertion,wheezing when lays down,denies cp or tightness. No fcs.Has PND.  acute onset persisttent cough  wheeze and sob transiently some better p levaquin 500 x 5 days with prominent nasal congestion also Better transiently also with saba, comfortable at rest  rec Augmentin 875 mg take one pill twice daily  X 10 days -   Prednisone 10 mg take  4 each am x 2 days,   2 each am x 2 days,  1 each am x 2 days and stop  Work on inhaler technique:  If not all better call [facility] [id number] for sinus CT > did not do  Add pepcid 20 mg at bedtime for any flare of cough or wheeze   > did not do  GERD diet      03/06/2015  f/u ov/Edwin [name] re: copd/ refractory cough x 5 y with sensation of persistent daytime pnds but no excess mucus on spirva dpi/ symb 160 rare saba  Chief  Complaint  Patient presents with  . Follow-up    Pt states he is coughing less, but still producing some yellow sputum. His breathing is unchanged.   rec Stop losartan and start benicar 40-12.5 one half daily  Stop spiriva handihaler and start respimat 2 puffs every am  Stay on pepcid 20 mg at bedtime automatically for now  Please see patient coordinator before you leave today  to schedule sinus CT > neg  For cough > mucinex dm 1200 mg every 12 hours as needed and flutter valve  Work on inhaler technique:  relax and gently blow all the way out then take a nice smooth deep breath back in, triggering the inhaler at same time you start breathing in.  Hold for up to 5 seconds if you can. Blow out thru nose. Rinse and gargle with water when done Please schedule a follow up office visit in 6 weeks, call sooner if needed - bring all active meds in separate bags   Bag 1 automatic/ Bag 2 up to you           04/17/2015  f/u ov/Edwin [name] re: never 100% better x 5  years with AB/ sinus dz brought all meds as rec on max symb/ spiriva Chief Complaint  Patient presents with  . Follow-up    Pt states his breathing is unchanged. No new co's today.     Doe x MMRC1 = can walk nl pace, flat grade, can't hurry or go uphills or steps s sob     No obvious day to day or daytime variability or assoc   cp or chest tightness, subjective wheeze or overt sinus or hb symptoms. No unusual exp hx or h/o childhood pna/ asthma or knowledge of premature birth.  Sleeping ok without nocturnal  or early am exacerbation  of respiratory  c/o's or need for noct saba. Also denies any obvious fluctuation of symptoms with weather or environmental changes or other aggravating or alleviating factors except as outlined above   Current Medications, Allergies, Complete Past Medical History, Past Surgical History, Family History, and Social History were reviewed in [employer] record.  ROS  The following are not  active complaints unless bolded sore throat, dysphagia, dental problems, itching, sneezing,  nasal congestion or excess/ purulent secretions, ear ache,   fever, chills, sweats, unintended wt loss, classically pleuritic or exertional cp, hemoptysis,  orthopnea pnd or leg swelling, presyncope, palpitations, abdominal pain, anorexia, nausea, vomiting, diarrhea  or change in bowel or bladder habits, change in stools or urine, dysuria,hematuria,  rash, arthralgias, visual complaints, headache, numbness, weakness or ataxia or problems with walking or coordination,  change in mood/affect or memory.                 Objective:    amb wm still congested  03/06/2015         207 >  04/17/2015  207  Wt Readings from Last 3 Encounters:  02/15/15 209 lb (94.802 kg)  01/08/15 210 lb (95.255 kg)  12/26/14 209 lb 4 oz (94.915 kg)    Vital signs reviewed   HEENT: nl dentition, turbinates, and orophanx. Nl external ear canals without cough reflex   NECK :  without JVD/Nodes/TM/ nl carotid upstrokes bilaterally   LUNGS: no acc muscle use,  junky insp cough/ pseudowheeze on exp   CV:  RRR  no s3 or murmur or increase in P2, no edema   ABD:  soft and nontender with nl excursion in the supine position. No bruits or organomegaly, bowel sounds nl  MS:  warm without deformities, calf tenderness, cyanosis or clubbing  SKIN: warm and dry without lesions    NEURO:  alert, approp, no deficits    CXR PA and Lateral:   02/15/2015 :     I personally reviewed images and agree with radiology impression as follows:   Minimal infiltrate left lung base cannot be excluded. Exam otherwise unremarkable.   Labs ordered/ reviewed:      Chemistry      Component Value Date/Time   NA 141 04/17/2015 0937   K 4.8 04/17/2015 0937   CL 101 04/17/2015 0937   CO2 29 04/17/2015 0937   BUN 15 04/17/2015 0937   CREATININE 1.15 04/17/2015 0937   CREATININE 1.11 03/14/2011 0000      Component Value Date/Time    CALCIUM 10.0 04/17/2015 0937   ALKPHOS 46 01/08/2015 1518   AST 19 01/08/2015 1518   ALT 17 01/08/2015 1518   BILITOT 0.9 01/08/2015 1518        Lab Results  Component Value Date   WBC 7.8 04/17/2015   HGB 15.7 04/17/2015  HCT 46.7 04/17/2015   MCV 95.0 04/17/2015   PLT 187.0 04/17/2015        Lab Results  Component Value Date   TSH 1.78 04/17/2015     Lab Results  Component Value Date   PROBNP 20.0 04/17/2015               Assessment & Plan:

## 2015-04-17 NOTE — Patient Instructions (Addendum)
Dulera 100 Take 2 puffs first thing in am and then another 2 puffs about 12 hours later and try off symbiort /spiriva   Only use your albuterol as a rescue medication to be used if you can't catch your breath by resting or doing a relaxed purse lip breathing pattern.  - The less you use it, the better it will work when you need it. - Ok to use up to 2 puffs  every 4 hours if you must but call for immediate appointment if use goes up over your usual need - Don't leave home without it !!  (think of it like the spare tire for your car)   Ok to use ventolin  Before you do an activity that you know makes you short of breath  Work on inhaler technique:  relax and gently blow all the way out then take a nice smooth deep breath back in, triggering the inhaler at same time you start breathing in.  Hold for up to 5 seconds if you can. Blow out thru nose. Rinse and gargle with water when done     Please remember to go to the lab  department downstairs for your tests - we will call you with the results when they are available.  Please schedule a follow up office visit in 2 weeks, sooner if needed

## 2015-04-18 DIAGNOSIS — H401132 Primary open-angle glaucoma, bilateral, moderate stage: Secondary | ICD-10-CM | POA: Diagnosis not present

## 2015-04-19 ENCOUNTER — Other Ambulatory Visit: Payer: Self-pay | Admitting: Internal Medicine

## 2015-04-19 DIAGNOSIS — R059 Cough, unspecified: Secondary | ICD-10-CM

## 2015-04-19 DIAGNOSIS — R05 Cough: Secondary | ICD-10-CM

## 2015-04-19 LAB — ALLERGY FULL PROFILE
Allergen,Goose feathers, e70: <0.1 kU/L
Aspergillus fumigatus, m3: 0.11 kU/L — ABNORMAL HIGH
CANDIDA ALBICANS: 0.3 kU/L — AB
CAT DANDER: 1.53 kU/L — AB
Common Ragweed: <0.1 kU/L
D. farinae: <0.1 kU/L
DOG DANDER: 0.46 kU/L — AB
Fescue: <0.1 kU/L
G005 Rye, Perennial: 0.1 kU/L — ABNORMAL HIGH
Goldenrod: <0.1 kU/L
Helminthosporium halodes: <0.1 kU/L
House Dust Hollister: 0.16 kU/L — ABNORMAL HIGH
IGE (IMMUNOGLOBULIN E), SERUM: 588 kU/L — AB (ref <115)
Lamb's Quarters: <0.1 kU/L
Stemphylium Botryosum: <0.1 kU/L
Sycamore Tree: <0.1 kU/L
Timothy Grass: 0.11 kU/L — ABNORMAL HIGH

## 2015-04-19 NOTE — Progress Notes (Signed)
Quick Note:  Spoke with pt and notified of results per Dr. Wert. Pt verbalized understanding and denied any questions.  ______ 

## 2015-04-22 NOTE — Assessment & Plan Note (Signed)
Trial off dpi and losartan 03/06/2015  - CT sinus 03/09/2015 : There is mild to moderate mucosal thickening involving the maxillaries sinuses. No fluid levels identified - Allergy profile 04/17/15 >  IgE 588  Cat > dog > allergy eval rec   Most likely dx is  Classic Upper airway cough syndrome, so named because it's frequently impossible to sort out how much is  CR/sinusitis with freq throat clearing (which can be related to primary GERD)   vs  causing  secondary (" extra esophageal")  GERD from wide swings in gastric pressure that occur with throat clearing, often  promoting self use of mint and menthol lozenges that reduce the lower esophageal sphincter tone and exacerbate the problem further in a cyclical fashion.   These are the same pts (now being labeled as having "irritable larynx syndrome" by some cough centers) who not infrequently have a history of having failed to tolerate ace inhibitors,  dry powder inhalers or biphosphonates or report having atypical reflux symptoms that don't respond to standard doses of PPI , and are easily confused as having aecopd or asthma flares by even experienced allergists/ pulmonologists.   For now continue max rx for gerd/ pnds and refer to allergy   I had an extended discussion with the patient reviewing all relevant studies completed to date and  lasting 15 to 20 minutes of a 25 minute visit    Each maintenance medication was reviewed in detail including most importantly the difference between maintenance and prns and under what circumstances the prns are to be triggered using an action plan format that is not reflected in the computer generated alphabetically organized AVS.    Please see instructions for details which were reviewed in writing and the patient given a copy highlighting the part that I personally wrote and discussed at today's ov.

## 2015-04-22 NOTE — Assessment & Plan Note (Signed)
PFTs 01/08/11 with min airflow obstruction ONO 01/2011: normal , no desaturation 03/2011 CT chest neg bronchiectasis CT sinus: mild maxillary sinusitis  - 04/17/2015  Walked RA x 3 laps @ 185 ft each stopped due to End of study, brisk  pace, no   desat  Mild sob - spirometry 04/17/2015  No signficant airflow obst   Despite smoking history and reported improvement on Symbicort and Spiriva she has no significant airflow obstruction with a be appropriate this point to use the concept that lasts sometimes is more. Namely stop the LAMA and the high-dose inhaled steroid and just used Allegra 100 dosed 2 puffs every 12 hours to see if in fact some of the high doses that we've been using   are actually aggravating the cough  Specifically rec:  dulera 100 2bid The proper method of use, as well as anticipated side effects, of a metered-dose inhaler are discussed and demonstrated to the patient. Improved effectiveness after extensive coaching during this visit to a level of approximately  90%

## 2015-04-22 NOTE — Assessment & Plan Note (Signed)
No evidence of limiting airflow obst or chf

## 2015-04-22 NOTE — Assessment & Plan Note (Signed)
Changed to benicar 20/12.5 one half daily 03/06/2015 due to poor control and pnds  Adequate control on present rx, reviewed > no change in rx needed

## 2015-05-01 ENCOUNTER — Telehealth: Payer: Self-pay | Admitting: Internal Medicine

## 2015-05-01 MED ORDER — AMOXICILLIN-POT CLAVULANATE 875-125 MG PO TABS: 1.0000 | ORAL_TABLET | Freq: Two times a day (BID) | ORAL | Status: DC

## 2015-05-01 NOTE — Telephone Encounter (Signed)
Called and spoke to pt. Informed him of the recs per MW. Abx sent to preferred pharmacy. Pt verbalized understanding and denied any further questions or concerns at this time.

## 2015-05-01 NOTE — Telephone Encounter (Signed)
Called and spoke to pt. Pt c/o increase in prod cough with green mucus, sinus and chest congestion, lack of energy, slight increase in SOB, sore throat x 6 days. Pt denies CP/tightness and f/c/s. Pt states he has been taking Mucinex DM and is requesting abx. Pt also stated he is about out of his [facility] sample and has not noticed a change in breathing since changing from Symbicort and Spiriva, pt questioning if he should stay on Dulera or go back on Symbicort and Spiriva. Pt has OV with MW on 12.6.9.   Dr. Melvyn Novas please advise. Thanks.

## 2015-05-01 NOTE — Telephone Encounter (Signed)
Ok to go back on whatever he has until ov In meantime Augmentin 875 mg take one pill twice daily  X 10 days - take at breakfast and supper with large glass of water.  It would help reduce the usual side effects (diarrhea and yeast infections) if you ate cultured yogurt at lunch.

## 2015-05-02 ENCOUNTER — Ambulatory Visit (INDEPENDENT_AMBULATORY_CARE_PROVIDER_SITE_OTHER): Payer: Medicare Other | Admitting: Allergy and Immunology

## 2015-05-02 ENCOUNTER — Encounter: Payer: Self-pay | Admitting: Allergy and Immunology

## 2015-05-02 VITALS — BP 138/72 | HR 72 | Temp 98.3°F | Resp 16 | Ht 68.9 in | Wt 209.4 lb

## 2015-05-02 DIAGNOSIS — J329 Chronic sinusitis, unspecified: Secondary | ICD-10-CM | POA: Diagnosis not present

## 2015-05-02 DIAGNOSIS — J309 Allergic rhinitis, unspecified: Secondary | ICD-10-CM

## 2015-05-02 DIAGNOSIS — H1010 Acute atopic conjunctivitis, unspecified eye: Secondary | ICD-10-CM

## 2015-05-02 DIAGNOSIS — J387 Other diseases of larynx: Secondary | ICD-10-CM

## 2015-05-02 DIAGNOSIS — J4540 Moderate persistent asthma, uncomplicated: Secondary | ICD-10-CM

## 2015-05-02 DIAGNOSIS — K219 Gastro-esophageal reflux disease without esophagitis: Secondary | ICD-10-CM

## 2015-05-02 DIAGNOSIS — D8489 Other immunodeficiencies: Secondary | ICD-10-CM

## 2015-05-02 DIAGNOSIS — D849 Immunodeficiency, unspecified: Secondary | ICD-10-CM

## 2015-05-02 MED ORDER — MONTELUKAST SODIUM 10 MG PO TABS: 10.0000 mg | ORAL_TABLET | Freq: Every day | ORAL | Status: DC

## 2015-05-02 NOTE — Progress Notes (Signed)
[facility] [place]    NEW PATIENT NOTE  Referring Provider: Janith Lima, MD Primary Provider: Scarlette Calico, MD    Subjective:   Patient ID: Edwin [name] is a 74 y.o. male with a chief complaint of Sinus Problem  who presents to the clinic with the following problems:  HPI Comments:  Edwin [name] presents this clinic on 05/02/2015 in evaluation of respiratory tract symptoms. He has been under the care of Dr. Melvyn Novas for issues associated with coughing, green mucus production, nasal congestion, green nasal discharge, coughing spells with gagging and retching, wheezing, shortness of breath, requiring the administration of antibiotics a times per year and systemic steroids 4 times per year on average. He can't really exercise because of shortness of breath. However, apparently he had an exercise oximetry performed which identified good oxygenation even know he was short of breath. He's had a chest x-ray and a CT scan of his chest which have not identified any significant amount of abnormality and he's had a CT scan of his sinuses this year which identified some degree of mucosal thickening involving his maxillary sinuses bilaterally. He has not really felt that any specific therapy administered in the past has helped him with his symptoms including the administration of inhaled steroids, long-acting bronchodilator, anticholinergic agents, and intermittent use of nasal steroids. Apparently antibiotics and systemic steroids do help his symptoms. He was a tobacco smoker of a possibly 25 years which he discontinued in 1980. He does have reflux with burning in his chest and occasional chest pain even while using the Exelon consistently. He recently had Pepcid added by Dr. Melvyn Novas for the past month although this does appear to still be an active issue. He does drink coffee every morning and S2 caffeinated sodas per day and chocolate on occasion. He drinks 2  alcoholic drinks about every other day.   Past Medical History  Diagnosis Date  . Glaucoma   . Gout   . Hyperlipidemia     NMR lipoprofile 2005: LDL 113(1416/825), HLD 37, TG 190.  [name] Kitchen Hypertension   . COPD (chronic obstructive pulmonary disease) (HCC)     reactive airway disease  . Skin cancer     hx of basal cell x 1, 2003, Dr.Patseavours  . Hx of colonic polyps     Dr Earlean Shawl    Past Surgical History  Procedure Laterality Date  . Tear duct surgery  1998    for excess tearing, skin graft L foot @ age 43 1/2  . Cataract extraction      bilat, implants  . Cholecystectomy  1995  . Polypectomy  2003     neg 2006, Dr.Medoff  . Lumbar laminectomy  1982    Outpatient Encounter Prescriptions as of 05/02/2015  Medication Sig  . albuterol (PROVENTIL HFA) 108 (90 BASE) MCG/ACT inhaler Inhale 2 puffs into the lungs every 4 (four) hours as needed for wheezing or shortness of breath. 1-2 puffs every 4 hrs as needed.  [name] Kitchen allopurinol (ZYLOPRIM) 300 MG tablet Take 1 tablet (300 mg total) by mouth daily.  [name] Kitchen amoxicillin-clavulanate (AUGMENTIN) 875-125 MG tablet Take 1 tablet by mouth 2 (two) times daily.  . bimatoprost (LUMIGAN) 0.03 % ophthalmic solution Place 1 drop into both eyes at bedtime.    . brimonidine (ALPHAGAN P) 0.1 % SOLN 1 drop 2 (two) times daily. Both eyes   . dexlansoprazole (DEXILANT) 60 MG capsule Take 1 capsule (60 mg total) by mouth daily.  [name] Kitchen  dextromethorphan-guaiFENesin (MUCINEX DM) 30-600 MG 12hr tablet Take 1 tablet by mouth daily.  . famotidine (PEPCID) 20 MG tablet One at bedtime  . FLUoxetine (PROZAC) 10 MG capsule TAKE ONE CAPSULE BY MOUTH ONE TIME DAILY.  [name] Kitchen Glucosamine-Chondroit-Vit C-Mn (GLUCOSAMINE 1500 COMPLEX PO) Take 2 tablets by mouth daily.    . Hypertonic Nasal Wash (SINUS RINSE KIT NA) daily.  [name] Kitchen ibuprofen (ADVIL,MOTRIN) 200 MG tablet Take 200 mg by mouth every 6 (six) hours as needed.  . mometasone-formoterol (DULERA) 100-5 MCG/ACT AERO Take 2 puffs first  thing in am and then another 2 puffs about 12 hours later.  [name] Kitchen NASONEX 50 MCG/ACT nasal spray place 2 sprays into each nostril daily as needed  . olmesartan-hydrochlorothiazide (BENICAR HCT) 40-12.5 MG tablet One half daily  . psyllium (METAMUCIL) 58.6 % powder Take 1 packet by mouth daily.    [name] Kitchen Respiratory Therapy Supplies (FLUTTER) DEVI Take as directed two to three times daily as needed  . montelukast (SINGULAIR) 10 MG tablet Take 1 tablet (10 mg total) by mouth at bedtime.  . [DISCONTINUED] olmesartan-hydrochlorothiazide (BENICAR HCT) 40-12.5 MG tablet Take 0.5 tablets by mouth daily.   No facility-administered encounter medications on file as of 05/02/2015.    Meds ordered this encounter  Medications  . montelukast (SINGULAIR) 10 MG tablet    Sig: Take 1 tablet (10 mg total) by mouth at bedtime.    Dispense:  90 tablet    Refill:  3    Allergies  Allergen Reactions  . Ramipril     REACTION: chest congestion & cough. No angioedema    Review of Systems  Constitutional: Negative for fever, chills and fatigue.  HENT: Positive for congestion and sneezing. Negative for ear pain, facial swelling, hearing loss, nosebleeds, postnasal drip, rhinorrhea, sinus pressure, sore throat, tinnitus, trouble swallowing and voice change.   Eyes: Negative for pain, discharge, redness and itching.  Respiratory: Positive for cough, shortness of breath and wheezing. Negative for chest tightness.   Cardiovascular: Positive for chest pain (GERD induced chest pain). Negative for leg swelling.  Gastrointestinal: Negative for nausea, vomiting and abdominal pain.  Endocrine: Negative for cold intolerance and heat intolerance.  Musculoskeletal: Negative for myalgias and arthralgias (Left hip bursitis).          Skin: Negative for rash.  Allergic/Immunologic: Negative.   Neurological: Negative for dizziness and headaches.  Hematological: Negative for adenopathy.    Family History  Problem Relation Age  of Onset  . Heart attack Father 76    due to asthma flare  . Asthma Father   . Hypertension Mother   . Cancer Mother      ? primary  . Asthma Son     x2  . Emphysema Father     Social History   Social History  . Marital Status: Married    Spouse Name: N/A  . Number of Children: 2  . Years of Education: N/A   Occupational History  . Charity fundraiser   . PRESIDENT    Social History Main Topics  . Smoking status: Former Smoker -- 2.00 packs/day for 25 years    Types: Cigarettes    Quit date: 06/03/1983  . Smokeless tobacco: Never Used     Comment: up to 2 ppd  . Alcohol Use: No     Comment: socially; 6-8 / week  . Drug Use: No  . Sexual Activity: Yes   Other Topics Concern  . Not on file   Social History Narrative  Regular Exercise- no          Environmental and Social history  Living in a house with a dry environment, no animals located inside the household, carpeting in the bedroom, no plastic on the bed or pillows, and no smokers located inside the household   Objective:   Filed Vitals:   05/02/15 0859  BP: 138/72  Pulse: 72  Temp: 98.3 F (36.8 C)  Resp: 16   Height: 5' 8.9" (175 cm) Weight: 209 lb 7 oz (95 kg)  Physical Exam  Constitutional: He appears well-developed and well-nourished. No distress.  Slight cough, throat clearing, slightly nasal voice  HENT:  Head: Normocephalic and atraumatic. Head is without right periorbital erythema and without left periorbital erythema.  Right Ear: Tympanic membrane, external ear and ear canal normal. No drainage or tenderness. No foreign bodies. Tympanic membrane is not injected, not scarred, not perforated, not erythematous, not retracted and not bulging. No middle ear effusion.  Left Ear: Tympanic membrane, external ear and ear canal normal. No drainage or tenderness. No foreign bodies. Tympanic membrane is not injected, not scarred, not perforated, not erythematous, not retracted and not bulging.  No  middle ear effusion.  Nose: Nose normal. No mucosal edema, rhinorrhea, nose lacerations or sinus tenderness.  No foreign bodies.  Mouth/Throat: Oropharynx is clear and moist. No oropharyngeal exudate, posterior oropharyngeal edema, posterior oropharyngeal erythema or tonsillar abscesses.  Eyes: Lids are normal. Right eye exhibits no chemosis, no discharge and no exudate. No foreign body present in the right eye. Left eye exhibits no chemosis, no discharge and no exudate. No foreign body present in the left eye. Right conjunctiva is not injected. Left conjunctiva is not injected.  Neck: Neck supple. No tracheal tenderness present. No tracheal deviation and no edema present. No thyroid mass and no thyromegaly present.  Cardiovascular: Normal rate, regular rhythm, S1 normal and S2 normal.  Exam reveals no gallop.   No murmur heard. Pulmonary/Chest: No accessory muscle usage or stridor. No respiratory distress. He has wheezes (Scattered coarse expiratory wheezing). He has no rhonchi. He has no rales.  Abdominal: Soft. He exhibits no distension and no mass. There is no tenderness. There is no rebound and no guarding.  Musculoskeletal: He exhibits no edema.  Lymphadenopathy:       Head (right side): No tonsillar adenopathy present.       Head (left side): No tonsillar adenopathy present.    He has no cervical adenopathy.  Neurological: He is alert.  Skin: No rash noted. He is not diaphoretic.  Psychiatric: He has a normal mood and affect. His behavior is normal.    Diagnostics:  Allergy skin tests were performed. He demonstrated hypersensitivity against house dust mite, dog, and molds.  Spirometry was performed and demonstrated an FEV1 of 2.18 @ 73 % of predicted. Following the administration of nebulized albuterol his FEV1 rose to 2.34  The patient had an Asthma Control Test with the following results: ACT Total Score: 11.    He had an IgE level of 588 on 04/17/2015. He had a negative  hypersensitivity pneumonitis profile in 2012   Assessment and Plan:    1. Moderate persistent asthma, uncomplicated   2. Allergic rhinoconjunctivitis   3. Chronic sinusitis, unspecified location   4. LPRD (laryngopharyngeal reflux disease)      1. Allergen avoidance measures  2. Treat inflammation:   A. Dulera 200 2 inhalations twice a day with spacer device  B. Nasonex 1 spray each nostril one  time per day  C. montelukast 10 mg one tablet one time per day  3. Treat reflux:   A. Dexilant 60 mg in the a.m.  B. Pepcid 20 mg tablet 2 tablets in the p.m.  C. slowly taper off all caffeine and chocolate  4. If needed:   A. ProAir HFA 2 puffs every 4-6 hours  B. nasal saline washes  C. OTC antihistamine  D. Mucinex DM 2 tablets twice a day  5. Blood tests: IgA/G/M, antitetanus antibody titer: Anti-pneumococcal antibody titer  6. Return to clinic in 4 weeks  Edwin [name] has an inflamed and irritated respiratory tract most likely secondary to some architectural changes from his prolonged smoking, irritation of his airway from atopic disease, and irritation of his airway from reflux. Less likely is the possibility that he has a B-cell immune defect especially given the fact that he has demonstrated some elevation in IgE level. We'll have him be a little bit more aggressive about treating inflammation and reflux as stated above and we will perform a screening test for B-cell defect with the blood tests mentioned above. I will regroup with him in a possibly 4 weeks or earlier if there is a problem.    Allena Katz, MD [place]

## 2015-05-02 NOTE — Patient Instructions (Addendum)
  1. Allergen avoidance measures  2. Treat inflammation:   A. Dulera 200 2 inhalations twice a day with spacer device  B. Nasonex 1 spray each nostril one time per day  C. montelukast 10 mg one tablet one time per day  3. Treat reflux:   A. Dexilant 60 mg in the a.m.  B. Pepcid 20 mg tablet 2 tablets in the p.m.  C. slowly taper off all caffeine and chocolate  4. If needed:   A. ProAir HFA 2 puffs every 4-6 hours  B. nasal saline washes  C. OTC antihistamine  D. Mucinex DM 2 tablets twice a day  5. Blood tests: IgA/G/M, antitetanus antibody titer: Anti-pneumococcal antibody titer  6. Return to clinic in 4 weeks

## 2015-05-04 DIAGNOSIS — D849 Immunodeficiency, unspecified: Secondary | ICD-10-CM | POA: Diagnosis not present

## 2015-05-05 LAB — IGG, IGA, IGM
IGA: 193 mg/dL (ref 68–379)
IGM, SERUM: 41 mg/dL (ref 41–251)
IgG (Immunoglobin G), Serum: 816 mg/dL (ref 650–1600)

## 2015-05-06 LAB — TETANUS ANTIBODY, IGG: TETANUS ANTITOXID AB: 1.06 [IU]/mL (ref >0.15)

## 2015-05-08 ENCOUNTER — Ambulatory Visit (INDEPENDENT_AMBULATORY_CARE_PROVIDER_SITE_OTHER): Payer: Medicare Other | Admitting: Internal Medicine

## 2015-05-08 ENCOUNTER — Encounter: Payer: Self-pay | Admitting: Internal Medicine

## 2015-05-08 VITALS — BP 144/82 | HR 68 | Ht 70.5 in | Wt 209.0 lb

## 2015-05-08 DIAGNOSIS — J449 Chronic obstructive pulmonary disease, unspecified: Secondary | ICD-10-CM

## 2015-05-08 DIAGNOSIS — I10 Essential (primary) hypertension: Secondary | ICD-10-CM | POA: Diagnosis not present

## 2015-05-08 DIAGNOSIS — R05 Cough: Secondary | ICD-10-CM

## 2015-05-08 DIAGNOSIS — R059 Cough, unspecified: Secondary | ICD-10-CM

## 2015-05-08 MED ORDER — MOMETASONE FURO-FORMOTEROL FUM 100-5 MCG/ACT IN AERO: INHALATION_SPRAY | RESPIRATORY_TRACT | Status: DC

## 2015-05-08 MED ORDER — AMOXICILLIN-POT CLAVULANATE 875-125 MG PO TABS: 1.0000 | ORAL_TABLET | Freq: Two times a day (BID) | ORAL | Status: DC

## 2015-05-08 NOTE — Patient Instructions (Signed)
Augmentin 875 mg take one pill twice daily  X 10 days - take at breakfast and supper with large glass of water.  It would help reduce the usual side effects (diarrhea and yeast infections) if you ate cultured yogurt at lunch.   Stay on dulera 100 Take 2 puffs first thing in am and then another 2 puffs about 12 hours later.   Keep appt with Dr Neldon Mc to discuss elevated IgE on your tests in [facility]   Pulmonary follow up is as needed

## 2015-05-08 NOTE — Progress Notes (Signed)
Subjective:    Patient ID: Edwin [name], male    DOB: 1940-07-06    MRN: [id number]    Brief patient profile:  34 yowm quit smoking in 1985  Former pt of Dr Joya Gaskins with AB/ rhinitis    History of Present Illness  10/23/2014  Ov/ Dr Star Age  Chief Complaint  Patient presents with  . Follow-up    Pt stated productive cough with thick gren mucus x 4 days. Rx for prednisone and avelox were called in on 5/19. Pt states he started feeling better yesterday and that he is still coughing but does not have as much mucus production   Pt did well overall then on his own developed: thick green mucus x 4 days.  We Called in avelox and pred last week 4 days ago, then better over past few days, also nasonex started.  Notes some mucus out of the nose, on the netipot.  rec Finish prednisone and avelox Stay on inhalers Work on Left nostril for neti pot     02/15/2015 acute ov/[name] re: ?aecopd maint rx with spiriva/ symbicort - 95% better last ov / 5% "never better" x 5 years = daytime sense of pnds >>then worse first of sept 2016   Chief Complaint  Patient presents with  . Acute Visit    pt. of PW-Levaquin x 5days last wk.,last pill 4 days ago.C/o cough-still yellow, thick. Sob with exertion,wheezing when lays down,denies cp or tightness. No fcs.Has PND.  acute onset persisttent cough  wheeze and sob transiently some better p levaquin 500 x 5 days with prominent nasal congestion also Better transiently also with saba, comfortable at rest  rec Augmentin 875 mg take one pill twice daily  X 10 days -   Prednisone 10 mg take  4 each am x 2 days,   2 each am x 2 days,  1 each am x 2 days and stop  Work on inhaler technique:  If not all better call [facility] [phone] for sinus CT > did not do  Add pepcid 20 mg at bedtime for any flare of cough or wheeze   > did not do  GERD diet      03/06/2015  f/u ov/[name] re: copd/ refractory cough x 5 y with sensation of persistent daytime pnds but no excess mucus on  spirva dpi/ symb 160 rare saba  Chief Complaint  Patient presents with  . Follow-up    Pt states he is coughing less, but still producing some yellow sputum. His breathing is unchanged.   rec Stop losartan and start benicar 40-12.5 one half daily  Stop spiriva handihaler and start respimat 2 puffs every am  Stay on pepcid 20 mg at bedtime automatically for now  Please see patient coordinator before you leave today  to schedule sinus CT > neg  For cough > mucinex dm 1200 mg every 12 hours as needed and flutter valve  Work on inhaler technique:  relax and gently blow all the way out then take a nice smooth deep breath back in, triggering the inhaler at same time you start breathing in.  Hold for up to 5 seconds if you can. Blow out thru nose. Rinse and gargle with water when done Please schedule a follow up office visit in 6 weeks, call sooner if needed - bring all active meds in separate bags   Bag 1 automatic/ Bag 2 up to you       04/17/2015  f/u ov/[name] re: never  100% better x 5 years with AB/ sinus dz brought all meds as rec on max symb/ spiriva Chief Complaint  Patient presents with  . Follow-up    Pt states his breathing is unchanged. No new co's today.    Doe x MMRC1 = can walk nl pace, flat grade, can't hurry or go uphills or steps s sob   rec Dulera 100 Take 2 puffs first thing in am and then another 2 puffs about 12 hours later and try off symbiort /spiriva  Only use your albuterol  Ok to use ventolin  Before you do an activity that you know makes you short of breath Work on inhaler technique:   05/01/15 phone call. Green mucus > rx augmentin x 10 d  05/08/2015  f/u ov/[name] re: AB/ chronic rhinitis on singulair/ dulera 100 2bid - best he's felt in years  Chief Complaint  Patient presents with  . Follow-up    Pt states that his cough is much improved. No new co's today. He has not needed albuterol.    No noct symptoms at all / constant urge to clear throat during the day but  the deep prod cough is better on augmentin   No obvious day to day or daytime variability or assoc   cp or chest tightness, subjective wheeze or overt sinus or hb symptoms. No unusual exp hx or h/o childhood pna/ asthma or knowledge of premature birth.  Sleeping ok without nocturnal  or early am exacerbation  of respiratory  c/o's or need for noct saba. Also denies any obvious fluctuation of symptoms with weather or environmental changes or other aggravating or alleviating factors except as outlined above   Current Medications, Allergies, Complete Past Medical History, Past Surgical History, Family History, and Social History were reviewed in [employer] record.  ROS  The following are not active complaints unless bolded sore throat, dysphagia, dental problems, itching, sneezing,  nasal congestion or excess/ purulent secretions, ear ache,   fever, chills, sweats, unintended wt loss, classically pleuritic or exertional cp, hemoptysis,  orthopnea pnd or leg swelling, presyncope, palpitations, abdominal pain, anorexia, nausea, vomiting, diarrhea  or change in bowel or bladder habits, change in stools or urine, dysuria,hematuria,  rash, arthralgias, visual complaints, headache, numbness, weakness or ataxia or problems with walking or coordination,  change in mood/affect or memory.                 Objective:    amb wm still freq throat clearing despite no evidence at all of excessive pnd on exam  03/06/2015         207 >  04/17/2015  207 > 05/08/2015  209     02/15/15 209 lb (94.802 kg)  01/08/15 210 lb (95.255 kg)  12/26/14 209 lb 4 oz (94.915 kg)    Vital signs reviewed   HEENT: nl dentition, turbinates, and orophanx. Nl external ear canals without cough reflex   NECK :  without JVD/Nodes/TM/ nl carotid upstrokes bilaterally   LUNGS: no acc muscle use, clear to A and P , no cough on insp or exp   CV:  RRR  no s3 or murmur or increase in P2, no edema   ABD:   soft and nontender with nl excursion in the supine position. No bruits or organomegaly, bowel sounds nl  MS:  warm without deformities, calf tenderness, cyanosis or clubbing  SKIN: warm and dry without lesions    NEURO:  alert, approp, no deficits  CXR PA and Lateral:   02/15/2015 :     I personally reviewed images and agree with radiology impression as follows:   Minimal infiltrate left lung base cannot be excluded. Exam otherwise unremarkable.   Labs  reviewed:      Chemistry      Component Value Date/Time   NA 141 04/17/2015 0937   K 4.8 04/17/2015 0937   CL 101 04/17/2015 0937   CO2 29 04/17/2015 0937   BUN 15 04/17/2015 0937   CREATININE 1.15 04/17/2015 0937   CREATININE 1.11 03/14/2011 0000      Component Value Date/Time   CALCIUM 10.0 04/17/2015 0937   ALKPHOS 46 01/08/2015 1518   AST 19 01/08/2015 1518   ALT 17 01/08/2015 1518   BILITOT 0.9 01/08/2015 1518        Lab Results  Component Value Date   WBC 7.8 04/17/2015   HGB 15.7 04/17/2015   HCT 46.7 04/17/2015   MCV 95.0 04/17/2015   PLT 187.0 04/17/2015        Lab Results  Component Value Date   TSH 1.78 04/17/2015     Lab Results  Component Value Date   PROBNP 20.0 04/17/2015               Assessment & Plan:    Outpatient Encounter Prescriptions as of 05/08/2015  Medication Sig  . albuterol (PROVENTIL HFA) 108 (90 BASE) MCG/ACT inhaler Inhale 2 puffs into the lungs every 4 (four) hours as needed for wheezing or shortness of breath. 1-2 puffs every 4 hrs as needed.  [name] Kitchen allopurinol (ZYLOPRIM) 300 MG tablet Take 1 tablet (300 mg total) by mouth daily.  [name] Kitchen amoxicillin-clavulanate (AUGMENTIN) 875-125 MG tablet Take 1 tablet by mouth 2 (two) times daily.  . bimatoprost (LUMIGAN) 0.03 % ophthalmic solution Place 1 drop into both eyes at bedtime.    . brimonidine (ALPHAGAN P) 0.1 % SOLN 1 drop 2 (two) times daily. Both eyes   . dexlansoprazole (DEXILANT) 60 MG capsule Take 1 capsule (60 mg  total) by mouth daily.  [name] Kitchen dextromethorphan-guaiFENesin (MUCINEX DM) 30-600 MG 12hr tablet Take 1 tablet by mouth daily.  . famotidine (PEPCID) 20 MG tablet One at bedtime  . FLUoxetine (PROZAC) 10 MG capsule TAKE ONE CAPSULE BY MOUTH ONE TIME DAILY.  [name] Kitchen Glucosamine-Chondroit-Vit C-Mn (GLUCOSAMINE 1500 COMPLEX PO) Take 2 tablets by mouth daily.    . Hypertonic Nasal Wash (SINUS RINSE KIT NA) daily.  [name] Kitchen ibuprofen (ADVIL,MOTRIN) 200 MG tablet Take 200 mg by mouth every 6 (six) hours as needed.  . mometasone-formoterol (DULERA) 100-5 MCG/ACT AERO Take 2 puffs first thing in am and then another 2 puffs about 12 hours later.  . montelukast (SINGULAIR) 10 MG tablet Take 1 tablet (10 mg total) by mouth at bedtime.  [name] Kitchen NASONEX 50 MCG/ACT nasal spray place 2 sprays into each nostril daily as needed  . olmesartan-hydrochlorothiazide (BENICAR HCT) 40-12.5 MG tablet One half daily  . psyllium (METAMUCIL) 58.6 % powder Take 1 packet by mouth daily.    [name] Kitchen Respiratory Therapy Supplies (FLUTTER) DEVI Take as directed two to three times daily as needed  . [DISCONTINUED] amoxicillin-clavulanate (AUGMENTIN) 875-125 MG tablet Take 1 tablet by mouth 2 (two) times daily.  . [DISCONTINUED] mometasone-formoterol (DULERA) 100-5 MCG/ACT AERO Take 2 puffs first thing in am and then another 2 puffs about 12 hours later.   No facility-administered encounter medications on file as of 05/08/2015.

## 2015-05-09 ENCOUNTER — Encounter: Payer: Self-pay | Admitting: Internal Medicine

## 2015-05-09 LAB — PNEUMOCOCCAL IM (14 SEROTYPE)
PNEUMO AB TYPE 3: 16 ug/mL (ref >1.3)
PNEUMO AB TYPE 4: 1.4 ug/mL (ref >1.3)
PNEUMO AB TYPE 57 (19A): 13.3 ug/mL (ref >1.3)
Pneumo Ab Type 1*: 6.9 ug/mL (ref >1.3)
Pneumo Ab Type 12 (12F)*: 7.6 ug/mL (ref >1.3)
Pneumo Ab Type 14*: 22.1 ug/mL (ref >1.3)
Pneumo Ab Type 19 (19F)*: 16.8 ug/mL (ref >1.3)
Pneumo Ab Type 23 (23F)*: 2 ug/mL (ref >1.3)
Pneumo Ab Type 26 (6B)*: 21.1 ug/mL (ref >1.3)
Pneumo Ab Type 51 (7F)*: 4.5 ug/mL (ref >1.3)
Pneumo Ab Type 56 (18C)*: >17.7 ug/mL (ref >1.3)
Pneumo Ab Type 68 (9V)*: >21.2 ug/mL (ref >1.3)
Pneumo Ab Type 8*: 5 ug/mL (ref >1.3)
Pneumo Ab Type 9 (9N)*: 19.4 ug/mL (ref >1.3)

## 2015-05-09 NOTE — Assessment & Plan Note (Signed)
Changed to benicar 20/12.5 one half daily 03/06/2015 due to poor control and pnds  Adequate control on present rx, reviewed > no change in rx needed  > Follow up per Primary Care planned

## 2015-05-09 NOTE — Assessment & Plan Note (Signed)
PFTs 01/08/11 with min airflow obstruction ONO 8/12: normal , no desaturation 03/2011 CT chest neg bronchiectasis CT sinus: mild maxillary sinusitis  - 04/17/2015  Walked RA x 3 laps @ 185 ft each stopped due to End of study, brisk  pace, no   desat  Mild sob - spirometry 04/17/2015  No signficant airflow obst   - 05/08/2015  extensive coaching HFA effectiveness =   90%  From a baseline of 75%   He is probably better characterized as AB with large component UACS with no sign copd at this point and no need for pulmonary f/u now that he's established with Dr Neldon Mc

## 2015-05-09 NOTE — Assessment & Plan Note (Signed)
Trial off dpi and losartan 03/06/2015  - CT sinus 03/09/2015 : There is mild to moderate mucosal thickening involving the maxillaries sinuses. No fluid levels identified - Allergy profile 04/17/15 >  IgE 588  Cat > dog > allergy eval rec  - Singulair rx per Dr Neldon Mc 05/01/15  - Augmentin rx 05/01/15 x 20 days empirically for "greeen sputum"   Most likely this is  Classic Upper airway cough syndrome, so named because it's frequently impossible to sort out how much is  CR/sinusitis with freq throat clearing (which can be related to primary GERD)   vs  causing  secondary (" extra esophageal")  GERD from wide swings in gastric pressure that occur with throat clearing, often  promoting self use of mint and menthol lozenges that reduce the lower esophageal sphincter tone and exacerbate the problem further in a cyclical fashion.   These are the same pts (now being labeled as having "irritable larynx syndrome" by some cough centers) who not infrequently have a history of having failed to tolerate ace inhibitors,  dry powder inhalers or biphosphonates or report having atypical reflux symptoms that don't respond to standard doses of PPI , and are easily confused as having aecopd or asthma flares by even experienced allergists/ pulmonologists.   Not clear if it was the addition of singulair or the augmentin but he's clearly better with main issue outstanding is whether would benefit from immunotherapy / xolair or one of the new IL 5 modulators > defer to Dr Neldon Mc   I had an extended discussion with the patient reviewing all relevant studies completed to date and  lasting 15 to 20 minutes of a 25 minute visit    Each maintenance medication was reviewed in detail including most importantly the difference between maintenance and prns and under what circumstances the prns are to be triggered using an action plan format that is not reflected in the computer generated alphabetically organized AVS.    Please see  instructions for details which were reviewed in writing and the patient given a copy highlighting the part that I personally wrote and discussed at today's ov.

## 2015-05-10 ENCOUNTER — Telehealth: Payer: Self-pay | Admitting: Internal Medicine

## 2015-05-10 NOTE — Telephone Encounter (Signed)
Called and spoke with Edwin [name] at [facility]. She stated that the pharmacy did receive the rx for dulera and Augmentin and they will fill it and have it ready for pick up today.  Called and spoke with patient. I explained to the patient that both rx were sent and [facility] at [facility]. He voiced understanding and had no further questions. Nothing further needed.

## 2015-06-06 ENCOUNTER — Encounter: Payer: Self-pay | Admitting: Allergy and Immunology

## 2015-06-06 ENCOUNTER — Ambulatory Visit (INDEPENDENT_AMBULATORY_CARE_PROVIDER_SITE_OTHER): Payer: Medicare Other | Admitting: Allergy and Immunology

## 2015-06-06 VITALS — BP 130/80 | HR 74 | Resp 16

## 2015-06-06 DIAGNOSIS — J387 Other diseases of larynx: Secondary | ICD-10-CM | POA: Diagnosis not present

## 2015-06-06 DIAGNOSIS — H1010 Acute atopic conjunctivitis, unspecified eye: Secondary | ICD-10-CM

## 2015-06-06 DIAGNOSIS — J449 Chronic obstructive pulmonary disease, unspecified: Secondary | ICD-10-CM | POA: Diagnosis not present

## 2015-06-06 DIAGNOSIS — J45909 Unspecified asthma, uncomplicated: Secondary | ICD-10-CM

## 2015-06-06 DIAGNOSIS — K219 Gastro-esophageal reflux disease without esophagitis: Secondary | ICD-10-CM | POA: Insufficient documentation

## 2015-06-06 DIAGNOSIS — Z8709 Personal history of other diseases of the respiratory system: Secondary | ICD-10-CM | POA: Diagnosis not present

## 2015-06-06 DIAGNOSIS — J309 Allergic rhinitis, unspecified: Secondary | ICD-10-CM | POA: Diagnosis not present

## 2015-06-06 NOTE — Progress Notes (Signed)
[facility]  Follow-up Note  Referring Provider: Janith Lima, MD Primary Provider: Scarlette Calico, MD Date of Office Visit: 06/06/2015  Subjective:   Edwin [name] is a 75 y.o. male who returns to the [facility] in re-evaluation of the following:  HPI Comments:  Edwin [name] returns to this  Clinic on 0/09/2015 in reevaluation of his respiratory tract problems. He is doing very well and in fact this is the best she's felt that a prolonged period in time. He has no coughing, no nasal congestion, no green nasal discharge, no gagging or retching, no shortness of breath and overall feels quite good. He's been very good about using his medications on a consistent basis including his Dulera, Nasonex, montelukast, the Dexilant, Pepcid and very rare use of pro-air HFA. He finished his course of Augmentin. He has eliminated all caffeine consumption. He has not performed house dust avoidance measures in the bedroom.   Current Outpatient Prescriptions on File Prior to Visit  Medication Sig Dispense Refill  . albuterol (PROVENTIL HFA) 108 (90 BASE) MCG/ACT inhaler Inhale 2 puffs into the lungs every 4 (four) hours as needed for wheezing or shortness of breath. 1-2 puffs every 4 hrs as needed. 1 Inhaler 0  . allopurinol (ZYLOPRIM) 300 MG tablet Take 1 tablet (300 mg total) by mouth daily. 90 tablet 1  . amoxicillin-clavulanate (AUGMENTIN) 875-125 MG tablet Take 1 tablet by mouth 2 (two) times daily. 20 tablet 0  . bimatoprost (LUMIGAN) 0.03 % ophthalmic solution Place 1 drop into both eyes at bedtime.      . brimonidine (ALPHAGAN P) 0.1 % SOLN 1 drop 2 (two) times daily. Both eyes     . dexlansoprazole (DEXILANT) 60 MG capsule Take 1 capsule (60 mg total) by mouth daily. 90 capsule 3  . dextromethorphan-guaiFENesin (MUCINEX DM) 30-600 MG 12hr tablet Take 1 tablet by mouth daily.    . famotidine (PEPCID) 20 MG tablet One at bedtime     . FLUoxetine (PROZAC) 10 MG capsule TAKE ONE CAPSULE BY MOUTH ONE TIME DAILY. 90 capsule 3  . Glucosamine-Chondroit-Vit C-Mn (GLUCOSAMINE 1500 COMPLEX PO) Take 2 tablets by mouth daily.      . Hypertonic Nasal Wash (SINUS RINSE KIT NA) daily.    [name] Kitchen ibuprofen (ADVIL,MOTRIN) 200 MG tablet Take 200 mg by mouth every 6 (six) hours as needed.    . montelukast (SINGULAIR) 10 MG tablet Take 1 tablet (10 mg total) by mouth at bedtime. 90 tablet 3  . NASONEX 50 MCG/ACT nasal spray place 2 sprays into each nostril daily as needed 17 g 4  . olmesartan-hydrochlorothiazide (BENICAR HCT) 40-12.5 MG tablet One half daily 30 tablet 3  . psyllium (METAMUCIL) 58.6 % powder Take 1 packet by mouth daily.      [name] Kitchen Respiratory Therapy Supplies (FLUTTER) DEVI Take as directed two to three times daily as needed     No current facility-administered medications on file prior to visit.    No orders of the defined types were placed in this encounter.    Past Medical History  Diagnosis Date  . Glaucoma   . Gout   . Hyperlipidemia     NMR lipoprofile 2005: LDL 113(1416/825), HLD 37, TG 190.  [name] Kitchen Hypertension   . COPD (chronic obstructive pulmonary disease) (HCC)     reactive airway disease  . Skin cancer     hx of basal cell x 1, 2003, Dr.Patseavours  .  Hx of colonic polyps     Dr Earlean Shawl    Past Surgical History  Procedure Laterality Date  . Tear duct surgery  1998    for excess tearing, skin graft L foot @ age 57 1/2  . Cataract extraction      bilat, implants  . Cholecystectomy  1995  . Polypectomy  2003     neg 2006, Dr.Medoff  . Lumbar laminectomy  1982    Allergies  Allergen Reactions  . Ramipril     REACTION: chest congestion & cough. No angioedema    Review of systems negative except as noted in HPI / PMHx or noted below:  Review of Systems  Constitutional: Negative.   HENT: Negative.   Eyes: Negative.   Respiratory: Negative.   Cardiovascular: Negative.   Gastrointestinal: Negative.    Genitourinary: Negative.   Musculoskeletal: Negative.   Skin: Negative.   Neurological: Negative.   Endo/Heme/Allergies: Negative.   Psychiatric/Behavioral: Negative.      Objective:   Filed Vitals:   06/06/15 0850  BP: 130/80  Pulse: 74  Resp: 16          Physical Exam  Constitutional: He is well-developed, well-nourished, and in no distress. No distress.  HENT:  Head: Normocephalic.  Right Ear: Tympanic membrane, external ear and ear canal normal.  Left Ear: Tympanic membrane, external ear and ear canal normal.  Nose: Nose normal. No mucosal edema or rhinorrhea.  Mouth/Throat: Uvula is midline, oropharynx is clear and moist and mucous membranes are normal. No oropharyngeal exudate.  Eyes: Conjunctivae are normal.  Neck: Trachea normal. No tracheal tenderness present. No tracheal deviation present. No thyromegaly present.  Cardiovascular: Normal rate, regular rhythm, S1 normal, S2 normal and normal heart sounds.   No murmur heard. Pulmonary/Chest: Breath sounds normal. No stridor. No respiratory distress. He has no wheezes. He has no rales.  Musculoskeletal: He exhibits no edema.  Lymphadenopathy:       Head (right side): No tonsillar adenopathy present.       Head (left side): No tonsillar adenopathy present.    He has no cervical adenopathy.    He has no axillary adenopathy.  Neurological: He is alert. Gait normal.  Skin: No rash noted. He is not diaphoretic. No erythema. Nails show no clubbing.  Psychiatric: Mood and affect normal.    Diagnostics:    Spirometry was performed and demonstrated an FEV1 of 2.21 at 66 % of predicted.   Assessment and Plan:   1. COPD with asthma ([place])   2. Allergic rhinoconjunctivitis   3. History of chronic sinusitis   4. LPRD (laryngopharyngeal reflux disease)      1. Allergen avoidance measures - Dust Mite  2. Treat inflammation:   A. Dulera 200 2 inhalations twice a day with spacer device  B. Nasonex 1 spray each  nostril one time per day  C. montelukast 10 mg one tablet one time per day  3. Treat reflux:   A. Dexilant 60 mg in the a.m.  B. Pepcid 20 mg tablet 2 tablets in the p.m.  C. slowly taper off all caffeine and chocolate  4. If needed:   A. ProAir HFA 2 puffs every 4-6 hours  B. nasal saline washes  C. OTC antihistamine  D. Mucinex DM 2 tablets twice a day  5. Return to clinic in 12 weeks or earlier if problem  Edwin [name] is doing well and we will continue to have him use therapy directed against both inflammation and reflux  at this point in time and regroup with him an approximate 12 weeks or earlier if there is a problem. I did encourage him to perform avoidance measures directed against house dust mite as today he has not performed any of these avoidance measures. He will contact me during the interval should he have significant problem.  Allena Katz, MD [place]

## 2015-06-06 NOTE — Patient Instructions (Signed)
  1. Allergen avoidance measures - Dust Mite  2. Treat inflammation:   A. Dulera 200 2 inhalations twice a day with spacer device  B. Nasonex 1 spray each nostril one time per day  C. montelukast 10 mg one tablet one time per day  3. Treat reflux:   A. Dexilant 60 mg in the a.m.  B. Pepcid 20 mg tablet 2 tablets in the p.m.  C. slowly taper off all caffeine and chocolate  4. If needed:   A. ProAir HFA 2 puffs every 4-6 hours  B. nasal saline washes  C. OTC antihistamine  D. Mucinex DM 2 tablets twice a day  5. Return to clinic in 12 weeks or earlier if problem

## 2015-06-13 ENCOUNTER — Telehealth: Payer: Self-pay | Admitting: Allergy and Immunology

## 2015-06-13 MED ORDER — PREDNISONE 10 MG PO TABS: ORAL_TABLET | ORAL | Status: DC

## 2015-06-13 MED ORDER — AMOXICILLIN-POT CLAVULANATE 875-125 MG PO TABS: ORAL_TABLET | ORAL | Status: DC

## 2015-06-13 MED ORDER — BECLOMETHASONE DIPROPIONATE 80 MCG/ACT IN AERS: 2.0000 | INHALATION_SPRAY | Freq: Two times a day (BID) | RESPIRATORY_TRACT | Status: DC

## 2015-06-13 NOTE — Telephone Encounter (Signed)
Pt was told by dr. Neldon Mc to call if he wasn't feeling better. His symptoms are cough, yellow mucus, chest congestion, no fever.  [facility] [place]

## 2015-06-13 NOTE — Telephone Encounter (Signed)
Please have patient take Augmentin 875 one tablet two times per day for 14 days and Prednisone 10mg  one tablet two times a day for 7 days and add Qvar 80 two inhalations two times a day to [facility] while "sick" and perform house dust mite avoidance measures.

## 2015-06-13 NOTE — Telephone Encounter (Signed)
Left sample at the front and sent in prescriptions to patients pharmacy. Patient notified and will follow up with Dr Neldon Mc if medications doesn't help.

## 2015-08-04 ENCOUNTER — Other Ambulatory Visit: Payer: Self-pay | Admitting: Internal Medicine

## 2015-08-29 ENCOUNTER — Ambulatory Visit (INDEPENDENT_AMBULATORY_CARE_PROVIDER_SITE_OTHER): Payer: Medicare Other | Admitting: Allergy and Immunology

## 2015-08-29 VITALS — BP 130/84 | HR 78 | Resp 16

## 2015-08-29 DIAGNOSIS — J387 Other diseases of larynx: Secondary | ICD-10-CM

## 2015-08-29 DIAGNOSIS — Z8709 Personal history of other diseases of the respiratory system: Secondary | ICD-10-CM | POA: Diagnosis not present

## 2015-08-29 DIAGNOSIS — J441 Chronic obstructive pulmonary disease with (acute) exacerbation: Secondary | ICD-10-CM

## 2015-08-29 DIAGNOSIS — H1010 Acute atopic conjunctivitis, unspecified eye: Secondary | ICD-10-CM | POA: Diagnosis not present

## 2015-08-29 DIAGNOSIS — K219 Gastro-esophageal reflux disease without esophagitis: Secondary | ICD-10-CM

## 2015-08-29 DIAGNOSIS — J309 Allergic rhinitis, unspecified: Secondary | ICD-10-CM | POA: Diagnosis not present

## 2015-08-29 DIAGNOSIS — J45901 Unspecified asthma with (acute) exacerbation: Secondary | ICD-10-CM | POA: Diagnosis not present

## 2015-08-29 MED ORDER — METHYLPREDNISOLONE ACETATE 80 MG/ML IJ SUSP
80.0000 mg | Freq: Once | INTRAMUSCULAR | Status: AC
  Administered 2015-08-29: 80 mg via INTRAMUSCULAR

## 2015-08-29 MED ORDER — UMECLIDINIUM BROMIDE 62.5 MCG/INH IN AEPB: 1.0000 | INHALATION_SPRAY | Freq: Every day | RESPIRATORY_TRACT | Status: DC

## 2015-08-29 MED ORDER — FLUTICASONE FUROATE-VILANTEROL 200-25 MCG/INH IN AEPB: 1.0000 | INHALATION_SPRAY | Freq: Every day | RESPIRATORY_TRACT | Status: DC

## 2015-08-29 NOTE — Patient Instructions (Signed)
  1. Continue allergen avoidance measures - Dust Mite  2. Treat inflammation:   A. Change Dulera to Breo 200 one inhalation one time per day  B. Start Incruze One inhalation one time per day  C. Continue Nasonex 1 spray each nostril one time per day  D. Continue montelukast 10 mg one tablet one time per day  E. Depomedrol 80 IM delivered in clinic today  3. Treat reflux:   A. Dexilant 60 mg in the a.m.  B. Pepcid 20 mg tablet 2 tablets in the p.m.  C. Continue off all caffeine and chocolate  4. If needed:   A. ProAir HFA 2 puffs every 4-6 hours  B. nasal saline washes  C. OTC antihistamine  D. Mucinex DM 2 tablets twice a day  5. Return to clinic in 12 weeks or earlier if problem

## 2015-08-29 NOTE — Progress Notes (Signed)
Follow-up Note  Referring Provider: Janith Lima, MD Primary Provider: Scarlette Calico, MD Date of Office Visit: 08/29/2015  Subjective:   Edwin [name] (DOB: [birth year]) is a 75 y.o. male who returns to the [facility] on 08/29/2015 in re-evaluation of the following:  HPI Comments: Edwin [name] returns to this clinic in reevaluation of his COPD with component of asthma, allergic rhinitis, and LPR. Although he is doing much better on his current medical plan than he was prior to utilizing any planned directed against inflammation and reflux he still continues to have daily cough and phlegm production and still has some throat clearing on occasion. He has performed house dust avoidance measures, has eliminated all caffeine consumption, and is been very good about utilizing all of his medications including his Dulera, montelukast, nasal steroid, and a combination of Dexilant and Pepcid.     Medication List           albuterol 108 (90 Base) MCG/ACT inhaler  Commonly known as:  PROVENTIL HFA  Inhale 2 puffs into the lungs every 4 (four) hours as needed for wheezing or shortness of breath. 1-2 puffs every 4 hrs as needed.     allopurinol 300 MG tablet  Commonly known as:  ZYLOPRIM  TAKE 1 TABLET (300 MG TOTAL) BY MOUTH DAILY.     amoxicillin-clavulanate 875-125 MG tablet  Commonly known as:  AUGMENTIN  Take 1 tablet by mouth 2 (two) times daily.     amoxicillin-clavulanate 875-125 MG tablet  Commonly known as:  AUGMENTIN  Take one tablet twice daily for 14 days     beclomethasone 80 MCG/ACT inhaler  Commonly known as:  QVAR  Inhale 2 puffs into the lungs 2 (two) times daily.     bimatoprost 0.03 % ophthalmic solution  Commonly known as:  LUMIGAN  Place 1 drop into both eyes at bedtime.     brimonidine 0.1 % Soln  Commonly known as:  ALPHAGAN P  1 drop 2 (two) times daily. Both eyes     dexlansoprazole 60 MG capsule  Commonly known as:  DEXILANT  Take 1  capsule (60 mg total) by mouth daily.     dextromethorphan-guaiFENesin 30-600 MG 12hr tablet  Commonly known as:  MUCINEX DM  Take 1 tablet by mouth daily.     DULERA 200-5 MCG/ACT [place]ero  Generic drug:  mometasone-formoterol  Inhale 2 puffs into the lungs 2 (two) times daily.     famotidine 20 MG tablet  Commonly known as:  PEPCID  One at bedtime     FLUoxetine 10 MG capsule  Commonly known as:  PROZAC  TAKE ONE CAPSULE BY MOUTH ONE TIME DAILY.     FLUTTER Devi  Take as directed two to three times daily as needed     GLUCOSAMINE 1500 COMPLEX PO  Take 2 tablets by mouth daily.     ibuprofen 200 MG tablet  Commonly known as:  ADVIL,MOTRIN  Take 200 mg by mouth every 6 (six) hours as needed.     montelukast 10 MG tablet  Commonly known as:  SINGULAIR  Take 1 tablet (10 mg total) by mouth at bedtime.     NASONEX 50 MCG/ACT nasal spray  Generic drug:  mometasone  place 2 sprays into each nostril daily as needed     olmesartan-hydrochlorothiazide 40-12.5 MG tablet  Commonly known as:  BENICAR HCT  One half daily     predniSONE 10 MG tablet  Commonly known as:  DELTASONE  Take one tablet twice daily for 7 days.     psyllium 58.6 % powder  Commonly known as:  METAMUCIL  Take 1 packet by mouth daily.     SINUS RINSE KIT NA  daily.        Past Medical History  Diagnosis Date  . Glaucoma   . Gout   . Hyperlipidemia     NMR lipoprofile 2005: LDL 113(1416/825), HLD 37, TG 190.  [name] Kitchen Hypertension   . COPD (chronic obstructive pulmonary disease) (HCC)     reactive airway disease  . Skin cancer     hx of basal cell x 1, 2003, Dr.Patseavours  . Hx of colonic polyps     Dr Earlean Shawl    Past Surgical History  Procedure Laterality Date  . Tear duct surgery  1998    for excess tearing, skin graft L foot @ age 24 1/2  . Cataract extraction      bilat, implants  . Cholecystectomy  1995  . Polypectomy  2003     neg 2006, Dr.Medoff  . Lumbar laminectomy  1982     Allergies  Allergen Reactions  . Ramipril     REACTION: chest congestion & cough. No angioedema    Review of systems negative except as noted in HPI / PMHx or noted below:  Review of Systems  Constitutional: Negative.   HENT: Negative.   Eyes: Negative.   Respiratory: Negative.   Cardiovascular: Negative.   Gastrointestinal: Negative.   Genitourinary: Negative.   Musculoskeletal: Negative.   Skin: Negative.   Neurological: Negative.   Endo/Heme/Allergies: Negative.   Psychiatric/Behavioral: Negative.      Objective:   Filed Vitals:   08/29/15 1014  BP: 130/84  Pulse: 78  Resp: 16          Physical Exam  Constitutional: He is well-developed, well-nourished, and in no distress.  Slight cough  HENT:  Head: Normocephalic.  Right Ear: Tympanic membrane, external ear and ear canal normal.  Left Ear: Tympanic membrane, external ear and ear canal normal.  Nose: Nose normal. No mucosal edema or rhinorrhea.  Mouth/Throat: Uvula is midline, oropharynx is clear and moist and mucous membranes are normal. No oropharyngeal exudate.  Eyes: Conjunctivae are normal.  Neck: Trachea normal. No tracheal tenderness present. No tracheal deviation present. No thyromegaly present.  Cardiovascular: Normal rate, regular rhythm, S1 normal, S2 normal and normal heart sounds.   No murmur heard. Pulmonary/Chest: No stridor. No respiratory distress. He has wheezes. He has no rales.  Musculoskeletal: He exhibits no edema.  Lymphadenopathy:       Head (right side): No tonsillar adenopathy present.       Head (left side): No tonsillar adenopathy present.    He has no cervical adenopathy.    He has no axillary adenopathy.  Neurological: He is alert. Gait normal.  Skin: No rash noted. He is not diaphoretic. No erythema. Nails show no clubbing.  Psychiatric: Mood and affect normal.    Diagnostics:    Spirometry was performed and demonstrated an FEV1 of 2.36 at 83 % of predicted.  The  patient had an Asthma Control Test with the following results:  .    Assessment and Plan:   1. Acute exacerbation of COPD with asthma ([place])   2. Allergic rhinoconjunctivitis   3. History of chronic sinusitis   4. LPRD (laryngopharyngeal reflux disease)     1. Continue allergen avoidance measures - Dust Mite  2. Treat inflammation:  A. Change Dulera to Breo 200 one inhalation one time per day  B. Start Incruze One inhalation one time per day  C. Continue Nasonex 1 spray each nostril one time per day  D. Continue montelukast 10 mg one tablet one time per day  E. Depomedrol 80 IM delivered in clinic today  3. Treat reflux:   A. Dexilant 60 mg in the a.m.  B. Pepcid 20 mg tablet 2 tablets in the p.m.  C. Continue off all caffeine and chocolate  4. If needed:   A. ProAir HFA 2 puffs every 4-6 hours  B. nasal saline washes  C. OTC antihistamine  D. Mucinex DM 2 tablets twice a day  5. Return to clinic in 12 weeks or earlier if problem  JormanStill appears to have inflammation of his respiratory tract and I will change around his anti-inflammatory medication and have him use a combination of Breo and incruze and a short course of systemic steroids to see if we can completely clear out the inflammation. I'll regroup with him an approximate 12 weeks or earlier if there is a problem. He will continue to use aggressive therapy directed against reflux at this point in time.  Allena Katz, MD [place]

## 2015-08-30 ENCOUNTER — Encounter: Payer: Self-pay | Admitting: Allergy and Immunology

## 2015-10-01 ENCOUNTER — Telehealth: Payer: Self-pay | Admitting: *Deleted

## 2015-10-01 ENCOUNTER — Other Ambulatory Visit: Payer: Self-pay

## 2015-10-01 MED ORDER — PREDNISONE 10 MG PO TABS: 10.0000 mg | ORAL_TABLET | Freq: Two times a day (BID) | ORAL | Status: DC

## 2015-10-01 MED ORDER — AMOXICILLIN-POT CLAVULANATE 875-125 MG PO TABS: 1.0000 | ORAL_TABLET | Freq: Two times a day (BID) | ORAL | Status: DC

## 2015-10-01 NOTE — Telephone Encounter (Signed)
Please advise 

## 2015-10-01 NOTE — Telephone Encounter (Signed)
Sent in meds called and informed pt

## 2015-10-01 NOTE — Telephone Encounter (Signed)
Pt has COPD and feels likes it is causing problems, he has a sore throat and is coughing up mucus. Pt wants to know if you can call something in for him. He feels like it is getting into his lungs. [facility] [address] in [place].

## 2015-10-01 NOTE — Telephone Encounter (Signed)
Please provide patient with Augmentin 875 one tablet two times a day for 14 days and prednisone 10mg  one tablet two times a day for 7 days

## 2015-10-23 ENCOUNTER — Encounter: Payer: Self-pay | Admitting: Allergy and Immunology

## 2015-10-23 ENCOUNTER — Ambulatory Visit (INDEPENDENT_AMBULATORY_CARE_PROVIDER_SITE_OTHER): Payer: Medicare Other | Admitting: Allergy and Immunology

## 2015-10-23 VITALS — BP 130/80 | HR 88 | Resp 16

## 2015-10-23 DIAGNOSIS — J45901 Unspecified asthma with (acute) exacerbation: Secondary | ICD-10-CM | POA: Diagnosis not present

## 2015-10-23 DIAGNOSIS — K219 Gastro-esophageal reflux disease without esophagitis: Secondary | ICD-10-CM

## 2015-10-23 DIAGNOSIS — H1010 Acute atopic conjunctivitis, unspecified eye: Secondary | ICD-10-CM

## 2015-10-23 DIAGNOSIS — J387 Other diseases of larynx: Secondary | ICD-10-CM | POA: Diagnosis not present

## 2015-10-23 DIAGNOSIS — J309 Allergic rhinitis, unspecified: Secondary | ICD-10-CM | POA: Diagnosis not present

## 2015-10-23 DIAGNOSIS — J441 Chronic obstructive pulmonary disease with (acute) exacerbation: Secondary | ICD-10-CM

## 2015-10-23 DIAGNOSIS — Z8709 Personal history of other diseases of the respiratory system: Secondary | ICD-10-CM | POA: Diagnosis not present

## 2015-10-23 NOTE — Progress Notes (Signed)
Follow-up Note  Referring Provider: Janith Lima, MD Primary Provider: Scarlette Calico, MD Date of Office Visit: 10/23/2015  Subjective:   Edwin [name] (DOB: 07/26/1940) is a 75 y.o. male who returns to the [facility] on 10/23/2015 in re-evaluation of the following:  HPI: Edwin [name] returns to this clinic in reevaluation of his COPD with component of asthma, allergic rhinitis and LPR. He had a very short course of systemic steroids and a course of Augmentin recently which did help his coughing and sputum production. He's no longer making yellow-green discharge he still continues to cough and still has a congested chest even in the face of utilizing a large collection of medical therapy as noted below. His reflux is under very good control and his nose is been doing well.    Medication List           albuterol 108 (90 Base) MCG/ACT inhaler  Commonly known as:  PROVENTIL HFA  Inhale 2 puffs into the lungs every 4 (four) hours as needed for wheezing or shortness of breath. 1-2 puffs every 4 hrs as needed.     allopurinol 300 MG tablet  Commonly known as:  ZYLOPRIM  TAKE 1 TABLET (300 MG TOTAL) BY MOUTH DAILY.     bimatoprost 0.03 % ophthalmic solution  Commonly known as:  LUMIGAN  Place 1 drop into both eyes at bedtime.     brimonidine 0.1 % Soln  Commonly known as:  ALPHAGAN P  1 drop 2 (two) times daily. Both eyes     dexlansoprazole 60 MG capsule  Commonly known as:  DEXILANT  Take 1 capsule (60 mg total) by mouth daily.     dextromethorphan-guaiFENesin 30-600 MG 12hr tablet  Commonly known as:  MUCINEX DM  Take 1 tablet by mouth daily.     famotidine 20 MG tablet  Commonly known as:  PEPCID  One at bedtime     FLUoxetine 10 MG capsule  Commonly known as:  PROZAC  TAKE ONE CAPSULE BY MOUTH ONE TIME DAILY.     fluticasone furoate-vilanterol 200-25 MCG/INH Aepb  Commonly known as:  BREO ELLIPTA  Inhale 1 puff into the lungs daily.     FLUTTER  Devi  Take as directed two to three times daily as needed     GLUCOSAMINE 1500 COMPLEX PO  Take 2 tablets by mouth daily.     ibuprofen 200 MG tablet  Commonly known as:  ADVIL,MOTRIN  Take 200 mg by mouth every 6 (six) hours as needed.     montelukast 10 MG tablet  Commonly known as:  SINGULAIR  Take 1 tablet (10 mg total) by mouth at bedtime.     NASONEX 50 MCG/ACT nasal spray  Generic drug:  mometasone  place 2 sprays into each nostril daily as needed     olmesartan-hydrochlorothiazide 40-12.5 MG tablet  Commonly known as:  BENICAR HCT  One half daily     psyllium 58.6 % powder  Commonly known as:  METAMUCIL  Take 1 packet by mouth daily.     SINUS RINSE KIT NA  daily.     umeclidinium bromide 62.5 MCG/INH Aepb  Commonly known as:  INCRUSE ELLIPTA  Inhale 1 puff into the lungs daily.        Past Medical History  Diagnosis Date  . Glaucoma   . Gout   . Hyperlipidemia     NMR lipoprofile 2005: LDL 113(1416/825), HLD 37, TG 190.  [name] Kitchen Hypertension   .  COPD (chronic obstructive pulmonary disease) (HCC)     reactive airway disease  . Skin cancer     hx of basal cell x 1, 2003, Dr.Patseavours  . Hx of colonic polyps     Dr Earlean Shawl    Past Surgical History  Procedure Laterality Date  . Tear duct surgery  1998    for excess tearing, skin graft L foot @ age 34 1/2  . Cataract extraction      bilat, implants  . Cholecystectomy  1995  . Polypectomy  2003     neg 2006, Dr.Medoff  . Lumbar laminectomy  1982    Allergies  Allergen Reactions  . Ramipril     REACTION: chest congestion & cough. No angioedema    Review of systems negative except as noted in HPI / PMHx or noted below:  Review of Systems  Constitutional: Negative.   HENT: Negative.   Eyes: Negative.   Respiratory: Negative.   Cardiovascular: Negative.   Gastrointestinal: Negative.   Genitourinary: Negative.   Musculoskeletal: Negative.   Skin: Negative.   Neurological: Negative.     Endo/Heme/Allergies: Negative.   Psychiatric/Behavioral: Negative.      Objective:   Filed Vitals:   10/23/15 1734  BP: 130/80  Pulse: 88  Resp: 16          Physical Exam  Constitutional: He is well-developed, well-nourished, and in no distress.  HENT:  Head: Normocephalic.  Right Ear: Tympanic membrane, external ear and ear canal normal.  Left Ear: Tympanic membrane, external ear and ear canal normal.  Nose: Nose normal. No mucosal edema or rhinorrhea.  Mouth/Throat: Uvula is midline, oropharynx is clear and moist and mucous membranes are normal. No oropharyngeal exudate.  Eyes: Conjunctivae are normal.  Neck: Trachea normal. No tracheal tenderness present. No tracheal deviation present. No thyromegaly present.  Cardiovascular: Normal rate, regular rhythm, S1 normal, S2 normal and normal heart sounds.   No murmur heard. Pulmonary/Chest: No stridor. No respiratory distress. He has wheezes (Bilateral expiratory wheezes). He has no rales.  Musculoskeletal: He exhibits no edema.  Lymphadenopathy:       Head (right side): No tonsillar adenopathy present.       Head (left side): No tonsillar adenopathy present.    He has no cervical adenopathy.  Neurological: He is alert. Gait normal.  Skin: No rash noted. He is not diaphoretic. No erythema. Nails show no clubbing.  Psychiatric: Mood and affect normal.    Diagnostics:    Spirometry was performed and demonstrated an FEV1 of 2.09 at 74 % of predicted.  The patient had an Asthma Control Test with the following results: ACT Total Score: 16.    Assessment and Plan:   1. Acute exacerbation of COPD with asthma ([place])   2. Allergic rhinoconjunctivitis   3. History of chronic sinusitis   4. LPRD (laryngopharyngeal reflux disease)     1. Consider starting immunotherapy against cat and dog and dust mite  2. Consider Xolair or Nucala administration  2. Treat inflammation:   A. Continue Breo 200 one inhalation one time per  day  B. Continue Incruze One inhalation one time per day  C. Continue Nasonex 1 spray each nostril one time per day  D. Continue montelukast 10 mg one tablet one time per day  E. slow taper of prednisone: 20 mg a day for 7 days then 15 mg a day for 7 days then 10 mg a day for 7 days then 5 mg  a day  for 7 days  3. Treat reflux:   A. Dexilant 60 mg in the a.m.  B. Pepcid 20 mg tablet 2 tablets in the p.m.  C. Continue off all caffeine and chocolate  4. If needed:   A. ProAir HFA 2 puffs every 4-6 hours  B. nasal saline washes  C. OTC antihistamine  D. Mucinex DM 2 tablets twice a day  5. Return to clinic in 4 weeks or earlier if problem  I'm going to have Darrold use a collection of medical therapy directed against inflammation of his airway including a relatively low dose but prolonged dose of prednisone taper as noted above. He will also continue to use aggressive therapy directed against reflux. I think he may be a candidate for a biological agent to treat his respiratory tract inflammation and as well immunotherapy and I've given him literature on all these therapies during today's visit. We'll make a decision on how to proceed pending his response when he returns to this clinic in 4 weeks.  Allena Katz, MD [place]

## 2015-10-23 NOTE — Patient Instructions (Addendum)
  1. Consider starting immunotherapy against cat and dog and dust mite  2. Consider Xolair or Nucala administration  2. Treat inflammation:   A. Continue Breo 200 one inhalation one time per day  B. Continue Incruze One inhalation one time per day  C. Continue Nasonex 1 spray each nostril one time per day  D. Continue montelukast 10 mg one tablet one time per day  E. slow taper of prednisone: 20 mg a day for 7 days then 15 mg a day for 7 days  then 10 mg a day for 7 days then 5 g a day for 7 days  3. Treat reflux:   A. Dexilant 60 mg in the a.m.  B. Pepcid 20 mg tablet 2 tablets in the p.m.  C. Continue off all caffeine and chocolate  4. If needed:   A. ProAir HFA 2 puffs every 4-6 hours  B. nasal saline washes  C. OTC antihistamine  D. Mucinex DM 2 tablets twice a day  5. Return to clinic in 4 weeks or earlier if problem

## 2015-10-24 DIAGNOSIS — H401132 Primary open-angle glaucoma, bilateral, moderate stage: Secondary | ICD-10-CM | POA: Diagnosis not present

## 2015-10-24 DIAGNOSIS — H353111 Nonexudative age-related macular degeneration, right eye, early dry stage: Secondary | ICD-10-CM | POA: Diagnosis not present

## 2015-10-24 DIAGNOSIS — H3589 Other specified retinal disorders: Secondary | ICD-10-CM | POA: Diagnosis not present

## 2015-11-20 ENCOUNTER — Ambulatory Visit (INDEPENDENT_AMBULATORY_CARE_PROVIDER_SITE_OTHER): Payer: Medicare Other | Admitting: Allergy and Immunology

## 2015-11-20 ENCOUNTER — Encounter: Payer: Self-pay | Admitting: Allergy and Immunology

## 2015-11-20 VITALS — BP 126/72 | HR 80 | Resp 20

## 2015-11-20 DIAGNOSIS — J387 Other diseases of larynx: Secondary | ICD-10-CM

## 2015-11-20 DIAGNOSIS — Z8709 Personal history of other diseases of the respiratory system: Secondary | ICD-10-CM

## 2015-11-20 DIAGNOSIS — J441 Chronic obstructive pulmonary disease with (acute) exacerbation: Secondary | ICD-10-CM

## 2015-11-20 DIAGNOSIS — H1010 Acute atopic conjunctivitis, unspecified eye: Secondary | ICD-10-CM

## 2015-11-20 DIAGNOSIS — J309 Allergic rhinitis, unspecified: Secondary | ICD-10-CM | POA: Diagnosis not present

## 2015-11-20 DIAGNOSIS — K219 Gastro-esophageal reflux disease without esophagitis: Secondary | ICD-10-CM

## 2015-11-20 DIAGNOSIS — J45901 Unspecified asthma with (acute) exacerbation: Secondary | ICD-10-CM | POA: Diagnosis not present

## 2015-11-20 MED ORDER — IPRATROPIUM-ALBUTEROL 0.5-2.5 (3) MG/3ML IN SOLN: 3.0000 mL | RESPIRATORY_TRACT | Status: DC | PRN

## 2015-11-20 NOTE — Patient Instructions (Signed)
  1. Stop Incruze  2. Submit for Xolair administration.   2. Treat inflammation:   A. Continue Breo 200 one inhalation one time per day  B. Continue Nasonex 1 spray each nostril one time per day  C. Continue montelukast 10 mg one tablet one time per day    3. Treat reflux:   A. Dexilant 60 mg in the a.m.  B. Pepcid 20 mg tablet 2 tablets in the p.m.  C. Continue off all caffeine and chocolate  4. If needed:   A. ProAir HFA 2 puffs every 4-6 hours  B. nasal saline washes  C. OTC antihistamine  D. Mucinex DM 2 tablets twice a day  E. Duoneb nebulized every 4-6 hours if needed.  5. Return to clinic in 4 weeks or earlier if problem

## 2015-11-20 NOTE — Progress Notes (Signed)
Follow-up Note  Referring Provider: Janith Lima, MD Primary Provider: Scarlette Calico, MD Date of Office Visit: 11/20/2015  Subjective:   Edwin [name] (DOB: 1941-02-07) is a 75 y.o. male who returns to the [facility] on 11/20/2015 in re-evaluation of the following:  HPI: Shakeem returns to this clinic in reevaluation of his COPD with component of asthma, allergic rhinitis and LPR. Using a prolonged tapering course of steroids as well as continuing on his anti-inflammatory medications for his respiratory tract in treating reflux quite aggressively he's really not that much better. He does not have any ugly sputum production at this point time nor does he have any issues with his upper airways but he still continues to cough and congested chest. He has been very good about using his Breo, and Incruze and Nasonex and montelukast as well as treating his reflux with proton pump inhibitor and an H2 receptor blocker. After reading through all the literature we gave him last time he is decided to pursue omalizumab administration among all of his other choices.    Medication List           albuterol 108 (90 Base) MCG/ACT inhaler  Commonly known as:  PROVENTIL HFA  Inhale 2 puffs into the lungs every 4 (four) hours as needed for wheezing or shortness of breath. 1-2 puffs every 4 hrs as needed.     allopurinol 300 MG tablet  Commonly known as:  ZYLOPRIM  TAKE 1 TABLET (300 MG TOTAL) BY MOUTH DAILY.     bimatoprost 0.03 % ophthalmic solution  Commonly known as:  LUMIGAN  Place 1 drop into both eyes at bedtime.     brimonidine 0.1 % Soln  Commonly known as:  ALPHAGAN P  1 drop 2 (two) times daily. Both eyes     dexlansoprazole 60 MG capsule  Commonly known as:  DEXILANT  Take 1 capsule (60 mg total) by mouth daily.     dextromethorphan-guaiFENesin 30-600 MG 12hr tablet  Commonly known as:  MUCINEX DM  Take 1 tablet by mouth daily.     famotidine 20 MG tablet    Commonly known as:  PEPCID  One at bedtime     FLUoxetine 10 MG capsule  Commonly known as:  PROZAC  TAKE ONE CAPSULE BY MOUTH ONE TIME DAILY.     fluticasone furoate-vilanterol 200-25 MCG/INH Aepb  Commonly known as:  BREO ELLIPTA  Inhale 1 puff into the lungs daily.     FLUTTER Devi  Take as directed two to three times daily as needed     GLUCOSAMINE 1500 COMPLEX PO  Take 2 tablets by mouth daily.     ibuprofen 200 MG tablet  Commonly known as:  ADVIL,MOTRIN  Take 200 mg by mouth every 6 (six) hours as needed.     montelukast 10 MG tablet  Commonly known as:  SINGULAIR  Take 1 tablet (10 mg total) by mouth at bedtime.     NASONEX 50 MCG/ACT nasal spray  Generic drug:  mometasone  place 2 sprays into each nostril daily as needed     olmesartan-hydrochlorothiazide 40-12.5 MG tablet  Commonly known as:  BENICAR HCT  One half daily     psyllium 58.6 % powder  Commonly known as:  METAMUCIL  Take 1 packet by mouth daily.     SINUS RINSE KIT NA  daily.     umeclidinium bromide 62.5 MCG/INH Aepb  Commonly known as:  INCRUSE ELLIPTA  Inhale 1 puff into the lungs daily.        Past Medical History  Diagnosis Date  . Glaucoma   . Gout   . Hyperlipidemia     NMR lipoprofile 2005: LDL 113(1416/825), HLD 37, TG 190.  [name] Kitchen Hypertension   . COPD (chronic obstructive pulmonary disease) (HCC)     reactive airway disease  . Skin cancer     hx of basal cell x 1, 2003, Dr.Patseavours  . Hx of colonic polyps     Dr Earlean Shawl    Past Surgical History  Procedure Laterality Date  . Tear duct surgery  1998    for excess tearing, skin graft L foot @ age 81 1/2  . Cataract extraction      bilat, implants  . Cholecystectomy  1995  . Polypectomy  2003     neg 2006, Dr.Medoff  . Lumbar laminectomy  1982    Allergies  Allergen Reactions  . Ramipril     REACTION: chest congestion & cough. No angioedema    Review of systems negative except as noted in HPI / PMHx or noted  below:  Review of Systems  Constitutional: Negative.   HENT: Negative.   Eyes: Negative.   Respiratory: Negative.   Cardiovascular: Negative.   Gastrointestinal: Negative.   Genitourinary: Negative.   Musculoskeletal: Negative.   Skin: Negative.   Neurological: Negative.   Endo/Heme/Allergies: Negative.   Psychiatric/Behavioral: Negative.      Objective:   Filed Vitals:   11/20/15 1630  BP: 126/72  Pulse: 80  Resp: 20          Physical Exam  Constitutional: He is well-developed, well-nourished, and in no distress.  HENT:  Head: Normocephalic.  Right Ear: Tympanic membrane, external ear and ear canal normal.  Left Ear: Tympanic membrane, external ear and ear canal normal.  Nose: Nose normal. No mucosal edema or rhinorrhea.  Mouth/Throat: Uvula is midline, oropharynx is clear and moist and mucous membranes are normal. No oropharyngeal exudate.  Eyes: Conjunctivae are normal.  Neck: Trachea normal. No tracheal tenderness present. No tracheal deviation present. No thyromegaly present.  Cardiovascular: Normal rate, regular rhythm, S1 normal, S2 normal and normal heart sounds.   No murmur heard. Pulmonary/Chest: No stridor. No respiratory distress. He has wheezes (End expiratory wheezing). He has no rales.  Musculoskeletal: He exhibits no edema.  Lymphadenopathy:       Head (right side): No tonsillar adenopathy present.       Head (left side): No tonsillar adenopathy present.    He has no cervical adenopathy.  Neurological: He is alert. Gait normal.  Skin: No rash noted. He is not diaphoretic. No erythema. Nails show no clubbing.  Psychiatric: Mood and affect normal.    Diagnostics: Results of an IgE level obtained on 04/17/2015 identified a level of 588 KU/l   Spirometry was performed and demonstrated an FEV1 of 1.77 at 60 % of predicted.  The patient had an Asthma Control Test with the following results: ACT Total Score: 7.    Assessment and Plan:   1. Acute  exacerbation of COPD with asthma ([place])   2. Allergic rhinoconjunctivitis   3. History of chronic sinusitis   4. LPRD (laryngopharyngeal reflux disease)     1. Stop Incruze  2. Submit for Xolair administration.   2. Treat inflammation:   A. Continue Breo 200 one inhalation one time per day  B. Continue Nasonex 1 spray each nostril one time per day  C. Continue  montelukast 10 mg one tablet one time per day    3. Treat reflux:   A. Dexilant 60 mg in the a.m.  B. Pepcid 20 mg tablet 2 tablets in the p.m.  C. Continue off all caffeine and chocolate  4. If needed:   A. ProAir HFA 2 puffs every 4-6 hours  B. nasal saline washes  C. OTC antihistamine  D. Mucinex DM 2 tablets twice a day  E. Duoneb nebulized every 4-6 hours if needed.  5. Return to clinic in 4 weeks or earlier if problem  I will have Horton start omalizumab administration and I'm going to have him use DuoNeb as needed. While utilizing DuoNeb we will remove his other anticholinergic agent. He will continue to use other anti-inflammatory medications for his respiratory tract as stated above and we'll also continue to use aggressive therapy directed against reflux. I will see him back in this clinic in 4 weeks or earlier if there is a problem.  Allena Katz, MD [place]

## 2015-11-28 ENCOUNTER — Ambulatory Visit: Payer: Medicare Other | Admitting: Allergy and Immunology

## 2015-11-30 ENCOUNTER — Other Ambulatory Visit: Payer: Self-pay | Admitting: Internal Medicine

## 2015-12-11 ENCOUNTER — Other Ambulatory Visit: Payer: Self-pay

## 2015-12-11 ENCOUNTER — Ambulatory Visit (INDEPENDENT_AMBULATORY_CARE_PROVIDER_SITE_OTHER): Payer: Medicare Other

## 2015-12-11 DIAGNOSIS — J4550 Severe persistent asthma, uncomplicated: Secondary | ICD-10-CM | POA: Diagnosis not present

## 2015-12-11 MED ORDER — OMALIZUMAB 150 MG ~~LOC~~ SOLR
375.0000 mg | SUBCUTANEOUS | Status: DC
  Administered 2015-12-11 – 2017-06-17 (×35): 375 mg via SUBCUTANEOUS

## 2015-12-11 MED ORDER — EPINEPHRINE 0.3 MG/0.3ML IJ SOAJ: 0.3000 mg | Freq: Once | INTRAMUSCULAR | Status: DC

## 2015-12-12 DIAGNOSIS — J4550 Severe persistent asthma, uncomplicated: Secondary | ICD-10-CM | POA: Diagnosis not present

## 2015-12-25 ENCOUNTER — Ambulatory Visit (INDEPENDENT_AMBULATORY_CARE_PROVIDER_SITE_OTHER): Payer: Medicare Other | Admitting: *Deleted

## 2015-12-25 DIAGNOSIS — J4550 Severe persistent asthma, uncomplicated: Secondary | ICD-10-CM | POA: Diagnosis not present

## 2015-12-26 DIAGNOSIS — J4550 Severe persistent asthma, uncomplicated: Secondary | ICD-10-CM | POA: Diagnosis not present

## 2016-01-01 ENCOUNTER — Other Ambulatory Visit: Payer: Self-pay | Admitting: Internal Medicine

## 2016-01-01 DIAGNOSIS — I10 Essential (primary) hypertension: Secondary | ICD-10-CM

## 2016-01-06 ENCOUNTER — Encounter (HOSPITAL_COMMUNITY): Payer: Self-pay | Admitting: Emergency Medicine

## 2016-01-06 ENCOUNTER — Emergency Department (HOSPITAL_COMMUNITY): Payer: Medicare Other

## 2016-01-06 ENCOUNTER — Inpatient Hospital Stay (HOSPITAL_COMMUNITY)
Admission: EM | Admit: 2016-01-06 | Discharge: 2016-01-08 | DRG: 190 | Disposition: A | Discharge status: Home / Self Care | Payer: Medicare Other | Attending: Internal Medicine | Admitting: Internal Medicine

## 2016-01-06 DIAGNOSIS — J440 Chronic obstructive pulmonary disease with acute lower respiratory infection: Principal | ICD-10-CM | POA: Diagnosis present

## 2016-01-06 DIAGNOSIS — M109 Gout, unspecified: Secondary | ICD-10-CM | POA: Diagnosis not present

## 2016-01-06 DIAGNOSIS — Z8249 Family history of ischemic heart disease and other diseases of the circulatory system: Secondary | ICD-10-CM

## 2016-01-06 DIAGNOSIS — R079 Chest pain, unspecified: Secondary | ICD-10-CM

## 2016-01-06 DIAGNOSIS — J189 Pneumonia, unspecified organism: Secondary | ICD-10-CM

## 2016-01-06 DIAGNOSIS — E785 Hyperlipidemia, unspecified: Secondary | ICD-10-CM | POA: Diagnosis not present

## 2016-01-06 DIAGNOSIS — I10 Essential (primary) hypertension: Secondary | ICD-10-CM | POA: Diagnosis present

## 2016-01-06 DIAGNOSIS — Z825 Family history of asthma and other chronic lower respiratory diseases: Secondary | ICD-10-CM

## 2016-01-06 DIAGNOSIS — H409 Unspecified glaucoma: Secondary | ICD-10-CM | POA: Diagnosis present

## 2016-01-06 DIAGNOSIS — Z7982 Long term (current) use of aspirin: Secondary | ICD-10-CM

## 2016-01-06 DIAGNOSIS — Z79899 Other long term (current) drug therapy: Secondary | ICD-10-CM

## 2016-01-06 DIAGNOSIS — Z7951 Long term (current) use of inhaled steroids: Secondary | ICD-10-CM

## 2016-01-06 DIAGNOSIS — J441 Chronic obstructive pulmonary disease with (acute) exacerbation: Secondary | ICD-10-CM | POA: Diagnosis not present

## 2016-01-06 DIAGNOSIS — R071 Chest pain on breathing: Secondary | ICD-10-CM | POA: Diagnosis not present

## 2016-01-06 DIAGNOSIS — Z87891 Personal history of nicotine dependence: Secondary | ICD-10-CM

## 2016-01-06 DIAGNOSIS — Z9049 Acquired absence of other specified parts of digestive tract: Secondary | ICD-10-CM | POA: Diagnosis not present

## 2016-01-06 DIAGNOSIS — N179 Acute kidney failure, unspecified: Secondary | ICD-10-CM | POA: Diagnosis present

## 2016-01-06 DIAGNOSIS — K219 Gastro-esophageal reflux disease without esophagitis: Secondary | ICD-10-CM | POA: Diagnosis not present

## 2016-01-06 DIAGNOSIS — R0600 Dyspnea, unspecified: Secondary | ICD-10-CM | POA: Diagnosis not present

## 2016-01-06 DIAGNOSIS — Z86010 Personal history of colonic polyps: Secondary | ICD-10-CM | POA: Diagnosis not present

## 2016-01-06 DIAGNOSIS — Z888 Allergy status to other drugs, medicaments and biological substances status: Secondary | ICD-10-CM

## 2016-01-06 DIAGNOSIS — R0602 Shortness of breath: Secondary | ICD-10-CM | POA: Diagnosis not present

## 2016-01-06 HISTORY — DX: Unspecified asthma, uncomplicated: J45.909

## 2016-01-06 LAB — BASIC METABOLIC PANEL
Anion gap: 11 (ref 5–15)
BUN: 20 mg/dL (ref 6–20)
CO2: 24 mmol/L (ref 22–32)
CREATININE: 1.48 mg/dL — AB (ref 0.61–1.24)
Calcium: 9.7 mg/dL (ref 8.9–10.3)
Chloride: 102 mmol/L (ref 101–111)
GFR calc Af Amer: 52 mL/min — ABNORMAL LOW (ref >60)
GFR, EST NON AFRICAN AMERICAN: 45 mL/min — AB (ref >60)
Glucose, Bld: 128 mg/dL — ABNORMAL HIGH (ref 65–99)
POTASSIUM: 3.9 mmol/L (ref 3.5–5.1)
SODIUM: 137 mmol/L (ref 135–145)

## 2016-01-06 LAB — I-STAT TROPONIN, ED: Troponin i, poc: 0 ng/mL (ref 0.00–0.08)

## 2016-01-06 LAB — CBC
HCT: 51.1 % (ref 39.0–52.0)
Hemoglobin: 16.8 g/dL (ref 13.0–17.0)
MCH: 33.7 pg (ref 26.0–34.0)
MCHC: 32.9 g/dL (ref 30.0–36.0)
MCV: 102.4 fL — ABNORMAL HIGH (ref 78.0–100.0)
PLATELETS: 183 10*3/uL (ref 150–400)
RBC: 4.99 MIL/uL (ref 4.22–5.81)
RDW: 14.1 % (ref 11.5–15.5)
WBC: 15.6 10*3/uL — AB (ref 4.0–10.5)

## 2016-01-06 LAB — TROPONIN I: Troponin I: <0.03 ng/mL (ref <0.03)

## 2016-01-06 LAB — BRAIN NATRIURETIC PEPTIDE: B Natriuretic Peptide: 18.9 pg/mL (ref 0.0–100.0)

## 2016-01-06 LAB — D-DIMER, QUANTITATIVE (NOT AT ARMC): D DIMER QUANT: 0.36 ug{FEU}/mL (ref 0.00–0.50)

## 2016-01-06 LAB — STREP PNEUMONIAE URINARY ANTIGEN: STREP PNEUMO URINARY ANTIGEN: NEGATIVE

## 2016-01-06 MED ORDER — ASPIRIN 81 MG PO CHEW
324.0000 mg | CHEWABLE_TABLET | Freq: Once | ORAL | Status: AC
  Administered 2016-01-06: 324 mg via ORAL
  Filled 2016-01-06: qty 4

## 2016-01-06 MED ORDER — DEXTROSE 5 % IV SOLN: 500.0000 mg | Freq: Once | INTRAVENOUS | Status: DC

## 2016-01-06 MED ORDER — PREDNISONE 20 MG PO TABS
40.0000 mg | ORAL_TABLET | Freq: Every day | ORAL | Status: DC
  Administered 2016-01-07 – 2016-01-08 (×2): 40 mg via ORAL
  Filled 2016-01-06 (×2): qty 2

## 2016-01-06 MED ORDER — ENOXAPARIN SODIUM 40 MG/0.4ML ~~LOC~~ SOLN
40.0000 mg | SUBCUTANEOUS | Status: DC
  Administered 2016-01-06 – 2016-01-07 (×2): 40 mg via SUBCUTANEOUS
  Filled 2016-01-06 (×2): qty 0.4

## 2016-01-06 MED ORDER — HYDRALAZINE HCL 20 MG/ML IJ SOLN: 20.0000 mg | Freq: Four times a day (QID) | INTRAMUSCULAR | Status: DC | PRN

## 2016-01-06 MED ORDER — ACETAMINOPHEN 500 MG PO TABS
1000.0000 mg | ORAL_TABLET | Freq: Once | ORAL | Status: AC
  Administered 2016-01-06: 1000 mg via ORAL
  Filled 2016-01-06: qty 2

## 2016-01-06 MED ORDER — ASPIRIN EC 81 MG PO TBEC
81.0000 mg | DELAYED_RELEASE_TABLET | Freq: Every day | ORAL | Status: DC
  Administered 2016-01-07 – 2016-01-08 (×2): 81 mg via ORAL
  Filled 2016-01-06 (×2): qty 1

## 2016-01-06 MED ORDER — MONTELUKAST SODIUM 10 MG PO TABS
10.0000 mg | ORAL_TABLET | Freq: Every day | ORAL | Status: DC
  Administered 2016-01-07 (×2): 10 mg via ORAL
  Filled 2016-01-06 (×2): qty 1

## 2016-01-06 MED ORDER — PANTOPRAZOLE SODIUM 40 MG PO TBEC
40.0000 mg | DELAYED_RELEASE_TABLET | Freq: Every day | ORAL | Status: DC
  Administered 2016-01-07 – 2016-01-08 (×2): 40 mg via ORAL
  Filled 2016-01-06 (×2): qty 1

## 2016-01-06 MED ORDER — FAMOTIDINE 20 MG PO TABS: 20.0000 mg | ORAL_TABLET | Freq: Every day | ORAL | Status: DC

## 2016-01-06 MED ORDER — AZITHROMYCIN 500 MG IV SOLR
500.0000 mg | Freq: Once | INTRAVENOUS | Status: AC
  Administered 2016-01-06: 500 mg via INTRAVENOUS
  Filled 2016-01-06: qty 500

## 2016-01-06 MED ORDER — ALLOPURINOL 300 MG PO TABS
300.0000 mg | ORAL_TABLET | Freq: Every day | ORAL | Status: DC
  Administered 2016-01-07 – 2016-01-08 (×2): 300 mg via ORAL
  Filled 2016-01-06 (×2): qty 1

## 2016-01-06 MED ORDER — ZOLPIDEM TARTRATE 5 MG PO TABS
5.0000 mg | ORAL_TABLET | Freq: Every evening | ORAL | Status: DC | PRN
  Administered 2016-01-06: 5 mg via ORAL
  Filled 2016-01-06 (×2): qty 1

## 2016-01-06 MED ORDER — AZITHROMYCIN 500 MG PO TABS
500.0000 mg | ORAL_TABLET | ORAL | Status: DC
  Administered 2016-01-07: 500 mg via ORAL
  Filled 2016-01-06: qty 1

## 2016-01-06 MED ORDER — FLUOXETINE HCL 20 MG PO CAPS
20.0000 mg | ORAL_CAPSULE | Freq: Every day | ORAL | Status: DC
  Administered 2016-01-07 – 2016-01-08 (×2): 20 mg via ORAL
  Filled 2016-01-06 (×2): qty 1

## 2016-01-06 MED ORDER — IPRATROPIUM-ALBUTEROL 0.5-2.5 (3) MG/3ML IN SOLN: 3.0000 mL | RESPIRATORY_TRACT | Status: DC | PRN

## 2016-01-06 MED ORDER — SODIUM CHLORIDE 0.9 % IV SOLN
INTRAVENOUS | Status: DC
  Administered 2016-01-06: 21:00:00 via INTRAVENOUS

## 2016-01-06 MED ORDER — IPRATROPIUM-ALBUTEROL 0.5-2.5 (3) MG/3ML IN SOLN
3.0000 mL | Freq: Once | RESPIRATORY_TRACT | Status: AC
  Administered 2016-01-06: 3 mL via RESPIRATORY_TRACT
  Filled 2016-01-06: qty 3

## 2016-01-06 MED ORDER — PSYLLIUM 95 % PO PACK
1.0000 | PACK | Freq: Every day | ORAL | Status: DC
  Administered 2016-01-07: 1 via ORAL
  Filled 2016-01-06 (×3): qty 1

## 2016-01-06 MED ORDER — ACETAMINOPHEN 325 MG PO TABS: 650.0000 mg | ORAL_TABLET | Freq: Four times a day (QID) | ORAL | Status: DC | PRN

## 2016-01-06 MED ORDER — DEXTROSE 5 % IV SOLN
1.0000 g | INTRAVENOUS | Status: DC
  Administered 2016-01-07: 1 g via INTRAVENOUS
  Filled 2016-01-06 (×2): qty 10

## 2016-01-06 MED ORDER — ONDANSETRON HCL 4 MG/2ML IJ SOLN
4.0000 mg | Freq: Once | INTRAMUSCULAR | Status: AC
  Administered 2016-01-06: 4 mg via INTRAVENOUS
  Filled 2016-01-06: qty 2

## 2016-01-06 MED ORDER — BRIMONIDINE TARTRATE 0.15 % OP SOLN
1.0000 [drp] | Freq: Two times a day (BID) | OPHTHALMIC | Status: DC
  Administered 2016-01-07: 1 [drp] via OPHTHALMIC
  Filled 2016-01-06: qty 5

## 2016-01-06 MED ORDER — DEXTROSE 5 % IV SOLN
1.0000 g | Freq: Once | INTRAVENOUS | Status: AC
  Administered 2016-01-06: 1 g via INTRAVENOUS
  Filled 2016-01-06: qty 10

## 2016-01-06 MED ORDER — IPRATROPIUM-ALBUTEROL 0.5-2.5 (3) MG/3ML IN SOLN
3.0000 mL | Freq: Four times a day (QID) | RESPIRATORY_TRACT | Status: DC
  Administered 2016-01-06 – 2016-01-07 (×3): 3 mL via RESPIRATORY_TRACT
  Filled 2016-01-06 (×3): qty 3

## 2016-01-06 MED ORDER — LATANOPROST 0.005 % OP SOLN
1.0000 [drp] | Freq: Every day | OPHTHALMIC | Status: DC
  Filled 2016-01-06: qty 2.5

## 2016-01-06 MED ORDER — METHYLPREDNISOLONE SODIUM SUCC 125 MG IJ SOLR
125.0000 mg | Freq: Once | INTRAMUSCULAR | Status: AC
  Administered 2016-01-06: 125 mg via INTRAVENOUS
  Filled 2016-01-06: qty 2

## 2016-01-06 MED ORDER — DM-GUAIFENESIN ER 30-600 MG PO TB12
1.0000 | ORAL_TABLET | Freq: Every day | ORAL | Status: DC | PRN
  Administered 2016-01-07: 1 via ORAL
  Filled 2016-01-06: qty 1

## 2016-01-06 MED ORDER — DEXTROSE 5 % IV SOLN: 1.0000 g | Freq: Once | INTRAVENOUS | Status: DC

## 2016-01-06 MED ORDER — AMLODIPINE BESYLATE 5 MG PO TABS
5.0000 mg | ORAL_TABLET | Freq: Every day | ORAL | Status: DC
  Administered 2016-01-07 – 2016-01-08 (×2): 5 mg via ORAL
  Filled 2016-01-06 (×2): qty 1

## 2016-01-06 MED ORDER — IOPAMIDOL (ISOVUE-370) INJECTION 76%
100.0000 mL | Freq: Once | INTRAVENOUS | Status: AC | PRN
  Administered 2016-01-06: 100 mL via INTRAVENOUS

## 2016-01-06 MED ORDER — FLUTICASONE FUROATE-VILANTEROL 200-25 MCG/INH IN AEPB
1.0000 | INHALATION_SPRAY | Freq: Every day | RESPIRATORY_TRACT | Status: DC
  Administered 2016-01-07 – 2016-01-08 (×2): 1 via RESPIRATORY_TRACT
  Filled 2016-01-06: qty 28

## 2016-01-06 NOTE — ED Triage Notes (Signed)
Pt c/o left sided neck pain and shortness of breath onset today about 1 1/2 hours PTA. Pt took 81 mg ASA and 2 tylenol PTA.

## 2016-01-06 NOTE — Progress Notes (Signed)
Admission note:  Arrival Method: Pt arrived on stretcher from ED Mental Orientation: Alert and oriented x 4 Telemetry: Telemetry box 6e01 applied. CCMT notified.  Assessment: Completed, see flowsheets Skin: Dry and intact. Assessed with Bryn Gulling, RN IV: Left AC. Site is clean, dry and intact Pain: Pt states pain is 7/10 in his head. Medication administered, see MAR.  Tubes: N/A Safety Measures: Bed in lowest position, non-slip socks placed, call light within reach Fall Prevention Safety Plan: Reviewed with patient Admission Screening: Completed  6700 Orientation: Patient has been oriented to the unit, staff and to the room. Orders have been reviewed and implemented. Call light is within reach, will continue to monitor the patient closely.   Shelbie Hutching, RN, BSN

## 2016-01-06 NOTE — ED Notes (Signed)
Patient back in room. In no signs of distress.

## 2016-01-06 NOTE — H&P (Signed)
History and Physical    Edwin [name] [id number] DOB: February 06, 1941 DOA: 01/06/2016  PCP: Scarlette Calico, MD   Patient coming from: Home  Chief Complaint: Chest pain and shortness of breath  HPI: Edwin [name] is a 75 y.o. gentleman with a history of COPD (he is not on home oxygen; he has never been admitted to the hospital for COPD exacerbation), HTN, gout and glaucoma who feels that he was in his baseline state of heath until this afternoon when he developed the acute onset of substernal chest pressure associated with shortness of breath.  He was outside spraying weed killer when the symptoms started; this was not a new exposure for him.  Symptoms were also associated with nausea and pain in his left neck.  He had pain with deep inspiration.  He has a wet cough.  Subjective fever.  No known history of CAD.  He took a baby aspirin and two Tylenol, without relief of his symptoms.  After about 40 minutes, his wife drove him to the ED for further evaluation.  ED Course: EKG showed sinus tachycardia.  D-Dimer was negative, but CTA of the chest was still pursued due to concerning symptoms.  CTA chest negative for acute PE, but bilateral pulmonary infiltrates identified, suggestive of pneumonia.  The patient has received IV Rocephin and azithromycin, IV solumedrol, and duoneb treatment.  He has received Zofran for nausea.  He is still have mild chest discomfort and nausea.  ASA 324mg , pepcid, and acetaminophen ordered (the patient declined IV morphine).  He is being admitted to telemetry.  Review of Systems: As per HPI otherwise 10 point review of systems negative.    Past Medical History:  Diagnosis Date  . Asthma   . COPD (chronic obstructive pulmonary disease) (HCC)    reactive airway disease  . Glaucoma   . Gout   . Hx of colonic polyps    Dr Earlean Shawl  . Hyperlipidemia    NMR lipoprofile 2005: LDL 113(1416/825), HLD 37, TG 190.  [name] Kitchen Hypertension     Past Surgical History:  Procedure  Laterality Date  . CATARACT EXTRACTION     bilat, implants  . CHOLECYSTECTOMY  1995  . LUMBAR LAMINECTOMY  1982  . POLYPECTOMY  2003    neg 2006, Dr.Medoff  . tear duct surgery  1998   for excess tearing, skin graft L foot @ age 47 1/2     reports that he quit smoking about 32 years ago. His smoking use included Cigarettes. He has a 50.00 pack-year smoking history. He has never used smokeless tobacco. He reports that he does not drink alcohol or use drugs.  He is married.  He has two adult children.  He owns his own business.  Allergies  Allergen Reactions  . Ramipril     REACTION: chest congestion & cough. No angioedema    Family History  Problem Relation Age of Onset  . Heart attack Father 19    due to asthma flare  . Asthma Father   . Emphysema Father   . Hypertension Mother   . Cancer Mother      ? primary  . Asthma Son     x2     Prior to Admission medications   Medication Sig Start Date End Date Taking? Authorizing Provider  acetaminophen (TYLENOL) 325 MG tablet Take 650 mg by mouth every 6 (six) hours as needed for mild pain.   Yes Historical Provider, MD  albuterol (PROVENTIL HFA) 108 (90 BASE)  MCG/ACT inhaler Inhale 2 puffs into the lungs every 4 (four) hours as needed for wheezing or shortness of breath. 1-2 puffs every 4 hrs as needed. 04/26/12  Yes Rigoberto Noel, MD  allopurinol (ZYLOPRIM) 300 MG tablet TAKE 1 TABLET (300 MG TOTAL) BY MOUTH DAILY. 08/06/15  Yes Janith Lima, MD  aspirin EC 81 MG tablet Take 81 mg by mouth daily.   Yes Historical Provider, MD  bimatoprost (LUMIGAN) 0.03 % ophthalmic solution Place 1 drop into both eyes at bedtime.     Yes Historical Provider, MD  brimonidine (ALPHAGAN P) 0.1 % SOLN 1 drop 2 (two) times daily. Both eyes    Yes Historical Provider, MD  DEXILANT 60 MG capsule TAKE 1 CAPSULE (60 MG TOTAL) BY MOUTH DAILY. 11/30/15  Yes Janith Lima, MD  dextromethorphan-guaiFENesin [facility] DM) 30-600 MG 12hr tablet Take 1 tablet by  mouth daily as needed for cough.    Yes Historical Provider, MD  EPINEPHrine 0.3 mg/0.3 mL IJ SOAJ injection Inject 0.3 mLs (0.3 mg total) into the muscle once. 12/11/15  Yes Jiles Prows, MD  famotidine (PEPCID) 20 MG tablet One at bedtime 03/06/15  Yes Tanda Rockers, MD  FLUoxetine (PROZAC) 10 MG capsule TAKE ONE CAPSULE BY MOUTH ONE TIME DAILY. 12/26/14  Yes Janith Lima, MD  fluticasone furoate-vilanterol (BREO ELLIPTA) 200-25 MCG/INH AEPB Inhale 1 puff into the lungs daily. 08/29/15  Yes Jiles Prows, MD  Glucosamine-Chondroit-Vit C-Mn (GLUCOSAMINE 1500 COMPLEX PO) Take 2 tablets by mouth every evening.    Yes Historical Provider, MD  ibuprofen (ADVIL,MOTRIN) 200 MG tablet Take 200 mg by mouth every 6 (six) hours as needed.   Yes Historical Provider, MD  ipratropium-albuterol (DUONEB) 0.5-2.5 (3) MG/3ML SOLN Take 3 mLs by nebulization every 4 (four) hours as needed. 11/20/15  Yes Jiles Prows, MD  NASONEX 50 MCG/ACT nasal spray place 2 sprays into each nostril daily as needed 03/20/14  Yes Elsie Stain, MD  olmesartan-hydrochlorothiazide (BENICAR HCT) 40-12.5 MG tablet TAKE 1/2 TABLET BY MOUTH DAILY 01/01/16  Yes Tanda Rockers, MD  omalizumab Arvid Right) 150 MG injection Inject 150 mg into the skin every 14 (fourteen) days.   Yes Historical Provider, MD  psyllium (METAMUCIL) 58.6 % powder Take 1 packet by mouth daily.     Yes Historical Provider, MD  umeclidinium bromide (INCRUSE ELLIPTA) 62.5 MCG/INH AEPB Inhale 1 puff into the lungs daily. Patient not taking: Reported on 01/06/2016 08/29/15   Jiles Prows, MD    Physical Exam: Vitals:   01/06/16 1500 01/06/16 1815  BP: 148/82 158/73  Pulse: 116 108  Resp: 24   Temp: 99.3 F (37.4 C)   SpO2: (!) 89% 91%      Constitutional: NAD, calm, comfortable but becomes short of breath quickly with sitting up in the bed. Vitals:   01/06/16 1500 01/06/16 1815  BP: 148/82 158/73  Pulse: 116 108  Resp: 24   Temp: 99.3 F (37.4 C)   SpO2:  (!) 89% 91%   Eyes: PERRL, lids and conjunctivae normal ENMT: Mucous membranes are moist. Posterior pharynx clear of any exudate or lesions. Normal dentition.  Neck: normal appearance, supple Respiratory: clear to auscultation bilaterally, no wheezing, no crackles. Mildly tachypneic with exertion but no accessory muscle use.  Cardiovascular: Tachycardic but regular.  Normal rate, regular rhythm, no murmurs / rubs / gallops. No extremity edema. 2+ pedal pulses. GI: abdomen is soft and compressible.  Protuberant but no tenderness.  Bowel sounds are present. Musculoskeletal:  No joint deformity in upper and lower extremities. Good ROM, no contractures. Normal muscle tone.  Skin: no rashes, warm and dry Neurologic: No focal deficits. Psychiatric: Normal judgment and insight. Alert and oriented x 3. Normal mood.     Labs on Admission: I have personally reviewed following labs and imaging studies  CBC:  Recent Labs Lab 01/06/16 1520  WBC 15.6*  HGB 16.8  HCT 51.1  MCV 102.4*  PLT [id number]   Basic Metabolic Panel:  Recent Labs Lab 01/06/16 1520  NA 137  K 3.9  CL 102  CO2 24  GLUCOSE 128*  BUN 20  CREATININE 1.48*  CALCIUM 9.7   GFR: CrCl cannot be calculated (Unknown ideal weight.).  Radiological Exams on Admission: Dg Chest 2 View  Result Date: 01/06/2016 CLINICAL DATA:  Shortness of breath and chest pain. EXAM: CHEST  2 VIEW COMPARISON:  February 15, 2015 FINDINGS: Minimal opacity in the left lung base may represent atelectasis versus early infiltrate. The heart, hila, mediastinum, lungs, and pleura are otherwise unremarkable. IMPRESSION: Minimal opacity in the left lung base may represent atelectasis versus early infiltrate. Recommend clinical correlation and follow-up as clinically warranted. Electronically Signed   By: Dorise Bullion III M.D   On: 01/06/2016 15:45   Ct Angio Chest Pe W Or Wo Contrast  Result Date: 01/06/2016 CLINICAL DATA:  Chest pain and shortness of  breath for 2 days EXAM: CT ANGIOGRAPHY CHEST WITH CONTRAST TECHNIQUE: Multidetector CT imaging of the chest was performed using the standard protocol during bolus administration of intravenous contrast. Multiplanar CT image reconstructions and MIPs were obtained to evaluate the vascular anatomy. CONTRAST:  80 mL Isovue 370. COMPARISON:  None. FINDINGS: Mediastinum/Lymph Nodes: No hilar or mediastinal adenopathy is noted. The thoracic inlet is within normal limits. Cardiovascular: The thoracic aorta demonstrates some calcifications without aneurysmal dilatation. Coronary calcifications are noted as well. The pulmonary artery shows a normal branching pattern without evidence of pulmonary emboli. Lungs/Pleura: Lungs are well aerated bilaterally. Patchy infiltrative changes are noted in the lingula as well as the left and right lower lobes. No sizable effusion or pneumothorax is noted. No parenchymal nodule is seen. Upper abdomen: Mild fatty infiltration of the liver is seen. The gallbladder has been surgically removed. Musculoskeletal: The osseous structures show no acute abnormality. Review of the MIP images confirms the above findings. IMPRESSION: Patchy bilateral infiltrates as described. No evidence of pulmonary emboli. Electronically Signed   By: Inez Catalina M.D.   On: 01/06/2016 17:37    EKG: Independently reviewed. Sinus tachycardia, heart rate 118.  Assessment/Plan Principal Problem:   CAP (community acquired pneumonia) Active Problems:   Essential hypertension   GERD   COPD exacerbation (No Name)   Chest pain   AKI (acute kidney injury) ([place])      CAP causing COPD exacerbation --Continue IV Rocephin and oral azithromycin --Blood cultures ordered in the ED --Urine legionella and streptococcal antigens --Sputum culture if the patient can produce a sample --Incentive spirometer use --Mucinex prn --Duonebs QID and prn --Prednisone 40mg  daily for now (no significant wheeze on exam) --Wean  supplemental oxygen as tolerated  Chest pain, pleurisy vs angina vs musculoskeletal --Telemetry monitoring --Serial troponin --echo in the AM --Analgesics as needed  HTN --Holding olmesartan/HCTZ for elevated creatinine --IV hydralazine prn plus low dose amlodipine for now  GERD --PPI  AKI --NS overnight, repeat BMP in the AM.  Holding ARB and diuretic for now.   DVT prophylaxis: Lovenox  Code Status: FULL Family Communication: Wife at bedside in the ED at time of admission Disposition Plan: Expect he will go home at discharge Consults called: NONE Admission status: Inpatient telemetry   TIME SPENT: 70 minutes   Eber Jones MD [facility] Pager [phone]  If 7PM-7AM, please contact night-coverage [email or web address] Password [email or web address]  01/06/2016, 7:09 PM  \

## 2016-01-06 NOTE — ED Notes (Signed)
Report given to Mickel Baas, RN at this time.  Receiving nurse denies having any further questions at this time.

## 2016-01-06 NOTE — ED Provider Notes (Signed)
[facility] DEPT Provider Note   CSN: [id number] Arrival date & time: 01/06/16  1451  First Provider Contact:  None       History   Chief Complaint Chief Complaint  Patient presents with  . Shortness of Breath  . Neck Pain  . Chills  . Nausea    HPI Edwin [name] is a 75 y.o. male.  Patient is a 75 year old male with past medical history of COPD and hypertension. He presents for evaluation of tightness in his chest and shortness of breath that started abruptly approximately 2 hours prior to arrival. He feels chilled but denies fever. He denies any leg pain or swelling. He denies any productive cough. He attempted his rescue inhaler with little relief.   The history is provided by the patient.  Shortness of Breath  This is a new problem. The average episode lasts 2 hours. The problem occurs continuously.The current episode started 1 to 2 hours ago. The problem has not changed since onset.Pertinent negatives include no fever, no cough, no sputum production and no leg swelling. He has tried beta-agonist inhalers for the symptoms. The treatment provided no relief. Associated medical issues include COPD.    Past Medical History:  Diagnosis Date  . COPD (chronic obstructive pulmonary disease) (HCC)    reactive airway disease  . Glaucoma   . Gout   . Hx of colonic polyps    Dr Earlean Shawl  . Hyperlipidemia    NMR lipoprofile 2005: LDL 113(1416/825), HLD 37, TG 190.  [name] Kitchen Hypertension   . Skin cancer    hx of basal cell x 1, 2003, Dr.Patseavours    Patient Active Problem List   Diagnosis Date Noted  . LPRD (laryngopharyngeal reflux disease) 06/06/2015  . COPD with asthma ([place]) 06/06/2015  . Allergic rhinoconjunctivitis 06/06/2015  . History of chronic sinusitis 06/06/2015  . Dyspnea 04/17/2015  . Cough 03/06/2015  . COPD GOLD 0 s/p smoking cessation in 1985 02/15/2015  . Routine general medical examination at a health care facility 10/03/2013  . Hyperglycemia 10/03/2013    . BPH (benign prostatic hypertrophy) 10/03/2013  . Hyperlipidemia with target LDL less than 130 05/13/2012  . Glaucoma 01/08/2011  . GERD 12/20/2008  . GOUT 01/20/2007  . Essential hypertension 01/20/2007    Past Surgical History:  Procedure Laterality Date  . CATARACT EXTRACTION     bilat, implants  . CHOLECYSTECTOMY  1995  . LUMBAR LAMINECTOMY  1982  . POLYPECTOMY  2003    neg 2006, Dr.Medoff  . tear duct surgery  1998   for excess tearing, skin graft L foot @ age 64 1/2       Home Medications    Prior to Admission medications   Medication Sig Start Date End Date Taking? Authorizing Provider  albuterol (PROVENTIL HFA) 108 (90 BASE) MCG/ACT inhaler Inhale 2 puffs into the lungs every 4 (four) hours as needed for wheezing or shortness of breath. 1-2 puffs every 4 hrs as needed. 04/26/12   Rigoberto Noel, MD  allopurinol (ZYLOPRIM) 300 MG tablet TAKE 1 TABLET (300 MG TOTAL) BY MOUTH DAILY. 08/06/15   Janith Lima, MD  bimatoprost (LUMIGAN) 0.03 % ophthalmic solution Place 1 drop into both eyes at bedtime.      Historical Provider, MD  brimonidine (ALPHAGAN P) 0.1 % SOLN 1 drop 2 (two) times daily. Both eyes     Historical Provider, MD  DEXILANT 60 MG capsule TAKE 1 CAPSULE (60 MG TOTAL) BY MOUTH DAILY. 11/30/15  Janith Lima, MD  dextromethorphan-guaiFENesin [facility] DM) 30-600 MG 12hr tablet Take 1 tablet by mouth daily.    Historical Provider, MD  EPINEPHrine 0.3 mg/0.3 mL IJ SOAJ injection Inject 0.3 mLs (0.3 mg total) into the muscle once. 12/11/15   Jiles Prows, MD  famotidine (PEPCID) 20 MG tablet One at bedtime 03/06/15   Tanda Rockers, MD  FLUoxetine (PROZAC) 10 MG capsule TAKE ONE CAPSULE BY MOUTH ONE TIME DAILY. 12/26/14   Janith Lima, MD  fluticasone furoate-vilanterol (BREO ELLIPTA) 200-25 MCG/INH AEPB Inhale 1 puff into the lungs daily. 08/29/15   Jiles Prows, MD  Glucosamine-Chondroit-Vit C-Mn (GLUCOSAMINE 1500 COMPLEX PO) Take 2 tablets by mouth daily.       Historical Provider, MD  Hypertonic Nasal Wash (SINUS RINSE KIT NA) daily.    Historical Provider, MD  ibuprofen (ADVIL,MOTRIN) 200 MG tablet Take 200 mg by mouth every 6 (six) hours as needed.    Historical Provider, MD  ipratropium-albuterol (DUONEB) 0.5-2.5 (3) MG/3ML SOLN Take 3 mLs by nebulization every 4 (four) hours as needed. 11/20/15   Jiles Prows, MD  montelukast (SINGULAIR) 10 MG tablet Take 1 tablet (10 mg total) by mouth at bedtime. 05/02/15   Jiles Prows, MD  NASONEX 50 MCG/ACT nasal spray place 2 sprays into each nostril daily as needed 03/20/14   Elsie Stain, MD  olmesartan-hydrochlorothiazide (BENICAR HCT) 40-12.5 MG tablet TAKE 1/2 TABLET BY MOUTH DAILY 01/01/16   Tanda Rockers, MD  psyllium (METAMUCIL) 58.6 % powder Take 1 packet by mouth daily.      Historical Provider, MD  Respiratory Therapy Supplies (FLUTTER) DEVI Take as directed two to three times daily as needed 02/19/11   Elsie Stain, MD  umeclidinium bromide (INCRUSE ELLIPTA) 62.5 MCG/INH AEPB Inhale 1 puff into the lungs daily. 08/29/15   Jiles Prows, MD    Family History Family History  Problem Relation Age of Onset  . Heart attack Father 69    due to asthma flare  . Asthma Father   . Emphysema Father   . Hypertension Mother   . Cancer Mother      ? primary  . Asthma Son     x2    Social History Social History  Substance Use Topics  . Smoking status: Former Smoker    Packs/day: 2.00    Years: 25.00    Types: Cigarettes    Quit date: 06/03/1983  . Smokeless tobacco: Never Used     Comment: up to 2 ppd  . Alcohol use No     Comment: socially; 6-8 / week     Allergies   Ramipril   Review of Systems Review of Systems  Constitutional: Negative for fever.  Respiratory: Positive for shortness of breath. Negative for cough and sputum production.   Cardiovascular: Negative for leg swelling.  All other systems reviewed and are negative.    Physical Exam Updated Vital Signs BP  148/82   Pulse 116   Temp 99.3 F (37.4 C)   Resp 24   SpO2 (!) 89%   Physical Exam  Constitutional: He is oriented to person, place, and time. He appears well-developed and well-nourished. No distress.  HENT:  Head: Normocephalic and atraumatic.  Mouth/Throat: Oropharynx is clear and moist.  Neck: Normal range of motion. Neck supple.  Cardiovascular: Regular rhythm.  Exam reveals no friction rub.   No murmur heard. Heart is regular, but tachycardic  Pulmonary/Chest: Effort normal and breath  sounds normal. No respiratory distress. He has no wheezes. He has no rales.  Abdominal: Soft. Bowel sounds are normal. He exhibits no distension. There is no tenderness.  Musculoskeletal: Normal range of motion. He exhibits no edema.  Neurological: He is alert and oriented to person, place, and time. Coordination normal.  Skin: Skin is warm and dry. He is not diaphoretic.  Nursing note and vitals reviewed.    ED Treatments / Results  Labs (all labs ordered are listed, but only abnormal results are displayed) Labs Reviewed  BASIC METABOLIC PANEL  CBC  D-DIMER, QUANTITATIVE (NOT AT [facility])  [place], ED    EKG  EKG Interpretation  Date/Time:  Sunday January 06 2016 14:58:56 EDT Ventricular Rate:  118 PR Interval:  138 QRS Duration: 80 QT Interval:  292 QTC Calculation: 409 R Axis:   42 Text Interpretation:  Sinus tachycardia Otherwise normal ECG Confirmed by Eligio Angert  MD, Dreshaun Stene ([id number]) on 01/06/2016 3:23:09 PM       Radiology No results found.  Procedures Procedures (including critical care time)  Medications Ordered in ED Medications - No data to display   Initial Impression / Assessment and Plan / ED Course  I have reviewed the triage vital signs and the nursing notes.  Pertinent labs & imaging results that were available during my care of the patient were reviewed by me and considered in my medical decision making (see chart for  details).  Clinical Course     Final Clinical Impressions(s) / ED Diagnoses   Final diagnoses:  None   Patient presents hypoxic with oxygen saturations of less than 90%. My initial concern was for pulmonary embolism, however this workup was negative. During the course of the workup, he was found to have by basilar infiltrates consistent with pneumonia. This will be treated with Rocephin and Zithromax. He will also be given a duoneb, steroids, and will be admitted to the hospitalist service under the care of Dr. Eulas Post.  New Prescriptions New Prescriptions   No medications on file     Veryl Speak, MD 01/06/16 3142

## 2016-01-07 ENCOUNTER — Other Ambulatory Visit (HOSPITAL_COMMUNITY): Payer: Medicare Other

## 2016-01-07 ENCOUNTER — Telehealth: Payer: Self-pay

## 2016-01-07 DIAGNOSIS — J189 Pneumonia, unspecified organism: Secondary | ICD-10-CM

## 2016-01-07 DIAGNOSIS — J441 Chronic obstructive pulmonary disease with (acute) exacerbation: Secondary | ICD-10-CM

## 2016-01-07 DIAGNOSIS — R071 Chest pain on breathing: Secondary | ICD-10-CM

## 2016-01-07 LAB — BASIC METABOLIC PANEL
Anion gap: 9 (ref 5–15)
BUN: 19 mg/dL (ref 6–20)
CHLORIDE: 101 mmol/L (ref 101–111)
CO2: 23 mmol/L (ref 22–32)
Calcium: 8.9 mg/dL (ref 8.9–10.3)
Creatinine, Ser: 1.59 mg/dL — ABNORMAL HIGH (ref 0.61–1.24)
GFR calc Af Amer: 48 mL/min — ABNORMAL LOW (ref >60)
GFR calc non Af Amer: 41 mL/min — ABNORMAL LOW (ref >60)
GLUCOSE: 187 mg/dL — AB (ref 65–99)
Potassium: 4.2 mmol/L (ref 3.5–5.1)
Sodium: 133 mmol/L — ABNORMAL LOW (ref 135–145)

## 2016-01-07 LAB — CBC
HEMATOCRIT: 48.7 % (ref 39.0–52.0)
HEMOGLOBIN: 16.2 g/dL (ref 13.0–17.0)
MCH: 33.9 pg (ref 26.0–34.0)
MCHC: 33.3 g/dL (ref 30.0–36.0)
MCV: 101.9 fL — ABNORMAL HIGH (ref 78.0–100.0)
Platelets: 143 10*3/uL — ABNORMAL LOW (ref 150–400)
RBC: 4.78 MIL/uL (ref 4.22–5.81)
RDW: 14.1 % (ref 11.5–15.5)
WBC: 16.5 10*3/uL — ABNORMAL HIGH (ref 4.0–10.5)

## 2016-01-07 LAB — LACTIC ACID, PLASMA: Lactic Acid, Venous: 1.9 mmol/L (ref 0.5–1.9)

## 2016-01-07 LAB — TROPONIN I
Troponin I: <0.03 ng/mL (ref <0.03)
Troponin I: <0.03 ng/mL (ref <0.03)

## 2016-01-07 LAB — HIV ANTIBODY (ROUTINE TESTING W REFLEX): HIV SCREEN 4TH GENERATION: NONREACTIVE

## 2016-01-07 MED ORDER — IPRATROPIUM-ALBUTEROL 0.5-2.5 (3) MG/3ML IN SOLN
3.0000 mL | Freq: Four times a day (QID) | RESPIRATORY_TRACT | Status: DC
  Administered 2016-01-07 – 2016-01-08 (×2): 3 mL via RESPIRATORY_TRACT
  Filled 2016-01-07 (×2): qty 3

## 2016-01-07 NOTE — Care Management Important Message (Signed)
Important Message  Patient Details  Name: Edwin [name] MRN: [id number] Date of Birth: 1941-02-27   Medicare Important Message Given:  Yes    [name] 01/07/2016, 2:00 PM

## 2016-01-07 NOTE — Telephone Encounter (Signed)
Wife of the patient called to speak with a nurse about [place]. He was hospitalized on yesterday. He is a patient of Dr. Neldon Mc last seen on 11-20-2015.

## 2016-01-07 NOTE — Progress Notes (Signed)
[facility] PROGRESS NOTE  Edwin [name] [id number] DOB: [birth year] DOA: 01/06/2016   PCP: Scarlette Calico, MD     Assessment/Plan: Principal Problem:   CAP (community acquired pneumonia) Active Problems:   Essential hypertension   GERD   COPD exacerbation ([place])   Chest pain   AKI (acute kidney injury) ([place])   Edwin [name] is a 75 y.o. gentleman with a history of COPD (he is not on home oxygen; he has never been admitted to the hospital for COPD exacerbation), HTN, gout and glaucoma who feels that he was in his baseline state of heath until this afternoon when he developed the acute onset of substernal chest pressure associated with shortness of breath.CTA chest negative for acute PE, but bilateral pulmonary infiltrates identified, suggestive of pneumonia   Assessment and plan  CAP causing COPD exacerbation --Continue IV Rocephin and oral azithromycin Follow blood cultures --Urine legionella pending and streptococcal antigen  negative --Sputum culture if the patient can produce a sample --Incentive spirometer use --Mucinex prn --Duonebs QID and prn --Prednisone 40mg  daily for now (no significant wheeze on exam) --Wean supplemental oxygen as tolerated  Chest pain, pleurisy vs angina vs musculoskeletal --Telemetry monitoring --Serial troponin negative 2 --echo in the AM --Analgesics as needed  Essential hypertension-controlled --Holding olmesartan/HCTZ for elevated creatinine --IV hydralazine prn plus low dose amlodipine for now  GERD --PPI  AKI --NS overnight, repeat BMP in the AM.  Holding ARB and diuretic for now.    DVT prophylaxsis Lovenox  Code Status:  Full code    Family Communication: Discussed in detail with the patient, all imaging results, lab results explained to the patient   Disposition Plan:  Anticipate discharge tomorrow     Consultants:  None  Procedures:  None  Antibiotics: Anti-infectives    Start      Dose/Rate Route Frequency Ordered Stop   01/07/16 2100  cefTRIAXone (ROCEPHIN) 1 g in dextrose 5 % 50 mL IVPB     1 g 100 mL/hr over 30 Minutes Intravenous Every 24 hours 01/06/16 2036 01/14/16 2059   01/07/16 2100  azithromycin (ZITHROMAX) tablet 500 mg     500 mg Oral Every 24 hours 01/06/16 2036 01/14/16 2059      HPI/Subjective: Patient states that the shortness of breath is significantly better than yesterday  Objective: Vitals:   01/06/16 2036 01/06/16 2044 01/06/16 2129 01/07/16 0549  BP:   (!) 159/74 137/80  Pulse:   (!) 113 73  Resp:   20 19  Temp:   99.1 F (37.3 C)   TempSrc:   Oral   SpO2: 90% 93% 93% 96%  Weight:      Height:        Intake/Output Summary (Last 24 hours) at 01/07/16 0802 Last data filed at 01/07/16 0602  Gross per 24 hour  Intake           1172.5 ml  Output              250 ml  Net            922.5 ml    Exam:  Examination:  General exam: Appears calm and comfortable  Respiratory system: Clear to auscultation. Respiratory effort normal. Cardiovascular system: S1 & S2 heard, RRR. No JVD, murmurs, rubs, gallops or clicks. No pedal edema. Gastrointestinal system: Abdomen is nondistended, soft and nontender. No organomegaly or masses felt. Normal bowel sounds heard. Central nervous system: Alert and oriented. No focal  neurological deficits. Extremities: Symmetric 5 x 5 power. Skin: No rashes, lesions or ulcers Psychiatry: Judgement and insight appear normal. Mood & affect appropriate.     Data Reviewed: I have personally reviewed following labs and imaging studies  Micro Results No results found for this or any previous visit (from the past 240 hour(s)).  Radiology Reports Dg Chest 2 View  Result Date: 01/06/2016 CLINICAL DATA:  Shortness of breath and chest pain. EXAM: CHEST  2 VIEW COMPARISON:  February 15, 2015 FINDINGS: Minimal opacity in the left lung base may represent atelectasis versus early infiltrate. The heart, hila,  mediastinum, lungs, and pleura are otherwise unremarkable. IMPRESSION: Minimal opacity in the left lung base may represent atelectasis versus early infiltrate. Recommend clinical correlation and follow-up as clinically warranted. Electronically Signed   By: Dorise Bullion III M.D   On: 01/06/2016 15:45   Ct Angio Chest Pe W Or Wo Contrast  Result Date: 01/06/2016 CLINICAL DATA:  Chest pain and shortness of breath for 2 days EXAM: CT ANGIOGRAPHY CHEST WITH CONTRAST TECHNIQUE: Multidetector CT imaging of the chest was performed using the standard protocol during bolus administration of intravenous contrast. Multiplanar CT image reconstructions and MIPs were obtained to evaluate the vascular anatomy. CONTRAST:  80 mL Isovue 370. COMPARISON:  None. FINDINGS: Mediastinum/Lymph Nodes: No hilar or mediastinal adenopathy is noted. The thoracic inlet is within normal limits. Cardiovascular: The thoracic aorta demonstrates some calcifications without aneurysmal dilatation. Coronary calcifications are noted as well. The pulmonary artery shows a normal branching pattern without evidence of pulmonary emboli. Lungs/Pleura: Lungs are well aerated bilaterally. Patchy infiltrative changes are noted in the lingula as well as the left and right lower lobes. No sizable effusion or pneumothorax is noted. No parenchymal nodule is seen. Upper abdomen: Mild fatty infiltration of the liver is seen. The gallbladder has been surgically removed. Musculoskeletal: The osseous structures show no acute abnormality. Review of the MIP images confirms the above findings. IMPRESSION: Patchy bilateral infiltrates as described. No evidence of pulmonary emboli. Electronically Signed   By: Inez Catalina M.D.   On: 01/06/2016 17:37     CBC  Recent Labs Lab 01/06/16 1520 01/07/16 0217  WBC 15.6* 16.5*  HGB 16.8 16.2  HCT 51.1 48.7  PLT 183 143*  MCV 102.4* 101.9*  MCH 33.7 33.9  MCHC 32.9 33.3  RDW 14.1 14.1    Chemistries   Recent  Labs Lab 01/06/16 1520 01/07/16 0217  NA 137 133*  K 3.9 4.2  CL 102 101  CO2 24 23  GLUCOSE 128* 187*  BUN 20 19  CREATININE 1.48* 1.59*  CALCIUM 9.7 8.9   ------------------------------------------------------------------------------------------------------------------ estimated creatinine clearance is 47.3 mL/min (by C-G formula based on SCr of 1.59 mg/dL). ------------------------------------------------------------------------------------------------------------------ No results for input(s): HGBA1C in the last 72 hours. ------------------------------------------------------------------------------------------------------------------ No results for input(s): CHOL, HDL, LDLCALC, TRIG, CHOLHDL, LDLDIRECT in the last 72 hours. ------------------------------------------------------------------------------------------------------------------ No results for input(s): TSH, T4TOTAL, T3FREE, THYROIDAB in the last 72 hours.  Invalid input(s): FREET3 ------------------------------------------------------------------------------------------------------------------ No results for input(s): VITAMINB12, FOLATE, FERRITIN, TIBC, IRON, RETICCTPCT in the last 72 hours.  Coagulation profile No results for input(s): INR, PROTIME in the last 168 hours.   Recent Labs  01/06/16 1520  DDIMER 0.36    Cardiac Enzymes  Recent Labs Lab 01/06/16 2102 01/07/16 0217  TROPONINI <0.03 <0.03   ------------------------------------------------------------------------------------------------------------------ Invalid input(s): POCBNP   CBG: No results for input(s): GLUCAP in the last 168 hours.     Studies: Dg Chest 2 View  Result Date: 01/06/2016 CLINICAL DATA:  Shortness of breath and chest pain. EXAM: CHEST  2 VIEW COMPARISON:  February 15, 2015 FINDINGS: Minimal opacity in the left lung base may represent atelectasis versus early infiltrate. The heart, hila, mediastinum, lungs, and pleura  are otherwise unremarkable. IMPRESSION: Minimal opacity in the left lung base may represent atelectasis versus early infiltrate. Recommend clinical correlation and follow-up as clinically warranted. Electronically Signed   By: Dorise Bullion III M.D   On: 01/06/2016 15:45   Ct Angio Chest Pe W Or Wo Contrast  Result Date: 01/06/2016 CLINICAL DATA:  Chest pain and shortness of breath for 2 days EXAM: CT ANGIOGRAPHY CHEST WITH CONTRAST TECHNIQUE: Multidetector CT imaging of the chest was performed using the standard protocol during bolus administration of intravenous contrast. Multiplanar CT image reconstructions and MIPs were obtained to evaluate the vascular anatomy. CONTRAST:  80 mL Isovue 370. COMPARISON:  None. FINDINGS: Mediastinum/Lymph Nodes: No hilar or mediastinal adenopathy is noted. The thoracic inlet is within normal limits. Cardiovascular: The thoracic aorta demonstrates some calcifications without aneurysmal dilatation. Coronary calcifications are noted as well. The pulmonary artery shows a normal branching pattern without evidence of pulmonary emboli. Lungs/Pleura: Lungs are well aerated bilaterally. Patchy infiltrative changes are noted in the lingula as well as the left and right lower lobes. No sizable effusion or pneumothorax is noted. No parenchymal nodule is seen. Upper abdomen: Mild fatty infiltration of the liver is seen. The gallbladder has been surgically removed. Musculoskeletal: The osseous structures show no acute abnormality. Review of the MIP images confirms the above findings. IMPRESSION: Patchy bilateral infiltrates as described. No evidence of pulmonary emboli. Electronically Signed   By: Inez Catalina M.D.   On: 01/06/2016 17:37      Lab Results  Component Value Date   HGBA1C 5.6 01/08/2015   HGBA1C 5.2 10/03/2013   HGBA1C 6.1 05/13/2012   Lab Results  Component Value Date   LDLCALC 84 10/03/2013   CREATININE 1.59 (H) 01/07/2016       Scheduled Meds: .  allopurinol  300 mg Oral Daily  . amLODipine  5 mg Oral Daily  . aspirin EC  81 mg Oral Daily  . azithromycin  500 mg Oral Q24H  . brimonidine  1 drop Both Eyes BID  . cefTRIAXone (ROCEPHIN)  IV  1 g Intravenous Q24H  . enoxaparin (LOVENOX) injection  40 mg Subcutaneous Q24H  . FLUoxetine  20 mg Oral Daily  . fluticasone furoate-vilanterol  1 puff Inhalation Daily  . ipratropium-albuterol  3 mL Nebulization QID  . latanoprost  1 drop Both Eyes QHS  . montelukast  10 mg Oral QHS  . pantoprazole  40 mg Oral Daily  . predniSONE  40 mg Oral Q breakfast  . psyllium  1 packet Oral Daily   Continuous Infusions: . sodium chloride 75 mL/hr at 01/06/16 2054     LOS: 1 day    Time spent: >30 MINS    [facility]  [facility] Pager [phone]. If 7PM-7AM, please contact night-coverage at [email or web address], password [facility] 01/07/2016, 8:02 AM  LOS: 1 day

## 2016-01-07 NOTE — Telephone Encounter (Signed)
Lm for pt to call us back  

## 2016-01-08 ENCOUNTER — Inpatient Hospital Stay (HOSPITAL_COMMUNITY): Payer: Medicare Other

## 2016-01-08 DIAGNOSIS — N179 Acute kidney failure, unspecified: Secondary | ICD-10-CM

## 2016-01-08 DIAGNOSIS — R0600 Dyspnea, unspecified: Secondary | ICD-10-CM

## 2016-01-08 LAB — COMPREHENSIVE METABOLIC PANEL
ALT: 18 U/L (ref 17–63)
ANION GAP: 8 (ref 5–15)
AST: 30 U/L (ref 15–41)
Albumin: 3.3 g/dL — ABNORMAL LOW (ref 3.5–5.0)
Alkaline Phosphatase: 37 U/L — ABNORMAL LOW (ref 38–126)
BUN: 24 mg/dL — ABNORMAL HIGH (ref 6–20)
CALCIUM: 8.7 mg/dL — AB (ref 8.9–10.3)
CHLORIDE: 102 mmol/L (ref 101–111)
CO2: 27 mmol/L (ref 22–32)
CREATININE: 1.3 mg/dL — AB (ref 0.61–1.24)
GFR, EST NON AFRICAN AMERICAN: 52 mL/min — AB (ref >60)
Glucose, Bld: 133 mg/dL — ABNORMAL HIGH (ref 65–99)
Potassium: 3.9 mmol/L (ref 3.5–5.1)
Sodium: 137 mmol/L (ref 135–145)
Total Bilirubin: 0.8 mg/dL (ref 0.3–1.2)
Total Protein: 6.1 g/dL — ABNORMAL LOW (ref 6.5–8.1)

## 2016-01-08 LAB — ECHOCARDIOGRAM COMPLETE
Height: 70.5 in
WEIGHTICAEL: 3442.7 [oz_av]

## 2016-01-08 LAB — CBC
HCT: 42.1 % (ref 39.0–52.0)
Hemoglobin: 13.6 g/dL (ref 13.0–17.0)
MCH: 33.1 pg (ref 26.0–34.0)
MCHC: 32.3 g/dL (ref 30.0–36.0)
MCV: 102.4 fL — AB (ref 78.0–100.0)
PLATELETS: 146 10*3/uL — AB (ref 150–400)
RBC: 4.11 MIL/uL — AB (ref 4.22–5.81)
RDW: 14.4 % (ref 11.5–15.5)
WBC: 15.1 10*3/uL — AB (ref 4.0–10.5)

## 2016-01-08 LAB — LEGIONELLA PNEUMOPHILA SEROGP 1 UR AG: L. PNEUMOPHILA SEROGP 1 UR AG: NEGATIVE

## 2016-01-08 MED ORDER — MONTELUKAST SODIUM 10 MG PO TABS: 10.0000 mg | ORAL_TABLET | Freq: Every day | ORAL | 0 refills | Status: DC

## 2016-01-08 MED ORDER — FLUTICASONE FUROATE-VILANTEROL 200-25 MCG/INH IN AEPB: 1.0000 | INHALATION_SPRAY | Freq: Every day | RESPIRATORY_TRACT | 5 refills | Status: DC

## 2016-01-08 MED ORDER — PREDNISONE 20 MG PO TABS: 40.0000 mg | ORAL_TABLET | Freq: Every day | ORAL | 0 refills | Status: AC

## 2016-01-08 MED ORDER — CEFDINIR 300 MG PO CAPS: 300.0000 mg | ORAL_CAPSULE | Freq: Two times a day (BID) | ORAL | 0 refills | Status: AC

## 2016-01-08 MED ORDER — AZITHROMYCIN 500 MG PO TABS: 500.0000 mg | ORAL_TABLET | ORAL | 0 refills | Status: DC

## 2016-01-08 MED ORDER — OLMESARTAN MEDOXOMIL-HCTZ 40-12.5 MG PO TABS: 0.5000 | ORAL_TABLET | Freq: Every day | ORAL | 3 refills | Status: DC

## 2016-01-08 NOTE — Progress Notes (Signed)
Patient ambulated in hall on room air. Oxygen sats were 90-93 percent while ambulating. No complaints of shortness of breath.

## 2016-01-08 NOTE — Discharge Summary (Signed)
Physician Discharge Summary  ELLIJAH LEFFEL MRN: [id number] DOB/AGE: 12-10-1940 75 y.o.  PCP: Scarlette Calico, MD   Admit date: 01/06/2016 Discharge date: 01/08/2016  Discharge Diagnoses:    Principal Problem:   CAP (community acquired pneumonia) Active Problems:   Essential hypertension   GERD   COPD exacerbation ([place])   Chest pain   AKI (acute kidney injury) ([place])    Follow-up recommendations Follow-up with PCP in 3-5 days , including all  additional recommended appointments as below Follow-up CBC, CMP in 3-5 days       Current Discharge Medication List    START taking these medications   Details  azithromycin (ZITHROMAX) 500 MG tablet Take 1 tablet (500 mg total) by mouth daily. Qty: 5 tablet, Refills: 0  Omnicef 300 mg twice a day for 5 days   montelukast (SINGULAIR) 10 MG tablet Take 1 tablet (10 mg total) by mouth at bedtime. Qty: 30 tablet, Refills: 0    predniSONE (DELTASONE) 20 MG tablet Take 2 tablets (40 mg total) by mouth daily with breakfast. Qty: 10 tablet, Refills: 0      CONTINUE these medications which have CHANGED   Details  fluticasone furoate-vilanterol (BREO ELLIPTA) 200-25 MCG/INH AEPB Inhale 1 puff into the lungs daily. Qty: 60 each, Refills: 5    olmesartan-hydrochlorothiazide (BENICAR HCT) 40-12.5 MG tablet Take 0.5 tablets by mouth daily. Qty: 30 tablet, Refills: 3   Associated Diagnoses: Essential hypertension      CONTINUE these medications which have NOT CHANGED   Details  acetaminophen (TYLENOL) 325 MG tablet Take 650 mg by mouth every 6 (six) hours as needed for mild pain.    albuterol (PROVENTIL HFA) 108 (90 BASE) MCG/ACT inhaler Inhale 2 puffs into the lungs every 4 (four) hours as needed for wheezing or shortness of breath. 1-2 puffs every 4 hrs as needed. Qty: 1 Inhaler, Refills: 0    allopurinol (ZYLOPRIM) 300 MG tablet TAKE 1 TABLET (300 MG TOTAL) BY MOUTH DAILY. Qty: 90 tablet, Refills: 1    aspirin EC 81 MG tablet Take  81 mg by mouth daily.    bimatoprost (LUMIGAN) 0.03 % ophthalmic solution Place 1 drop into both eyes at bedtime.      brimonidine (ALPHAGAN P) 0.1 % SOLN 1 drop 2 (two) times daily. Both eyes     DEXILANT 60 MG capsule TAKE 1 CAPSULE (60 MG TOTAL) BY MOUTH DAILY. Qty: 90 capsule, Refills: 0    dextromethorphan-guaiFENesin (MUCINEX DM) 30-600 MG 12hr tablet Take 1 tablet by mouth daily as needed for cough.     EPINEPHrine 0.3 mg/0.3 mL IJ SOAJ injection Inject 0.3 mLs (0.3 mg total) into the muscle once. Qty: 2 Device, Refills: 1    famotidine (PEPCID) 20 MG tablet One at bedtime    FLUoxetine (PROZAC) 10 MG capsule TAKE ONE CAPSULE BY MOUTH ONE TIME DAILY. Qty: 90 capsule, Refills: 3    Glucosamine-Chondroit-Vit C-Mn (GLUCOSAMINE 1500 COMPLEX PO) Take 2 tablets by mouth every evening.     ipratropium-albuterol (DUONEB) 0.5-2.5 (3) MG/3ML SOLN Take 3 mLs by nebulization every 4 (four) hours as needed. Qty: 300 mL, Refills: 5    NASONEX 50 MCG/ACT nasal spray place 2 sprays into each nostril daily as needed Qty: 17 g, Refills: 4    omalizumab (XOLAIR) 150 MG injection Inject 150 mg into the skin every 14 (fourteen) days.    psyllium (METAMUCIL) 58.6 % powder Take 1 packet by mouth daily.      umeclidinium bromide (INCRUSE  ELLIPTA) 62.5 MCG/INH AEPB Inhale 1 puff into the lungs daily. Qty: 30 each, Refills: 5      STOP taking these medications     ibuprofen (ADVIL,MOTRIN) 200 MG tablet          Discharge Condition:Stable  Discharge Instructions Get Medicines reviewed and adjusted: Please take all your medications with you for your next visit with your Primary MD  Please request your Primary MD to go over all hospital tests and procedure/radiological results at the follow up, please ask your Primary MD to get all Hospital records sent to his/her office.  If you experience worsening of your admission symptoms, develop shortness of breath, life threatening emergency,  suicidal or homicidal thoughts you must seek medical attention immediately by calling 911 or calling your MD immediately if symptoms less severe.  You must read complete instructions/literature along with all the possible adverse reactions/side effects for all the Medicines you take and that have been prescribed to you. Take any new Medicines after you have completely understood and accpet all the possible adverse reactions/side effects.   Do not drive when taking Pain medications.   Do not take more than prescribed Pain, Sleep and Anxiety Medications  Special Instructions: If you have smoked or chewed Tobacco in the last 2 yrs please stop smoking, stop any regular Alcohol and or any Recreational drug use.  Wear Seat belts while driving.  Please note  You were cared for by a hospitalist during your hospital stay. Once you are discharged, your primary care physician will handle any further medical issues. Please note that NO REFILLS for any discharge medications will be authorized once you are discharged, as it is imperative that you return to your primary care physician (or establish a relationship with a primary care physician if you do not have one) for your aftercare needs so that they can reassess your need for medications and monitor your lab values.     Allergies  Allergen Reactions  . Ramipril     REACTION: chest congestion & cough. No angioedema      Disposition:    Consults: * None     Significant Diagnostic Studies:  Dg Chest 2 View  Result Date: 01/06/2016 CLINICAL DATA:  Shortness of breath and chest pain. EXAM: CHEST  2 VIEW COMPARISON:  February 15, 2015 FINDINGS: Minimal opacity in the left lung base may represent atelectasis versus early infiltrate. The heart, hila, mediastinum, lungs, and pleura are otherwise unremarkable. IMPRESSION: Minimal opacity in the left lung base may represent atelectasis versus early infiltrate. Recommend clinical correlation and  follow-up as clinically warranted. Electronically Signed   By: Dorise Bullion III M.D   On: 01/06/2016 15:45   Ct Angio Chest Pe W Or Wo Contrast  Result Date: 01/06/2016 CLINICAL DATA:  Chest pain and shortness of breath for 2 days EXAM: CT ANGIOGRAPHY CHEST WITH CONTRAST TECHNIQUE: Multidetector CT imaging of the chest was performed using the standard protocol during bolus administration of intravenous contrast. Multiplanar CT image reconstructions and MIPs were obtained to evaluate the vascular anatomy. CONTRAST:  80 mL Isovue 370. COMPARISON:  None. FINDINGS: Mediastinum/Lymph Nodes: No hilar or mediastinal adenopathy is noted. The thoracic inlet is within normal limits. Cardiovascular: The thoracic aorta demonstrates some calcifications without aneurysmal dilatation. Coronary calcifications are noted as well. The pulmonary artery shows a normal branching pattern without evidence of pulmonary emboli. Lungs/Pleura: Lungs are well aerated bilaterally. Patchy infiltrative changes are noted in the lingula as well as the left and  right lower lobes. No sizable effusion or pneumothorax is noted. No parenchymal nodule is seen. Upper abdomen: Mild fatty infiltration of the liver is seen. The gallbladder has been surgically removed. Musculoskeletal: The osseous structures show no acute abnormality. Review of the MIP images confirms the above findings. IMPRESSION: Patchy bilateral infiltrates as described. No evidence of pulmonary emboli. Electronically Signed   By: Inez Catalina M.D.   On: 01/06/2016 17:37         Filed Weights   01/06/16 2033 01/07/16 2020  Weight: 93.7 kg (206 lb 9.1 oz) 97.6 kg (215 lb 2.7 oz)     Microbiology: Recent Results (from the past 240 hour(s))  Blood culture (routine x 2)     Status: None (Preliminary result)   Collection Time: 01/06/16  6:47 PM  Result Value Ref Range Status   Specimen Description BLOOD RIGHT ANTECUBITAL  Final   Special Requests BOTTLES DRAWN AEROBIC  AND ANAEROBIC 5CC  Final   Culture NO GROWTH < 24 HOURS  Final   Report Status PENDING  Incomplete  Blood culture (routine x 2)     Status: None (Preliminary result)   Collection Time: 01/06/16  6:54 PM  Result Value Ref Range Status   Specimen Description BLOOD RIGHT HAND  Final   Special Requests BOTTLES DRAWN AEROBIC AND ANAEROBIC 5CC  Final   Culture NO GROWTH < 24 HOURS  Final   Report Status PENDING  Incomplete       Blood Culture    Component Value Date/Time   SDES BLOOD RIGHT HAND 01/06/2016 1854   SPECREQUEST BOTTLES DRAWN AEROBIC AND ANAEROBIC 5CC 01/06/2016 1854   CULT NO GROWTH < 24 HOURS 01/06/2016 1854   REPTSTATUS PENDING 01/06/2016 1854      Labs: Results for orders placed or performed during the hospital encounter of 01/06/16 (from the past 48 hour(s))  Basic metabolic panel     Status: Abnormal   Collection Time: 01/06/16  3:20 PM  Result Value Ref Range   Sodium 137 135 - 145 mmol/L   Potassium 3.9 3.5 - 5.1 mmol/L   Chloride 102 101 - 111 mmol/L   CO2 24 22 - 32 mmol/L   Glucose, Bld 128 (H) 65 - 99 mg/dL   BUN 20 6 - 20 mg/dL   Creatinine, Ser 1.48 (H) 0.61 - 1.24 mg/dL   Calcium 9.7 8.9 - 10.3 mg/dL   GFR calc non Af Amer 45 (L) >60 mL/min   GFR calc Af Amer 52 (L) >60 mL/min    Comment: (NOTE) The eGFR has been calculated using the CKD EPI equation. This calculation has not been validated in all clinical situations. eGFR's persistently <60 mL/min signify possible Chronic Kidney Disease.    Anion gap 11 5 - 15  CBC     Status: Abnormal   Collection Time: 01/06/16  3:20 PM  Result Value Ref Range   WBC 15.6 (H) 4.0 - 10.5 K/uL   RBC 4.99 4.22 - 5.81 MIL/uL   Hemoglobin 16.8 13.0 - 17.0 g/dL   HCT 51.1 39.0 - 52.0 %   MCV 102.4 (H) 78.0 - 100.0 fL   MCH 33.7 26.0 - 34.0 pg   MCHC 32.9 30.0 - 36.0 g/dL   RDW 14.1 11.5 - 15.5 %   Platelets 183 150 - 400 K/uL  D-dimer, quantitative (not at [facility])     Status: None   Collection Time: 01/06/16   3:20 PM  Result Value Ref Range   D-Dimer, [place] Brown  0.36 0.00 - 0.50 ug/mL-FEU    Comment: (NOTE) At the manufacturer cut-off of 0.50 ug/mL FEU, this assay has been documented to exclude PE with a sensitivity and negative predictive value of 97 to 99%.  At this time, this assay has not been approved by the FDA to exclude DVT/VTE. Results should be correlated with clinical presentation.   Brain natriuretic peptide     Status: None   Collection Time: 01/06/16  3:20 PM  Result Value Ref Range   B Natriuretic Peptide 18.9 0.0 - 100.0 pg/mL  I-stat troponin, ED     Status: None   Collection Time: 01/06/16  3:28 PM  Result Value Ref Range   Troponin i, poc 0.00 0.00 - 0.08 ng/mL   Comment 3            Comment: Due to the release kinetics of cTnI, a negative result within the first hours of the onset of symptoms does not rule out myocardial infarction with certainty. If myocardial infarction is still suspected, repeat the test at appropriate intervals.   Blood culture (routine x 2)     Status: None (Preliminary result)   Collection Time: 01/06/16  6:47 PM  Result Value Ref Range   Specimen Description BLOOD RIGHT ANTECUBITAL    Special Requests BOTTLES DRAWN AEROBIC AND ANAEROBIC 5CC    Culture NO GROWTH < 24 HOURS    Report Status PENDING   Blood culture (routine x 2)     Status: None (Preliminary result)   Collection Time: 01/06/16  6:54 PM  Result Value Ref Range   Specimen Description BLOOD RIGHT HAND    Special Requests BOTTLES DRAWN AEROBIC AND ANAEROBIC 5CC    Culture NO GROWTH < 24 HOURS    Report Status PENDING   HIV antibody     Status: None   Collection Time: 01/06/16  9:02 PM  Result Value Ref Range   HIV Screen 4th Generation wRfx Non Reactive Non Reactive    Comment: (NOTE) Performed At: [facility] [place], [place] [id number] Lindon Romp MD [id number]   Troponin I (q 6hr x 3)     Status: None   Collection Time: 01/06/16  9:02 PM   Result Value Ref Range   Troponin I <0.03 <0.03 ng/mL  Strep pneumoniae urinary antigen     Status: None   Collection Time: 01/06/16 11:09 PM  Result Value Ref Range   Strep Pneumo Urinary Antigen NEGATIVE NEGATIVE    Comment:        Infection due to S. pneumoniae cannot be absolutely ruled out since the antigen present may be below the detection limit of the test.   Lactic acid, plasma     Status: None   Collection Time: 01/07/16 12:15 AM  Result Value Ref Range   Lactic Acid, Venous 1.9 0.5 - 1.9 mmol/L  CBC     Status: Abnormal   Collection Time: 01/07/16  2:17 AM  Result Value Ref Range   WBC 16.5 (H) 4.0 - 10.5 K/uL   RBC 4.78 4.22 - 5.81 MIL/uL   Hemoglobin 16.2 13.0 - 17.0 g/dL   HCT 48.7 39.0 - 52.0 %   MCV 101.9 (H) 78.0 - 100.0 fL   MCH 33.9 26.0 - 34.0 pg   MCHC 33.3 30.0 - 36.0 g/dL   RDW 14.1 11.5 - 15.5 %   Platelets 143 (L) 150 - 400 K/uL  Basic metabolic panel     Status: Abnormal  Collection Time: 01/07/16  2:17 AM  Result Value Ref Range   Sodium 133 (L) 135 - 145 mmol/L   Potassium 4.2 3.5 - 5.1 mmol/L   Chloride 101 101 - 111 mmol/L   CO2 23 22 - 32 mmol/L   Glucose, Bld 187 (H) 65 - 99 mg/dL   BUN 19 6 - 20 mg/dL   Creatinine, Ser 1.59 (H) 0.61 - 1.24 mg/dL   Calcium 8.9 8.9 - 10.3 mg/dL   GFR calc non Af Amer 41 (L) >60 mL/min   GFR calc Af Amer 48 (L) >60 mL/min    Comment: (NOTE) The eGFR has been calculated using the CKD EPI equation. This calculation has not been validated in all clinical situations. eGFR's persistently <60 mL/min signify possible Chronic Kidney Disease.    Anion gap 9 5 - 15  Troponin I (q 6hr x 3)     Status: None   Collection Time: 01/07/16  2:17 AM  Result Value Ref Range   Troponin I <0.03 <0.03 ng/mL  Troponin I (q 6hr x 3)     Status: None   Collection Time: 01/07/16  9:24 AM  Result Value Ref Range   Troponin I <0.03 <0.03 ng/mL  Comprehensive metabolic panel     Status: Abnormal   Collection Time:  01/08/16  5:08 AM  Result Value Ref Range   Sodium 137 135 - 145 mmol/L   Potassium 3.9 3.5 - 5.1 mmol/L   Chloride 102 101 - 111 mmol/L   CO2 27 22 - 32 mmol/L   Glucose, Bld 133 (H) 65 - 99 mg/dL   BUN 24 (H) 6 - 20 mg/dL   Creatinine, Ser 1.30 (H) 0.61 - 1.24 mg/dL   Calcium 8.7 (L) 8.9 - 10.3 mg/dL   Total Protein 6.1 (L) 6.5 - 8.1 g/dL   Albumin 3.3 (L) 3.5 - 5.0 g/dL   AST 30 15 - 41 U/L   ALT 18 17 - 63 U/L   Alkaline Phosphatase 37 (L) 38 - 126 U/L   Total Bilirubin 0.8 0.3 - 1.2 mg/dL   GFR calc non Af Amer 52 (L) >60 mL/min   GFR calc Af Amer >60 >60 mL/min    Comment: (NOTE) The eGFR has been calculated using the CKD EPI equation. This calculation has not been validated in all clinical situations. eGFR's persistently <60 mL/min signify possible Chronic Kidney Disease.    Anion gap 8 5 - 15  CBC     Status: Abnormal   Collection Time: 01/08/16  5:08 AM  Result Value Ref Range   WBC 15.1 (H) 4.0 - 10.5 K/uL   RBC 4.11 (L) 4.22 - 5.81 MIL/uL   Hemoglobin 13.6 13.0 - 17.0 g/dL   HCT 42.1 39.0 - 52.0 %   MCV 102.4 (H) 78.0 - 100.0 fL   MCH 33.1 26.0 - 34.0 pg   MCHC 32.3 30.0 - 36.0 g/dL   RDW 14.4 11.5 - 15.5 %   Platelets 146 (L) 150 - 400 K/uL     Lipid Panel     Component Value Date/Time   CHOL 145 12/26/2014 1519   TRIG 233.0 (H) 12/26/2014 1519   HDL 42.90 12/26/2014 1519   CHOLHDL 3 12/26/2014 1519   VLDL 46.6 (H) 12/26/2014 1519   LDLCALC 84 10/03/2013 1007   LDLDIRECT 78.0 12/26/2014 1519     Lab Results  Component Value Date   HGBA1C 5.6 01/08/2015   HGBA1C 5.2 10/03/2013   HGBA1C 6.1 05/13/2012  Olman Yono Fieldsis a 75 y.o.gentleman with a history of COPD (he is not on home oxygen; he has never been admitted to the hospital for COPD exacerbation), HTN, gout and glaucoma who feels that he was in his baseline state of heath until this afternoon when he developed the acute onset of substernal chest pressure associated with shortness  of breath.CTA chest negative for acute PE, but bilateral pulmonary infiltrates identified, suggestive of pneumonia   Hospital course CAP causing COPD exacerbation --Initiated on IV Rocephin and oral azithromycin, now switched to [facility] and azithromycin for 5 days Blood cultures no growth so far --Urine legionella pending and streptococcal antigen  negative --Sputum culture if the patient can produce a sample --Incentive spirometer use --Mucinex prn --Duonebs QID and prn --Prednisone 4m daily for now (no significant wheeze on exam), continue for 5 days and then discontinue 94% on room air   Chest pain, pleurisy vs angina vs musculoskeletal --Telemetry monitoring, uneventful --Serial troponin negative 2 --echo 2-D echo completed results pending --Analgesics as needed  Essential hypertension-controlled --Holding olmesartan/HCTZ for elevated creatinine, creatinine 1.48, now 1.3 Patient can resume this in one week  GERD --PPI  AKI --NS overnight, repeat BMP in the AM. Holding ARB and diuretic for now.       Discharge Exam:  Blood pressure 128/80, pulse 95, temperature 97.7 F (36.5 C), temperature source Oral, resp. rate 18, height 5' 10.5" (1.791 m), weight 97.6 kg (215 lb 2.7 oz), SpO2 94 %.  General exam: Appears calm and comfortable  Respiratory system: Clear to auscultation. Respiratory effort normal. Cardiovascular system: S1 & S2 heard, RRR. No JVD, murmurs, rubs, gallops or clicks. No pedal edema. Gastrointestinal system: Abdomen is nondistended, soft and nontender. No organomegaly or masses felt. Normal bowel sounds heard. Central nervous system: Alert and oriented. No focal neurological deficits. Extremities: Symmetric 5 x 5 power. Skin: No rashes, lesions or ulcers Psychiatry: Judgement and insight appear normal. Mood & affect appropriate.    Follow-up Information    TScarlette Calico MD. Schedule an appointment as soon as possible for a visit in 2  day(s).   Specialty:  Internal Medicine Why:  Please call to make this appointment as soon as possible, hospital follow-up Contact information: [phone]           Signed: AReyne Dumas8/01/2016, 8:05 AM        Time spent >45 mins

## 2016-01-08 NOTE — Progress Notes (Signed)
  Echocardiogram 2D Echocardiogram has been performed.  Jennette Dubin 01/08/2016, 9:16 AM

## 2016-01-09 ENCOUNTER — Telehealth: Payer: Self-pay | Admitting: Allergy and Immunology

## 2016-01-09 NOTE — Telephone Encounter (Signed)
Pts wife said that she wants you to review his stay at the hospital. He is doing better still very tired. She wants to know if you want to do anything different from what the hospital has done or if you want to see him in clinic.  Please advise.

## 2016-01-09 NOTE — Telephone Encounter (Signed)
Patient was just discharged from the hospital and Dr. Neldon Mc told him that if anything like this happens or changes to call him. He would like a nurse to call him back.

## 2016-01-10 ENCOUNTER — Telehealth: Payer: Self-pay | Admitting: *Deleted

## 2016-01-10 NOTE — Telephone Encounter (Signed)
Transition Care Management Follow-up Telephone Call   Date discharged? 01/08/16   How have you been since you were released from the hospital? Pt states he seem to be ok still a little weak   Do you understand why you were in the hospital? YES   Do you understand the discharge instructions? YES   Where were you discharged to? Home   Items Reviewed:  Medications reviewed: YES  Allergies reviewed: YES  Dietary changes reviewed: YES  Referrals reviewed: No referral needed   Functional Questionnaire:   Activities of Daily Living (ADLs):   He states he are independent in the following: ambulation, bathing and hygiene, feeding, continence, grooming, toileting and dressing States he require assistance with the following: ambulation use walker   Any transportation issues/concerns?: YES   Any patient concerns? NO, just wanted to make sure that MD know that he has been under Dr. Neldon Mc care. Was getting Zolar injection for allergies, but he had to stop due to being on the antibiotics   Confirmed importance and date/time of follow-up visits scheduled YES, appt 01/15/16  Provider Appointment booked with Dr. Ronnald Ramp  Confirmed with patient if condition begins to worsen call PCP or go to the ER.  Patient was given the office number and encouraged to call back with question or concerns: YES

## 2016-01-10 NOTE — Telephone Encounter (Signed)
Please inform Edwin [name] and wife that I did review his hospitalization and it looks like he had a pneumonia which is a new development for him. Please have him follow up in clinic sometime in August so we can discuss this issue and see if anything else needs to be done to prevent him from developing this in the future and to also check on his lung status

## 2016-01-11 LAB — CULTURE, BLOOD (ROUTINE X 2)
CULTURE: NO GROWTH
Culture: NO GROWTH

## 2016-01-11 NOTE — Telephone Encounter (Signed)
Spoke with Bj and gave him Dr. Bruna Potter message. He made an appointment.

## 2016-01-11 NOTE — Telephone Encounter (Signed)
Pt made appt to see dr. Neldon Mc

## 2016-01-15 ENCOUNTER — Ambulatory Visit (INDEPENDENT_AMBULATORY_CARE_PROVIDER_SITE_OTHER): Payer: Medicare Other | Admitting: Internal Medicine

## 2016-01-15 ENCOUNTER — Encounter: Payer: Self-pay | Admitting: Internal Medicine

## 2016-01-15 ENCOUNTER — Ambulatory Visit (INDEPENDENT_AMBULATORY_CARE_PROVIDER_SITE_OTHER)
Admission: RE | Admit: 2016-01-15 | Discharge: 2016-01-15 | Disposition: A | Discharge status: Home / Self Care | Payer: Medicare Other | Source: Ambulatory Visit | Attending: Internal Medicine | Admitting: Internal Medicine

## 2016-01-15 VITALS — BP 178/90 | HR 89 | Temp 98.8°F | Resp 20 | Ht 70.5 in | Wt 206.3 lb

## 2016-01-15 DIAGNOSIS — R739 Hyperglycemia, unspecified: Secondary | ICD-10-CM | POA: Diagnosis not present

## 2016-01-15 DIAGNOSIS — R05 Cough: Secondary | ICD-10-CM | POA: Diagnosis not present

## 2016-01-15 DIAGNOSIS — J189 Pneumonia, unspecified organism: Secondary | ICD-10-CM

## 2016-01-15 DIAGNOSIS — J441 Chronic obstructive pulmonary disease with (acute) exacerbation: Secondary | ICD-10-CM

## 2016-01-15 DIAGNOSIS — I10 Essential (primary) hypertension: Secondary | ICD-10-CM

## 2016-01-15 MED ORDER — LEVOFLOXACIN 750 MG PO TABS: 750.0000 mg | ORAL_TABLET | Freq: Every day | ORAL | 0 refills | Status: AC

## 2016-01-15 MED ORDER — METHYLPREDNISOLONE ACETATE 80 MG/ML IJ SUSP
120.0000 mg | Freq: Once | INTRAMUSCULAR | Status: AC
  Administered 2016-01-15: 120 mg via INTRAMUSCULAR

## 2016-01-15 NOTE — Patient Instructions (Signed)

## 2016-01-15 NOTE — Progress Notes (Signed)
Subjective:  Patient ID: Edwin [name], male    DOB: 1940/10/26  Age: 75 y.o. MRN: [id number]  CC: Cough   HPI DOMANIC BOENDER presents for follow-up after a recent admission for community-acquired pneumonia. He was treated as an inpatient initially with Rocephin and Zithromax and then was transitioned to [place] and Zithromax. He has not improved much. He continues to have a cough that is productive of thick tan, green, occasionally blood-tinged phlegm as well as shortness of breath and wheezing.  Principal Problem:   CAP (community acquired pneumonia) Active Problems:   Essential hypertension   GERD   COPD exacerbation ([place])   Chest pain   AKI (acute kidney injury) [facility]   Outpatient Medications Prior to Visit  Medication Sig Dispense Refill  . acetaminophen (TYLENOL) 325 MG tablet Take 650 mg by mouth every 6 (six) hours as needed for mild pain.    [name] Kitchen albuterol (PROVENTIL HFA) 108 (90 BASE) MCG/ACT inhaler Inhale 2 puffs into the lungs every 4 (four) hours as needed for wheezing or shortness of breath. 1-2 puffs every 4 hrs as needed. 1 Inhaler 0  . allopurinol (ZYLOPRIM) 300 MG tablet TAKE 1 TABLET (300 MG TOTAL) BY MOUTH DAILY. 90 tablet 1  . aspirin EC 81 MG tablet Take 81 mg by mouth daily.    . bimatoprost (LUMIGAN) 0.03 % ophthalmic solution Place 1 drop into both eyes at bedtime.      . brimonidine (ALPHAGAN P) 0.1 % SOLN 1 drop 2 (two) times daily. Both eyes     . DEXILANT 60 MG capsule TAKE 1 CAPSULE (60 MG TOTAL) BY MOUTH DAILY. 90 capsule 0  . dextromethorphan-guaiFENesin (MUCINEX DM) 30-600 MG 12hr tablet Take 1 tablet by mouth daily as needed for cough.     . EPINEPHrine 0.3 mg/0.3 mL IJ SOAJ injection Inject 0.3 mLs (0.3 mg total) into the muscle once. 2 Device 1  . famotidine (PEPCID) 20 MG tablet One at bedtime    . FLUoxetine (PROZAC) 10 MG capsule TAKE ONE CAPSULE BY MOUTH ONE TIME DAILY. 90 capsule 3  . fluticasone furoate-vilanterol (BREO ELLIPTA) 200-25  MCG/INH AEPB Inhale 1 puff into the lungs daily. 60 each 5  . Glucosamine-Chondroit-Vit C-Mn (GLUCOSAMINE 1500 COMPLEX PO) Take 2 tablets by mouth every evening.     [name] Kitchen ipratropium-albuterol (DUONEB) 0.5-2.5 (3) MG/3ML SOLN Take 3 mLs by nebulization every 4 (four) hours as needed. 300 mL 5  . montelukast (SINGULAIR) 10 MG tablet Take 1 tablet (10 mg total) by mouth at bedtime. 30 tablet 0  . NASONEX 50 MCG/ACT nasal spray place 2 sprays into each nostril daily as needed 17 g 4  . olmesartan-hydrochlorothiazide (BENICAR HCT) 40-12.5 MG tablet Take 0.5 tablets by mouth daily. 30 tablet 3  . omalizumab (XOLAIR) 150 MG injection Inject 150 mg into the skin every 14 (fourteen) days.    [name] Kitchen psyllium (METAMUCIL) 58.6 % powder Take 1 packet by mouth daily.      [name] Kitchen umeclidinium bromide (INCRUSE ELLIPTA) 62.5 MCG/INH AEPB Inhale 1 puff into the lungs daily. 30 each 5  . azithromycin (ZITHROMAX) 500 MG tablet Take 1 tablet (500 mg total) by mouth daily. 5 tablet 0   Facility-Administered Medications Prior to Visit  Medication Dose Route Frequency Provider Last Rate Last Dose  . omalizumab Arvid Right) injection 375 mg  375 mg Subcutaneous Q14 Days Jiles Prows, MD   375 mg at 12/25/15 1503    ROS Review of Systems  Constitutional: Positive  for fatigue. Negative for appetite change, chills, diaphoresis, fever and unexpected weight change.  HENT: Negative.  Negative for facial swelling, sore throat and voice change.   Eyes: Negative.   Respiratory: Positive for cough, shortness of breath and wheezing. Negative for choking, chest tightness and stridor.   Cardiovascular: Negative.  Negative for chest pain, palpitations and leg swelling.  Gastrointestinal: Negative.  Negative for abdominal pain, constipation, diarrhea, nausea and vomiting.  Endocrine: Negative.   Genitourinary: Negative.   Musculoskeletal: Negative.  Negative for back pain and neck pain.  Skin: Negative.  Negative for color change, pallor and  rash.  Allergic/Immunologic: Negative.   Neurological: Negative.  Negative for dizziness, tremors, syncope, weakness, numbness and headaches.  Hematological: Negative.  Negative for adenopathy. Does not bruise/bleed easily.  Psychiatric/Behavioral: Negative.     Objective:  BP (!) 178/90 (BP Location: Right Arm, Patient Position: Sitting, Cuff Size: Normal)   Pulse 89   Temp 98.8 F (37.1 C) (Oral)   Resp 20   Ht 5' 10.5" (1.791 m)   Wt 206 lb 5 oz (93.6 kg)   SpO2 94%   BMI 29.18 kg/m   BP Readings from Last 3 Encounters:  01/15/16 (!) 178/90  01/08/16 (!) 154/83  11/20/15 126/72    Wt Readings from Last 3 Encounters:  01/15/16 206 lb 5 oz (93.6 kg)  01/07/16 215 lb 2.7 oz (97.6 kg)  05/08/15 209 lb (94.8 kg)    Physical Exam  Constitutional: He is oriented to person, place, and time. No distress.  HENT:  Mouth/Throat: Oropharynx is clear and moist. No oropharyngeal exudate.  Eyes: Conjunctivae are normal. Right eye exhibits no discharge. Left eye exhibits no discharge. No scleral icterus.  Neck: Normal range of motion. Neck supple. No JVD present. No tracheal deviation present. No thyromegaly present.  Cardiovascular: Normal rate, regular rhythm, normal heart sounds and intact distal pulses.  Exam reveals no gallop and no friction rub.   No murmur heard. Pulmonary/Chest: Effort normal. No accessory muscle usage or stridor. No tachypnea. No respiratory distress. He has decreased breath sounds in the right upper field, the right middle field, the right lower field, the left upper field, the left middle field and the left lower field. He has no wheezes. He has rhonchi in the right middle field, the right lower field, the left middle field and the left lower field. He has rales in the right lower field. He exhibits no tenderness.  Abdominal: Soft. Bowel sounds are normal. He exhibits no distension and no mass. There is no tenderness. There is no rebound and no guarding.    Musculoskeletal: Normal range of motion. He exhibits no edema, tenderness or deformity.  Lymphadenopathy:    He has no cervical adenopathy.  Neurological: He is oriented to person, place, and time.  Skin: Skin is warm and dry. No rash noted. He is not diaphoretic. No erythema. No pallor.  Vitals reviewed.   Lab Results  Component Value Date   WBC 15.1 (H) 01/08/2016   HGB 13.6 01/08/2016   HCT 42.1 01/08/2016   PLT 146 (L) 01/08/2016   GLUCOSE 133 (H) 01/08/2016   CHOL 145 12/26/2014   TRIG 233.0 (H) 12/26/2014   HDL 42.90 12/26/2014   LDLDIRECT 78.0 12/26/2014   LDLCALC 84 10/03/2013   ALT 18 01/08/2016   AST 30 01/08/2016   NA 137 01/08/2016   K 3.9 01/08/2016   CL 102 01/08/2016   CREATININE 1.30 (H) 01/08/2016   BUN 24 (H) 01/08/2016  CO2 27 01/08/2016   TSH 1.78 04/17/2015   PSA 1.17 01/08/2015   HGBA1C 5.6 01/08/2015    Dg Chest 2 View  Result Date: 01/06/2016 CLINICAL DATA:  Shortness of breath and chest pain. EXAM: CHEST  2 VIEW COMPARISON:  February 15, 2015 FINDINGS: Minimal opacity in the left lung base may represent atelectasis versus early infiltrate. The heart, hila, mediastinum, lungs, and pleura are otherwise unremarkable. IMPRESSION: Minimal opacity in the left lung base may represent atelectasis versus early infiltrate. Recommend clinical correlation and follow-up as clinically warranted. Electronically Signed   By: Dorise Bullion III M.D   On: 01/06/2016 15:45   Ct Angio Chest Pe W Or Wo Contrast  Result Date: 01/06/2016 CLINICAL DATA:  Chest pain and shortness of breath for 2 days EXAM: CT ANGIOGRAPHY CHEST WITH CONTRAST TECHNIQUE: Multidetector CT imaging of the chest was performed using the standard protocol during bolus administration of intravenous contrast. Multiplanar CT image reconstructions and MIPs were obtained to evaluate the vascular anatomy. CONTRAST:  80 mL Isovue 370. COMPARISON:  None. FINDINGS: Mediastinum/Lymph Nodes: No hilar or  mediastinal adenopathy is noted. The thoracic inlet is within normal limits. Cardiovascular: The thoracic aorta demonstrates some calcifications without aneurysmal dilatation. Coronary calcifications are noted as well. The pulmonary artery shows a normal branching pattern without evidence of pulmonary emboli. Lungs/Pleura: Lungs are well aerated bilaterally. Patchy infiltrative changes are noted in the lingula as well as the left and right lower lobes. No sizable effusion or pneumothorax is noted. No parenchymal nodule is seen. Upper abdomen: Mild fatty infiltration of the liver is seen. The gallbladder has been surgically removed. Musculoskeletal: The osseous structures show no acute abnormality. Review of the MIP images confirms the above findings. IMPRESSION: Patchy bilateral infiltrates as described. No evidence of pulmonary emboli. Electronically Signed   By: Inez Catalina M.D.   On: 01/06/2016 17:37    Dg Chest 2 View  Result Date: 01/15/2016 CLINICAL DATA:  Follow-up pneumonia.  Cough, congestion continues. EXAM: CHEST  2 VIEW COMPARISON:  01/06/2016 chest x-ray hand CT FINDINGS: Previously seen patchy opacity at the left base has improved. New patchy opacities throughout the right lower lobe and right middle lobe concerning for pneumonia. Heart is normal size. No effusions or acute bony abnormality. IMPRESSION: Improving opacity at the left base. New patchy opacity within the right mid and lower lung concerning for pneumonia. Electronically Signed   By: Rolm Baptise M.D.   On: 01/15/2016 16:23    Assessment & Plan:   Va was seen today for cough.  Diagnoses and all orders for this visit:  CAP (community acquired pneumonia)- His chest x-ray has worsened, the previously seen infiltrate on the left lower side has resolved but he now has new patchy opacities on the right side, I recommended that he be readmitted for IV antibiotics but he is not willing to do that. Instead I will try high-dose  fluoroquinolone to treat what appears to be an atypical pneumonia or a strep pneumonia that is resistant to Omnicef and Zithromax. -     Cancel: DG Chest 2 View; Future -     CBC with Differential/Platelet; Future -     DG Chest 2 View; Future -     levofloxacin (LEVAQUIN) 750 MG tablet; Take 1 tablet (750 mg total) by mouth daily. -     methylPREDNISolone acetate (DEPO-MEDROL) injection 120 mg; Inject 1.5 mLs (120 mg total) into the muscle once.  Essential hypertension- his blood pressure is not  adequately well controlled but he is also acutely ill so I will not make any changes at this time. Will monitor his electrolytes and renal function. -     CBC with Differential/Platelet; Future -     Comprehensive metabolic panel; Future  Hyperglycemia- his blood sugar was elevated during admission, will check an A1c today to see if he has developed type 2 diabetes. -     Hemoglobin A1c; Future   I have discontinued Mr. Fleishman's azithromycin. I am also having him start on levofloxacin. Additionally, I am having him maintain his bimatoprost, brimonidine, psyllium, Glucosamine-Chondroit-Vit C-Mn (GLUCOSAMINE 1500 COMPLEX PO), albuterol, NASONEX, FLUoxetine, famotidine, dextromethorphan-guaiFENesin, allopurinol, umeclidinium bromide, ipratropium-albuterol, DEXILANT, EPINEPHrine, aspirin EC, acetaminophen, omalizumab, fluticasone furoate-vilanterol, montelukast, and olmesartan-hydrochlorothiazide. We administered methylPREDNISolone acetate. We will continue to administer omalizumab.  Meds ordered this encounter  Medications  . levofloxacin (LEVAQUIN) 750 MG tablet    Sig: Take 1 tablet (750 mg total) by mouth daily.    Dispense:  10 tablet    Refill:  0  . methylPREDNISolone acetate (DEPO-MEDROL) injection 120 mg     Follow-up: Return in about 1 week (around 01/22/2016).  Scarlette Calico, MD

## 2016-01-15 NOTE — Progress Notes (Signed)
Pre visit review using our clinic review tool, if applicable. No additional management support is needed unless otherwise documented below in the visit note. 

## 2016-01-16 NOTE — Assessment & Plan Note (Signed)
He is having a COPD exacerbation, he tells me he is using the [facility] inhaler as directed. He was not willing to be admitted today for more aggressive treatment. I therefore, gave him an injection of Depo-Medrol to decrease the inflammation and to improve his COPD symptoms.

## 2016-01-23 ENCOUNTER — Ambulatory Visit (INDEPENDENT_AMBULATORY_CARE_PROVIDER_SITE_OTHER): Payer: Medicare Other | Admitting: Internal Medicine

## 2016-01-23 ENCOUNTER — Ambulatory Visit (INDEPENDENT_AMBULATORY_CARE_PROVIDER_SITE_OTHER)
Admission: RE | Admit: 2016-01-23 | Discharge: 2016-01-23 | Disposition: A | Discharge status: Home / Self Care | Payer: Medicare Other | Source: Ambulatory Visit | Attending: Internal Medicine | Admitting: Internal Medicine

## 2016-01-23 ENCOUNTER — Encounter: Payer: Self-pay | Admitting: Internal Medicine

## 2016-01-23 ENCOUNTER — Other Ambulatory Visit (INDEPENDENT_AMBULATORY_CARE_PROVIDER_SITE_OTHER): Payer: Medicare Other

## 2016-01-23 VITALS — BP 110/70 | HR 100 | Temp 98.2°F | Resp 16 | Ht 70.5 in | Wt 202.0 lb

## 2016-01-23 DIAGNOSIS — I10 Essential (primary) hypertension: Secondary | ICD-10-CM | POA: Diagnosis not present

## 2016-01-23 DIAGNOSIS — Z23 Encounter for immunization: Secondary | ICD-10-CM | POA: Diagnosis not present

## 2016-01-23 DIAGNOSIS — J189 Pneumonia, unspecified organism: Secondary | ICD-10-CM | POA: Diagnosis not present

## 2016-01-23 DIAGNOSIS — R739 Hyperglycemia, unspecified: Secondary | ICD-10-CM

## 2016-01-23 LAB — COMPREHENSIVE METABOLIC PANEL
ALBUMIN: 4.3 g/dL (ref 3.5–5.2)
ALK PHOS: 44 U/L (ref 39–117)
ALT: 20 U/L (ref 0–53)
AST: 22 U/L (ref 0–37)
BILIRUBIN TOTAL: 0.4 mg/dL (ref 0.2–1.2)
BUN: 21 mg/dL (ref 6–23)
CALCIUM: 9.4 mg/dL (ref 8.4–10.5)
CO2: 30 meq/L (ref 19–32)
CREATININE: 1.5 mg/dL (ref 0.40–1.50)
Chloride: 98 mEq/L (ref 96–112)
GFR: 48.56 mL/min — AB (ref >60.00)
Glucose, Bld: 114 mg/dL — ABNORMAL HIGH (ref 70–99)
Potassium: 4 mEq/L (ref 3.5–5.1)
Sodium: 136 mEq/L (ref 135–145)
TOTAL PROTEIN: 7.7 g/dL (ref 6.0–8.3)

## 2016-01-23 LAB — CBC WITH DIFFERENTIAL/PLATELET
BASOS ABS: 0 10*3/uL (ref 0.0–0.1)
Basophils Relative: 0.4 % (ref 0.0–3.0)
Eosinophils Absolute: 0.1 10*3/uL (ref 0.0–0.7)
Eosinophils Relative: 0.8 % (ref 0.0–5.0)
HEMATOCRIT: 46.4 % (ref 39.0–52.0)
HEMOGLOBIN: 15.9 g/dL (ref 13.0–17.0)
LYMPHS PCT: 23.4 % (ref 12.0–46.0)
Lymphs Abs: 2.1 10*3/uL (ref 0.7–4.0)
MCHC: 34.3 g/dL (ref 30.0–36.0)
MCV: 96.5 fl (ref 78.0–100.0)
MONOS PCT: 5.7 % (ref 3.0–12.0)
Monocytes Absolute: 0.5 10*3/uL (ref 0.1–1.0)
NEUTROS ABS: 6.4 10*3/uL (ref 1.4–7.7)
Neutrophils Relative %: 69.7 % (ref 43.0–77.0)
PLATELETS: 345 10*3/uL (ref 150.0–400.0)
RBC: 4.8 Mil/uL (ref 4.22–5.81)
RDW: 14.3 % (ref 11.5–15.5)
WBC: 9.1 10*3/uL (ref 4.0–10.5)

## 2016-01-23 LAB — HEMOGLOBIN A1C: Hgb A1c MFr Bld: 5.6 % (ref 4.6–6.5)

## 2016-01-23 NOTE — Progress Notes (Signed)
Pre visit review using our clinic review tool, if applicable. No additional management support is needed unless otherwise documented below in the visit note. 

## 2016-01-23 NOTE — Progress Notes (Signed)
Subjective:  Patient ID: Edwin [name], male    DOB: [birth year]  Age: 75 y.o. MRN: [id number]  CC: Cough   HPI Edwin [name] presents for follow-up on pneumonia and recent COPD exacerbation. He tells me he feels much better. He has a mild, intermittent cough that is really productive of clear to yellow phlegm. He denies hemoptysis, night sweats, fever, chills, shortness of breath, or wheezing. He has had a few episodes of weakness and dizziness.  Outpatient Medications Prior to Visit  Medication Sig Dispense Refill  . acetaminophen (TYLENOL) 325 MG tablet Take 650 mg by mouth every 6 (six) hours as needed for mild pain.    [name] Kitchen albuterol (PROVENTIL HFA) 108 (90 BASE) MCG/ACT inhaler Inhale 2 puffs into the lungs every 4 (four) hours as needed for wheezing or shortness of breath. 1-2 puffs every 4 hrs as needed. 1 Inhaler 0  . allopurinol (ZYLOPRIM) 300 MG tablet TAKE 1 TABLET (300 MG TOTAL) BY MOUTH DAILY. 90 tablet 1  . aspirin EC 81 MG tablet Take 81 mg by mouth daily.    . bimatoprost (LUMIGAN) 0.03 % ophthalmic solution Place 1 drop into both eyes at bedtime.      . brimonidine (ALPHAGAN P) 0.1 % SOLN 1 drop 2 (two) times daily. Both eyes     . DEXILANT 60 MG capsule TAKE 1 CAPSULE (60 MG TOTAL) BY MOUTH DAILY. 90 capsule 0  . dextromethorphan-guaiFENesin (MUCINEX DM) 30-600 MG 12hr tablet Take 1 tablet by mouth daily as needed for cough.     . EPINEPHrine 0.3 mg/0.3 mL IJ SOAJ injection Inject 0.3 mLs (0.3 mg total) into the muscle once. 2 Device 1  . famotidine (PEPCID) 20 MG tablet One at bedtime    . FLUoxetine (PROZAC) 10 MG capsule TAKE ONE CAPSULE BY MOUTH ONE TIME DAILY. 90 capsule 3  . fluticasone furoate-vilanterol (BREO ELLIPTA) 200-25 MCG/INH AEPB Inhale 1 puff into the lungs daily. 60 each 5  . ipratropium-albuterol (DUONEB) 0.5-2.5 (3) MG/3ML SOLN Take 3 mLs by nebulization every 4 (four) hours as needed. 300 mL 5  . levofloxacin (LEVAQUIN) 750 MG tablet Take 1 tablet  (750 mg total) by mouth daily. 10 tablet 0  . montelukast (SINGULAIR) 10 MG tablet Take 1 tablet (10 mg total) by mouth at bedtime. 30 tablet 0  . NASONEX 50 MCG/ACT nasal spray place 2 sprays into each nostril daily as needed 17 g 4  . olmesartan-hydrochlorothiazide (BENICAR HCT) 40-12.5 MG tablet Take 0.5 tablets by mouth daily. 30 tablet 3  . omalizumab (XOLAIR) 150 MG injection Inject 150 mg into the skin every 14 (fourteen) days.    [name] Kitchen psyllium (METAMUCIL) 58.6 % powder Take 1 packet by mouth daily.      [name] Kitchen umeclidinium bromide (INCRUSE ELLIPTA) 62.5 MCG/INH AEPB Inhale 1 puff into the lungs daily. 30 each 5  . Glucosamine-Chondroit-Vit C-Mn (GLUCOSAMINE 1500 COMPLEX PO) Take 2 tablets by mouth every evening.      Facility-Administered Medications Prior to Visit  Medication Dose Route Frequency Provider Last Rate Last Dose  . omalizumab Arvid Right) injection 375 mg  375 mg Subcutaneous Q14 Days Jiles Prows, MD   375 mg at 12/25/15 1503    ROS Review of Systems  Constitutional: Negative for activity change, appetite change, chills, diaphoresis, fatigue and fever.  HENT: Negative.  Negative for sinus pressure and trouble swallowing.   Eyes: Negative.   Respiratory: Positive for cough. Negative for choking, chest tightness, shortness of breath, wheezing  and stridor.   Cardiovascular: Negative for chest pain, palpitations and leg swelling.  Gastrointestinal: Negative.  Negative for abdominal pain, constipation, diarrhea, nausea and vomiting.  Endocrine: Negative.   Genitourinary: Negative.  Negative for difficulty urinating.  Musculoskeletal: Negative.  Negative for arthralgias, back pain, joint swelling and myalgias.  Skin: Negative.  Negative for color change and rash.  Allergic/Immunologic: Negative.   Neurological: Positive for dizziness and weakness. Negative for syncope, light-headedness, numbness and headaches.  Hematological: Negative.  Negative for adenopathy. Does not bruise/bleed  easily.  Psychiatric/Behavioral: Negative.     Objective:  BP 110/70 (BP Location: Left Arm, Patient Position: Sitting, Cuff Size: Normal)   Pulse 100   Temp 98.2 F (36.8 C) (Oral)   Resp 16   Ht 5' 10.5" (1.791 m)   Wt 202 lb (91.6 kg)   SpO2 95%   BMI 28.57 kg/m   BP Readings from Last 3 Encounters:  01/23/16 110/70  01/15/16 (!) 178/90  01/08/16 (!) 154/83    Wt Readings from Last 3 Encounters:  01/23/16 202 lb (91.6 kg)  01/15/16 206 lb 5 oz (93.6 kg)  01/07/16 215 lb 2.7 oz (97.6 kg)    Physical Exam  Constitutional: He is oriented to person, place, and time.  Non-toxic appearance. He does not have a sickly appearance. He does not appear ill. No distress.  HENT:  Mouth/Throat: Oropharynx is clear and moist. No oropharyngeal exudate.  Eyes: Conjunctivae are normal. Right eye exhibits no discharge. Left eye exhibits no discharge. No scleral icterus.  Neck: Normal range of motion. Neck supple. No JVD present. No tracheal deviation present. No thyromegaly present.  Cardiovascular: Normal rate, regular rhythm, normal heart sounds and intact distal pulses.  Exam reveals no gallop and no friction rub.   No murmur heard. Pulmonary/Chest: Effort normal. No accessory muscle usage or stridor. No tachypnea. No respiratory distress. He has no decreased breath sounds. He has no wheezes. He has no rhonchi. He has rales in the right lower field and the left lower field. He exhibits no tenderness.  Abdominal: Soft. Bowel sounds are normal. He exhibits no distension and no mass. There is no tenderness. There is no rebound and no guarding.  Musculoskeletal: Normal range of motion. He exhibits no edema, tenderness or deformity.  Lymphadenopathy:    He has no cervical adenopathy.  Neurological: He is oriented to person, place, and time.  Skin: Skin is warm and dry. No rash noted. He is not diaphoretic. No erythema. No pallor.  Vitals reviewed.   Lab Results  Component Value Date    WBC 9.1 01/23/2016   HGB 15.9 01/23/2016   HCT 46.4 01/23/2016   PLT 345.0 01/23/2016   GLUCOSE 114 (H) 01/23/2016   CHOL 145 12/26/2014   TRIG 233.0 (H) 12/26/2014   HDL 42.90 12/26/2014   LDLDIRECT 78.0 12/26/2014   LDLCALC 84 10/03/2013   ALT 20 01/23/2016   AST 22 01/23/2016   NA 136 01/23/2016   K 4.0 01/23/2016   CL 98 01/23/2016   CREATININE 1.50 01/23/2016   BUN 21 01/23/2016   CO2 30 01/23/2016   TSH 1.78 04/17/2015   PSA 1.17 01/08/2015   HGBA1C 5.6 01/23/2016    Dg Chest 2 View  Result Date: 01/15/2016 CLINICAL DATA:  Follow-up pneumonia.  Cough, congestion continues. EXAM: CHEST  2 VIEW COMPARISON:  01/06/2016 chest x-ray hand CT FINDINGS: Previously seen patchy opacity at the left base has improved. New patchy opacities throughout the right lower lobe and  right middle lobe concerning for pneumonia. Heart is normal size. No effusions or acute bony abnormality. IMPRESSION: Improving opacity at the left base. New patchy opacity within the right mid and lower lung concerning for pneumonia. Electronically Signed   By: Rolm Baptise M.D.   On: 01/15/2016 16:23    Assessment & Plan:   Daaiel was seen today for cough.  Diagnoses and all orders for this visit:  CAP (community acquired pneumonia)- He has had a significant improvement clinically based on symptoms and physical examination, his chest x-ray is unchanged but it is still early in the process so at this time I will not order an additional diagnostic study such as a CT scan. He will complete the course of Levaquin and I will recheck him in 3-4 weeks and will consider an imaging study of the chest at that time. -     DG Chest 2 View; Future  Need for prophylactic vaccination against Streptococcus pneumoniae (pneumococcus) -     Pneumococcal conjugate vaccine 13-valent  Need for prophylactic vaccination with combined diphtheria-tetanus-pertussis (DTP) vaccine -     Tdap vaccine greater than or equal to 7yo  IM  Other orders -     Cancel: Flu Vaccine QUAD 36+ mos IM   I have discontinued Mr. Notte's Glucosamine-Chondroit-Vit C-Mn (GLUCOSAMINE 1500 COMPLEX PO) and LUMIGAN. I am also having him maintain his bimatoprost, brimonidine, psyllium, albuterol, NASONEX, FLUoxetine, famotidine, dextromethorphan-guaiFENesin, allopurinol, umeclidinium bromide, ipratropium-albuterol, DEXILANT, EPINEPHrine, aspirin EC, acetaminophen, omalizumab, fluticasone furoate-vilanterol, montelukast, olmesartan-hydrochlorothiazide, and levofloxacin. We will continue to administer omalizumab.  Meds ordered this encounter  Medications  . DISCONTD: LUMIGAN 0.01 % SOLN     Follow-up: Return in about 4 weeks (around 02/20/2016).  Scarlette Calico, MD

## 2016-01-23 NOTE — Patient Instructions (Signed)

## 2016-02-03 ENCOUNTER — Other Ambulatory Visit: Payer: Self-pay | Admitting: Internal Medicine

## 2016-02-12 ENCOUNTER — Encounter: Payer: Self-pay | Admitting: Allergy and Immunology

## 2016-02-12 ENCOUNTER — Ambulatory Visit (INDEPENDENT_AMBULATORY_CARE_PROVIDER_SITE_OTHER): Payer: Medicare Other | Admitting: Allergy and Immunology

## 2016-02-12 VITALS — BP 134/78 | HR 72 | Resp 16

## 2016-02-12 DIAGNOSIS — J387 Other diseases of larynx: Secondary | ICD-10-CM

## 2016-02-12 DIAGNOSIS — J45909 Unspecified asthma, uncomplicated: Secondary | ICD-10-CM

## 2016-02-12 DIAGNOSIS — J4540 Moderate persistent asthma, uncomplicated: Secondary | ICD-10-CM

## 2016-02-12 DIAGNOSIS — K219 Gastro-esophageal reflux disease without esophagitis: Secondary | ICD-10-CM

## 2016-02-12 DIAGNOSIS — J449 Chronic obstructive pulmonary disease, unspecified: Secondary | ICD-10-CM | POA: Diagnosis not present

## 2016-02-12 DIAGNOSIS — Z8701 Personal history of pneumonia (recurrent): Secondary | ICD-10-CM | POA: Diagnosis not present

## 2016-02-12 DIAGNOSIS — J3089 Other allergic rhinitis: Secondary | ICD-10-CM

## 2016-02-12 MED ORDER — ACYCLOVIR 200 MG PO CAPS: 200.0000 mg | ORAL_CAPSULE | Freq: Four times a day (QID) | ORAL | 2 refills | Status: DC

## 2016-02-12 NOTE — Patient Instructions (Signed)
  1. Continue Xolair and EpiPen  2. Continue to Treat inflammation:   A. Continue Breo 200 one inhalation one time per day  B. Continue Nasonex 1 spray each nostril one time per day  C. Continue montelukast 10 mg one tablet one time per day    3. Continue to Treat reflux:   A. Dexilant 60 mg in the a.m.  B. Pepcid 20 mg tablet 2 tablets in the p.m.  C. Continue off all caffeine and chocolate  4. If needed:   A. ProAir HFA 2 puffs every 4-6 hours  B. nasal saline washes  C. OTC antihistamine  D. Mucinex DM 2 tablets twice a day  E. Duoneb nebulized every 4-6 hours if needed.  5. Return to clinic in 12 weeks or earlier if problem  6. Obtain fall flu vaccine

## 2016-02-12 NOTE — Progress Notes (Signed)
Follow-up Note  Referring Provider: Janith Lima, MD Primary Provider: Scarlette Calico, MD Date of Office Visit: 02/12/2016  Subjective:   Edwin [name] (DOB: 01-May-1941) is a 75 y.o. male who returns to [facility] on 02/12/2016 in re-evaluation of the following:  HPI: Edwin [name] presents to this clinic in evaluation of his COPD with component of asthma, allergic rhinitis, and reflux. I have not seen him in his clinic since 20 November 2015. Apparently with relatively quick onset he developed shortness of breath and chills and was evaluated in the emergency room setting and found to have a multilobular pneumonia treated with antibiotics and systemic steroids with good result. He's now pretty much back to baseline which is defined by some chest congestion and coughing on a regular basis and a requirement for short acting bronchodilator a few times a week while consistently using anti-inflammatory medications in the form of inhaled steroids and a leukotriene modifier and nasal steroids and omalizumab. He has only had 2 injections of omalizumab as of today and missed his subsequent injections because of his pneumonia. He did receive the pertussis vaccine and the pneumonia vaccine during his hospitalization.    Medication List      acetaminophen 325 MG tablet Commonly known as:  TYLENOL Take 650 mg by mouth every 6 (six) hours as needed for mild pain.   albuterol 108 (90 Base) MCG/ACT inhaler Commonly known as:  PROVENTIL HFA Inhale 2 puffs into the lungs every 4 (four) hours as needed for wheezing or shortness of breath. 1-2 puffs every 4 hrs as needed.   allopurinol 300 MG tablet Commonly known as:  ZYLOPRIM TAKE 1 TABLET (300 MG TOTAL) BY MOUTH DAILY.   aspirin EC 81 MG tablet Take 81 mg by mouth daily.   bimatoprost 0.03 % ophthalmic solution Commonly known as:  LUMIGAN Place 1 drop into both eyes at bedtime.   brimonidine 0.1 % Soln Commonly known as:   ALPHAGAN P 1 drop 2 (two) times daily. Both eyes   DEXILANT 60 MG capsule Generic drug:  dexlansoprazole TAKE 1 CAPSULE (60 MG TOTAL) BY MOUTH DAILY.   dextromethorphan-guaiFENesin 30-600 MG 12hr tablet Commonly known as:  MUCINEX DM Take 1 tablet by mouth daily as needed for cough.   EPINEPHrine 0.3 mg/0.3 mL Soaj injection Commonly known as:  EPI-PEN Inject 0.3 mLs (0.3 mg total) into the muscle once.   famotidine 20 MG tablet Commonly known as:  PEPCID One at bedtime   FLUoxetine 10 MG capsule Commonly known as:  PROZAC TAKE ONE CAPSULE BY MOUTH ONE TIME DAILY.   fluticasone furoate-vilanterol 200-25 MCG/INH Aepb Commonly known as:  BREO ELLIPTA Inhale 1 puff into the lungs daily.   ipratropium-albuterol 0.5-2.5 (3) MG/3ML Soln Commonly known as:  DUONEB Take 3 mLs by nebulization every 4 (four) hours as needed.   montelukast 10 MG tablet Commonly known as:  SINGULAIR Take 1 tablet (10 mg total) by mouth at bedtime.   NASONEX 50 MCG/ACT nasal spray Generic drug:  mometasone place 2 sprays into each nostril daily as needed   olmesartan-hydrochlorothiazide 40-12.5 MG tablet Commonly known as:  BENICAR HCT Take 0.5 tablets by mouth daily.   psyllium 58.6 % powder Commonly known as:  METAMUCIL Take 1 packet by mouth daily.   XOLAIR 150 MG injection Generic drug:  omalizumab Inject 150 mg into the skin every 14 (fourteen) days.       Past Medical History:  Diagnosis Date  .  Asthma   . COPD (chronic obstructive pulmonary disease) (HCC)    reactive airway disease  . Glaucoma   . Gout   . Hx of colonic polyps    Dr Edwin [name]  . Hyperlipidemia    NMR lipoprofile 2005: LDL 113(1416/825), HLD 37, TG 190.  [name] Kitchen Hypertension   . Pneumonia     Past Surgical History:  Procedure Laterality Date  . CATARACT EXTRACTION     bilat, implants  . CHOLECYSTECTOMY  1995  . LUMBAR LAMINECTOMY  1982  . POLYPECTOMY  2003    neg 2006, Dr.Medoff  . tear duct surgery  1998     for excess tearing, skin graft L foot @ age 35 1/2    Allergies  Allergen Reactions  . Ramipril     REACTION: chest congestion & cough. No angioedema    Review of systems negative except as noted in HPI / PMHx or noted below:  Review of Systems  Constitutional: Negative.   HENT: Negative.   Eyes: Negative.   Respiratory: Negative.   Cardiovascular: Negative.   Gastrointestinal: Negative.   Genitourinary: Negative.   Musculoskeletal: Negative.   Skin: Negative.   Neurological: Negative.   Endo/Heme/Allergies: Negative.   Psychiatric/Behavioral: Negative.      Objective:   Vitals:   02/12/16 1709  BP: 134/78  Pulse: 72  Resp: 16          Physical Exam  Constitutional: He is well-developed, well-nourished, and in no distress.  HENT:  Head: Normocephalic.  Right Ear: Tympanic membrane, external ear and ear canal normal.  Left Ear: Tympanic membrane, external ear and ear canal normal.  Nose: Nose normal. No mucosal edema or rhinorrhea.  Mouth/Throat: Uvula is midline, oropharynx is clear and moist and mucous membranes are normal. No oropharyngeal exudate.  Eyes: Conjunctivae are normal.  Neck: Trachea normal. No tracheal tenderness present. No tracheal deviation present. No thyromegaly present.  Cardiovascular: Normal rate, regular rhythm, S1 normal, S2 normal and normal heart sounds.   No murmur heard. Pulmonary/Chest: No stridor. No respiratory distress. He has wheezes (End expiratory wheezing heard in posterior lung Edwin [name] on forced expiration). He has no rales.  Musculoskeletal: He exhibits no edema.  Lymphadenopathy:       Head (right side): No tonsillar adenopathy present.       Head (left side): No tonsillar adenopathy present.    He has no cervical adenopathy.  Neurological: He is alert. Gait normal.  Skin: No rash noted. He is not diaphoretic. No erythema. Nails show no clubbing.  Psychiatric: Mood and affect normal.    Diagnostics: Results of a  chest CT scan obtained 01/06/2016 identified patchy infiltrates in the lingula and left and right lower lobes.  Results of an echocardiogram obtained on 01/08/2016 identified an ejection fraction of 123456, grade 1 diastolic dysfunction, trivial tricuspid regurgitation, and pulmonary artery peak pressure of 38.   Spirometry was performed and demonstrated an FEV1 of 2.01 at 61 % of predicted.  Assessment and Plan:   1. COPD with asthma ([facility])   2. Other allergic rhinitis   3. LPRD (laryngopharyngeal reflux disease)   4. History of pneumonia     1. Continue Xolair and EpiPen  2. Continue to Treat inflammation:   A. Continue Breo 200 one inhalation one time per day  B. Continue Nasonex 1 spray each nostril one time per day  C. Continue montelukast 10 mg one tablet one time per day    3. Continue to Treat reflux:  A. Dexilant 60 mg in the a.m.  B. Pepcid 20 mg tablet 2 tablets in the p.m.  C. Continue off all caffeine and chocolate  4. If needed:   A. ProAir HFA 2 puffs every 4-6 hours  B. nasal saline washes  C. OTC antihistamine  D. Mucinex DM 2 tablets twice a day  E. Duoneb nebulized every 4-6 hours if needed.  5. Return to clinic in 12 weeks or earlier if problem  6. Obtain fall flu vaccine  I will have Roma continue to use anti-inflammatory agents for his respiratory tract as noted above as well as therapy directed against reflux and plan on seeing him back in this clinic in 12 weeks or earlier if there is a problem. He will contact me sooner should there be significant problems as he moves forward.  Allena Katz, MD [facility] [place]

## 2016-02-13 ENCOUNTER — Encounter: Payer: Self-pay | Admitting: *Deleted

## 2016-02-13 DIAGNOSIS — J3089 Other allergic rhinitis: Secondary | ICD-10-CM | POA: Diagnosis not present

## 2016-02-13 DIAGNOSIS — J449 Chronic obstructive pulmonary disease, unspecified: Secondary | ICD-10-CM | POA: Diagnosis not present

## 2016-02-13 DIAGNOSIS — Z8701 Personal history of pneumonia (recurrent): Secondary | ICD-10-CM | POA: Diagnosis not present

## 2016-02-13 DIAGNOSIS — J45909 Unspecified asthma, uncomplicated: Secondary | ICD-10-CM | POA: Diagnosis not present

## 2016-02-13 DIAGNOSIS — J387 Other diseases of larynx: Secondary | ICD-10-CM | POA: Diagnosis not present

## 2016-02-13 DIAGNOSIS — J4540 Moderate persistent asthma, uncomplicated: Secondary | ICD-10-CM | POA: Diagnosis not present

## 2016-02-13 NOTE — Progress Notes (Signed)
This encounter was created in error - please disregard.

## 2016-02-21 ENCOUNTER — Other Ambulatory Visit: Payer: Self-pay | Admitting: Internal Medicine

## 2016-02-27 ENCOUNTER — Encounter: Payer: Medicare Other | Admitting: Internal Medicine

## 2016-03-03 ENCOUNTER — Ambulatory Visit (INDEPENDENT_AMBULATORY_CARE_PROVIDER_SITE_OTHER): Payer: Medicare Other | Admitting: Internal Medicine

## 2016-03-03 ENCOUNTER — Encounter: Payer: Self-pay | Admitting: Internal Medicine

## 2016-03-03 ENCOUNTER — Other Ambulatory Visit (INDEPENDENT_AMBULATORY_CARE_PROVIDER_SITE_OTHER): Payer: Medicare Other

## 2016-03-03 VITALS — BP 140/80 | HR 93 | Temp 98.0°F | Ht 70.5 in | Wt 211.0 lb

## 2016-03-03 DIAGNOSIS — N400 Enlarged prostate without lower urinary tract symptoms: Secondary | ICD-10-CM | POA: Diagnosis not present

## 2016-03-03 DIAGNOSIS — Z0000 Encounter for general adult medical examination without abnormal findings: Secondary | ICD-10-CM

## 2016-03-03 DIAGNOSIS — E785 Hyperlipidemia, unspecified: Secondary | ICD-10-CM | POA: Diagnosis not present

## 2016-03-03 DIAGNOSIS — M1000 Idiopathic gout, unspecified site: Secondary | ICD-10-CM

## 2016-03-03 DIAGNOSIS — Z23 Encounter for immunization: Secondary | ICD-10-CM

## 2016-03-03 DIAGNOSIS — R972 Elevated prostate specific antigen [PSA]: Secondary | ICD-10-CM | POA: Diagnosis not present

## 2016-03-03 DIAGNOSIS — R7989 Other specified abnormal findings of blood chemistry: Secondary | ICD-10-CM

## 2016-03-03 DIAGNOSIS — J4540 Moderate persistent asthma, uncomplicated: Secondary | ICD-10-CM

## 2016-03-03 LAB — LIPID PANEL
CHOLESTEROL: 187 mg/dL (ref 0–200)
HDL: 50.1 mg/dL (ref >39.00)
NonHDL: 136.75
Total CHOL/HDL Ratio: 4
Triglycerides: 256 mg/dL — ABNORMAL HIGH (ref 0.0–149.0)
VLDL: 51.2 mg/dL — ABNORMAL HIGH (ref 0.0–40.0)

## 2016-03-03 LAB — PSA: PSA: 4.23 ng/mL — ABNORMAL HIGH (ref 0.10–4.00)

## 2016-03-03 LAB — LDL CHOLESTEROL, DIRECT: Direct LDL: 102 mg/dL

## 2016-03-03 LAB — TSH: TSH: 1.43 u[IU]/mL (ref 0.35–4.50)

## 2016-03-03 NOTE — Progress Notes (Signed)
Subjective:  Patient ID: [name], male    DOB: Sep 19, 1940  Age: 75 y.o. MRN: [id number]  CC: Annual Exam; Hyperlipidemia; and Hypertension   HPI Rilley Waite Zebrowski presents for AWV/CPX.  He tells me that his cough and shortness of breath are getting better. Since I last saw him he saw his allergist and has been started on Breo. He tells me the cough is productive of only clear phlegm. He denies chest pain, hemoptysis, wheezing, fever, chills, night sweats, or weight loss.  Past Medical History:  Diagnosis Date  . Asthma   . COPD (chronic obstructive pulmonary disease) (HCC)    reactive airway disease  . Glaucoma   . Gout   . Hx of colonic polyps    Dr Earlean Shawl  . Hyperlipidemia    NMR lipoprofile 2005: LDL 113(1416/825), HLD 37, TG 190.  [name] Kitchen Hypertension   . Pneumonia    Past Surgical History:  Procedure Laterality Date  . CATARACT EXTRACTION     bilat, implants  . CHOLECYSTECTOMY  1995  . LUMBAR LAMINECTOMY  1982  . POLYPECTOMY  2003    neg 2006, Dr.Medoff  . tear duct surgery  1998   for excess tearing, skin graft L foot @ age 54 1/2    reports that he quit smoking about 32 years ago. His smoking use included Cigarettes. He has a 50.00 pack-year smoking history. He has never used smokeless tobacco. He reports that he does not drink alcohol or use drugs. family history includes Asthma in his father and son; Cancer in his mother; Emphysema in his father; Heart attack (age of onset: 74) in his father; Hypertension in his mother. Allergies  Allergen Reactions  . Ramipril     REACTION: chest congestion & cough. No angioedema    Outpatient Medications Prior to Visit  Medication Sig Dispense Refill  . acetaminophen (TYLENOL) 325 MG tablet Take 650 mg by mouth every 6 (six) hours as needed for mild pain.    [name] Kitchen acyclovir (ZOVIRAX) 200 MG capsule Take 1 capsule (200 mg total) by mouth 4 (four) times daily. 40 capsule 2  . albuterol (PROVENTIL HFA) 108 (90 BASE) MCG/ACT  inhaler Inhale 2 puffs into the lungs every 4 (four) hours as needed for wheezing or shortness of breath. 1-2 puffs every 4 hrs as needed. 1 Inhaler 0  . allopurinol (ZYLOPRIM) 300 MG tablet TAKE 1 TABLET (300 MG TOTAL) BY MOUTH DAILY. 90 tablet 1  . aspirin EC 81 MG tablet Take 81 mg by mouth daily.    . bimatoprost (LUMIGAN) 0.03 % ophthalmic solution Place 1 drop into both eyes at bedtime.      . brimonidine (ALPHAGAN P) 0.1 % SOLN 1 drop 2 (two) times daily. Both eyes     . DEXILANT 60 MG capsule TAKE 1 CAPSULE (60 MG TOTAL) BY MOUTH DAILY. 90 capsule 0  . dextromethorphan-guaiFENesin (MUCINEX DM) 30-600 MG 12hr tablet Take 1 tablet by mouth daily as needed for cough.     . EPINEPHrine 0.3 mg/0.3 mL IJ SOAJ injection Inject 0.3 mLs (0.3 mg total) into the muscle once. 2 Device 1  . famotidine (PEPCID) 20 MG tablet One at bedtime    . FLUoxetine (PROZAC) 10 MG capsule Take 1 capsule (10 mg total) by mouth daily. 90 capsule 1  . fluticasone furoate-vilanterol (BREO ELLIPTA) 200-25 MCG/INH AEPB Inhale 1 puff into the lungs daily. 60 each 5  . ipratropium-albuterol (DUONEB) 0.5-2.5 (3) MG/3ML SOLN Take 3 mLs by  nebulization every 4 (four) hours as needed. 300 mL 5  . montelukast (SINGULAIR) 10 MG tablet Take 1 tablet (10 mg total) by mouth at bedtime. 30 tablet 0  . NASONEX 50 MCG/ACT nasal spray place 2 sprays into each nostril daily as needed 17 g 4  . olmesartan-hydrochlorothiazide (BENICAR HCT) 40-12.5 MG tablet Take 0.5 tablets by mouth daily. 30 tablet 3  . omalizumab (XOLAIR) 150 MG injection Inject 150 mg into the skin every 14 (fourteen) days.    [name] Kitchen psyllium (METAMUCIL) 58.6 % powder Take 1 packet by mouth daily.       Facility-Administered Medications Prior to Visit  Medication Dose Route Frequency Provider Last Rate Last Dose  . omalizumab Arvid Right) injection 375 mg  375 mg Subcutaneous Q14 Days Jiles Prows, MD   375 mg at 03/04/16 1128    ROS Review of Systems  Constitutional:  Negative for activity change, appetite change, chills, diaphoresis, fatigue, fever and unexpected weight change.  HENT: Negative for sinus pressure, sore throat, trouble swallowing and voice change.   Eyes: Negative.  Negative for visual disturbance.  Respiratory: Positive for cough and shortness of breath. Negative for apnea, choking, chest tightness, wheezing and stridor.   Cardiovascular: Negative.  Negative for chest pain, palpitations and leg swelling.  Gastrointestinal: Negative.  Negative for abdominal pain, constipation, diarrhea, nausea and vomiting.  Endocrine: Negative.   Genitourinary: Negative.  Negative for difficulty urinating, dysuria and frequency.  Musculoskeletal: Negative.  Negative for back pain and myalgias.  Skin: Negative.   Allergic/Immunologic: Negative.   Neurological: Negative.  Negative for dizziness, tremors, syncope, weakness and light-headedness.  Hematological: Negative for adenopathy. Does not bruise/bleed easily.  Psychiatric/Behavioral: Negative.     Objective:  BP 140/80 (BP Location: Left Arm, Patient Position: Sitting, Cuff Size: Normal)   Pulse 93   Temp 98 F (36.7 C) (Oral)   Ht 5' 10.5" (1.791 m)   Wt 211 lb (95.7 kg)   SpO2 95%   BMI 29.85 kg/m   BP Readings from Last 3 Encounters:  03/03/16 140/80  02/12/16 134/78  01/23/16 110/70    Wt Readings from Last 3 Encounters:  03/03/16 211 lb (95.7 kg)  01/23/16 202 lb (91.6 kg)  01/15/16 206 lb 5 oz (93.6 kg)    Physical Exam  Constitutional: He is oriented to person, place, and time. He appears well-developed and well-nourished. No distress.  HENT:  Head: Normocephalic and atraumatic.  Mouth/Throat: Oropharynx is clear and moist. No oropharyngeal exudate.  Eyes: Conjunctivae are normal. Right eye exhibits no discharge. Left eye exhibits no discharge. No scleral icterus.  Neck: Normal range of motion. Neck supple. No JVD present. No tracheal deviation present. No thyromegaly present.   Cardiovascular: Normal rate, regular rhythm, normal heart sounds and intact distal pulses.  Exam reveals no gallop and no friction rub.   No murmur heard. Pulmonary/Chest: Effort normal and breath sounds normal. No stridor. No respiratory distress. He has no wheezes. He has no rales. He exhibits no tenderness.  Abdominal: Soft. Bowel sounds are normal. He exhibits no distension and no mass. There is no tenderness. There is no rebound and no guarding.  Genitourinary:  Genitourinary Comments: GU and rectal exams were deferred at his request  Musculoskeletal: Normal range of motion. He exhibits no edema, tenderness or deformity.  Lymphadenopathy:    He has no cervical adenopathy.  Neurological: He is oriented to person, place, and time.  Skin: Skin is warm and dry. No rash noted. He is  not diaphoretic. No erythema. No pallor.  Psychiatric: He has a normal mood and affect. His behavior is normal. Judgment and thought content normal.  Vitals reviewed.   Lab Results  Component Value Date   WBC 9.1 01/23/2016   HGB 15.9 01/23/2016   HCT 46.4 01/23/2016   PLT 345.0 01/23/2016   GLUCOSE 114 (H) 01/23/2016   CHOL 187 03/03/2016   TRIG 256.0 (H) 03/03/2016   HDL 50.10 03/03/2016   LDLDIRECT 102.0 03/03/2016   LDLCALC 84 10/03/2013   ALT 20 01/23/2016   AST 22 01/23/2016   NA 136 01/23/2016   K 4.0 01/23/2016   CL 98 01/23/2016   CREATININE 1.50 01/23/2016   BUN 21 01/23/2016   CO2 30 01/23/2016   TSH 1.43 03/03/2016   PSA 4.23 (H) 03/03/2016   HGBA1C 5.6 01/23/2016    Dg Chest 2 View  Result Date: 01/23/2016 CLINICAL DATA:  Follow-up of pneumonia; history of asthma -COPD. EXAM: CHEST  2 VIEW COMPARISON:  PA and lateral chest x-ray of January 15, 2016 FINDINGS: There is persistent interstitial and alveolar opacity in the right mid and lower lung. This has not significantly changed since the previous study. The lung markings elsewhere exhibit no significant abnormalities. The heart and  pulmonary vascularity are normal. The mediastinum is normal in width. The bony thorax exhibits no acute abnormality. IMPRESSION: Persistent interstitial and alveolar infiltrate in the right in the right middle and lower lung not significantly changed since January 15, 2016. No significant abnormality observed elsewhere. If the patient is improving clinically on therapy, a follow-up chest x-ray in 2-3 weeks would be useful. If the patient is a not improving or is deteriorating clinically, repeat chest CT scanning now would be useful. Electronically Signed   By: David  Martinique M.D.   On: 01/23/2016 15:02    Assessment & Plan:   Xaden was seen today for annual exam, hyperlipidemia and hypertension.  Diagnoses and all orders for this visit:  Idiopathic gout, unspecified chronicity, unspecified site- he has had no recent episodes of gouty arthropathy and has achieved his uric acid goal of less than 6, will continue allopurinol at 300 mg a day. -     Uric acid; Future  Hyperlipidemia with target LDL less than 130- his Framingham risk score is 16% so I've asked him to start taking a statin for risk reduction. -     Lipid panel; Future -     TSH; Future  Need for prophylactic vaccination and inoculation against influenza -     Flu vaccine HIGH DOSE PF (Fluzone High dose)  Benign prostatic hyperplasia without lower urinary tract symptoms- urology referral -     PSA; Future -     Ambulatory referral to Urology  Routine general medical examination at a health care facility  PSA elevation- his PSA has gone up significantly over the last year or so I've asked him to see a urologist for further evaluation. -     Ambulatory referral to Urology   I am having Mr. Lauf maintain his bimatoprost, brimonidine, psyllium, albuterol, NASONEX, famotidine, dextromethorphan-guaiFENesin, ipratropium-albuterol, DEXILANT, EPINEPHrine, aspirin EC, acetaminophen, omalizumab, fluticasone furoate-vilanterol,  montelukast, olmesartan-hydrochlorothiazide, allopurinol, acyclovir, and FLUoxetine. We will continue to administer omalizumab.  No orders of the defined types were placed in this encounter.  See AVS for instructions about healthy living and anticipatory guidance.  Follow-up: Return in about 6 months (around 09/01/2016).  Scarlette Calico, MD

## 2016-03-03 NOTE — Assessment & Plan Note (Signed)

## 2016-03-03 NOTE — Progress Notes (Signed)
Pre visit review using our clinic review tool, if applicable. No additional management support is needed unless otherwise documented below in the visit note. 

## 2016-03-03 NOTE — Patient Instructions (Signed)

## 2016-03-04 ENCOUNTER — Encounter: Payer: Self-pay | Admitting: Internal Medicine

## 2016-03-04 ENCOUNTER — Ambulatory Visit (INDEPENDENT_AMBULATORY_CARE_PROVIDER_SITE_OTHER): Payer: Medicare Other

## 2016-03-04 DIAGNOSIS — R972 Elevated prostate specific antigen [PSA]: Secondary | ICD-10-CM | POA: Insufficient documentation

## 2016-03-04 DIAGNOSIS — J4540 Moderate persistent asthma, uncomplicated: Secondary | ICD-10-CM

## 2016-03-04 LAB — URIC ACID: Uric Acid, Serum: 5.1 mg/dL (ref 4.0–7.8)

## 2016-03-04 MED ORDER — ATORVASTATIN CALCIUM 20 MG PO TABS: 20.0000 mg | ORAL_TABLET | Freq: Every day | ORAL | 3 refills | Status: DC

## 2016-03-06 ENCOUNTER — Other Ambulatory Visit: Payer: Self-pay | Admitting: Internal Medicine

## 2016-03-17 DIAGNOSIS — J4540 Moderate persistent asthma, uncomplicated: Secondary | ICD-10-CM | POA: Diagnosis not present

## 2016-03-18 ENCOUNTER — Ambulatory Visit (INDEPENDENT_AMBULATORY_CARE_PROVIDER_SITE_OTHER): Payer: Medicare Other | Admitting: *Deleted

## 2016-03-18 DIAGNOSIS — J4540 Moderate persistent asthma, uncomplicated: Secondary | ICD-10-CM

## 2016-03-31 DIAGNOSIS — J4540 Moderate persistent asthma, uncomplicated: Secondary | ICD-10-CM | POA: Diagnosis not present

## 2016-04-01 ENCOUNTER — Ambulatory Visit (INDEPENDENT_AMBULATORY_CARE_PROVIDER_SITE_OTHER): Payer: Medicare Other | Admitting: *Deleted

## 2016-04-01 DIAGNOSIS — J4540 Moderate persistent asthma, uncomplicated: Secondary | ICD-10-CM | POA: Diagnosis not present

## 2016-04-14 DIAGNOSIS — J4540 Moderate persistent asthma, uncomplicated: Secondary | ICD-10-CM

## 2016-04-15 ENCOUNTER — Ambulatory Visit (INDEPENDENT_AMBULATORY_CARE_PROVIDER_SITE_OTHER): Payer: Medicare Other | Admitting: *Deleted

## 2016-04-15 DIAGNOSIS — J4540 Moderate persistent asthma, uncomplicated: Secondary | ICD-10-CM

## 2016-04-21 DIAGNOSIS — H353111 Nonexudative age-related macular degeneration, right eye, early dry stage: Secondary | ICD-10-CM | POA: Diagnosis not present

## 2016-04-21 DIAGNOSIS — H524 Presbyopia: Secondary | ICD-10-CM | POA: Diagnosis not present

## 2016-04-21 DIAGNOSIS — H401132 Primary open-angle glaucoma, bilateral, moderate stage: Secondary | ICD-10-CM | POA: Diagnosis not present

## 2016-04-21 DIAGNOSIS — Z961 Presence of intraocular lens: Secondary | ICD-10-CM | POA: Diagnosis not present

## 2016-05-01 ENCOUNTER — Ambulatory Visit: Payer: Medicare Other

## 2016-05-01 DIAGNOSIS — J4540 Moderate persistent asthma, uncomplicated: Secondary | ICD-10-CM

## 2016-05-02 ENCOUNTER — Other Ambulatory Visit: Payer: Self-pay | Admitting: Internal Medicine

## 2016-05-02 ENCOUNTER — Ambulatory Visit (INDEPENDENT_AMBULATORY_CARE_PROVIDER_SITE_OTHER): Payer: Medicare Other | Admitting: *Deleted

## 2016-05-02 ENCOUNTER — Telehealth: Payer: Self-pay | Admitting: Emergency Medicine

## 2016-05-02 DIAGNOSIS — J4540 Moderate persistent asthma, uncomplicated: Secondary | ICD-10-CM | POA: Diagnosis not present

## 2016-05-02 MED ORDER — CEFDINIR 300 MG PO CAPS: 300.0000 mg | ORAL_CAPSULE | Freq: Two times a day (BID) | ORAL | 1 refills | Status: DC

## 2016-05-02 NOTE — Telephone Encounter (Signed)
Will inform pt of your advise.

## 2016-05-02 NOTE — Telephone Encounter (Signed)
Pt called and states he has a sinus infection. He has green mucous coming out of his nose and his chest. He stated Dr Ronnald Ramp wanted to keep an eye on him. Sometimes he just calls in an antibiotic and other times he wants to see him. Please advise thanks.

## 2016-05-02 NOTE — Telephone Encounter (Signed)
RX sent

## 2016-05-02 NOTE — Progress Notes (Signed)
This encounter was created in error - please disregard.

## 2016-05-03 NOTE — Telephone Encounter (Signed)
Pt informed that rx was sent  

## 2016-05-05 DIAGNOSIS — R972 Elevated prostate specific antigen [PSA]: Secondary | ICD-10-CM | POA: Diagnosis not present

## 2016-05-05 DIAGNOSIS — N400 Enlarged prostate without lower urinary tract symptoms: Secondary | ICD-10-CM | POA: Diagnosis not present

## 2016-05-05 LAB — PSA: PSA: 0.87

## 2016-05-08 ENCOUNTER — Encounter: Payer: Self-pay | Admitting: Internal Medicine

## 2016-05-08 NOTE — Progress Notes (Signed)
Abstracted and sent to scan  

## 2016-05-15 DIAGNOSIS — J4540 Moderate persistent asthma, uncomplicated: Secondary | ICD-10-CM

## 2016-05-16 ENCOUNTER — Ambulatory Visit (INDEPENDENT_AMBULATORY_CARE_PROVIDER_SITE_OTHER): Payer: Medicare Other | Admitting: *Deleted

## 2016-05-16 DIAGNOSIS — J4540 Moderate persistent asthma, uncomplicated: Secondary | ICD-10-CM | POA: Diagnosis not present

## 2016-05-28 ENCOUNTER — Other Ambulatory Visit: Payer: Self-pay | Admitting: Allergy and Immunology

## 2016-06-05 DIAGNOSIS — J4550 Severe persistent asthma, uncomplicated: Secondary | ICD-10-CM | POA: Diagnosis not present

## 2016-06-06 ENCOUNTER — Ambulatory Visit: Payer: Medicare Other

## 2016-06-06 ENCOUNTER — Ambulatory Visit (INDEPENDENT_AMBULATORY_CARE_PROVIDER_SITE_OTHER): Payer: Medicare Other

## 2016-06-06 DIAGNOSIS — J4550 Severe persistent asthma, uncomplicated: Secondary | ICD-10-CM | POA: Diagnosis not present

## 2016-06-06 DIAGNOSIS — J4540 Moderate persistent asthma, uncomplicated: Secondary | ICD-10-CM

## 2016-06-23 ENCOUNTER — Ambulatory Visit (INDEPENDENT_AMBULATORY_CARE_PROVIDER_SITE_OTHER): Payer: Medicare Other

## 2016-06-23 ENCOUNTER — Ambulatory Visit: Payer: Medicare Other

## 2016-06-23 DIAGNOSIS — J4540 Moderate persistent asthma, uncomplicated: Secondary | ICD-10-CM | POA: Diagnosis not present

## 2016-06-24 DIAGNOSIS — J4540 Moderate persistent asthma, uncomplicated: Secondary | ICD-10-CM | POA: Diagnosis not present

## 2016-07-04 DIAGNOSIS — J4540 Moderate persistent asthma, uncomplicated: Secondary | ICD-10-CM | POA: Diagnosis not present

## 2016-07-07 ENCOUNTER — Ambulatory Visit (INDEPENDENT_AMBULATORY_CARE_PROVIDER_SITE_OTHER): Payer: Medicare Other | Admitting: *Deleted

## 2016-07-07 DIAGNOSIS — J4540 Moderate persistent asthma, uncomplicated: Secondary | ICD-10-CM

## 2016-07-21 ENCOUNTER — Ambulatory Visit (INDEPENDENT_AMBULATORY_CARE_PROVIDER_SITE_OTHER): Payer: Medicare Other | Admitting: *Deleted

## 2016-07-21 DIAGNOSIS — J4540 Moderate persistent asthma, uncomplicated: Secondary | ICD-10-CM | POA: Diagnosis not present

## 2016-07-22 DIAGNOSIS — J4540 Moderate persistent asthma, uncomplicated: Secondary | ICD-10-CM | POA: Diagnosis not present

## 2016-08-01 DIAGNOSIS — J4540 Moderate persistent asthma, uncomplicated: Secondary | ICD-10-CM | POA: Diagnosis not present

## 2016-08-04 ENCOUNTER — Ambulatory Visit (INDEPENDENT_AMBULATORY_CARE_PROVIDER_SITE_OTHER): Payer: Medicare Other | Admitting: *Deleted

## 2016-08-04 DIAGNOSIS — J4540 Moderate persistent asthma, uncomplicated: Secondary | ICD-10-CM

## 2016-08-06 ENCOUNTER — Other Ambulatory Visit: Payer: Self-pay | Admitting: Internal Medicine

## 2016-08-18 ENCOUNTER — Ambulatory Visit: Payer: Medicare Other

## 2016-08-18 DIAGNOSIS — J4540 Moderate persistent asthma, uncomplicated: Secondary | ICD-10-CM

## 2016-08-19 ENCOUNTER — Ambulatory Visit (INDEPENDENT_AMBULATORY_CARE_PROVIDER_SITE_OTHER): Payer: Medicare Other | Admitting: *Deleted

## 2016-08-19 DIAGNOSIS — J4540 Moderate persistent asthma, uncomplicated: Secondary | ICD-10-CM

## 2016-08-22 ENCOUNTER — Other Ambulatory Visit: Payer: Self-pay | Admitting: Internal Medicine

## 2016-08-23 ENCOUNTER — Other Ambulatory Visit: Payer: Self-pay | Admitting: Allergy and Immunology

## 2016-08-30 ENCOUNTER — Other Ambulatory Visit: Payer: Self-pay | Admitting: Allergy and Immunology

## 2016-09-02 ENCOUNTER — Ambulatory Visit: Payer: Medicare Other

## 2016-09-03 DIAGNOSIS — J4540 Moderate persistent asthma, uncomplicated: Secondary | ICD-10-CM

## 2016-09-04 ENCOUNTER — Ambulatory Visit: Payer: Self-pay | Admitting: *Deleted

## 2016-09-04 ENCOUNTER — Ambulatory Visit (INDEPENDENT_AMBULATORY_CARE_PROVIDER_SITE_OTHER): Payer: Medicare Other | Admitting: *Deleted

## 2016-09-04 DIAGNOSIS — J4540 Moderate persistent asthma, uncomplicated: Secondary | ICD-10-CM

## 2016-09-14 ENCOUNTER — Other Ambulatory Visit: Payer: Self-pay | Admitting: Internal Medicine

## 2016-09-17 DIAGNOSIS — J4540 Moderate persistent asthma, uncomplicated: Secondary | ICD-10-CM | POA: Diagnosis not present

## 2016-09-18 ENCOUNTER — Ambulatory Visit (INDEPENDENT_AMBULATORY_CARE_PROVIDER_SITE_OTHER): Payer: Medicare Other

## 2016-09-18 DIAGNOSIS — J4540 Moderate persistent asthma, uncomplicated: Secondary | ICD-10-CM

## 2016-09-22 ENCOUNTER — Other Ambulatory Visit: Payer: Self-pay | Admitting: Allergy and Immunology

## 2016-10-02 ENCOUNTER — Ambulatory Visit: Payer: Self-pay

## 2016-10-06 ENCOUNTER — Other Ambulatory Visit: Payer: Self-pay | Admitting: Allergy and Immunology

## 2016-10-08 DIAGNOSIS — M25551 Pain in right hip: Secondary | ICD-10-CM | POA: Diagnosis not present

## 2016-10-10 ENCOUNTER — Other Ambulatory Visit: Payer: Self-pay | Admitting: Allergy and Immunology

## 2016-10-13 DIAGNOSIS — M25559 Pain in unspecified hip: Secondary | ICD-10-CM | POA: Diagnosis not present

## 2016-10-15 DIAGNOSIS — J4540 Moderate persistent asthma, uncomplicated: Secondary | ICD-10-CM | POA: Diagnosis not present

## 2016-10-16 ENCOUNTER — Ambulatory Visit (INDEPENDENT_AMBULATORY_CARE_PROVIDER_SITE_OTHER): Payer: Medicare Other

## 2016-10-16 DIAGNOSIS — J4540 Moderate persistent asthma, uncomplicated: Secondary | ICD-10-CM

## 2016-10-24 ENCOUNTER — Other Ambulatory Visit: Payer: Self-pay | Admitting: Allergy and Immunology

## 2016-10-28 DIAGNOSIS — J4550 Severe persistent asthma, uncomplicated: Secondary | ICD-10-CM | POA: Diagnosis not present

## 2016-10-28 DIAGNOSIS — K219 Gastro-esophageal reflux disease without esophagitis: Secondary | ICD-10-CM | POA: Diagnosis not present

## 2016-10-28 DIAGNOSIS — J449 Chronic obstructive pulmonary disease, unspecified: Secondary | ICD-10-CM | POA: Diagnosis not present

## 2016-10-28 DIAGNOSIS — J3089 Other allergic rhinitis: Secondary | ICD-10-CM | POA: Diagnosis not present

## 2016-10-29 ENCOUNTER — Encounter: Payer: Self-pay | Admitting: Allergy and Immunology

## 2016-10-29 ENCOUNTER — Ambulatory Visit (INDEPENDENT_AMBULATORY_CARE_PROVIDER_SITE_OTHER): Payer: Medicare Other | Admitting: Allergy and Immunology

## 2016-10-29 ENCOUNTER — Ambulatory Visit: Payer: Medicare Other | Admitting: *Deleted

## 2016-10-29 VITALS — BP 146/80 | HR 78 | Resp 16 | Ht 68.0 in | Wt 208.0 lb

## 2016-10-29 DIAGNOSIS — J3089 Other allergic rhinitis: Secondary | ICD-10-CM | POA: Diagnosis not present

## 2016-10-29 DIAGNOSIS — M25559 Pain in unspecified hip: Secondary | ICD-10-CM | POA: Diagnosis not present

## 2016-10-29 DIAGNOSIS — J449 Chronic obstructive pulmonary disease, unspecified: Secondary | ICD-10-CM | POA: Diagnosis not present

## 2016-10-29 DIAGNOSIS — J4550 Severe persistent asthma, uncomplicated: Secondary | ICD-10-CM | POA: Diagnosis not present

## 2016-10-29 DIAGNOSIS — K219 Gastro-esophageal reflux disease without esophagitis: Secondary | ICD-10-CM | POA: Diagnosis not present

## 2016-10-29 MED ORDER — FLUTICASONE FUROATE-VILANTEROL 200-25 MCG/INH IN AEPB: 1.0000 | INHALATION_SPRAY | Freq: Every day | RESPIRATORY_TRACT | 5 refills | Status: DC

## 2016-10-29 NOTE — Progress Notes (Signed)
Follow-up Note  Referring Provider: Janith Lima, MD Primary Provider: Janith Lima, MD Date of Office Visit: 10/29/2016  Subjective:   Edwin [name] (DOB: [birth year]) is a 76 y.o. male who returns to the [facility] on 10/29/2016 in re-evaluation of the following:  HPI: Edwin [name] returns to this clinic in reevaluation of his COPD with component of asthma, allergic rhinitis, and reflux. His last visit to this clinic was September 2017.  He is really done very well during the interval and has not required a systemic steroid or antibiotic to treat any respiratory tract issues and rarely has any significant respiratory tract symptoms requiring him to use a bronchodilator greater than one or 2 times per week. He is still dyspneic whenever he exerts himself to any extent. Unfortunately, he cannot undergo a progressive exercise routine because he tore his right hamstring about a month ago and is undergoing rehabilitation.  He has had no problems with his nose and his reflux is under excellent control.  He has been slowly tapering down his medications. He no longer uses any Nasonex or montelukast or Pepcid but does continue on Breo and Dexilant and remains on Xolair.  He did obtain a flu vaccine last fall.  Allergies as of 10/29/2016      Reactions   Ramipril    REACTION: chest congestion & cough. No angioedema      Medication List      acetaminophen 325 MG tablet Commonly known as:  TYLENOL Take 650 mg by mouth every 6 (six) hours as needed for mild pain.   acyclovir 200 MG capsule Commonly known as:  ZOVIRAX Take 1 capsule (200 mg total) by mouth 4 (four) times daily.   albuterol 108 (90 Base) MCG/ACT inhaler Commonly known as:  PROVENTIL HFA Inhale 2 puffs into the lungs every 4 (four) hours as needed for wheezing or shortness of breath. 1-2 puffs every 4 hrs as needed.   allopurinol 300 MG tablet Commonly known as:  ZYLOPRIM TAKE 1 TABLET (300 MG  TOTAL) BY MOUTH DAILY.   aspirin EC 81 MG tablet Take 81 mg by mouth daily.   atorvastatin 20 MG tablet Commonly known as:  LIPITOR Take 1 tablet (20 mg total) by mouth daily.   bimatoprost 0.03 % ophthalmic solution Commonly known as:  LUMIGAN Place 1 drop into both eyes at bedtime.   BREO ELLIPTA 200-25 MCG/INH Aepb Generic drug:  fluticasone furoate-vilanterol INHALE 1 PUFF INTO THE LUNGS DAILY.   brimonidine 0.1 % Soln Commonly known as:  ALPHAGAN P 1 drop 2 (two) times daily. Both eyes   cefdinir 300 MG capsule Commonly known as:  OMNICEF Take 1 capsule (300 mg total) by mouth 2 (two) times daily.   DEXILANT 60 MG capsule Generic drug:  dexlansoprazole TAKE 1 CAPSULE (60 MG TOTAL) BY MOUTH DAILY.   dextromethorphan-guaiFENesin 30-600 MG 12hr tablet Commonly known as:  MUCINEX DM Take 1 tablet by mouth daily as needed for cough.   EPINEPHrine 0.3 mg/0.3 mL Soaj injection Commonly known as:  EPI-PEN Inject 0.3 mLs (0.3 mg total) into the muscle once.   famotidine 20 MG tablet Commonly known as:  PEPCID One at bedtime   FLUoxetine 10 MG capsule Commonly known as:  PROZAC TAKE 1 CAPSULE (10 MG TOTAL) BY MOUTH DAILY.   ipratropium-albuterol 0.5-2.5 (3) MG/3ML Soln Commonly known as:  DUONEB Take 3 mLs by nebulization every 4 (four) hours as needed.   montelukast 10 MG  tablet Commonly known as:  SINGULAIR Take 1 tablet (10 mg total) by mouth at bedtime.   olmesartan-hydrochlorothiazide 40-12.5 MG tablet Commonly known as:  BENICAR HCT Take 0.5 tablets by mouth daily.   psyllium 58.6 % powder Commonly known as:  METAMUCIL Take 1 packet by mouth daily.   XOLAIR 150 MG injection Generic drug:  omalizumab Inject 150 mg into the skin every 14 (fourteen) days.       Past Medical History:  Diagnosis Date  . Asthma   . COPD (chronic obstructive pulmonary disease) (HCC)    reactive airway disease  . Glaucoma   . Gout   . Hx of colonic polyps    Dr  Earlean Shawl  . Hyperlipidemia    NMR lipoprofile 2005: LDL 113(1416/825), HLD 37, TG 190.  [name] Kitchen Hypertension   . Pneumonia     Past Surgical History:  Procedure Laterality Date  . CATARACT EXTRACTION     bilat, implants  . CHOLECYSTECTOMY  1995  . LUMBAR LAMINECTOMY  1982  . POLYPECTOMY  2003    neg 2006, Dr.Medoff  . tear duct surgery  1998   for excess tearing, skin graft L foot @ age 68 1/2    Review of systems negative except as noted in HPI / PMHx or noted below:  Review of Systems  Constitutional: Negative.   HENT: Negative.   Eyes: Negative.   Respiratory: Negative.   Cardiovascular: Negative.   Gastrointestinal: Negative.   Genitourinary: Negative.   Musculoskeletal: Negative.   Skin: Negative.   Neurological: Negative.   Endo/Heme/Allergies: Negative.   Psychiatric/Behavioral: Negative.      Objective:   Vitals:   10/29/16 1048  BP: (!) 146/80  Pulse: 78  Resp: 16   Height: 5\' 8"  (172.7 cm)  Weight: 208 lb (94.3 kg)   Physical Exam  Constitutional: He is well-developed, well-nourished, and in no distress.  HENT:  Head: Normocephalic.  Right Ear: Tympanic membrane, external ear and ear canal normal.  Left Ear: Tympanic membrane, external ear and ear canal normal.  Nose: Nose normal. No mucosal edema or rhinorrhea.  Mouth/Throat: Uvula is midline, oropharynx is clear and moist and mucous membranes are normal. No oropharyngeal exudate.  Eyes: Conjunctivae are normal.  Neck: Trachea normal. No tracheal tenderness present. No tracheal deviation present. No thyromegaly present.  Cardiovascular: Normal rate, regular rhythm, S1 normal, S2 normal and normal heart sounds.   No murmur heard. Pulmonary/Chest: Breath sounds normal. No stridor. No respiratory distress. He has no wheezes. He has no rales.  Musculoskeletal: He exhibits no edema.  Lymphadenopathy:       Head (right side): No tonsillar adenopathy present.       Head (left side): No tonsillar adenopathy  present.    He has no cervical adenopathy.  Neurological: He is alert. Gait normal.  Skin: No rash noted. He is not diaphoretic. No erythema. Nails show no clubbing.  Psychiatric: Mood and affect normal.    Diagnostics:    Spirometry was performed and demonstrated an FEV1 of 2.12 at 76 % of predicted.  The patient had an Asthma Control Test with the following results: ACT Total Score: 17.    Assessment and Plan:   1. COPD with asthma ([place])   2. Other allergic rhinitis   3. LPRD (laryngopharyngeal reflux disease)     1. Continue Xolair and EpiPen  2. Continue to Treat inflammation:   A. Continue Breo 200 one inhalation one time per day  B. Do not restart  Nasonex  C. Do not restart montelukast    3. Continue to Treat reflux:   A. Dexilant 60 mg in the a.m.  B. Do not restart pepcid  C. Continue off all caffeine and chocolate  4. If needed:   A. ProAir HFA 2 puffs every 4-6 hours  B. nasal saline washes  C. OTC antihistamine  D. Mucinex DM 2 tablets twice a day  E. Duoneb nebulized every 4-6 hours if needed.  5. Return to clinic in November 2018 or earlier if problem  6. Obtain fall flu vaccine  Edwin [name] has really done very well and his improvement appears to be a result of using Xolair. While continuing to use a collection of anti-inflammatory medications for his respiratory tract and continuing on omalizumab he has had very little problems with his respiratory tract other than his dyspnea on exertion. He has very little cough and very little sputum production and overall is very satisfied with the response that he has received with this treatment. He will continue to use this collection of treatment which includes a anti-inflammatory approach to his respiratory tract and therapy directed against reflux and I will see him back in this clinic in 6 months or earlier if there is a problem.  Allena Katz, MD Allergy / Immunology [place]

## 2016-10-29 NOTE — Patient Instructions (Addendum)
  1. Continue Xolair and EpiPen  2. Continue to Treat inflammation:   A. Continue Breo 200 one inhalation one time per day  B. Do not restart Nasonex  C. Do not restart montelukast    3. Continue to Treat reflux:   A. Dexilant 60 mg in the a.m.  B. Do not restart pepcid  C. Continue off all caffeine and chocolate  4. If needed:   A. ProAir HFA 2 puffs every 4-6 hours  B. nasal saline washes  C. OTC antihistamine  D. Mucinex DM 2 tablets twice a day  E. Duoneb nebulized every 4-6 hours if needed.  5. Return to clinic in November 2018 or earlier if problem  6. Obtain fall flu vaccine

## 2016-11-05 DIAGNOSIS — H53431 Sector or arcuate defects, right eye: Secondary | ICD-10-CM | POA: Diagnosis not present

## 2016-11-05 DIAGNOSIS — H401132 Primary open-angle glaucoma, bilateral, moderate stage: Secondary | ICD-10-CM | POA: Diagnosis not present

## 2016-11-05 DIAGNOSIS — H353112 Nonexudative age-related macular degeneration, right eye, intermediate dry stage: Secondary | ICD-10-CM | POA: Diagnosis not present

## 2016-11-11 DIAGNOSIS — J4540 Moderate persistent asthma, uncomplicated: Secondary | ICD-10-CM

## 2016-11-12 ENCOUNTER — Ambulatory Visit (INDEPENDENT_AMBULATORY_CARE_PROVIDER_SITE_OTHER): Payer: Medicare Other | Admitting: *Deleted

## 2016-11-12 DIAGNOSIS — J4540 Moderate persistent asthma, uncomplicated: Secondary | ICD-10-CM | POA: Diagnosis not present

## 2016-11-12 DIAGNOSIS — S76311D Strain of muscle, fascia and tendon of the posterior muscle group at thigh level, right thigh, subsequent encounter: Secondary | ICD-10-CM | POA: Diagnosis not present

## 2016-11-12 DIAGNOSIS — R262 Difficulty in walking, not elsewhere classified: Secondary | ICD-10-CM | POA: Diagnosis not present

## 2016-11-12 DIAGNOSIS — M79604 Pain in right leg: Secondary | ICD-10-CM | POA: Diagnosis not present

## 2016-11-12 DIAGNOSIS — M531 Cervicobrachial syndrome: Secondary | ICD-10-CM | POA: Diagnosis not present

## 2016-11-19 DIAGNOSIS — S76311D Strain of muscle, fascia and tendon of the posterior muscle group at thigh level, right thigh, subsequent encounter: Secondary | ICD-10-CM | POA: Diagnosis not present

## 2016-11-19 DIAGNOSIS — R531 Weakness: Secondary | ICD-10-CM | POA: Diagnosis not present

## 2016-11-19 DIAGNOSIS — R262 Difficulty in walking, not elsewhere classified: Secondary | ICD-10-CM | POA: Diagnosis not present

## 2016-11-19 DIAGNOSIS — M79604 Pain in right leg: Secondary | ICD-10-CM | POA: Diagnosis not present

## 2016-11-21 DIAGNOSIS — R262 Difficulty in walking, not elsewhere classified: Secondary | ICD-10-CM | POA: Diagnosis not present

## 2016-11-21 DIAGNOSIS — R531 Weakness: Secondary | ICD-10-CM | POA: Diagnosis not present

## 2016-11-21 DIAGNOSIS — S76311D Strain of muscle, fascia and tendon of the posterior muscle group at thigh level, right thigh, subsequent encounter: Secondary | ICD-10-CM | POA: Diagnosis not present

## 2016-11-21 DIAGNOSIS — M79604 Pain in right leg: Secondary | ICD-10-CM | POA: Diagnosis not present

## 2016-11-22 ENCOUNTER — Other Ambulatory Visit: Payer: Self-pay | Admitting: Internal Medicine

## 2016-11-24 DIAGNOSIS — R531 Weakness: Secondary | ICD-10-CM | POA: Diagnosis not present

## 2016-11-24 DIAGNOSIS — M79604 Pain in right leg: Secondary | ICD-10-CM | POA: Diagnosis not present

## 2016-11-24 DIAGNOSIS — S76311D Strain of muscle, fascia and tendon of the posterior muscle group at thigh level, right thigh, subsequent encounter: Secondary | ICD-10-CM | POA: Diagnosis not present

## 2016-11-24 DIAGNOSIS — R262 Difficulty in walking, not elsewhere classified: Secondary | ICD-10-CM | POA: Diagnosis not present

## 2016-11-25 DIAGNOSIS — J4540 Moderate persistent asthma, uncomplicated: Secondary | ICD-10-CM | POA: Diagnosis not present

## 2016-11-26 ENCOUNTER — Ambulatory Visit (INDEPENDENT_AMBULATORY_CARE_PROVIDER_SITE_OTHER): Payer: Medicare Other

## 2016-11-26 DIAGNOSIS — S76311D Strain of muscle, fascia and tendon of the posterior muscle group at thigh level, right thigh, subsequent encounter: Secondary | ICD-10-CM | POA: Diagnosis not present

## 2016-11-26 DIAGNOSIS — R262 Difficulty in walking, not elsewhere classified: Secondary | ICD-10-CM | POA: Diagnosis not present

## 2016-11-26 DIAGNOSIS — R531 Weakness: Secondary | ICD-10-CM | POA: Diagnosis not present

## 2016-11-26 DIAGNOSIS — J4540 Moderate persistent asthma, uncomplicated: Secondary | ICD-10-CM | POA: Diagnosis not present

## 2016-11-26 DIAGNOSIS — M79604 Pain in right leg: Secondary | ICD-10-CM | POA: Diagnosis not present

## 2016-12-08 DIAGNOSIS — S76311D Strain of muscle, fascia and tendon of the posterior muscle group at thigh level, right thigh, subsequent encounter: Secondary | ICD-10-CM | POA: Diagnosis not present

## 2016-12-08 DIAGNOSIS — M79604 Pain in right leg: Secondary | ICD-10-CM | POA: Diagnosis not present

## 2016-12-08 DIAGNOSIS — R262 Difficulty in walking, not elsewhere classified: Secondary | ICD-10-CM | POA: Diagnosis not present

## 2016-12-08 DIAGNOSIS — R531 Weakness: Secondary | ICD-10-CM | POA: Diagnosis not present

## 2016-12-09 DIAGNOSIS — J4540 Moderate persistent asthma, uncomplicated: Secondary | ICD-10-CM

## 2016-12-10 ENCOUNTER — Ambulatory Visit (INDEPENDENT_AMBULATORY_CARE_PROVIDER_SITE_OTHER): Payer: Medicare Other | Admitting: *Deleted

## 2016-12-10 DIAGNOSIS — J4540 Moderate persistent asthma, uncomplicated: Secondary | ICD-10-CM

## 2016-12-10 DIAGNOSIS — M79604 Pain in right leg: Secondary | ICD-10-CM | POA: Diagnosis not present

## 2016-12-10 DIAGNOSIS — R531 Weakness: Secondary | ICD-10-CM | POA: Diagnosis not present

## 2016-12-10 DIAGNOSIS — S76311D Strain of muscle, fascia and tendon of the posterior muscle group at thigh level, right thigh, subsequent encounter: Secondary | ICD-10-CM | POA: Diagnosis not present

## 2016-12-10 DIAGNOSIS — R262 Difficulty in walking, not elsewhere classified: Secondary | ICD-10-CM | POA: Diagnosis not present

## 2016-12-23 DIAGNOSIS — J4540 Moderate persistent asthma, uncomplicated: Secondary | ICD-10-CM | POA: Diagnosis not present

## 2016-12-24 ENCOUNTER — Ambulatory Visit (INDEPENDENT_AMBULATORY_CARE_PROVIDER_SITE_OTHER): Payer: Medicare Other

## 2016-12-24 DIAGNOSIS — J4540 Moderate persistent asthma, uncomplicated: Secondary | ICD-10-CM | POA: Diagnosis not present

## 2017-01-06 DIAGNOSIS — J4540 Moderate persistent asthma, uncomplicated: Secondary | ICD-10-CM

## 2017-01-07 ENCOUNTER — Ambulatory Visit (INDEPENDENT_AMBULATORY_CARE_PROVIDER_SITE_OTHER): Payer: Medicare Other

## 2017-01-07 DIAGNOSIS — J4540 Moderate persistent asthma, uncomplicated: Secondary | ICD-10-CM

## 2017-01-20 DIAGNOSIS — J4540 Moderate persistent asthma, uncomplicated: Secondary | ICD-10-CM

## 2017-01-21 ENCOUNTER — Ambulatory Visit (INDEPENDENT_AMBULATORY_CARE_PROVIDER_SITE_OTHER): Payer: Medicare Other | Admitting: *Deleted

## 2017-01-21 DIAGNOSIS — J4540 Moderate persistent asthma, uncomplicated: Secondary | ICD-10-CM | POA: Diagnosis not present

## 2017-01-26 ENCOUNTER — Other Ambulatory Visit: Payer: Self-pay | Admitting: Allergy and Immunology

## 2017-01-26 DIAGNOSIS — I10 Essential (primary) hypertension: Secondary | ICD-10-CM

## 2017-01-27 ENCOUNTER — Other Ambulatory Visit: Payer: Self-pay | Admitting: Internal Medicine

## 2017-01-27 DIAGNOSIS — I10 Essential (primary) hypertension: Secondary | ICD-10-CM

## 2017-01-28 ENCOUNTER — Other Ambulatory Visit: Payer: Self-pay | Admitting: Internal Medicine

## 2017-01-28 DIAGNOSIS — I10 Essential (primary) hypertension: Secondary | ICD-10-CM

## 2017-02-04 ENCOUNTER — Ambulatory Visit: Payer: Medicare Other

## 2017-02-04 DIAGNOSIS — J4540 Moderate persistent asthma, uncomplicated: Secondary | ICD-10-CM | POA: Diagnosis not present

## 2017-02-05 ENCOUNTER — Ambulatory Visit (INDEPENDENT_AMBULATORY_CARE_PROVIDER_SITE_OTHER): Payer: Medicare Other | Admitting: *Deleted

## 2017-02-05 DIAGNOSIS — J4540 Moderate persistent asthma, uncomplicated: Secondary | ICD-10-CM

## 2017-02-17 DIAGNOSIS — J4540 Moderate persistent asthma, uncomplicated: Secondary | ICD-10-CM | POA: Diagnosis not present

## 2017-02-18 ENCOUNTER — Ambulatory Visit (INDEPENDENT_AMBULATORY_CARE_PROVIDER_SITE_OTHER): Payer: Medicare Other

## 2017-02-18 DIAGNOSIS — J4540 Moderate persistent asthma, uncomplicated: Secondary | ICD-10-CM

## 2017-02-22 DIAGNOSIS — Z23 Encounter for immunization: Secondary | ICD-10-CM | POA: Diagnosis not present

## 2017-03-03 DIAGNOSIS — J4540 Moderate persistent asthma, uncomplicated: Secondary | ICD-10-CM | POA: Diagnosis not present

## 2017-03-04 ENCOUNTER — Ambulatory Visit (INDEPENDENT_AMBULATORY_CARE_PROVIDER_SITE_OTHER): Payer: Medicare Other

## 2017-03-04 DIAGNOSIS — J4540 Moderate persistent asthma, uncomplicated: Secondary | ICD-10-CM

## 2017-03-14 ENCOUNTER — Other Ambulatory Visit: Payer: Self-pay | Admitting: Internal Medicine

## 2017-03-14 DIAGNOSIS — E785 Hyperlipidemia, unspecified: Secondary | ICD-10-CM

## 2017-03-17 DIAGNOSIS — J4540 Moderate persistent asthma, uncomplicated: Secondary | ICD-10-CM | POA: Diagnosis not present

## 2017-03-18 ENCOUNTER — Ambulatory Visit (INDEPENDENT_AMBULATORY_CARE_PROVIDER_SITE_OTHER): Payer: Medicare Other | Admitting: *Deleted

## 2017-03-18 DIAGNOSIS — J4540 Moderate persistent asthma, uncomplicated: Secondary | ICD-10-CM | POA: Diagnosis not present

## 2017-03-26 ENCOUNTER — Encounter: Payer: Self-pay | Admitting: Allergy & Immunology

## 2017-03-26 ENCOUNTER — Ambulatory Visit (INDEPENDENT_AMBULATORY_CARE_PROVIDER_SITE_OTHER): Payer: Medicare Other | Admitting: Allergy & Immunology

## 2017-03-26 VITALS — BP 120/75 | HR 94 | Temp 98.0°F | Resp 17

## 2017-03-26 DIAGNOSIS — J0100 Acute maxillary sinusitis, unspecified: Secondary | ICD-10-CM

## 2017-03-26 DIAGNOSIS — J4541 Moderate persistent asthma with (acute) exacerbation: Secondary | ICD-10-CM | POA: Diagnosis not present

## 2017-03-26 MED ORDER — AMOXICILLIN-POT CLAVULANATE 875-125 MG PO TABS: 1.0000 | ORAL_TABLET | Freq: Two times a day (BID) | ORAL | 0 refills | Status: AC

## 2017-03-26 NOTE — Patient Instructions (Addendum)
1. Moderate persistent asthma with acute exacerbation - You were wheezing in all lung [place] today, but you sounded better after the albuterol treatment. - The steroids provided for the sinus infection should also help with inflammation within your lungs. - Daily controller medication(s): Breo 200/25 one puff once daily + Xolair every two weeks - Prior to physical activity: ProAir 2 puffs 10-15 minutes before physical activity. - Rescue medications: ProAir 4 puffs every 4-6 hours as needed - Asthma control goals:  * Full participation in all desired activities (may need albuterol before activity) * Albuterol use two time or less a week on average (not counting use with activity) * Cough interfering with sleep two time or less a month * Oral steroids no more than once a year * No hospitalizations  2. Acute non-recurrent maxillary sinusitis - With your current symptoms and time course, antibiotics are needed.  - Continue with nasal saline spray (i.e., Simply Saline) or nasal saline lavage (i.e., NeilMed) as needed prior to medicated nasal sprays. - For thick post nasal drainage, continue with guaifenesin [phone] mg (Mucinex)  twice daily as needed with adequate hydration as discussed. - Start the prednisone pack provided.  - Call us on Monday with an update.  3. Return in about 6 months (around 09/24/2017).  Please inform us of any Emergency Department visits, hospitalizations, or changes in symptoms. Call us before going to the ED for breathing or allergy symptoms since we might be able to fit you in for a sick visit. Feel free to contact us anytime with any questions, problems, or concerns.  It was a pleasure to meet you today! Enjoy the fall season!  Websites that have reliable patient information: 1. American Academy of Asthma, Allergy, and Immunology: www.aaaai.org 2. Food Allergy Research and Education (FARE): foodallergy.org 3. Mothers of Asthmatics:  http://www.asthmacommunitynetwork.org 4. American College of Allergy, Asthma, and Immunology: www.acaai.org   Election Day is coming up on Tuesday, November 6th! Although it is too late to register to vote by mail, you can still register up to November 5th at any of the early voting locations. Try to early vote in case there are problems with your registration!   If you are turned away at the polls, you have the right to request a provisional ballot, which is required by law!      [place]- Blue Room (open 8am - 5pm) First Floor [place], [place] (open [place]) [place], [place] (open 7am - 7pm)  [place], [employer] (open 7am - 7pm) [address], [employer] (open 7am - 7pm) [address] [place], [employer] (open 7am - 7pm) [place], [place] (open [place]) [place], [place] (open 7am - 7pm) [place], [employer] (open 7am - 7pm) [address], [place]

## 2017-03-26 NOTE — Progress Notes (Signed)
FOLLOW UP  Date of Service/Encounter:  03/26/17   Assessment:   Moderate persistent asthma with acute exacerbation  Acute non-recurrent maxillary sinusitis  Perennial and seasonal allergic rhinitis (dog, dust mite, molds)  Plan/Recommendations:   1. Moderate persistent asthma with acute exacerbation - You were wheezing in all lung Edwin [name] today, but you sounded better after the albuterol treatment. - The steroids provided for the sinus infection should also help with inflammation within your lungs. - Daily controller medication(s): Breo 200/25 one puff once daily + Xolair every two weeks - Prior to physical activity: ProAir 2 puffs 10-15 minutes before physical activity. - Rescue medications: ProAir 4 puffs every 4-6 hours as needed - Asthma control goals:  * Full participation in all desired activities (may need albuterol before activity) * Albuterol use two time or less a week on average (not counting use with activity) * Cough interfering with sleep two time or less a month * Oral steroids no more than once a year * No hospitalizations  2. Acute non-recurrent maxillary sinusitis - With your current symptoms and time course, antibiotics are needed.  - Continue with nasal saline spray (i.e., Simply Saline) or nasal saline lavage (i.e., NeilMed) as needed prior to medicated nasal sprays. - For thick post nasal drainage, continue with guaifenesin [phone] mg (Mucinex)  twice daily as needed with adequate hydration as discussed. - Start the prednisone pack provided.  - Call us on Monday with an update.  3. Return in about 6 months (around 09/24/2017).  Please inform us of any Emergency Department visits, hospitalizations, or changes in symptoms. Call us before going to the ED for breathing or allergy symptoms since we might be able to fit you in for a sick visit. Feel free to contact us anytime with any questions, problems, or concerns.  It was a pleasure to meet you today!  Enjoy the fall season!  Websites that have reliable patient information: 1. American Academy of Asthma, Allergy, and Immunology: www.aaaai.org 2. Food Allergy Research and Education (FARE): foodallergy.org 3. Mothers of Asthmatics: http://www.asthmacommunitynetwork.org 4. American College of Allergy, Asthma, and Immunology: www.acaai.org   Election Day is coming up on Tuesday, November 6th! Although it is too late to register to vote by mail, you can still register up to November 5th at any of the early voting locations. Try to early vote in case there are problems with your registration!   If you are turned away at the polls, you have the right to request a provisional ballot, which is required by law!      [place]- Blue Room (open 8am - 5pm) First Floor [place], [place] (open [place]) [place], [place] (open 7am - 7pm)  [place], [employer] (open 7am - 7pm) [address], [employer] (open 7am - 7pm) [address], [employer] (open 7am - 7pm) [place], [place] (open [place]) [place], [place] (open 7am - 7pm) [place], [employer] (open 7am - 7pm) [address], [place]         Subjective:   Edwin [name] is a 76 y.o. male presenting today for follow up of  Chief Complaint  Patient presents with  .  Cough    green mucus started on tuesday and have gotten worse  . Wheezing    Aul W Edwin [name] has a history of the following: Patient Active Problem List   Diagnosis Date Noted  . PSA elevation 03/04/2016  . LPRD (laryngopharyngeal reflux disease) 06/06/2015  . COPD with asthma ([place]) 06/06/2015  . COPD GOLD 0 s/p smoking cessation in 1985 02/15/2015  . Routine general  medical examination at a health care facility 10/03/2013  . Hyperglycemia 10/03/2013  . Benign prostatic hyperplasia 10/03/2013  . Hyperlipidemia with target LDL less than 130 05/13/2012  . Glaucoma 01/08/2011  . GERD 12/20/2008  . Gout 01/20/2007  . Essential hypertension 01/20/2007    History obtained from: chart review and patient.  [facility] Primary Care Provider is Janith Lima, MD.     Edwin [name] is a 76 y.o. male presenting for a sick visit. He is followed closely by Dr. Neldon Mc and was last seen in May 2018. He has a history of asthma-COPD overlap syndrome as well as allergic rhinitis and reflux. At the last visit, he was doing well without the need for steroids in over six months. He remained on Breo 200/5 one puff once daily, but Nasonex and Singulair were both stopped. He was continued on Dexilant 60mg  in the morning and reflux precautions were discussed. He is on Xolair every two weeks for his asthma.    Since the last visit, he has mostly done well. However, yesterday he developed acute onset of nasal congestion, postnasal drip, and persistent cough. He endorses some sinus pressure as well. He has been afebrile, but his symptoms have progressed rather quickly. He has used his albuterol a few times with some relief. He also endorses a cough productive of thick mucous; at this point it is yellow, but he reports that it is already starting to turn green. He is worried that his symptoms are only going to progress and worsen.   Allergic rhinitis has been well controlled. He had testing performed upon his first visit in November 2016 that was positive to dust mite, dog, and molds (molds mix #1-4)  Otherwise, there have been no changes to his past medical history, surgical history, family history, or social history.    Review of Systems: a 14-point review of systems is pertinent for what is mentioned in HPI.  Otherwise, all other systems were negative. Constitutional: negative  other than that listed in the HPI Eyes: negative other than that listed in the HPI Ears, nose, mouth, throat, and face: negative other than that listed in the HPI Respiratory: negative other than that listed in the HPI Cardiovascular: negative other than that listed in the HPI Gastrointestinal: negative other than that listed in the HPI Genitourinary: negative other than that listed in the HPI Integument: negative other than that listed in the HPI Hematologic: negative other than that listed in the HPI Musculoskeletal: negative other than that listed in the HPI Neurological: negative other than that listed in the HPI Allergy/Immunologic: negative other than that listed in the HPI    Objective:   Blood pressure 120/75, pulse 94, temperature 98 F (36.7 C), temperature source Oral, resp. rate 17, SpO2 95 %. There is no height or weight on file to calculate BMI.   Physical Exam:  General: Alert, interactive, in no acute distress. Pleasant and personable.  Eyes: No conjunctival injection bilaterally, no discharge on the right, no discharge on the left and no Horner-Trantas dots present. PERRL bilaterally. EOMI without  pain. No photophobia.  Ears: Right TM pearly gray with normal light reflex, Left TM pearly gray with normal light reflex, Right TM intact without perforation and Left TM intact without perforation.  Nose/Throat: External nose within normal limits and septum midline. Turbinates markedly edematous with thick discharge. Posterior oropharynx erythematous with cobblestoning in the posterior oropharynx. Tonsils 2+ without exudates.  Tongue without thrush. Bilateral frontal sinus pressures.  Adenopathy: shoddy bilateral anterior cervical lymphadenopathy Lungs: Decreased breath sounds with expiratory wheezing bilaterally. No increased work of breathing. CV: Normal S1/S2. No murmurs. Capillary refill <2 seconds.  Skin: Warm and dry, without lesions or rashes. Neuro:   Grossly intact.  No focal deficits appreciated. Responsive to questions.  Diagnostic studies: none    Salvatore Marvel, MD [facility]

## 2017-04-01 ENCOUNTER — Ambulatory Visit: Payer: Medicare Other

## 2017-04-01 DIAGNOSIS — J4541 Moderate persistent asthma with (acute) exacerbation: Secondary | ICD-10-CM

## 2017-04-02 ENCOUNTER — Ambulatory Visit (INDEPENDENT_AMBULATORY_CARE_PROVIDER_SITE_OTHER): Payer: Medicare Other

## 2017-04-02 DIAGNOSIS — J4541 Moderate persistent asthma with (acute) exacerbation: Secondary | ICD-10-CM | POA: Diagnosis not present

## 2017-04-15 DIAGNOSIS — J4540 Moderate persistent asthma, uncomplicated: Secondary | ICD-10-CM

## 2017-04-16 ENCOUNTER — Ambulatory Visit (INDEPENDENT_AMBULATORY_CARE_PROVIDER_SITE_OTHER): Payer: Medicare Other | Admitting: *Deleted

## 2017-04-16 DIAGNOSIS — J4540 Moderate persistent asthma, uncomplicated: Secondary | ICD-10-CM | POA: Diagnosis not present

## 2017-04-28 ENCOUNTER — Ambulatory Visit (INDEPENDENT_AMBULATORY_CARE_PROVIDER_SITE_OTHER): Payer: Medicare Other | Admitting: Allergy and Immunology

## 2017-04-28 ENCOUNTER — Encounter: Payer: Self-pay | Admitting: Allergy and Immunology

## 2017-04-28 VITALS — BP 146/98 | HR 58 | Temp 97.8°F | Wt 216.0 lb

## 2017-04-28 DIAGNOSIS — J3089 Other allergic rhinitis: Secondary | ICD-10-CM

## 2017-04-28 DIAGNOSIS — J324 Chronic pansinusitis: Secondary | ICD-10-CM

## 2017-04-28 DIAGNOSIS — K219 Gastro-esophageal reflux disease without esophagitis: Secondary | ICD-10-CM | POA: Diagnosis not present

## 2017-04-28 DIAGNOSIS — J45901 Unspecified asthma with (acute) exacerbation: Secondary | ICD-10-CM | POA: Diagnosis not present

## 2017-04-28 DIAGNOSIS — J441 Chronic obstructive pulmonary disease with (acute) exacerbation: Secondary | ICD-10-CM | POA: Diagnosis not present

## 2017-04-28 DIAGNOSIS — J4540 Moderate persistent asthma, uncomplicated: Secondary | ICD-10-CM | POA: Diagnosis not present

## 2017-04-28 MED ORDER — CLINDAMYCIN HCL 150 MG PO CAPS: 150.0000 mg | ORAL_CAPSULE | Freq: Three times a day (TID) | ORAL | 0 refills | Status: AC

## 2017-04-28 MED ORDER — PREDNISONE 5 MG PO TABS: ORAL_TABLET | ORAL | 0 refills | Status: DC

## 2017-04-28 NOTE — Progress Notes (Signed)
Follow-up Note  Referring Provider: Janith Lima, MD Primary Provider: Janith Lima, MD Date of Office Visit: 04/28/2017  Subjective:   Edwin [name] (DOB: March 09, 1941) is a 76 y.o. male who returns to the [facility] [place] on 04/28/2017 in re-evaluation of the following:  HPI: Eldra returns to this clinic in reevaluation of his asthma and allergic rhinitis and history of chronic sinusitis and history of reflux induced respiratory disease.  I have not seen him in this clinic since May 2018 but he did visit with Dr. Ernst Bowler on 26 March 2017 for what appeared to be a sinus infection and flare of his asthma requiring an antibiotic and systemic steroids.  He does not believe that he has completely cleared up from that issue and he has been having sore throat and coughing and chest congestion and head congestion and just feels bad in general.  He has been much worse over the course of the past 48 hours or so.  Some of the material that he coughs up is colored.  He does not believe that he has been having any problems with his reflux on his current plan.   He continues on a large collection of medical therapy directed against inflammation including the use of Xolair.  Allergies as of 04/28/2017      Reactions   Ramipril    REACTION: chest congestion & cough. No angioedema      Medication List      acetaminophen 325 MG tablet Commonly known as:  TYLENOL Take 650 mg by mouth every 6 (six) hours as needed for mild pain.   acyclovir 200 MG capsule Commonly known as:  ZOVIRAX Take 1 capsule (200 mg total) by mouth 4 (four) times daily.   albuterol 108 (90 Base) MCG/ACT inhaler Commonly known as:  PROVENTIL HFA Inhale 2 puffs into the lungs every 4 (four) hours as needed for wheezing or shortness of breath. 1-2 puffs every 4 hrs as needed.   allopurinol 300 MG tablet Commonly known as:  ZYLOPRIM TAKE 1 TABLET (300 MG TOTAL) BY MOUTH DAILY.   aspirin EC 81 MG  tablet Take 81 mg by mouth daily.   atorvastatin 20 MG tablet Commonly known as:  LIPITOR TAKE 1 TABLET (20 MG TOTAL) BY MOUTH DAILY.   bimatoprost 0.03 % ophthalmic solution Commonly known as:  LUMIGAN Place 1 drop into both eyes at bedtime.   brimonidine 0.1 % Soln Commonly known as:  ALPHAGAN P 1 drop 2 (two) times daily. Both eyes   cefdinir 300 MG capsule Commonly known as:  OMNICEF Take 1 capsule (300 mg total) by mouth 2 (two) times daily.   clindamycin 150 MG capsule Commonly known as:  CLEOCIN Take 1 capsule (150 mg total) by mouth 3 (three) times daily for 20 days.   DEXILANT 60 MG capsule Generic drug:  dexlansoprazole TAKE 1 CAPSULE (60 MG TOTAL) BY MOUTH DAILY.   dextromethorphan-guaiFENesin 30-600 MG 12hr tablet Commonly known as:  MUCINEX DM Take 1 tablet by mouth daily as needed for cough.   EPINEPHrine 0.3 mg/0.3 mL Soaj injection Commonly known as:  EPI-PEN Inject 0.3 mLs (0.3 mg total) into the muscle once.   famotidine 20 MG tablet Commonly known as:  PEPCID One at bedtime   FLUoxetine 10 MG capsule Commonly known as:  PROZAC TAKE 1 CAPSULE (10 MG TOTAL) BY MOUTH DAILY.   fluticasone furoate-vilanterol 200-25 MCG/INH Aepb Commonly known as:  BREO ELLIPTA Inhale 1 puff  into the lungs daily.   FLUZONE HIGH-DOSE 0.5 ML injection Generic drug:  Influenza vac split quadrivalent PF TO BE ADMINISTERED BY PHARMACIST FOR IMMUNIZATION   ipratropium-albuterol 0.5-2.5 (3) MG/3ML Soln Commonly known as:  DUONEB Take 3 mLs by nebulization every 4 (four) hours as needed.   montelukast 10 MG tablet Commonly known as:  SINGULAIR Take 1 tablet (10 mg total) by mouth at bedtime.   olmesartan-hydrochlorothiazide 40-12.5 MG tablet Commonly known as:  BENICAR HCT TAKE 0.5 TABLETS BY MOUTH DAILY.   predniSONE 5 MG tablet Commonly known as:  DELTASONE Prednisone 20 mg 1 time per day for 5 days, then 15mg  1 time per day for 5 days, then 10mg  1 time per day  for 5 days, then 5mg  1 time per day for 5 days.   psyllium 58.6 % powder Commonly known as:  METAMUCIL Take 1 packet by mouth daily.   XOLAIR 150 MG injection Generic drug:  omalizumab Inject 150 mg into the skin every 14 (fourteen) days.       Past Medical History:  Diagnosis Date  . Asthma   . COPD (chronic obstructive pulmonary disease) (HCC)    reactive airway disease  . Glaucoma   . Gout   . Hx of colonic polyps    Dr Earlean Shawl  . Hyperlipidemia    NMR lipoprofile 2005: LDL 113(1416/825), HLD 37, TG 190.  [name] Kitchen Hypertension   . Pneumonia     Past Surgical History:  Procedure Laterality Date  . CATARACT EXTRACTION     bilat, implants  . CHOLECYSTECTOMY  1995  . LUMBAR LAMINECTOMY  1982  . POLYPECTOMY  2003    neg 2006, Dr.Medoff  . tear duct surgery  1998   for excess tearing, skin graft L foot @ age 10 1/2    Review of systems negative except as noted in HPI / PMHx or noted below:  Review of Systems  Constitutional: Negative.   HENT: Negative.   Eyes: Negative.   Respiratory: Negative.   Cardiovascular: Negative.   Gastrointestinal: Negative.   Genitourinary: Negative.   Musculoskeletal: Negative.   Skin: Negative.   Neurological: Negative.   Endo/Heme/Allergies: Negative.   Psychiatric/Behavioral: Negative.      Objective:   Vitals:   04/28/17 1040  BP: (!) 146/98  Pulse: (!) 58  Temp: 97.8 F (36.6 C)  SpO2: 95%      Weight: 216 lb (98 kg)   Physical Exam  Constitutional: He is well-developed, well-nourished, and in no distress.  Nasal voice  HENT:  Head: Normocephalic.  Right Ear: Tympanic membrane, external ear and ear canal normal.  Left Ear: Tympanic membrane, external ear and ear canal normal.  Nose: Mucosal edema present. No rhinorrhea.  Mouth/Throat: Uvula is midline, oropharynx is clear and moist and mucous membranes are normal. No oropharyngeal exudate.  Eyes: Conjunctivae are normal.  Neck: Trachea normal. No tracheal  tenderness present. No tracheal deviation present. No thyromegaly present.  Cardiovascular: Normal rate, regular rhythm, S1 normal, S2 normal and normal heart sounds.  No murmur heard. Pulmonary/Chest: No stridor. No respiratory distress. He has wheezes (Bilateral expiratory wheezes on forced expiration). He has no rales.  Musculoskeletal: He exhibits no edema.  Lymphadenopathy:       Head (right side): No tonsillar adenopathy present.       Head (left side): No tonsillar adenopathy present.    He has no cervical adenopathy.  Neurological: He is alert. Gait normal.  Skin: No rash noted. He is not  diaphoretic. No erythema. Nails show no clubbing.  Psychiatric: Mood and affect normal.    Diagnostics:    Spirometry was performed and demonstrated an FEV1 of 2.05 at 70 % of predicted.  The patient had an Asthma Control Test with the following results: ACT Total Score: 13.    Assessment and Plan:   1. Acute exacerbation of COPD with asthma ([place])   2. Other allergic rhinitis   3. Chronic pansinusitis   4. LPRD (laryngopharyngeal reflux disease)     1. Continue Xolair and EpiPen  2. Continue to Treat inflammation:   A. Continue Breo 200 one inhalation one time per day  B. OTC Nasacort one spray each nostril one time per day  C. Prednisone 20 mg 1 time per day for 5 days, then 15mg  1 time per day for 5 days, then 10mg  1 time per day for 5 days, then 5mg  1 time per day for 5 days    3. Continue to Treat reflux:   A. Dexilant 60 mg in the a.m.  B. Continue off all caffeine and chocolate  4. Treat infection:   A. Clindamycin 150 one tablet 3 times per day for 20 days  4. If needed:   A. ProAir HFA 2 puffs every 4-6 hours  B. nasal saline washes  C. OTC antihistamine  D. Mucinex DM 2 tablets twice a day  E. Duoneb nebulized every 4-6 hours if needed.  5. Return to clinic in 4 weeks or earlier if problem  Orien appears to have significant inflammation of his airway and may  have redeveloped an issue with chronic sinusitis and I am going to treat him with a prolonged slow taper of low-dose systemic steroids at the same time that we give him a full 20 days of broad-spectrum antibiotic in addition to continuing on all of his other medications as noted above.  Assuming he does well I will see him back in his clinic in 4 weeks or earlier if there is a problem.  Allena Katz, MD Allergy / Immunology [place]

## 2017-04-28 NOTE — Patient Instructions (Addendum)
  1. Continue Xolair and EpiPen  2. Continue to Treat inflammation:   A. Continue Breo 200 one inhalation one time per day  B. OTC Nasacort one spray each nostril one time per day  C. Prednisone 20 mg 1 time per day for 5 days, then 15mg  1 time per day for 5 days, then 10mg  1 time per day for 5 days, then 5mg  1 time per day for 5 days    3. Continue to Treat reflux:   A. Dexilant 60 mg in the a.m.  B. Continue off all caffeine and chocolate  4. Treat infection:   A. Clindamycin 150 one tablet 3 times per day for 20 days  4. If needed:   A. ProAir HFA 2 puffs every 4-6 hours  B. nasal saline washes  C. OTC antihistamine  D. Mucinex DM 2 tablets twice a day  E. Duoneb nebulized every 4-6 hours if needed.  5. Return to clinic in 4 weeks or earlier if problem

## 2017-04-29 ENCOUNTER — Encounter: Payer: Self-pay | Admitting: Allergy and Immunology

## 2017-04-29 ENCOUNTER — Ambulatory Visit (INDEPENDENT_AMBULATORY_CARE_PROVIDER_SITE_OTHER): Payer: Medicare Other | Admitting: *Deleted

## 2017-04-29 DIAGNOSIS — J4540 Moderate persistent asthma, uncomplicated: Secondary | ICD-10-CM

## 2017-04-30 ENCOUNTER — Ambulatory Visit: Payer: Medicare Other

## 2017-05-11 ENCOUNTER — Other Ambulatory Visit: Payer: Self-pay | Admitting: Allergy and Immunology

## 2017-05-12 DIAGNOSIS — J4540 Moderate persistent asthma, uncomplicated: Secondary | ICD-10-CM | POA: Diagnosis not present

## 2017-05-13 ENCOUNTER — Ambulatory Visit (INDEPENDENT_AMBULATORY_CARE_PROVIDER_SITE_OTHER): Payer: Medicare Other | Admitting: *Deleted

## 2017-05-13 DIAGNOSIS — J4540 Moderate persistent asthma, uncomplicated: Secondary | ICD-10-CM

## 2017-05-13 DIAGNOSIS — H401132 Primary open-angle glaucoma, bilateral, moderate stage: Secondary | ICD-10-CM | POA: Diagnosis not present

## 2017-05-13 DIAGNOSIS — H53431 Sector or arcuate defects, right eye: Secondary | ICD-10-CM | POA: Diagnosis not present

## 2017-05-14 ENCOUNTER — Other Ambulatory Visit: Payer: Self-pay | Admitting: Internal Medicine

## 2017-05-14 DIAGNOSIS — I10 Essential (primary) hypertension: Secondary | ICD-10-CM

## 2017-05-19 ENCOUNTER — Ambulatory Visit (INDEPENDENT_AMBULATORY_CARE_PROVIDER_SITE_OTHER): Payer: Medicare Other | Admitting: Allergy and Immunology

## 2017-05-19 ENCOUNTER — Encounter: Payer: Self-pay | Admitting: Allergy and Immunology

## 2017-05-19 VITALS — BP 140/80 | HR 84 | Resp 20

## 2017-05-19 DIAGNOSIS — J449 Chronic obstructive pulmonary disease, unspecified: Secondary | ICD-10-CM

## 2017-05-19 DIAGNOSIS — J324 Chronic pansinusitis: Secondary | ICD-10-CM | POA: Diagnosis not present

## 2017-05-19 DIAGNOSIS — J3089 Other allergic rhinitis: Secondary | ICD-10-CM

## 2017-05-19 DIAGNOSIS — K219 Gastro-esophageal reflux disease without esophagitis: Secondary | ICD-10-CM

## 2017-05-19 NOTE — Patient Instructions (Addendum)
  1. Continue to Treat inflammation:   A. Continue Breo 200 one inhalation one time per day  B. OTC Nasacort one spray each nostril one time per day  C. Xolair and EpiPen    2. Continue to Treat reflux:   A. Dexilant 60 mg in the a.m.  B. Continue off all caffeine and chocolate  3. If needed:   A. ProAir HFA 2 puffs every 4-6 hours  B. nasal saline washes  C. OTC antihistamine  D. Mucinex DM 2 tablets twice a day  E. Duoneb nebulized every 4-6 hours if needed.  4. Return to clinic in 12 weeks or earlier if problem  5.  Utilize a progressive exercise routine.

## 2017-05-19 NOTE — Progress Notes (Signed)
Follow-up Note  Referring Provider: Janith Lima, MD Primary Provider: Janith Lima, MD Date of Office Visit: 05/19/2017  Subjective:   Edwin [name] (DOB: 1940/08/17) is a 76 y.o. male who returns to the [facility] [place] on 05/19/2017 in re-evaluation of the following:  HPI: Edwin [name] returns to this clinic in evaluation of his COPD with component of asthma and allergic rhinitis and chronic sinusitis and reflux.  His last visit to this clinic was 28 April 2017 at which point in time he utilized a prolonged course of systemic steroids and clindamycin for 20 days.  He is doing much better regarding both the issues with his head and chest.  He does not use a short acting bronchodilator more than twice a week at this point in time.  He does not really exercise to any degree.  He does find that he is somewhat short of breath if he exerts himself to any degree.  He has not been having any problems with reflux.  He did receive the flu vaccine.  Allergies as of 05/19/2017      Reactions   Ramipril    REACTION: chest congestion & cough. No angioedema      Medication List      albuterol 108 (90 Base) MCG/ACT inhaler Commonly known as:  PROVENTIL HFA Inhale 2 puffs into the lungs every 4 (four) hours as needed for wheezing or shortness of breath. 1-2 puffs every 4 hrs as needed.   allopurinol 300 MG tablet Commonly known as:  ZYLOPRIM TAKE 1 TABLET (300 MG TOTAL) BY MOUTH DAILY.   atorvastatin 20 MG tablet Commonly known as:  LIPITOR TAKE 1 TABLET (20 MG TOTAL) BY MOUTH DAILY.   bimatoprost 0.03 % ophthalmic solution Commonly known as:  LUMIGAN Place 1 drop into both eyes at bedtime.   BREO ELLIPTA 200-25 MCG/INH Aepb Generic drug:  fluticasone furoate-vilanterol TAKE 1 PUFF BY MOUTH EVERY DAY   brimonidine 0.1 % Soln Commonly known as:  ALPHAGAN P 1 drop 2 (two) times daily. Both eyes   cefdinir 300 MG capsule Commonly known as:  OMNICEF Take 1  capsule (300 mg total) by mouth 2 (two) times daily.   DEXILANT 60 MG capsule Generic drug:  dexlansoprazole TAKE 1 CAPSULE (60 MG TOTAL) BY MOUTH DAILY.   dextromethorphan-guaiFENesin 30-600 MG 12hr tablet Commonly known as:  MUCINEX DM Take 1 tablet by mouth daily as needed for cough.   EPINEPHrine 0.3 mg/0.3 mL Soaj injection Commonly known as:  EPI-PEN Inject 0.3 mLs (0.3 mg total) into the muscle once.   FLUoxetine 10 MG capsule Commonly known as:  PROZAC TAKE 1 CAPSULE (10 MG TOTAL) BY MOUTH DAILY.   FLUZONE HIGH-DOSE 0.5 ML injection Generic drug:  Influenza vac split quadrivalent PF TO BE ADMINISTERED BY PHARMACIST FOR IMMUNIZATION   montelukast 10 MG tablet Commonly known as:  SINGULAIR Take 1 tablet (10 mg total) by mouth at bedtime.   olmesartan-hydrochlorothiazide 40-12.5 MG tablet Commonly known as:  BENICAR HCT TAKE 0.5 TABLETS BY MOUTH DAILY.   psyllium 58.6 % powder Commonly known as:  METAMUCIL Take 1 packet by mouth daily.   XOLAIR 150 MG injection Generic drug:  omalizumab Inject 150 mg into the skin every 14 (fourteen) days.       Past Medical History:  Diagnosis Date  . Asthma   . COPD (chronic obstructive pulmonary disease) (HCC)    reactive airway disease  . Glaucoma   . Gout   .  Hx of colonic polyps    Dr Earlean Shawl  . Hyperlipidemia    NMR lipoprofile 2005: LDL 113(1416/825), HLD 37, TG 190.  [name] Kitchen Hypertension   . Pneumonia     Past Surgical History:  Procedure Laterality Date  . CATARACT EXTRACTION     bilat, implants  . CHOLECYSTECTOMY  1995  . LUMBAR LAMINECTOMY  1982  . POLYPECTOMY  2003    neg 2006, Dr.Medoff  . tear duct surgery  1998   for excess tearing, skin graft L foot @ age 66 1/2    Review of systems negative except as noted in HPI / PMHx or noted below:  Review of Systems  Constitutional: Negative.   HENT: Negative.   Eyes: Negative.   Respiratory: Negative.   Cardiovascular: Negative.   Gastrointestinal:  Negative.   Genitourinary: Negative.   Musculoskeletal: Negative.   Skin: Negative.   Neurological: Negative.   Endo/Heme/Allergies: Negative.   Psychiatric/Behavioral: Negative.      Objective:   Vitals:   05/19/17 1008  BP: 140/80  Pulse: 84  Resp: 20          Physical Exam  Constitutional: He is well-developed, well-nourished, and in no distress.  HENT:  Head: Normocephalic.  Right Ear: Tympanic membrane, external ear and ear canal normal.  Left Ear: Tympanic membrane, external ear and ear canal normal.  Nose: Nose normal. No mucosal edema or rhinorrhea.  Mouth/Throat: Uvula is midline, oropharynx is clear and moist and mucous membranes are normal. No oropharyngeal exudate.  Eyes: Conjunctivae are normal.  Neck: Trachea normal. No tracheal tenderness present. No tracheal deviation present. No thyromegaly present.  Cardiovascular: Normal rate, regular rhythm, S1 normal, S2 normal and normal heart sounds.  No murmur heard. Pulmonary/Chest: Breath sounds normal. No stridor. No respiratory distress. He has no wheezes. He has no rales.  Musculoskeletal: He exhibits no edema.  Lymphadenopathy:       Head (right side): No tonsillar adenopathy present.       Head (left side): No tonsillar adenopathy present.    He has no cervical adenopathy.  Neurological: He is alert. Gait normal.  Skin: No rash noted. He is not diaphoretic. No erythema. Nails show no clubbing.  Psychiatric: Mood and affect normal.    Diagnostics:    Spirometry was performed and demonstrated an FEV1 of 1.97 at 64 % of predicted.  The patient had an Asthma Control Test with the following results: ACT Total Score: 13.    Assessment and Plan:   1. COPD with asthma ([place])   2. Other allergic rhinitis   3. Chronic pansinusitis   4. LPRD (laryngopharyngeal reflux disease)     1. Continue to Treat inflammation:   A. Continue Breo 200 one inhalation one time per day  B. OTC Nasacort one spray each  nostril one time per day  C. Xolair and EpiPen    2. Continue to Treat reflux:   A. Dexilant 60 mg in the a.m.  B. Continue off all caffeine and chocolate  3. If needed:   A. ProAir HFA 2 puffs every 4-6 hours  B. nasal saline washes  C. OTC antihistamine  D. Mucinex DM 2 tablets twice a day  E. Duoneb nebulized every 4-6 hours if needed.  4. Return to clinic in 12 weeks or earlier if problem  Abhishek finally appears to be doing quite well regarding his respiratory tract issue which is a combination of inflammation and infection and reflux induced respiratory disease on his current  plan.  The only additional therapy we discussed today was to undergo a progressive exercise routine which will probably help his cardiopulmonary status in general.  He will continue on the medical therapy noted above and I will see him back in this clinic in 12 weeks or earlier if there is a problem.  Allena Katz, MD Allergy / Immunology [place]

## 2017-05-20 ENCOUNTER — Encounter: Payer: Self-pay | Admitting: Allergy and Immunology

## 2017-05-25 DIAGNOSIS — J4540 Moderate persistent asthma, uncomplicated: Secondary | ICD-10-CM | POA: Diagnosis not present

## 2017-05-27 ENCOUNTER — Ambulatory Visit (INDEPENDENT_AMBULATORY_CARE_PROVIDER_SITE_OTHER): Payer: Medicare Other | Admitting: Internal Medicine

## 2017-05-27 ENCOUNTER — Ambulatory Visit (INDEPENDENT_AMBULATORY_CARE_PROVIDER_SITE_OTHER): Payer: Medicare Other

## 2017-05-27 ENCOUNTER — Encounter: Payer: Self-pay | Admitting: Internal Medicine

## 2017-05-27 ENCOUNTER — Ambulatory Visit: Payer: Self-pay | Admitting: *Deleted

## 2017-05-27 VITALS — BP 130/70 | HR 97 | Temp 98.0°F | Wt 209.8 lb

## 2017-05-27 DIAGNOSIS — J4540 Moderate persistent asthma, uncomplicated: Secondary | ICD-10-CM

## 2017-05-27 DIAGNOSIS — R509 Fever, unspecified: Secondary | ICD-10-CM | POA: Diagnosis not present

## 2017-05-27 DIAGNOSIS — R1032 Left lower quadrant pain: Secondary | ICD-10-CM | POA: Diagnosis not present

## 2017-05-27 LAB — POCT URINALYSIS DIP (MANUAL ENTRY)
Bilirubin, UA: NEGATIVE
Blood, UA: NEGATIVE
GLUCOSE UA: NEGATIVE mg/dL
LEUKOCYTES UA: NEGATIVE
Nitrite, UA: NEGATIVE
UROBILINOGEN UA: 0.2 U/dL
pH, UA: 5.5 (ref 5.0–8.0)

## 2017-05-27 MED ORDER — AMOXICILLIN-POT CLAVULANATE 875-125 MG PO TABS: 1.0000 | ORAL_TABLET | Freq: Two times a day (BID) | ORAL | 0 refills | Status: AC

## 2017-05-27 NOTE — Progress Notes (Signed)
No chief complaint on file.   HPI: Edwin [name] 76 y.o.  SDA PCP APPT NA  Hx of asthma allergy copd ht gerd hld  elevvated psa  bph seen by urology a year ago  coming in for abd pain mostly llq  And fever .  Sunday night dec 23  had onset   fever and stomach hurting all over  concerntrated  llq and sometims left chest .   ? Stomach virus and gas pockets    otc meds no help .   nmo begtter  Clammy   In night and poss fevers then and feels weak.   Dizzy and nauseated.    Pain  Up to luq and lower chest? No pleurisy    No hx of same.  Sx  Has had chole Bowel habits changed to . " Hamburger meat that's chopope dup ".   No blood no reg diarrhea  Last colonscopy ? 2011 or so  Due?    ROS: See pertinent positives and negatives per HPI. No hematuria  Of current flare of copd   Last antibiotic   Clindamycin  Prednisone  No diarrhea  No uti sx   Past Medical History:  Diagnosis Date  . Asthma   . COPD (chronic obstructive pulmonary disease) (HCC)    reactive airway disease  . Glaucoma   . Gout   . Hx of colonic polyps    Dr Earlean Shawl  . Hyperlipidemia    NMR lipoprofile 2005: LDL 113(1416/825), HLD 37, TG 190.  [name] Kitchen Hypertension   . Pneumonia     Family History  Problem Relation Age of Onset  . Heart attack Father 25       due to asthma flare  . Asthma Father   . Emphysema Father   . Hypertension Mother   . Cancer Mother         ? primary  . Asthma Son        x2    Social History   Socioeconomic History  . Marital status: Married    Spouse name: None  . Number of children: 2  . Years of education: None  . Highest education level: None  Social Needs  . Financial resource strain: None  . Food insecurity - worry: None  . Food insecurity - inability: None  . Transportation needs - medical: None  . Transportation needs - non-medical: None  Occupational History  . Occupation: Charity fundraiser  . Occupation: PRESIDENT    Employer: [employer]  Tobacco Use  . Smoking  status: Former Smoker    Packs/day: 2.00    Years: 25.00    Pack years: 50.00    Types: Cigarettes    Last attempt to quit: 06/03/1983    Years since quitting: 34.0  . Smokeless tobacco: Never Used  . Tobacco comment: up to 2 ppd  Substance and Sexual Activity  . Alcohol use: No    Comment: socially; 6-8 / week  . Drug use: No  . Sexual activity: Yes  Other Topics Concern  . None  Social History Narrative   Regular Exercise- no          Outpatient Medications Prior to Visit  Medication Sig Dispense Refill  . albuterol (PROVENTIL HFA) 108 (90 BASE) MCG/ACT inhaler Inhale 2 puffs into the lungs every 4 (four) hours as needed for wheezing or shortness of breath. 1-2 puffs every 4 hrs as needed. 1 Inhaler 0  . allopurinol (ZYLOPRIM) 300 MG tablet  TAKE 1 TABLET (300 MG TOTAL) BY MOUTH DAILY. 90 tablet 1  . atorvastatin (LIPITOR) 20 MG tablet TAKE 1 TABLET (20 MG TOTAL) BY MOUTH DAILY. 90 tablet 1  . bimatoprost (LUMIGAN) 0.03 % ophthalmic solution Place 1 drop into both eyes at bedtime.      [name] Kitchen BREO ELLIPTA 200-25 MCG/INH AEPB TAKE 1 PUFF BY MOUTH EVERY DAY 60 each 0  . brimonidine (ALPHAGAN P) 0.1 % SOLN 1 drop 2 (two) times daily. Both eyes     . cefdinir (OMNICEF) 300 MG capsule Take 1 capsule (300 mg total) by mouth 2 (two) times daily. 20 capsule 1  . DEXILANT 60 MG capsule TAKE 1 CAPSULE (60 MG TOTAL) BY MOUTH DAILY. 90 capsule 1  . dextromethorphan-guaiFENesin (MUCINEX DM) 30-600 MG 12hr tablet Take 1 tablet by mouth daily as needed for cough.     . EPINEPHrine 0.3 mg/0.3 mL IJ SOAJ injection Inject 0.3 mLs (0.3 mg total) into the muscle once. 2 Device 1  . FLUoxetine (PROZAC) 10 MG capsule TAKE 1 CAPSULE (10 MG TOTAL) BY MOUTH DAILY. 90 capsule 1  . FLUZONE HIGH-DOSE 0.5 ML injection TO BE ADMINISTERED BY PHARMACIST FOR IMMUNIZATION  0  . montelukast (SINGULAIR) 10 MG tablet Take 1 tablet (10 mg total) by mouth at bedtime. 30 tablet 0  . olmesartan-hydrochlorothiazide (BENICAR  HCT) 40-12.5 MG tablet TAKE 0.5 TABLETS BY MOUTH DAILY. 30 tablet 1  . omalizumab (XOLAIR) 150 MG injection Inject 150 mg into the skin every 14 (fourteen) days.    [name] Kitchen psyllium (METAMUCIL) 58.6 % powder Take 1 packet by mouth daily.       Facility-Administered Medications Prior to Visit  Medication Dose Route Frequency Provider Last Rate Last Dose  . omalizumab Arvid Right) injection 375 mg  375 mg Subcutaneous Q14 Days Kozlow, Donnamarie Poag, MD   375 mg at 05/27/17 1021     EXAM:  BP 130/70 (BP Location: Left Arm, Patient Position: Sitting, Cuff Size: Normal)   Pulse 97   Temp 98 F (36.7 C) (Oral)   Wt 209 lb 12.8 oz (95.2 kg)   SpO2 95%   BMI 31.90 kg/m   Body mass index is 31.9 kg/m.  GENERAL: vitals reviewed and listed above, alert, oriented, appears well hydrated and in no acute distress   Ill non toxic   HEENT: atraumatic, conjunctiva  clear, no obvious abnormalities on inspection of external nose and ears OP : no lesion edema or exudate  NECK: no obvious masses on inspection palpation  LUNGS: clear to auscultation bilaterally, no wheezes, rales or rhonchi, dec  air movement CV: HRRR, no clubbing cyanosis or  peripheral edema nl cap refill  Abdomen:  Dec  bowel sounds ? wo t hepatosplenomegaly,++Tender  llq and ruq area  ? no rebound or masses obvious or fluid wave  no CVA tenderness MS: moves all extremities without noticeable focal  abnormality PSYCH: pleasant and cooperative, no obvious depression or anxiety Lab Results  Component Value Date   WBC 9.1 01/23/2016   HGB 15.9 01/23/2016   HCT 46.4 01/23/2016   PLT 345.0 01/23/2016   GLUCOSE 114 (H) 01/23/2016   CHOL 187 03/03/2016   TRIG 256.0 (H) 03/03/2016   HDL 50.10 03/03/2016   LDLDIRECT 102.0 03/03/2016   LDLCALC 84 10/03/2013   ALT 20 01/23/2016   AST 22 01/23/2016   NA 136 01/23/2016   K 4.0 01/23/2016   CL 98 01/23/2016   CREATININE 1.50 01/23/2016   BUN 21 01/23/2016  CO2 30 01/23/2016   TSH 1.43 03/03/2016     PSA 0.87 05/05/2016   HGBA1C 5.6 01/23/2016     ASSESSMENT AND PLAN:  Discussed the following assessment and plan:  Abdominal pain, LLQ (left lower quadrant) - Plan: POCT urinalysis dipstick, CT Abdomen Pelvis W Contrast, CMP, CBC with Differential/Platelet, CANCELED: CBC with Differential/Platelet, CANCELED: CMP  Fever, unspecified fever cause - Plan: POCT urinalysis dipstick, CT Abdomen Pelvis W Contrast, CMP, CBC with Differential/Platelet, CANCELED: CBC with Differential/Platelet, CANCELED: CMP  Left lower quadrant pain - Plan: CT Abdomen Pelvis W Contrast Exam and history consistent with acute process.  Consideration of acute diverticulitis with fever left lower quadrant pain but somewhat more diffuse abdominal pain than expected.  Empiric rx and lab at this time   Until delineated cause of sx  He has risk for C. difficile with history of recent antibiotics including clindamycin but does not really have frequent diarrhea. In this afternoon today CT scan ordered stat but no not able to be scheduled because there is noone available  who can get the prior authorizationat this time.  [facility] was called directly   Discussed this with patient  And wife hopefully tomorrow someone will be able to schedule this however if he gets worse with increased abdominal pain over the evening he should seek care in the emergency room.  Where precertification is not needed to get a scan.  Total visit 27mins > 50% spent counseling and coordinating care as indicated in above note and in instructions to patient .  Working on   Chief Operating Officer. For ed care if getting worse .   -Patient advised to return or notify health care team  if symptoms worsen ,persist or new concerns arise.  Patient Instructions  Suspect  Diverticulitis     Blood tests and abd pelvic ct scan ordered  In the interim  begoin  augmentin for poss diverticuloitis   You have risk for c difficile  infection but that usually  Has lots of diarrhea and treated a different meds.    If get worse in night  Seek care in the ed for  Further more ugent evaulation and treatment .   Advise  Fu visit with PCP office in  2 -3 days  Depending on results of evaluation.       Diverticulitis Diverticulitis is infection or inflammation of small pouches (diverticula) in the colon that form due to a condition called diverticulosis. Diverticula can trap stool (feces) and bacteria, causing infection and inflammation. Diverticulitis may cause severe stomach pain and diarrhea. It may lead to tissue damage in the colon that causes bleeding. The diverticula may also burst (rupture) and cause infected stool to enter other areas of the abdomen. Complications of diverticulitis can include:  Bleeding.  Severe infection.  Severe pain.  Rupture (perforation) of the colon.  Blockage (obstruction) of the colon.  What are the causes? This condition is caused by stool becoming trapped in the diverticula, which allows bacteria to grow in the diverticula. This leads to inflammation and infection. What increases the risk? You are more likely to develop this condition if:  You have diverticulosis. The risk for diverticulosis increases if: ? You are overweight or obese. ? You use tobacco products. ? You do not get enough exercise.  You eat a diet that does not include enough fiber. High-fiber foods include fruits, vegetables, beans, nuts, and whole grains.  What are the signs or symptoms?  Symptoms of this condition may include:  Pain and tenderness in the abdomen. The pain is normally located on the left side of the abdomen, but it may occur in other areas.  Fever and chills.  Bloating.  Cramping.  Nausea.  Vomiting.  Changes in bowel routines.  Blood in your stool.  How is this diagnosed? This condition is diagnosed based on:  Your medical history.  A physical exam.  Tests to make sure  there is nothing else causing your condition. These tests may include: ? Blood tests. ? Urine tests. ? Imaging tests of the abdomen, including X-rays, ultrasounds, MRIs, or CT scans.  How is this treated? Most cases of this condition are mild and can be treated at home. Treatment may include:  Taking over-the-counter pain medicines.  Following a clear liquid diet.  Taking antibiotic medicines by mouth.  Rest.  More severe cases may need to be treated at a hospital. Treatment may include:  Not eating or drinking.  Taking prescription pain medicine.  Receiving antibiotic medicines through an IV tube.  Receiving fluids and nutrition through an IV tube.  Surgery.  When your condition is under control, your health care provider may recommend that you have a colonoscopy. This is an exam to look at the entire large intestine. During the exam, a lubricated, bendable tube is inserted into the anus and then passed into the rectum, colon, and other parts of the large intestine. A colonoscopy can show how severe your diverticula are and whether something else may be causing your symptoms. Follow these instructions at home: Medicines  Take over-the-counter and prescription medicines only as told by your health care provider. These include fiber supplements, probiotics, and stool softeners.  If you were prescribed an antibiotic medicine, take it as told by your health care provider. Do not stop taking the antibiotic even if you start to feel better.  Do not drive or use heavy machinery while taking prescription pain medicine. General instructions  Follow a full liquid diet or another diet as directed by your health care provider. After your symptoms improve, your health care provider may tell you to change your diet. He or she may recommend that you eat a diet that contains at least 25 g (25 grams) of fiber daily. Fiber makes it easier to pass stool. Healthy sources of fiber  include: ? Berries. One cup contains 4-8 grams of fiber. ? Beans or lentils. One half cup contains 5-8 grams of fiber. ? Green vegetables. One cup contains 4 grams of fiber.  Exercise for at least 30 minutes, 3 times each week. You should exercise hard enough to raise your heart rate and break a sweat.  Keep all follow-up visits as told by your health care provider. This is important. You may need a colonoscopy. Contact a health care provider if:  Your pain does not improve.  You have a hard time drinking or eating food.  Your bowel movements do not return to normal. Get help right away if:  Your pain gets worse.  Your symptoms do not get better with treatment.  Your symptoms suddenly get worse.  You have a fever.  You vomit more than one time.  You have stools that are bloody, black, or tarry. Summary  Diverticulitis is infection or inflammation of small pouches (diverticula) in the colon that form due to a condition called diverticulosis. Diverticula can trap stool (feces) and bacteria, causing infection and inflammation.  You are at higher risk for this condition  if you have diverticulosis and you eat a diet that does not include enough fiber.  Most cases of this condition are mild and can be treated at home. More severe cases may need to be treated at a hospital.  When your condition is under control, your health care provider may recommend that you have an exam called a colonoscopy. This exam can show how severe your diverticula are and whether something else may be causing your symptoms. This information is not intended to replace advice given to you by your health care provider. Make sure you discuss any questions you have with your health care provider. Document Released: 02/26/2005 Document Revised: 06/21/2016 Document Reviewed: 06/21/2016 Elsevier Interactive Patient Education  2018 [employer]. Panosh M.D.

## 2017-05-27 NOTE — Patient Instructions (Addendum)
Suspect  Diverticulitis     Blood tests and abd pelvic ct scan ordered  In the interim  begoin  augmentin for poss diverticuloitis   You have risk for c difficile infection but that usually  Has lots of diarrhea and treated a different meds.    If get worse in night  Seek care in the ed for  Further more ugent evaulation and treatment .   Advise  Fu visit with PCP office in  2 -3 days  Depending on results of evaluation.       Diverticulitis Diverticulitis is infection or inflammation of small pouches (diverticula) in the colon that form due to a condition called diverticulosis. Diverticula can trap stool (feces) and bacteria, causing infection and inflammation. Diverticulitis may cause severe stomach pain and diarrhea. It may lead to tissue damage in the colon that causes bleeding. The diverticula may also burst (rupture) and cause infected stool to enter other areas of the abdomen. Complications of diverticulitis can include:  Bleeding.  Severe infection.  Severe pain.  Rupture (perforation) of the colon.  Blockage (obstruction) of the colon.  What are the causes? This condition is caused by stool becoming trapped in the diverticula, which allows bacteria to grow in the diverticula. This leads to inflammation and infection. What increases the risk? You are more likely to develop this condition if:  You have diverticulosis. The risk for diverticulosis increases if: ? You are overweight or obese. ? You use tobacco products. ? You do not get enough exercise.  You eat a diet that does not include enough fiber. High-fiber foods include fruits, vegetables, beans, nuts, and whole grains.  What are the signs or symptoms? Symptoms of this condition may include:  Pain and tenderness in the abdomen. The pain is normally located on the left side of the abdomen, but it may occur in other areas.  Fever and chills.  Bloating.  Cramping.  Nausea.  Vomiting.  Changes in  bowel routines.  Blood in your stool.  How is this diagnosed? This condition is diagnosed based on:  Your medical history.  A physical exam.  Tests to make sure there is nothing else causing your condition. These tests may include: ? Blood tests. ? Urine tests. ? Imaging tests of the abdomen, including X-rays, ultrasounds, MRIs, or CT scans.  How is this treated? Most cases of this condition are mild and can be treated at home. Treatment may include:  Taking over-the-counter pain medicines.  Following a clear liquid diet.  Taking antibiotic medicines by mouth.  Rest.  More severe cases may need to be treated at a hospital. Treatment may include:  Not eating or drinking.  Taking prescription pain medicine.  Receiving antibiotic medicines through an IV tube.  Receiving fluids and nutrition through an IV tube.  Surgery.  When your condition is under control, your health care provider may recommend that you have a colonoscopy. This is an exam to look at the entire large intestine. During the exam, a lubricated, bendable tube is inserted into the anus and then passed into the rectum, colon, and other parts of the large intestine. A colonoscopy can show how severe your diverticula are and whether something else may be causing your symptoms. Follow these instructions at home: Medicines  Take over-the-counter and prescription medicines only as told by your health care provider. These include fiber supplements, probiotics, and stool softeners.  If you were prescribed an antibiotic medicine, take it as told by your health care  provider. Do not stop taking the antibiotic even if you start to feel better.  Do not drive or use heavy machinery while taking prescription pain medicine. General instructions  Follow a full liquid diet or another diet as directed by your health care provider. After your symptoms improve, your health care provider may tell you to change your diet. He or  she may recommend that you eat a diet that contains at least 25 g (25 grams) of fiber daily. Fiber makes it easier to pass stool. Healthy sources of fiber include: ? Berries. One cup contains 4-8 grams of fiber. ? Beans or lentils. One half cup contains 5-8 grams of fiber. ? Green vegetables. One cup contains 4 grams of fiber.  Exercise for at least 30 minutes, 3 times each week. You should exercise hard enough to raise your heart rate and break a sweat.  Keep all follow-up visits as told by your health care provider. This is important. You may need a colonoscopy. Contact a health care provider if:  Your pain does not improve.  You have a hard time drinking or eating food.  Your bowel movements do not return to normal. Get help right away if:  Your pain gets worse.  Your symptoms do not get better with treatment.  Your symptoms suddenly get worse.  You have a fever.  You vomit more than one time.  You have stools that are bloody, black, or tarry. Summary  Diverticulitis is infection or inflammation of small pouches (diverticula) in the colon that form due to a condition called diverticulosis. Diverticula can trap stool (feces) and bacteria, causing infection and inflammation.  You are at higher risk for this condition if you have diverticulosis and you eat a diet that does not include enough fiber.  Most cases of this condition are mild and can be treated at home. More severe cases may need to be treated at a hospital.  When your condition is under control, your health care provider may recommend that you have an exam called a colonoscopy. This exam can show how severe your diverticula are and whether something else may be causing your symptoms. This information is not intended to replace advice given to you by your health care provider. Make sure you discuss any questions you have with your health care provider. Document Released: 02/26/2005 Document Revised: 06/21/2016  Document Reviewed: 06/21/2016 Elsevier Interactive Patient Education  [employer].

## 2017-05-27 NOTE — Telephone Encounter (Signed)
Patient started Sunday night with fever,sweating, and nausea. These symptoms have continued since Sunday with added pressure in stomach. Patient had slight diarrhea - but has not eaten a lot. Some dizziness and weakness.  Answer Assessment - Initial Assessment Questions 1. LOCATION: "Where does it hurt?"      Left lower quadrant- radiates up like air pressure into chest 2. RADIATION: "Does the pain shoot anywhere else?" (e.g., chest, back)     stomach 3. ONSET: "When did the pain begin?" (Minutes, hours or days ago)      Sunday night 4. SUDDEN: "Gradual or sudden onset?"     sudden 5. PATTERN "Does the pain come and go, or is it constant?"    - If constant: "Is it getting better, staying the same, or worsening?"      (Note: Constant means the pain never goes away completely; most serious pain is constant and it progresses)     - If intermittent: "How long does it last?" "Do you have pain now?"     (Note: Intermittent means the pain goes away completely between bouts)     Constant- varies in degree- nothing makes it better- laying down helps a little 6. SEVERITY: "How bad is the pain?"  (e.g., Scale 1-10; mild, moderate, or severe)    - MILD (1-3): doesn't interfere with normal activities, abdomen soft and not tender to touch     - MODERATE (4-7): interferes with normal activities or awakens from sleep, tender to touch     - SEVERE (8-10): excruciating pain, doubled over, unable to do any normal activities       It does intensify- but not tied to any reason-cause 7. RECURRENT SYMPTOM: "Have you ever had this type of abdominal pain before?" If so, ask: "When was the last time?" and "What happened that time?"      no 8. CAUSE: "What do you think is causing the abdominal pain?"     no 9. RELIEVING/AGGRAVATING FACTORS: "What makes it better or worse?" (e.g., movement, antacids, bowel movement)     No- patient has tried different medications 10. OTHER SYMPTOMS: "Has there been any vomiting,  diarrhea, constipation, or urine problems?"       Dizziness, weakness, more SOB- hx COPD  Protocols used: ABDOMINAL PAIN - MALE-A-AH

## 2017-05-28 LAB — CBC WITH DIFFERENTIAL/PLATELET
BASOS ABS: 44 {cells}/uL (ref 0–200)
Basophils Relative: 0.4 %
EOS ABS: 98 {cells}/uL (ref 15–500)
Eosinophils Relative: 0.9 %
HCT: 43.8 % (ref 38.5–50.0)
Hemoglobin: 15 g/dL (ref 13.2–17.1)
Lymphs Abs: 2605 cells/uL (ref 850–3900)
MCH: 32.3 pg (ref 27.0–33.0)
MCHC: 34.2 g/dL (ref 32.0–36.0)
MCV: 94.2 fL (ref 80.0–100.0)
MONOS PCT: 8.5 %
MPV: 12.1 fL (ref 7.5–12.5)
NEUTROS PCT: 66.3 %
Neutro Abs: 7227 cells/uL (ref 1500–7800)
PLATELETS: 181 10*3/uL (ref 140–400)
RBC: 4.65 10*6/uL (ref 4.20–5.80)
RDW: 13.8 % (ref 11.0–15.0)
TOTAL LYMPHOCYTE: 23.9 %
WBC: 10.9 10*3/uL — ABNORMAL HIGH (ref 3.8–10.8)
WBCMIX: 927 {cells}/uL (ref 200–950)

## 2017-05-28 LAB — COMPREHENSIVE METABOLIC PANEL
AG RATIO: 1.7 (calc) (ref 1.0–2.5)
ALBUMIN MSPROF: 4.5 g/dL (ref 3.6–5.1)
ALT: 14 U/L (ref 9–46)
AST: 18 U/L (ref 10–35)
Alkaline phosphatase (APISO): 55 U/L (ref 40–115)
BUN / CREAT RATIO: 14 (calc) (ref 6–22)
BUN: 18 mg/dL (ref 7–25)
CHLORIDE: 99 mmol/L (ref 98–110)
CO2: 26 mmol/L (ref 20–32)
CREATININE: 1.26 mg/dL — AB (ref 0.70–1.18)
Calcium: 9.4 mg/dL (ref 8.6–10.3)
GLOBULIN: 2.6 g/dL (ref 1.9–3.7)
GLUCOSE: 120 mg/dL — AB (ref 65–99)
POTASSIUM: 3.7 mmol/L (ref 3.5–5.3)
SODIUM: 141 mmol/L (ref 135–146)
Total Bilirubin: 0.9 mg/dL (ref 0.2–1.2)
Total Protein: 7.1 g/dL (ref 6.1–8.1)

## 2017-05-29 ENCOUNTER — Ambulatory Visit: Payer: Medicare Other | Admitting: Internal Medicine

## 2017-05-29 ENCOUNTER — Ambulatory Visit (INDEPENDENT_AMBULATORY_CARE_PROVIDER_SITE_OTHER)
Admission: RE | Admit: 2017-05-29 | Discharge: 2017-05-29 | Disposition: A | Discharge status: Home / Self Care | Payer: Medicare Other | Source: Ambulatory Visit | Attending: Internal Medicine | Admitting: Internal Medicine

## 2017-05-29 DIAGNOSIS — K5730 Diverticulosis of large intestine without perforation or abscess without bleeding: Secondary | ICD-10-CM | POA: Diagnosis not present

## 2017-05-29 DIAGNOSIS — R509 Fever, unspecified: Secondary | ICD-10-CM

## 2017-05-29 DIAGNOSIS — R1032 Left lower quadrant pain: Secondary | ICD-10-CM | POA: Diagnosis not present

## 2017-05-29 MED ORDER — IOPAMIDOL (ISOVUE-300) INJECTION 61%
100.0000 mL | Freq: Once | INTRAVENOUS | Status: AC | PRN
  Administered 2017-05-29: 100 mL via INTRAVENOUS

## 2017-06-03 ENCOUNTER — Ambulatory Visit (INDEPENDENT_AMBULATORY_CARE_PROVIDER_SITE_OTHER): Payer: Medicare Other | Admitting: Internal Medicine

## 2017-06-03 ENCOUNTER — Other Ambulatory Visit (INDEPENDENT_AMBULATORY_CARE_PROVIDER_SITE_OTHER): Payer: Medicare Other

## 2017-06-03 ENCOUNTER — Encounter: Payer: Self-pay | Admitting: Internal Medicine

## 2017-06-03 ENCOUNTER — Ambulatory Visit (INDEPENDENT_AMBULATORY_CARE_PROVIDER_SITE_OTHER)
Admission: RE | Admit: 2017-06-03 | Discharge: 2017-06-03 | Disposition: A | Discharge status: Home / Self Care | Payer: Medicare Other | Source: Ambulatory Visit | Attending: Internal Medicine | Admitting: Internal Medicine

## 2017-06-03 VITALS — BP 112/68 | HR 90 | Temp 98.2°F | Resp 16 | Ht 68.0 in | Wt 211.2 lb

## 2017-06-03 DIAGNOSIS — R739 Hyperglycemia, unspecified: Secondary | ICD-10-CM

## 2017-06-03 DIAGNOSIS — I10 Essential (primary) hypertension: Secondary | ICD-10-CM

## 2017-06-03 DIAGNOSIS — K5733 Diverticulitis of large intestine without perforation or abscess with bleeding: Secondary | ICD-10-CM

## 2017-06-03 DIAGNOSIS — J189 Pneumonia, unspecified organism: Secondary | ICD-10-CM | POA: Insufficient documentation

## 2017-06-03 DIAGNOSIS — R1032 Left lower quadrant pain: Secondary | ICD-10-CM | POA: Diagnosis not present

## 2017-06-03 DIAGNOSIS — J181 Lobar pneumonia, unspecified organism: Secondary | ICD-10-CM | POA: Diagnosis not present

## 2017-06-03 LAB — CBC WITH DIFFERENTIAL/PLATELET
BASOS ABS: 0 10*3/uL (ref 0.0–0.1)
Basophils Relative: 0.4 % (ref 0.0–3.0)
EOS ABS: 0.2 10*3/uL (ref 0.0–0.7)
Eosinophils Relative: 1.9 % (ref 0.0–5.0)
HEMATOCRIT: 44.1 % (ref 39.0–52.0)
HEMOGLOBIN: 14.7 g/dL (ref 13.0–17.0)
LYMPHS PCT: 29.5 % (ref 12.0–46.0)
Lymphs Abs: 2.7 10*3/uL (ref 0.7–4.0)
MCHC: 33.2 g/dL (ref 30.0–36.0)
MCV: 97.9 fl (ref 78.0–100.0)
Monocytes Absolute: 0.8 10*3/uL (ref 0.1–1.0)
Monocytes Relative: 8.4 % (ref 3.0–12.0)
Neutro Abs: 5.5 10*3/uL (ref 1.4–7.7)
Neutrophils Relative %: 59.8 % (ref 43.0–77.0)
Platelets: 268 10*3/uL (ref 150.0–400.0)
RBC: 4.51 Mil/uL (ref 4.22–5.81)
RDW: 15.2 % (ref 11.5–15.5)
WBC: 9.1 10*3/uL (ref 4.0–10.5)

## 2017-06-03 LAB — BASIC METABOLIC PANEL
BUN: 16 mg/dL (ref 6–23)
CO2: 29 mEq/L (ref 19–32)
CREATININE: 1.1 mg/dL (ref 0.40–1.50)
Calcium: 9.4 mg/dL (ref 8.4–10.5)
Chloride: 101 mEq/L (ref 96–112)
GFR: 69.2 mL/min (ref >60.00)
Glucose, Bld: 102 mg/dL — ABNORMAL HIGH (ref 70–99)
Potassium: 3.7 mEq/L (ref 3.5–5.1)
Sodium: 141 mEq/L (ref 135–145)

## 2017-06-03 LAB — HEMOGLOBIN A1C: HEMOGLOBIN A1C: 6 % (ref 4.6–6.5)

## 2017-06-03 MED ORDER — OLMESARTAN MEDOXOMIL-HCTZ 40-12.5 MG PO TABS: 0.5000 | ORAL_TABLET | Freq: Every day | ORAL | 1 refills | Status: DC

## 2017-06-03 NOTE — Patient Instructions (Signed)

## 2017-06-03 NOTE — Progress Notes (Signed)
Subjective:  Patient ID: [name], male    DOB: 1941-02-04  Age: 77 y.o. MRN: [id number]  CC: Cough and Abdominal Pain   HPI ERNESTINE LANGWORTHY presents for f/up - he was recently seen in the ED for abdominal pain and cough and was found to have left lower lobe pneumonia and a mild case of diverticulitis.  He tells me he is feeling much better and is tolerating the antibiotics well.  He has lingering symptoms which include a cough that is productive of clear to yellow phlegm with a few episodes of shortness of breath.  He also has mild abdominal bloating but is maintaining a good appetite.  He has had no recent nausea, vomiting, diarrhea, or bloody stool.  He feels mildly weak but denies chest pain, hemoptysis, or night sweats.  Outpatient Medications Prior to Visit  Medication Sig Dispense Refill  . albuterol (PROVENTIL HFA) 108 (90 BASE) MCG/ACT inhaler Inhale 2 puffs into the lungs every 4 (four) hours as needed for wheezing or shortness of breath. 1-2 puffs every 4 hrs as needed. 1 Inhaler 0  . allopurinol (ZYLOPRIM) 300 MG tablet TAKE 1 TABLET (300 MG TOTAL) BY MOUTH DAILY. 90 tablet 1  . amoxicillin-clavulanate (AUGMENTIN) 875-125 MG tablet Take 1 tablet by mouth every 12 (twelve) hours for 10 days. For possible diverticulitis 20 tablet 0  . atorvastatin (LIPITOR) 20 MG tablet TAKE 1 TABLET (20 MG TOTAL) BY MOUTH DAILY. 90 tablet 1  . bimatoprost (LUMIGAN) 0.03 % ophthalmic solution Place 1 drop into both eyes at bedtime.      [name] Kitchen BREO ELLIPTA 200-25 MCG/INH AEPB TAKE 1 PUFF BY MOUTH EVERY DAY 60 each 0  . brimonidine (ALPHAGAN P) 0.1 % SOLN 1 drop 2 (two) times daily. Both eyes     . cefdinir (OMNICEF) 300 MG capsule Take 1 capsule (300 mg total) by mouth 2 (two) times daily. 20 capsule 1  . DEXILANT 60 MG capsule TAKE 1 CAPSULE (60 MG TOTAL) BY MOUTH DAILY. 90 capsule 1  . dextromethorphan-guaiFENesin (MUCINEX DM) 30-600 MG 12hr tablet Take 1 tablet by mouth daily as needed for cough.      . EPINEPHrine 0.3 mg/0.3 mL IJ SOAJ injection Inject 0.3 mLs (0.3 mg total) into the muscle once. 2 Device 1  . FLUoxetine (PROZAC) 10 MG capsule TAKE 1 CAPSULE (10 MG TOTAL) BY MOUTH DAILY. 90 capsule 1  . LUMIGAN 0.01 % SOLN     . montelukast (SINGULAIR) 10 MG tablet Take 1 tablet (10 mg total) by mouth at bedtime. 30 tablet 0  . omalizumab (XOLAIR) 150 MG injection Inject 150 mg into the skin every 14 (fourteen) days.    . predniSONE (DELTASONE) 10 MG tablet PLEASE SEE ATTACHED FOR DETAILED DIRECTIONS  0  . psyllium (METAMUCIL) 58.6 % powder Take 1 packet by mouth daily.      [name] Kitchen FLUZONE HIGH-DOSE 0.5 ML injection TO BE ADMINISTERED BY PHARMACIST FOR IMMUNIZATION  0  . olmesartan-hydrochlorothiazide (BENICAR HCT) 40-12.5 MG tablet TAKE 0.5 TABLETS BY MOUTH DAILY. 30 tablet 1   Facility-Administered Medications Prior to Visit  Medication Dose Route Frequency Provider Last Rate Last Dose  . omalizumab Arvid Right) injection 375 mg  375 mg Subcutaneous Q14 Days Kozlow, Donnamarie Poag, MD   375 mg at 05/27/17 1021    ROS Review of Systems  Constitutional: Negative for activity change, appetite change, chills, diaphoresis, fatigue and fever.  HENT: Negative.  Negative for sore throat and trouble swallowing.  Eyes: Negative.   Respiratory: Positive for cough and shortness of breath. Negative for wheezing.   Cardiovascular: Negative for chest pain, palpitations and leg swelling.  Gastrointestinal: Positive for abdominal pain. Negative for constipation, diarrhea and vomiting.  Endocrine: Negative.   Genitourinary: Negative.  Negative for difficulty urinating.  Musculoskeletal: Negative.  Negative for back pain and myalgias.  Skin: Negative.  Negative for color change, pallor and rash.  Allergic/Immunologic: Negative.   Neurological: Negative.  Negative for dizziness, weakness, light-headedness and headaches.  Hematological: Negative for adenopathy. Does not bruise/bleed easily.    Psychiatric/Behavioral: Negative.     Objective:  BP 112/68 (BP Location: Left Arm, Patient Position: Sitting, Cuff Size: Normal)   Pulse 90   Temp 98.2 F (36.8 C) (Oral)   Resp 16   Ht 5\' 8"  (1.727 m)   Wt 211 lb 4 oz (95.8 kg)   SpO2 97%   BMI 32.12 kg/m   BP Readings from Last 3 Encounters:  06/03/17 112/68  05/27/17 130/70  05/19/17 140/80    Wt Readings from Last 3 Encounters:  06/03/17 211 lb 4 oz (95.8 kg)  05/27/17 209 lb 12.8 oz (95.2 kg)  04/28/17 216 lb (98 kg)    Physical Exam  Constitutional: He is oriented to person, place, and time.  Non-toxic appearance. He does not have a sickly appearance. He does not appear ill. No distress.  HENT:  Mouth/Throat: Oropharynx is clear and moist. No oropharyngeal exudate.  Eyes: Conjunctivae are normal. Right eye exhibits no discharge. Left eye exhibits no discharge. No scleral icterus.  Neck: Normal range of motion. Neck supple. No JVD present. No thyromegaly present.  Cardiovascular: Normal rate, regular rhythm and normal heart sounds.  No murmur heard. Pulmonary/Chest: Effort normal. No respiratory distress. He has no wheezes. He has no rales.  Abdominal: Soft. Normal appearance and bowel sounds are normal. He exhibits no ascites and no mass. There is no hepatosplenomegaly or hepatomegaly. There is no tenderness. There is no rebound and no CVA tenderness.  Musculoskeletal: Normal range of motion. He exhibits no edema or tenderness.  Lymphadenopathy:    He has no cervical adenopathy.  Neurological: He is alert and oriented to person, place, and time.  Skin: Skin is warm and dry. No rash noted. He is not diaphoretic. No erythema. No pallor.  Vitals reviewed.   Lab Results  Component Value Date   WBC 9.1 06/03/2017   HGB 14.7 06/03/2017   HCT 44.1 06/03/2017   PLT 268.0 06/03/2017   GLUCOSE 102 (H) 06/03/2017   CHOL 187 03/03/2016   TRIG 256.0 (H) 03/03/2016   HDL 50.10 03/03/2016   LDLDIRECT 102.0 03/03/2016    LDLCALC 84 10/03/2013   ALT 14 05/27/2017   AST 18 05/27/2017   NA 141 06/03/2017   K 3.7 06/03/2017   CL 101 06/03/2017   CREATININE 1.10 06/03/2017   BUN 16 06/03/2017   CO2 29 06/03/2017   TSH 1.43 03/03/2016   PSA 0.87 05/05/2016   HGBA1C 6.0 06/03/2017    Ct Abdomen Pelvis W Contrast  Result Date: 05/29/2017 CLINICAL DATA:  77 year old male with left lower quadrant abdominal pain and low-grade fever since 05/25/2017. Started antibiotics 2 days ago. Query diverticulitis. EXAM: CT ABDOMEN AND PELVIS WITH CONTRAST TECHNIQUE: Multidetector CT imaging of the abdomen and pelvis was performed using the standard protocol following bolus administration of intravenous contrast. CONTRAST:  139mL ISOVUE-300 IOPAMIDOL (ISOVUE-300) INJECTION 61% COMPARISON:  Hip MRI 01/08/2004.  Chest CTA 01/06/2016 FINDINGS: Lower chest:  Improved lung volumes compared to 2017. Patchy opacity at both lung bases appears to be chronic to a degree, greater on the left. Patchy peribronchial left lower lobe involvement though resembles bronchopneumonia on series 3, image 12. Some of the changes most resemble chronic scarring, including along the right minor fissure. No pericardial or pleural effusion. Calcified coronary artery atherosclerosis. Hepatobiliary: Surgically absent gallbladder. Chronic hepatic steatosis. Pancreas: Negative; partially fatty atrophied. Spleen: Negative. Adrenals/Urinary Tract: Normal adrenal glands. Bilateral renal enhancement and contrast excretion is symmetric and normal. There is a 4-5 mm left lower pole renal calculus. No right nephrolithiasis. Mild bilateral perinephric stranding appears likely age related. No hydronephrosis. Normal left ureter. Negative course also of the right ureter. Diminutive and unremarkable urinary bladder. Stomach/Bowel: Oral contrast has reached the rectum without obstruction. The rectum is decompressed and negative. There is mild to moderate diverticulosis in the  sigmoid colon. The sigmoid wall appears mildly thickened such as on series 2, image 82, but there is no convincing mesenteric inflammation. Negative left colon and transverse colon. Negative right colon and terminal ileum. The appendix is diminutive or absent. No dilated or inflamed small bowel loops. Negative stomach and duodenum ; subtle duodenum diverticulum at the third portion in the midline on series 2, image 46. No abdominal free fluid or free air. Vascular/Lymphatic: Aortoiliac calcified atherosclerosis. Major arterial structures in the abdomen and pelvis are patent. Portal venous system is patent. No lymphadenopathy. Reproductive: Negative. Other: No pelvic free fluid. Musculoskeletal: Osteopenia. Lumbar spine degeneration, especially in the posterior elements. Hip joint degeneration, advanced on the right. No acute osseous abnormality identified. IMPRESSION: 1. Sigmoid diverticulosis with perhaps mild sigmoid wall thickening. Mild or resolving diverticulitis or colitis is possible, but there is no mesenteric inflammation. See also #2. 2. No other acute or inflammatory process in the abdomen or pelvis, however, patchy opacity in the left lower lobe could reflect Bronchopneumonia. No left pleural effusion. Query cough. 3. Aortic Atherosclerosis (ICD10-I70.0). Fatty liver disease. Left nephrolithiasis. Lumbar facet and right hip degeneration. Electronically Signed   By: Genevie Ann M.D.   On: 05/29/2017 10:21  Dg Abd Acute W/chest  Result Date: 06/03/2017 CLINICAL DATA:  Left lower quadrant abdominal pain over the last 10 days. History of diverticulitis. EXAM: DG ABDOMEN ACUTE W/ 1V CHEST COMPARISON:  CT 05/29/2017 FINDINGS: One-view chest shows normal heart and mediastinal shadows. There is mild pulmonary scarring. No free air seen under the diaphragm. Two view abdominal radiographs show normal bowel gas pattern without evidence of ileus, obstruction or free air. Small lower pole renal stone on the left as  seen previously. No acute bone finding. IMPRESSION: Negative abdominal radiographs.  No acute cardiopulmonary disease. Electronically Signed   By: Nelson Chimes M.D.   On: 06/03/2017 10:00      Assessment & Plan:   Wylie was seen today for cough and abdominal pain.  Diagnoses and all orders for this visit:  Essential hypertension- His blood pressure is well controlled.  Electrolytes and renal function are normal. -     CBC with Differential/Platelet; Future -     Basic metabolic panel; Future -     olmesartan-hydrochlorothiazide (BENICAR HCT) 40-12.5 MG tablet; Take 0.5 tablets by mouth daily.  Hyperglycemia- His A1c is up to 6.0%.  He is prediabetic.  Medical therapy is not indicated. -     Basic metabolic panel; Future -     Hemoglobin A1c; Future  Pneumonia of left upper lobe due to infectious organism [facility])- Based on his  symptoms, exam, and chest x-ray this has resolved.  He will complete the course of Augmentin. -     DG Abd Acute W/Chest; Future  Diverticulitis large intestine w/o perforation or abscess w/bleeding- Based on his symptoms, exam, normal white cell count, and plain films this has resolved.  He will complete the course of Augmentin. -     CBC with Differential/Platelet; Future -     DG Abd Acute W/Chest; Future   I have discontinued [place]. I am also having him maintain his bimatoprost, brimonidine, psyllium, albuterol, dextromethorphan-guaiFENesin, EPINEPHrine, omalizumab, montelukast, cefdinir, allopurinol, DEXILANT, atorvastatin, FLUoxetine, BREO ELLIPTA, amoxicillin-clavulanate, predniSONE, LUMIGAN, and olmesartan-hydrochlorothiazide. We will continue to administer omalizumab.  Meds ordered this encounter  Medications  . olmesartan-hydrochlorothiazide (BENICAR HCT) 40-12.5 MG tablet    Sig: Take 0.5 tablets by mouth daily.    Dispense:  90 tablet    Refill:  1     Follow-up: Return in about 3 weeks (around 06/24/2017).  Scarlette Calico, MD

## 2017-06-10 ENCOUNTER — Ambulatory Visit: Payer: Self-pay

## 2017-06-16 DIAGNOSIS — J4540 Moderate persistent asthma, uncomplicated: Secondary | ICD-10-CM | POA: Diagnosis not present

## 2017-06-17 ENCOUNTER — Ambulatory Visit (INDEPENDENT_AMBULATORY_CARE_PROVIDER_SITE_OTHER): Payer: Medicare Other

## 2017-06-17 DIAGNOSIS — J4540 Moderate persistent asthma, uncomplicated: Secondary | ICD-10-CM | POA: Diagnosis not present

## 2017-06-19 ENCOUNTER — Other Ambulatory Visit: Payer: Self-pay | Admitting: Allergy and Immunology

## 2017-06-22 ENCOUNTER — Ambulatory Visit: Payer: Self-pay | Admitting: *Deleted

## 2017-06-22 ENCOUNTER — Ambulatory Visit (INDEPENDENT_AMBULATORY_CARE_PROVIDER_SITE_OTHER): Payer: Medicare Other | Admitting: Internal Medicine

## 2017-06-22 ENCOUNTER — Telehealth: Payer: Self-pay | Admitting: Internal Medicine

## 2017-06-22 ENCOUNTER — Ambulatory Visit: Payer: Medicare Other | Admitting: Family Medicine

## 2017-06-22 ENCOUNTER — Encounter: Payer: Self-pay | Admitting: Internal Medicine

## 2017-06-22 VITALS — BP 120/80 | HR 78 | Temp 98.4°F | Ht 68.0 in | Wt 206.5 lb

## 2017-06-22 DIAGNOSIS — J181 Lobar pneumonia, unspecified organism: Secondary | ICD-10-CM | POA: Diagnosis not present

## 2017-06-22 DIAGNOSIS — J189 Pneumonia, unspecified organism: Secondary | ICD-10-CM

## 2017-06-22 DIAGNOSIS — K5733 Diverticulitis of large intestine without perforation or abscess with bleeding: Secondary | ICD-10-CM | POA: Diagnosis not present

## 2017-06-22 MED ORDER — LEVOFLOXACIN 750 MG PO TABS: 750.0000 mg | ORAL_TABLET | Freq: Every day | ORAL | 0 refills | Status: AC

## 2017-06-22 MED ORDER — PROMETHAZINE HCL 12.5 MG PO TABS: 12.5000 mg | ORAL_TABLET | Freq: Four times a day (QID) | ORAL | 0 refills | Status: DC | PRN

## 2017-06-22 MED ORDER — METRONIDAZOLE 500 MG PO TABS: 500.0000 mg | ORAL_TABLET | Freq: Three times a day (TID) | ORAL | 0 refills | Status: AC

## 2017-06-22 NOTE — Telephone Encounter (Signed)
Pt  Reports  l  Lower  Quadrant   Pain  X  3   Days     History  Of  Diverticulitis        Some  Nausea     Seen  For  Similar 3  Weeks  Ago .tried  To  Make  An  Appointment  With  pcp  . No  Availability     Appointment  Made  With Dr   Anitra Lauth  For today at  [facility]  And  Call if  Symptoms  Worse  Between now  And  Then   Reason for Disposition . [1] MODERATE pain (e.g., interferes with normal activities) AND [2] pain comes and goes (cramps) AND [3] present > 24 hours  (Exception: pain with Vomiting or Diarrhea - see that Guideline)  Answer Assessment - Initial Assessment Questions 1. LOCATION: "Where does it hurt?"      Left lower quadrant      2. RADIATION: "Does the pain shoot anywhere else?" (e.g., chest, back)       no 3. ONSET: "When did the pain begin?" (Minutes, hours or days ago)       3  Days   4. SUDDEN: "Gradual or sudden onset?"       Gradual 5. PATTERN "Does the pain come and go, or is it constant?"    - If constant: "Is it getting better, staying the same, or worsening?"      (Note: Constant means the pain never goes away completely; most serious pain is constant and it progresses)     - If intermittent: "How long does it last?" "Do you have pain now?"     (Note: Intermittent means the pain goes away completely between bouts)       Off  And  On   6. SEVERITY: "How bad is the pain?"  (e.g., Scale 1-10; mild, moderate, or severe)    - MILD (1-3): doesn't interfere with normal activities, abdomen soft and not tender to touch     - MODERATE (4-7): interferes with normal activities or awakens from sleep, tender to touch     - SEVERE (8-10): excruciating pain, doubled over, unable to do any normal activities        Moderate   7. RECURRENT SYMPTOM: "Have you ever had this type of abdominal pain before?" If so, ask: "When was the last time?" and "What happened that time?"        SLIGHTLY  LESS  THAN  3   WEEKS   8. CAUSE: "What do you think is causing  the abdominal pain?"       POSSIBLY  DIVERTICULITIS   9. RELIEVING/AGGRAVATING FACTORS: "What makes it better or worse?" (e.g., movement, antacids, bowel movement)    NONE 10. OTHER SYMPTOMS: "Has there been any vomiting, diarrhea, constipation, or urine problems?"      Sweating    Weakness  Nausea    Slight   Diarrhea     painfull  To touch  Protocols used: ABDOMINAL PAIN - MALE-A-AH

## 2017-06-22 NOTE — Telephone Encounter (Signed)
Converted  To  Triage  Encounter

## 2017-06-22 NOTE — Progress Notes (Signed)
Subjective:  Patient ID: Edwin [name], male    DOB: 16-Apr-1941  Age: 77 y.o. MRN: [id number]  CC: Abdominal Pain   HPI Edwin [name] presents for a several week history of stuttering and intermittent left lower abdominal pain.  He had a CT scan done about 3 weeks ago that showed an area of sigmoid diverticulitis.  He has been treated with augmentin and a cephalosporin antibiotic.  He complains of low-grade fever and chills with night sweats nausea and diarrhea.  He has had a slight loss of appetite but denies vomiting or blood in his stools.  Outpatient Medications Prior to Visit  Medication Sig Dispense Refill  . albuterol (PROVENTIL HFA) 108 (90 BASE) MCG/ACT inhaler Inhale 2 puffs into the lungs every 4 (four) hours as needed for wheezing or shortness of breath. 1-2 puffs every 4 hrs as needed. 1 Inhaler 0  . allopurinol (ZYLOPRIM) 300 MG tablet TAKE 1 TABLET (300 MG TOTAL) BY MOUTH DAILY. 90 tablet 1  . atorvastatin (LIPITOR) 20 MG tablet TAKE 1 TABLET (20 MG TOTAL) BY MOUTH DAILY. 90 tablet 1  . bimatoprost (LUMIGAN) 0.03 % ophthalmic solution Place 1 drop into both eyes at bedtime.      [name] Kitchen BREO ELLIPTA 200-25 MCG/INH AEPB TAKE 1 PUFF BY MOUTH EVERY DAY 60 each 1  . brimonidine (ALPHAGAN P) 0.1 % SOLN 1 drop 2 (two) times daily. Both eyes     . DEXILANT 60 MG capsule TAKE 1 CAPSULE (60 MG TOTAL) BY MOUTH DAILY. 90 capsule 1  . dextromethorphan-guaiFENesin (MUCINEX DM) 30-600 MG 12hr tablet Take 1 tablet by mouth daily as needed for cough.     . EPINEPHrine 0.3 mg/0.3 mL IJ SOAJ injection Inject 0.3 mLs (0.3 mg total) into the muscle once. 2 Device 1  . FLUoxetine (PROZAC) 10 MG capsule TAKE 1 CAPSULE (10 MG TOTAL) BY MOUTH DAILY. 90 capsule 1  . LUMIGAN 0.01 % SOLN     . montelukast (SINGULAIR) 10 MG tablet Take 1 tablet (10 mg total) by mouth at bedtime. 30 tablet 0  . olmesartan-hydrochlorothiazide (BENICAR HCT) 40-12.5 MG tablet Take 0.5 tablets by mouth daily. 90 tablet 1  .  omalizumab (XOLAIR) 150 MG injection Inject 150 mg into the skin every 14 (fourteen) days.    [name] Kitchen psyllium (METAMUCIL) 58.6 % powder Take 1 packet by mouth daily.      . cefdinir (OMNICEF) 300 MG capsule Take 1 capsule (300 mg total) by mouth 2 (two) times daily. 20 capsule 1  . predniSONE (DELTASONE) 10 MG tablet PLEASE SEE ATTACHED FOR DETAILED DIRECTIONS  0  . omalizumab Arvid Right) injection 375 mg      No facility-administered medications prior to visit.     ROS Review of Systems  Constitutional: Positive for chills and fever. Negative for diaphoresis and fatigue.  HENT: Negative.  Negative for facial swelling and trouble swallowing.   Eyes: Negative.  Negative for visual disturbance.  Respiratory: Negative for chest tightness, shortness of breath and wheezing.   Cardiovascular: Negative for chest pain, palpitations and leg swelling.  Gastrointestinal: Positive for abdominal pain, diarrhea and nausea. Negative for blood in stool, constipation, rectal pain and vomiting.  Endocrine: Negative.   Genitourinary: Negative.  Negative for difficulty urinating, dysuria, flank pain, frequency, hematuria, scrotal swelling, testicular pain and urgency.  Musculoskeletal: Negative.   Skin: Negative.   Allergic/Immunologic: Negative.   Neurological: Negative.   Hematological: Negative for adenopathy. Does not bruise/bleed easily.  Psychiatric/Behavioral: Negative.  Objective:  BP 120/80 (BP Location: Left Arm, Patient Position: Sitting, Cuff Size: Normal)   Pulse 78   Temp 98.4 F (36.9 C) (Oral)   Ht 5\' 8"  (1.727 m)   Wt 206 lb 8 oz (93.7 kg)   SpO2 95%   BMI 31.40 kg/m   BP Readings from Last 3 Encounters:  06/22/17 120/80  06/03/17 112/68  05/27/17 130/70    Wt Readings from Last 3 Encounters:  06/22/17 206 lb 8 oz (93.7 kg)  06/03/17 211 lb 4 oz (95.8 kg)  05/27/17 209 lb 12.8 oz (95.2 kg)    Physical Exam  Constitutional: He is oriented to person, place, and time.   Non-toxic appearance. He does not have a sickly appearance. He does not appear ill. No distress.  HENT:  Mouth/Throat: Oropharynx is clear and moist. No oropharyngeal exudate.  Eyes: Conjunctivae are normal. Left eye exhibits no discharge. No scleral icterus.  Neck: Normal range of motion. Neck supple. No JVD present. No thyromegaly present.  Cardiovascular: Normal rate, regular rhythm and normal heart sounds. Exam reveals no gallop.  No murmur heard. Pulmonary/Chest: Effort normal and breath sounds normal. No respiratory distress. He has no wheezes. He has no rales.  Abdominal: Soft. Normal appearance and bowel sounds are normal. He exhibits no distension. There is no hepatosplenomegaly, splenomegaly or hepatomegaly. There is tenderness in the left lower quadrant. There is no rigidity, no rebound, no guarding, no CVA tenderness, no tenderness at McBurney's point and negative Murphy's sign. No hernia. Hernia confirmed negative in the ventral area.  Musculoskeletal: Normal range of motion. He exhibits no edema, tenderness or deformity.  Lymphadenopathy:    He has no cervical adenopathy.  Neurological: He is alert and oriented to person, place, and time.  Skin: Skin is warm and dry. No rash noted. He is not diaphoretic. No erythema. No pallor.  Vitals reviewed.   Lab Results  Component Value Date   WBC 9.1 06/03/2017   HGB 14.7 06/03/2017   HCT 44.1 06/03/2017   PLT 268.0 06/03/2017   GLUCOSE 102 (H) 06/03/2017   CHOL 187 03/03/2016   TRIG 256.0 (H) 03/03/2016   HDL 50.10 03/03/2016   LDLDIRECT 102.0 03/03/2016   LDLCALC 84 10/03/2013   ALT 14 05/27/2017   AST 18 05/27/2017   NA 141 06/03/2017   K 3.7 06/03/2017   CL 101 06/03/2017   CREATININE 1.10 06/03/2017   BUN 16 06/03/2017   CO2 29 06/03/2017   TSH 1.43 03/03/2016   PSA 0.87 05/05/2016   HGBA1C 6.0 06/03/2017    Dg Abd Acute W/chest  Result Date: 06/03/2017 CLINICAL DATA:  Left lower quadrant abdominal pain over the  last 10 days. History of diverticulitis. EXAM: DG ABDOMEN ACUTE W/ 1V CHEST COMPARISON:  CT 05/29/2017 FINDINGS: One-view chest shows normal heart and mediastinal shadows. There is mild pulmonary scarring. No free air seen under the diaphragm. Two view abdominal radiographs show normal bowel gas pattern without evidence of ileus, obstruction or free air. Small lower pole renal stone on the left as seen previously. No acute bone finding. IMPRESSION: Negative abdominal radiographs.  No acute cardiopulmonary disease. Electronically Signed   By: Nelson Chimes M.D.   On: 06/03/2017 10:00    Assessment & Plan:   Edwin [name] was seen today for abdominal pain.  Diagnoses and all orders for this visit:  Pneumonia of left upper lobe due to infectious organism (HCC)-based on his symptoms, exam, and chest x-ray this has resolved.  Diverticulitis large  intestine w/o perforation or abscess w/bleeding- I will treat this with a more aggressive combination of antibiotics including a fluoroquinolone and metronidazole.  He will use promethazine as needed for nausea. -     levofloxacin (LEVAQUIN) 750 MG tablet; Take 1 tablet (750 mg total) by mouth daily for 10 days. -     metroNIDAZOLE (FLAGYL) 500 MG tablet; Take 1 tablet (500 mg total) by mouth 3 (three) times daily for 10 days. -     promethazine (PHENERGAN) 12.5 MG tablet; Take 1 tablet (12.5 mg total) by mouth every 6 (six) hours as needed for nausea or vomiting.   I have discontinued Deaven W. Mahurin's cefdinir and predniSONE. I am also having him start on levofloxacin, metroNIDAZOLE, and promethazine. Additionally, I am having him maintain his bimatoprost, brimonidine, psyllium, albuterol, dextromethorphan-guaiFENesin, EPINEPHrine, omalizumab, montelukast, allopurinol, DEXILANT, atorvastatin, FLUoxetine, LUMIGAN, olmesartan-hydrochlorothiazide, and BREO ELLIPTA. We will stop administering omalizumab.  Meds ordered this encounter  Medications  . levofloxacin  (LEVAQUIN) 750 MG tablet    Sig: Take 1 tablet (750 mg total) by mouth daily for 10 days.    Dispense:  10 tablet    Refill:  0  . metroNIDAZOLE (FLAGYL) 500 MG tablet    Sig: Take 1 tablet (500 mg total) by mouth 3 (three) times daily for 10 days.    Dispense:  30 tablet    Refill:  0  . promethazine (PHENERGAN) 12.5 MG tablet    Sig: Take 1 tablet (12.5 mg total) by mouth every 6 (six) hours as needed for nausea or vomiting.    Dispense:  30 tablet    Refill:  0     Follow-up: Return in about 2 weeks (around 07/06/2017).  Scarlette Calico, MD

## 2017-06-22 NOTE — Telephone Encounter (Signed)
Pt  Rescheduled  To  See  Dr   Ronnald Ramp     Today  At  [place]    And  They  Worked  Him in  With  Dr  Ronnald Ramp    Pt  Notified

## 2017-06-22 NOTE — Telephone Encounter (Signed)
OK, thx.

## 2017-06-22 NOTE — Telephone Encounter (Signed)
FYI

## 2017-06-22 NOTE — Telephone Encounter (Signed)
Copied from [place] [id number]. Topic: Quick Communication - See Telephone Encounter >> Jun 22, 2017  9:01 AM Boyd Kerbs wrote: CRM for notification. See Telephone encounter for:   Patient said he had his Diverticulitis flare up again on Friday and wants to see if Dr. Ronnald Ramp can call in a prescription.   Please call patient if needs to come back in  [facility] [place], [place] [address] Phone: [phone] Fax: [phone]    06/22/17.

## 2017-06-22 NOTE — Patient Instructions (Signed)

## 2017-07-01 ENCOUNTER — Ambulatory Visit: Payer: Self-pay

## 2017-07-01 DIAGNOSIS — J4540 Moderate persistent asthma, uncomplicated: Secondary | ICD-10-CM | POA: Diagnosis not present

## 2017-07-02 ENCOUNTER — Ambulatory Visit (INDEPENDENT_AMBULATORY_CARE_PROVIDER_SITE_OTHER): Payer: Medicare Other

## 2017-07-02 DIAGNOSIS — J4540 Moderate persistent asthma, uncomplicated: Secondary | ICD-10-CM | POA: Diagnosis not present

## 2017-07-02 MED ORDER — OMALIZUMAB 150 MG ~~LOC~~ SOLR
375.0000 mg | SUBCUTANEOUS | Status: DC
  Administered 2017-07-02 – 2019-08-17 (×51): 375 mg via SUBCUTANEOUS

## 2017-07-14 DIAGNOSIS — J4540 Moderate persistent asthma, uncomplicated: Secondary | ICD-10-CM | POA: Diagnosis not present

## 2017-07-15 ENCOUNTER — Ambulatory Visit (INDEPENDENT_AMBULATORY_CARE_PROVIDER_SITE_OTHER): Payer: Medicare Other

## 2017-07-15 DIAGNOSIS — J4540 Moderate persistent asthma, uncomplicated: Secondary | ICD-10-CM

## 2017-07-29 ENCOUNTER — Ambulatory Visit: Payer: Self-pay

## 2017-07-29 DIAGNOSIS — J4540 Moderate persistent asthma, uncomplicated: Secondary | ICD-10-CM

## 2017-07-30 ENCOUNTER — Ambulatory Visit (INDEPENDENT_AMBULATORY_CARE_PROVIDER_SITE_OTHER): Payer: Medicare Other | Admitting: *Deleted

## 2017-07-30 DIAGNOSIS — J4540 Moderate persistent asthma, uncomplicated: Secondary | ICD-10-CM | POA: Diagnosis not present

## 2017-08-10 DIAGNOSIS — J324 Chronic pansinusitis: Secondary | ICD-10-CM | POA: Diagnosis not present

## 2017-08-10 DIAGNOSIS — J849 Interstitial pulmonary disease, unspecified: Secondary | ICD-10-CM | POA: Diagnosis not present

## 2017-08-10 DIAGNOSIS — R0602 Shortness of breath: Secondary | ICD-10-CM | POA: Diagnosis not present

## 2017-08-10 DIAGNOSIS — J4550 Severe persistent asthma, uncomplicated: Secondary | ICD-10-CM | POA: Diagnosis not present

## 2017-08-10 DIAGNOSIS — J441 Chronic obstructive pulmonary disease with (acute) exacerbation: Secondary | ICD-10-CM | POA: Diagnosis not present

## 2017-08-10 DIAGNOSIS — R918 Other nonspecific abnormal finding of lung field: Secondary | ICD-10-CM | POA: Diagnosis not present

## 2017-08-10 DIAGNOSIS — K219 Gastro-esophageal reflux disease without esophagitis: Secondary | ICD-10-CM | POA: Diagnosis not present

## 2017-08-10 DIAGNOSIS — J3089 Other allergic rhinitis: Secondary | ICD-10-CM | POA: Diagnosis not present

## 2017-08-10 DIAGNOSIS — J449 Chronic obstructive pulmonary disease, unspecified: Secondary | ICD-10-CM | POA: Diagnosis not present

## 2017-08-10 DIAGNOSIS — R0609 Other forms of dyspnea: Secondary | ICD-10-CM | POA: Diagnosis not present

## 2017-08-11 ENCOUNTER — Ambulatory Visit (INDEPENDENT_AMBULATORY_CARE_PROVIDER_SITE_OTHER): Payer: Medicare Other | Admitting: Allergy and Immunology

## 2017-08-11 ENCOUNTER — Encounter: Payer: Self-pay | Admitting: Allergy and Immunology

## 2017-08-11 ENCOUNTER — Ambulatory Visit: Payer: Medicare Other

## 2017-08-11 VITALS — BP 130/80 | HR 76 | Resp 16

## 2017-08-11 DIAGNOSIS — R0609 Other forms of dyspnea: Secondary | ICD-10-CM

## 2017-08-11 DIAGNOSIS — K219 Gastro-esophageal reflux disease without esophagitis: Secondary | ICD-10-CM | POA: Diagnosis not present

## 2017-08-11 DIAGNOSIS — J449 Chronic obstructive pulmonary disease, unspecified: Secondary | ICD-10-CM

## 2017-08-11 DIAGNOSIS — J324 Chronic pansinusitis: Secondary | ICD-10-CM

## 2017-08-11 DIAGNOSIS — J441 Chronic obstructive pulmonary disease with (acute) exacerbation: Secondary | ICD-10-CM

## 2017-08-11 DIAGNOSIS — R0602 Shortness of breath: Secondary | ICD-10-CM

## 2017-08-11 DIAGNOSIS — J4550 Severe persistent asthma, uncomplicated: Secondary | ICD-10-CM

## 2017-08-11 DIAGNOSIS — J849 Interstitial pulmonary disease, unspecified: Secondary | ICD-10-CM | POA: Diagnosis not present

## 2017-08-11 DIAGNOSIS — J3089 Other allergic rhinitis: Secondary | ICD-10-CM | POA: Diagnosis not present

## 2017-08-11 DIAGNOSIS — R0600 Dyspnea, unspecified: Secondary | ICD-10-CM

## 2017-08-11 DIAGNOSIS — R918 Other nonspecific abnormal finding of lung field: Secondary | ICD-10-CM | POA: Diagnosis not present

## 2017-08-11 NOTE — Progress Notes (Signed)
Follow-up Note  Referring Provider: Janith Lima, MD Primary Provider: Janith Lima, MD Date of Office Visit: 08/11/2017  Subjective:   Edwin [name] (DOB: 1940/11/16) is a 77 y.o. male who returns to the [facility] on 08/11/2017 in re-evaluation of the following:  HPI: Edwin [name] returns to this clinic in reevaluation of his COPD with component of asthma, allergic rhinitis, history of chronic sinusitis and reflux.  His last visit to this clinic was 19 May 2017.  He informs me that he had an episode of diverticulitis with pneumonia requiring several antibiotics around Christmas time that required about 2-3 weeks for improvement.  Fortunately, that episode is completely resolved.  He does have a colonoscopy scheduled in April with Dr. Earlean Shawl.  Overall he has done very well since his last visit but he still has rather significant dyspnea on exertion.  Walking around the yard and picking up sticks leaves him completely breathless and his recovery time is about 3 minutes or so.  He rarely uses a short acting bronchodilator.  He continues on a combination of anti-inflammatory agents for his respiratory tract including Xolair.  His nose has been doing relatively well.  His throat and reflux have been doing very well.  He does not consume any caffeine and rarely has chocolate and occasionally has vodka.  He continues on a proton pump inhibitor and a nasal steroid.  Allergies as of 08/11/2017      Reactions   Ramipril    REACTION: chest congestion & cough. No angioedema      Medication List      albuterol 108 (90 Base) MCG/ACT inhaler Commonly known as:  PROVENTIL HFA Inhale 2 puffs into the lungs every 4 (four) hours as needed for wheezing or shortness of breath. 1-2 puffs every 4 hrs as needed.   allopurinol 300 MG tablet Commonly known as:  ZYLOPRIM TAKE 1 TABLET (300 MG TOTAL) BY MOUTH DAILY.   atorvastatin 20 MG tablet Commonly known as:  LIPITOR TAKE 1  TABLET (20 MG TOTAL) BY MOUTH DAILY.   bimatoprost 0.03 % ophthalmic solution Commonly known as:  LUMIGAN Place 1 drop into both eyes at bedtime.   LUMIGAN 0.01 % Soln Generic drug:  bimatoprost   BREO ELLIPTA 200-25 MCG/INH Aepb Generic drug:  fluticasone furoate-vilanterol TAKE 1 PUFF BY MOUTH EVERY DAY   brimonidine 0.1 % Soln Commonly known as:  ALPHAGAN P 1 drop 2 (two) times daily. Both eyes   DEXILANT 60 MG capsule Generic drug:  dexlansoprazole TAKE 1 CAPSULE (60 MG TOTAL) BY MOUTH DAILY.   dextromethorphan-guaiFENesin 30-600 MG 12hr tablet Commonly known as:  MUCINEX DM Take 1 tablet by mouth daily as needed for cough.   EPINEPHrine 0.3 mg/0.3 mL Soaj injection Commonly known as:  EPI-PEN Inject 0.3 mLs (0.3 mg total) into the muscle once.   FLUoxetine 10 MG capsule Commonly known as:  PROZAC TAKE 1 CAPSULE (10 MG TOTAL) BY MOUTH DAILY.   montelukast 10 MG tablet Commonly known as:  SINGULAIR Take 1 tablet (10 mg total) by mouth at bedtime.   olmesartan-hydrochlorothiazide 40-12.5 MG tablet Commonly known as:  BENICAR HCT Take 0.5 tablets by mouth daily.   promethazine 12.5 MG tablet Commonly known as:  PHENERGAN Take 1 tablet (12.5 mg total) by mouth every 6 (six) hours as needed for nausea or vomiting.   psyllium 58.6 % powder Commonly known as:  METAMUCIL Take 1 packet by mouth daily.  Past Medical History:  Diagnosis Date  . Asthma   . COPD (chronic obstructive pulmonary disease) (HCC)    reactive airway disease  . Glaucoma   . Gout   . Hx of colonic polyps    Dr Earlean Shawl  . Hyperlipidemia    NMR lipoprofile 2005: LDL 113(1416/825), HLD 37, TG 190.  [name] Kitchen Hypertension   . Pneumonia     Past Surgical History:  Procedure Laterality Date  . CATARACT EXTRACTION     bilat, implants  . CHOLECYSTECTOMY  1995  . LUMBAR LAMINECTOMY  1982  . POLYPECTOMY  2003    neg 2006, Dr.Medoff  . tear duct surgery  1998   for excess tearing, skin  graft L foot @ age 55 1/2    Review of systems negative except as noted in HPI / PMHx or noted below:  Review of Systems  Constitutional: Negative.   HENT: Negative.   Eyes: Negative.   Respiratory: Negative.   Cardiovascular: Negative.   Gastrointestinal: Negative.   Genitourinary: Negative.   Musculoskeletal: Negative.   Skin: Negative.   Neurological: Negative.   Endo/Heme/Allergies: Negative.   Psychiatric/Behavioral: Negative.      Objective:   Vitals:   08/11/17 1029  BP: 130/80  Pulse: 76  Resp: 16          Physical Exam  Constitutional: He is well-developed, well-nourished, and in no distress.  HENT:  Head: Normocephalic.  Right Ear: Tympanic membrane, external ear and ear canal normal.  Left Ear: Tympanic membrane, external ear and ear canal normal.  Nose: Nose normal. No mucosal edema or rhinorrhea.  Mouth/Throat: Uvula is midline, oropharynx is clear and moist and mucous membranes are normal. No oropharyngeal exudate.  Eyes: Conjunctivae are normal.  Neck: Trachea normal. No tracheal tenderness present. No tracheal deviation present. No thyromegaly present.  Cardiovascular: Normal rate, regular rhythm, S1 normal, S2 normal and normal heart sounds.  No murmur heard. Pulmonary/Chest: No stridor. No respiratory distress. He has wheezes (Inspiratory crackles bibasilar posterior lung Butzer). He has no rales.  Musculoskeletal: He exhibits no edema.  Lymphadenopathy:       Head (right side): No tonsillar adenopathy present.       Head (left side): No tonsillar adenopathy present.    He has no cervical adenopathy.  Neurological: He is alert. Gait normal.  Skin: No rash noted. He is not diaphoretic. No erythema. Nails show no clubbing.  Psychiatric: Mood and affect normal.    Diagnostics:    Spirometry was performed and demonstrated an FEV1 of 2.04 at 73 % of predicted.  The patient had an Asthma Control Test with the following results: ACT Total Score:  16.    Oxygen saturation at rest on room air was 95%.  With walking up and down the hallway until he became short of breath his oxygen saturation on room air remained at 95%.  Results of a abdominal CT scan obtained 29 May 2017 identified the following:  Lower chest: Improved lung volumes compared to 2017. Patchy opacity at both lung bases appears to be chronic to a degree, greater on the left. Patchy peribronchial left lower lobe involvement though resembles bronchopneumonia on series 3, image 12. Some of the changes most resemble chronic scarring, including along the right minor fissure. No pericardial or pleural effusion. Calcified coronary artery atherosclerosis.  Assessment and Plan:   1. COPD with asthma ([place])   2. Other allergic rhinitis   3. Chronic pansinusitis   4. LPRD (laryngopharyngeal reflux disease)  5. Dyspnea on exertion   6. Pulmonary infiltrates   7. Chronic obstructive pulmonary disease with acute exacerbation ([place])   8. Shortness of breath   9. Interstitial pulmonary disease ([place])     1. Continue to Treat inflammation:   A. Continue Breo 200 one inhalation one time per day  B. OTC Nasacort one spray each nostril one time per day  C. Xolair and EpiPen    2. Continue to Treat reflux:   A. Dexilant 60 mg in the a.m.  B. Continue off all caffeine and chocolate  3. If needed:   A. ProAir HFA 2 puffs every 4-6 hours  B. nasal saline washes  C. OTC antihistamine  D. Mucinex DM 2 tablets twice a day  E. Duoneb nebulized every 4-6 hours if needed.  4. Return to clinic in Summer 2019 or earlier if problem  5. Engage in consistent exercise program  6. Obtain a high resolution chest CT scan  Edwin [name] appears to be doing relatively well but his dyspnea on exertion is quite significant and although this may be a reflection of cardiac deconditioning as well as his inflammatory lung condition I think we need to further evaluate this issue especially given  the finding of inspiratory crackles across his chest.  His previous CT scan of 2017 has identified infiltrates as has most of his imaging studies and he may have some other issue ongoing besides for standard inflammatory disease as a result of COPD and asthma.  His reflux induced respiratory disease and nasal issue appears to be going quite well.  I will contact him with the results of his CT scan once it is available for review.  Allena Katz, MD [facility] [place]

## 2017-08-11 NOTE — Patient Instructions (Addendum)
  1. Continue to Treat inflammation:   A. Continue Breo 200 one inhalation one time per day  B. OTC Nasacort one spray each nostril one time per day  C. Xolair and EpiPen    2. Continue to Treat reflux:   A. Dexilant 60 mg in the a.m.  B. Continue off all caffeine and chocolate  3. If needed:   A. ProAir HFA 2 puffs every 4-6 hours  B. nasal saline washes  C. OTC antihistamine  D. Mucinex DM 2 tablets twice a day  E. Duoneb nebulized every 4-6 hours if needed.  4. Return to clinic in Summer 2019 or earlier if problem  5. Engage in consistent exercise program  6. Obtain a high resolution chest CT scan

## 2017-08-12 ENCOUNTER — Encounter: Payer: Self-pay | Admitting: Allergy and Immunology

## 2017-08-20 ENCOUNTER — Other Ambulatory Visit: Payer: Self-pay | Admitting: Allergy and Immunology

## 2017-08-21 ENCOUNTER — Other Ambulatory Visit: Payer: Self-pay | Admitting: Internal Medicine

## 2017-08-21 DIAGNOSIS — J4540 Moderate persistent asthma, uncomplicated: Secondary | ICD-10-CM | POA: Diagnosis not present

## 2017-08-24 ENCOUNTER — Ambulatory Visit
Admission: RE | Admit: 2017-08-24 | Discharge: 2017-08-24 | Disposition: A | Discharge status: Home / Self Care | Payer: Medicare Other | Source: Ambulatory Visit | Attending: Allergy and Immunology | Admitting: Allergy and Immunology

## 2017-08-24 ENCOUNTER — Ambulatory Visit (INDEPENDENT_AMBULATORY_CARE_PROVIDER_SITE_OTHER): Payer: Medicare Other | Admitting: *Deleted

## 2017-08-24 DIAGNOSIS — J441 Chronic obstructive pulmonary disease with (acute) exacerbation: Secondary | ICD-10-CM

## 2017-08-24 DIAGNOSIS — J4540 Moderate persistent asthma, uncomplicated: Secondary | ICD-10-CM | POA: Diagnosis not present

## 2017-08-24 DIAGNOSIS — R0602 Shortness of breath: Secondary | ICD-10-CM

## 2017-08-24 DIAGNOSIS — J849 Interstitial pulmonary disease, unspecified: Secondary | ICD-10-CM

## 2017-08-25 ENCOUNTER — Ambulatory Visit: Payer: Medicare Other

## 2017-08-26 ENCOUNTER — Encounter: Payer: Self-pay | Admitting: Internal Medicine

## 2017-08-26 ENCOUNTER — Telehealth: Payer: Self-pay | Admitting: *Deleted

## 2017-08-26 DIAGNOSIS — J849 Interstitial pulmonary disease, unspecified: Secondary | ICD-10-CM

## 2017-08-26 DIAGNOSIS — J8489 Other specified interstitial pulmonary diseases: Secondary | ICD-10-CM | POA: Insufficient documentation

## 2017-08-26 NOTE — Telephone Encounter (Signed)
-----   Message from Jiles Prows, MD sent at 08/26/2017  6:36 AM EDT ----- Please arrange blood for HSP profile, ANA w/reflex, CCP, RA, CRP, SED, and order full PFT with lung volumes and dlco. Please inform patient that I discussed his CT scan with Dr. Melvyn Novas and he may have an issue with some other process than asthma / COPD, and please make him an appointment to visit with Dr. Melvyn Novas after his tests are ordered and completed.

## 2017-08-26 NOTE — Telephone Encounter (Signed)
Informed patient of CT scan results. Informed him of blood test, full PFT and referral to pulmonology Dr. Melvyn Novas. Patient will come back office within the next week or so for blood test.

## 2017-08-31 ENCOUNTER — Telehealth: Payer: Self-pay | Admitting: Allergy and Immunology

## 2017-08-31 NOTE — Telephone Encounter (Signed)
Referral was faxed to Dr Melvyn Novas office They will contact patient for an appt

## 2017-08-31 NOTE — Telephone Encounter (Signed)
-----   Message from Horris Latino, [place] sent at 08/26/2017  1:49 PM EDT ----- Regarding: referral Please refer patient to Dr. Melvyn Novas for interstitial lung disease, abnormal CT. Dr. Neldon Mc has already spoke to him relating the patient. Thank you!!!

## 2017-09-07 DIAGNOSIS — Z1211 Encounter for screening for malignant neoplasm of colon: Secondary | ICD-10-CM | POA: Diagnosis not present

## 2017-09-07 DIAGNOSIS — D123 Benign neoplasm of transverse colon: Secondary | ICD-10-CM | POA: Diagnosis not present

## 2017-09-07 DIAGNOSIS — K6289 Other specified diseases of anus and rectum: Secondary | ICD-10-CM | POA: Diagnosis not present

## 2017-09-07 DIAGNOSIS — K648 Other hemorrhoids: Secondary | ICD-10-CM | POA: Diagnosis not present

## 2017-09-07 DIAGNOSIS — K635 Polyp of colon: Secondary | ICD-10-CM | POA: Diagnosis not present

## 2017-09-07 DIAGNOSIS — D125 Benign neoplasm of sigmoid colon: Secondary | ICD-10-CM | POA: Diagnosis not present

## 2017-09-07 DIAGNOSIS — D124 Benign neoplasm of descending colon: Secondary | ICD-10-CM | POA: Diagnosis not present

## 2017-09-07 DIAGNOSIS — K5730 Diverticulosis of large intestine without perforation or abscess without bleeding: Secondary | ICD-10-CM | POA: Diagnosis not present

## 2017-09-07 DIAGNOSIS — Z86010 Personal history of colonic polyps: Secondary | ICD-10-CM | POA: Diagnosis not present

## 2017-09-07 DIAGNOSIS — D122 Benign neoplasm of ascending colon: Secondary | ICD-10-CM | POA: Diagnosis not present

## 2017-09-07 LAB — HM COLONOSCOPY

## 2017-09-08 DIAGNOSIS — D124 Benign neoplasm of descending colon: Secondary | ICD-10-CM | POA: Diagnosis not present

## 2017-09-08 DIAGNOSIS — D123 Benign neoplasm of transverse colon: Secondary | ICD-10-CM | POA: Diagnosis not present

## 2017-09-08 DIAGNOSIS — K635 Polyp of colon: Secondary | ICD-10-CM | POA: Diagnosis not present

## 2017-09-08 DIAGNOSIS — D125 Benign neoplasm of sigmoid colon: Secondary | ICD-10-CM | POA: Diagnosis not present

## 2017-09-08 DIAGNOSIS — D126 Benign neoplasm of colon, unspecified: Secondary | ICD-10-CM | POA: Diagnosis not present

## 2017-09-08 DIAGNOSIS — J4540 Moderate persistent asthma, uncomplicated: Secondary | ICD-10-CM | POA: Diagnosis not present

## 2017-09-09 ENCOUNTER — Ambulatory Visit (HOSPITAL_COMMUNITY)
Admission: RE | Admit: 2017-09-09 | Discharge: 2017-09-09 | Disposition: A | Discharge status: Home / Self Care | Payer: Medicare Other | Source: Ambulatory Visit | Attending: Allergy and Immunology | Admitting: Allergy and Immunology

## 2017-09-09 ENCOUNTER — Ambulatory Visit (INDEPENDENT_AMBULATORY_CARE_PROVIDER_SITE_OTHER): Payer: Medicare Other | Admitting: *Deleted

## 2017-09-09 DIAGNOSIS — J849 Interstitial pulmonary disease, unspecified: Secondary | ICD-10-CM

## 2017-09-09 DIAGNOSIS — J984 Other disorders of lung: Secondary | ICD-10-CM | POA: Insufficient documentation

## 2017-09-09 DIAGNOSIS — J4540 Moderate persistent asthma, uncomplicated: Secondary | ICD-10-CM | POA: Diagnosis not present

## 2017-09-09 DIAGNOSIS — R942 Abnormal results of pulmonary function studies: Secondary | ICD-10-CM | POA: Diagnosis not present

## 2017-09-09 DIAGNOSIS — J449 Chronic obstructive pulmonary disease, unspecified: Secondary | ICD-10-CM | POA: Diagnosis not present

## 2017-09-09 LAB — PULMONARY FUNCTION TEST
DL/VA % pred: 82 %
DL/VA: 3.79 ml/min/mmHg/L
DLCO UNC: 18.65 ml/min/mmHg
DLCO unc % pred: 56 %
FEF 25-75 Pre: 1.59 L/sec
FEF2575-%PRED-PRE: 71 %
FEV1-%Pred-Pre: 69 %
FEV1-Pre: 2.14 L
FEV1FVC-%PRED-PRE: 103 %
FEV6-%Pred-Pre: 70 %
FEV6-Pre: 2.83 L
FEV6FVC-%Pred-Pre: 104 %
FVC-%PRED-PRE: 67 %
FVC-PRE: 2.88 L
Pre FEV1/FVC ratio: 74 %
Pre FEV6/FVC Ratio: 98 %
RV % pred: 84 %
RV: 2.19 L
TLC % PRED: 71 %
TLC: 5.14 L

## 2017-09-14 LAB — HYPERSENSITIVITY PNEUMONITIS
A. FUMIGATUS #1 ABS: NEGATIVE
A. Pullulans Abs: NEGATIVE
MICROPOLYSPORA FAENI IGG: NEGATIVE
Pigeon Serum Abs: NEGATIVE
THERMOACT. SACCHARII: NEGATIVE
THERMOACTINOMYCES VULGARIS IGG: NEGATIVE

## 2017-09-14 LAB — RA QN+CCP(IGG/A)+SJOSSA+SJOSSB
CYCLIC CITRULLIN PEPTIDE AB: 6 U (ref 0–19)
ENA SSA (RO) Ab: <0.2 AI (ref 0.0–0.9)
ENA SSB (LA) Ab: <0.2 AI (ref 0.0–0.9)

## 2017-09-14 LAB — ANA W/REFLEX IF POSITIVE: Anti Nuclear Antibody(ANA): NEGATIVE

## 2017-09-14 LAB — C-REACTIVE PROTEIN: CRP: 0.6 mg/L (ref 0.0–4.9)

## 2017-09-14 LAB — SEDIMENTATION RATE: Sed Rate: 3 mm/hr (ref 0–30)

## 2017-09-15 ENCOUNTER — Encounter: Payer: Self-pay | Admitting: Internal Medicine

## 2017-09-20 ENCOUNTER — Other Ambulatory Visit: Payer: Self-pay | Admitting: Internal Medicine

## 2017-09-22 DIAGNOSIS — J4540 Moderate persistent asthma, uncomplicated: Secondary | ICD-10-CM

## 2017-09-23 ENCOUNTER — Ambulatory Visit (INDEPENDENT_AMBULATORY_CARE_PROVIDER_SITE_OTHER): Payer: Medicare Other | Admitting: *Deleted

## 2017-09-23 ENCOUNTER — Encounter: Payer: Self-pay | Admitting: Internal Medicine

## 2017-09-23 ENCOUNTER — Ambulatory Visit (INDEPENDENT_AMBULATORY_CARE_PROVIDER_SITE_OTHER): Payer: Medicare Other | Admitting: Internal Medicine

## 2017-09-23 VITALS — BP 126/70 | HR 70 | Ht 70.0 in | Wt 210.0 lb

## 2017-09-23 DIAGNOSIS — J8489 Other specified interstitial pulmonary diseases: Secondary | ICD-10-CM | POA: Diagnosis not present

## 2017-09-23 DIAGNOSIS — J4540 Moderate persistent asthma, uncomplicated: Secondary | ICD-10-CM | POA: Diagnosis not present

## 2017-09-23 DIAGNOSIS — J449 Chronic obstructive pulmonary disease, unspecified: Secondary | ICD-10-CM | POA: Diagnosis not present

## 2017-09-23 LAB — NITRIC OXIDE: NITRIC OXIDE: 10

## 2017-09-23 MED ORDER — BUDESONIDE-FORMOTEROL FUMARATE 80-4.5 MCG/ACT IN AERO: 2.0000 | INHALATION_SPRAY | Freq: Two times a day (BID) | RESPIRATORY_TRACT | 11 refills | Status: DC

## 2017-09-23 MED ORDER — BUDESONIDE-FORMOTEROL FUMARATE 160-4.5 MCG/ACT IN AERO: 2.0000 | INHALATION_SPRAY | Freq: Two times a day (BID) | RESPIRATORY_TRACT | 0 refills | Status: DC

## 2017-09-23 MED ORDER — FAMOTIDINE 20 MG PO TABS: ORAL_TABLET | ORAL | 11 refills | Status: DC

## 2017-09-23 MED ORDER — PANTOPRAZOLE SODIUM 40 MG PO TBEC: 40.0000 mg | DELAYED_RELEASE_TABLET | Freq: Every day | ORAL | 2 refills | Status: DC

## 2017-09-23 MED ORDER — PREDNISONE 10 MG PO TABS: ORAL_TABLET | ORAL | 0 refills | Status: DC

## 2017-09-23 NOTE — Patient Instructions (Addendum)
Stop breo    symbicort 160 Take 2 puffs first thing in am and then another 2 puffs about 12 hours later until you use it up then start the symbicort 80   Work on inhaler technique:  relax and gently blow all the way out then take a nice smooth deep breath back in, triggering the inhaler at same time you start breathing in.  Hold for up to 5 seconds if you can. Blow out thru nose. Rinse and gargle with water when done      Only use your albuterol as a rescue medication to be used if you can't catch your breath by resting or doing a relaxed purse lip breathing pattern.  - The less you use it, the better it will work when you need it. - Ok to use up to 2 puffs  every 4 hours if you must but call for immediate appointment if use goes up over your usual need - Don't leave home without it !!  (think of it like the spare tire for your car)   If doing worse at any time > Prednisone 10 mg take  4 each am x 2 days,   2 each am x 2 days,  1 each am x 2 days and stop    Protonix 40 mg   30-60 min before first meal of the day and Pepcid (famotidine)  20 mg one @  bedtime until return to office - this is the best way to tell whether stomach acid is contributing to your problem.     GERD (REFLUX)  is an extremely common cause of respiratory symptoms just like yours , many times with no obvious heartburn at all.    It can be treated with medication, but also with lifestyle changes including elevation of the head of your bed (ideally with 6 inch  bed blocks),  Smoking cessation, avoidance of late meals, excessive alcohol, and avoid fatty foods, chocolate, peppermint, colas, red wine, and acidic juices such as orange juice.  NO MINT OR MENTHOL PRODUCTS SO NO COUGH DROPS   USE SUGARLESS CANDY INSTEAD (Jolley ranchers or Stover's or Life Savers) or even ice chips will also do - the key is to swallow to prevent all throat clearing. NO OIL BASED VITAMINS - use powdered substitutes.     Please schedule a  follow up office visit in 4 weeks, sooner if needed  with all medications /inhalers/ solutions in hand so we can verify exactly what you are taking. This includes all medications from all doctors and over the counters

## 2017-09-23 NOTE — Assessment & Plan Note (Addendum)
PFTs 01/08/11 with min airflow obstruction ONO 8/12: normal , no desaturation 03/2011 CT chest neg bronchiectasis CT sinus: mild maxillary sinusitis  - 04/17/2015  Walked RA x 3 laps @ 185 ft each stopped due to End of study, brisk  pace, no   desat  Mild sob - spirometry 04/17/2015  No signficant airflow obst    - PFT's 09/09/17   FEV1 2.14 (69 % ) ratio 74  p brei 200 prior to study with DLCO  56 % corrects to 82 % for alv volume   - Spirometry 09/23/2017  FEV1 2.07 (68%)  Ratio 70 p breo 200 but actively coughing - FENO 09/23/2017   =   10 on breo 200   - 09/23/2017  After extensive coaching inhaler device  effectiveness =    90% try symbicort 160  X 2 week sample then step down to 80 2bid    DDX of  difficult airways management almost all start with A and  include Adherence, Ace Inhibitors, Acid Reflux, Active Sinus Disease, Alpha 1 Antitripsin deficiency, Anxiety masquerading as Airways dz,  ABPA,  Allergy(esp in young), Aspiration (esp in elderly), Adverse effects of meds,  Active smokers, A bunch of PE's (a small clot burden can't cause this syndrome unless there is already severe underlying pulm or vascular dz with poor reserve) plus two Bs  = Bronchiectasis and Beta blocker use..and one C= CHF   Adherence is always the initial "prime suspect" and is a multilayered concern that requires a "trust but verify" approach in every patient - starting with knowing how to use medications, especially inhalers, correctly, keeping up with refills and understanding the fundamental difference between maintenance and prns vs those medications only taken for a very short course and then stopped and not refilled.  - see hfa teaching  - return with all meds in hand using a trust but verify approach to confirm accurate Medication  Reconciliation The principal here is that until we are certain that the  patients are doing what we've asked, it makes no sense to ask them to do more.    ? Acid (or non-acid) GERD  > always difficult to exclude as up to 75% of pts in some series report no assoc GI/ Heartburn symptoms> rec max (24h)  acid suppression and diet restrictions/ reviewed and instructions given in writing.    ? Allergy > continue xolair/ singulair/ try reduce  ICS to symb 160 then 80 2bid if tol and if not take prednisone x 6 days   ? Adverse effects of meds , esp DPI > try off Breo for now    F/u in 4 weeks to regroup

## 2017-09-23 NOTE — Progress Notes (Signed)
Subjective:    Patient ID: Edwin [name], male    DOB: 1940-07-06    MRN: [id number]    Brief patient profile:  34 yowm quit smoking in 1985  Former pt of Dr Joya Gaskins with AB/ rhinitis    History of Present Illness  10/23/2014  Ov/ Dr Star Age  Chief Complaint  Patient presents with  . Follow-up    Pt stated productive cough with thick gren mucus x 4 days. Rx for prednisone and avelox were called in on 5/19. Pt states he started feeling better yesterday and that he is still coughing but does not have as much mucus production   Pt did well overall then on his own developed: thick green mucus x 4 days.  We Called in avelox and pred last week 4 days ago, then better over past few days, also nasonex started.  Notes some mucus out of the nose, on the netipot.  rec Finish prednisone and avelox Stay on inhalers Work on Left nostril for neti pot     02/15/2015 acute ov/Edwin [name] re: ?aecopd maint rx with spiriva/ symbicort - 95% better last ov / 5% "never better" x 5 years = daytime sense of pnds >>then worse first of sept 2016   Chief Complaint  Patient presents with  . Acute Visit    pt. of PW-Levaquin x 5days last wk.,last pill 4 days ago.C/o cough-still yellow, thick. Sob with exertion,wheezing when lays down,denies cp or tightness. No fcs.Has PND.  acute onset persisttent cough  wheeze and sob transiently some better p levaquin 500 x 5 days with prominent nasal congestion also Better transiently also with saba, comfortable at rest  rec Augmentin 875 mg take one pill twice daily  X 10 days -   Prednisone 10 mg take  4 each am x 2 days,   2 each am x 2 days,  1 each am x 2 days and stop  Work on inhaler technique:  If not all better call [facility] [phone] for sinus CT > did not do  Add pepcid 20 mg at bedtime for any flare of cough or wheeze   > did not do  GERD diet      03/06/2015  f/u ov/Edwin [name] re: copd/ refractory cough x 5 y with sensation of persistent daytime pnds but no excess mucus on  spirva dpi/ symb 160 rare saba  Chief Complaint  Patient presents with  . Follow-up    Pt states he is coughing less, but still producing some yellow sputum. His breathing is unchanged.   rec Stop losartan and start benicar 40-12.5 one half daily  Stop spiriva handihaler and start respimat 2 puffs every am  Stay on pepcid 20 mg at bedtime automatically for now  Please see patient coordinator before you leave today  to schedule sinus CT > neg  For cough > mucinex dm 1200 mg every 12 hours as needed and flutter valve  Work on inhaler technique:  relax and gently blow all the way out then take a nice smooth deep breath back in, triggering the inhaler at same time you start breathing in.  Hold for up to 5 seconds if you can. Blow out thru nose. Rinse and gargle with water when done Please schedule a follow up office visit in 6 weeks, call sooner if needed - bring all active meds in separate bags   Bag 1 automatic/ Bag 2 up to you       04/17/2015  f/u ov/Edwin [name] re: never  100% better x 5 years with AB/ sinus dz brought all meds as rec on max symb/ spiriva Chief Complaint  Patient presents with  . Follow-up    Pt states his breathing is unchanged. No new co's today.    Doe x MMRC1 = can walk nl pace, flat grade, can't hurry or go uphills or steps s sob   rec Dulera 100 Take 2 puffs first thing in am and then another 2 puffs about 12 hours later and try off symbiort /spiriva  Only use your albuterol  Ok to use ventolin  Before you do an activity that you know makes you short of breath Work on inhaler technique:   05/01/15 phone call. Green mucus > rx augmentin x 10 d  05/08/2015  f/u ov/Edwin [name] re: AB/ chronic rhinitis on singulair/ dulera 100 2bid - best he's felt in years  Chief Complaint  Patient presents with  . Follow-up    Pt states that his cough is much improved. No new co's today. He has not needed albuterol.   No noct symptoms at all / constant urge to clear throat during the day but  the deep prod cough is better on augmentin  rec Augmentin 875 mg take one pill twice daily  X 10 days - take at breakfast and supper with large glass of water.  It would help reduce the usual side effects (diarrhea and yeast infections) if you ate cultured yogurt at lunch.  Stay on dulera 100 Take 2 puffs first thing in am and then another 2 puffs about 12 hours later.  Keep appt with Dr Neldon Mc to discuss elevated IgE on your tests in [facility]    09/23/2017  f/u ov/Edwin [name] re:   ? NSIP  Chief Complaint  Patient presents with  . Follow-up    SOB but nothing out of normal, productive cough clear mucus, No tightness in the chest,taking breo only not singulair and has rescue inhaler if needed hasn't used in a while, Dr. Carmelina Peal suggested come see pulmonary post CT scan  Dyspnea changed spring 2017 : MMRC3 = can't walk 100 yards even at a slow pace at a flat grade s stopping due to sob    Cough: "all the time" s pattern x less when asleep Sleep: min am congestion /mucoid only  SABA use:  Not much  At all/ prednisone helped all his symptoms quite a bit    No obvious day to day or daytime variability or assoc excess/ purulent sputum or mucus plugs or hemoptysis or cp or chest tightness, subjective wheeze or overt sinus or hb symptoms. No unusual exposure hx or h/o childhood pna/ asthma or knowledge of premature birth.  Sleeping ok flat without nocturnal  or early am exacerbation  of respiratory  c/o's or need for noct saba. Also denies any obvious fluctuation of symptoms with weather or environmental changes or other aggravating or alleviating factors except as outlined above   Current Allergies, Complete Past Medical History, Past Surgical History, Family History, and Social History were reviewed in [employer] record.  ROS  The following are not active complaints unless bolded Hoarseness, sore throat, dysphagia, dental problems, itching, sneezing,  nasal congestion or discharge of  excess mucus or purulent secretions, ear ache,   fever, chills, sweats, unintended wt loss or wt gain, classically pleuritic or exertional cp,  orthopnea pnd or leg swelling, presyncope, palpitations, abdominal pain, anorexia, nausea, vomiting, diarrhea  or change in bowel habits or change in bladder  habits, change in stools or change in urine, dysuria, hematuria,  rash, arthralgias hands, visual complaints, headache, numbness, weakness or ataxia or problems with walking or coordination,  change in mood/affect or memory.        Current Meds  Medication Sig  . albuterol (PROVENTIL HFA) 108 (90 BASE) MCG/ACT inhaler Inhale 2 puffs into the lungs every 4 (four) hours as needed for wheezing or shortness of breath. 1-2 puffs every 4 hrs as needed.  [name] Kitchen allopurinol (ZYLOPRIM) 300 MG tablet TAKE 1 TABLET (300 MG TOTAL) BY MOUTH DAILY.  [name] Kitchen atorvastatin (LIPITOR) 20 MG tablet TAKE 1 TABLET (20 MG TOTAL) BY MOUTH DAILY.  . bimatoprost (LUMIGAN) 0.03 % ophthalmic solution Place 1 drop into both eyes at bedtime.    [name] Kitchen BREO ELLIPTA 200-25 MCG/INH AEPB TAKE 1 PUFF BY MOUTH EVERY DAY  . brimonidine (ALPHAGAN P) 0.1 % SOLN 1 drop 2 (two) times daily. Both eyes   . DEXILANT 60 MG capsule TAKE 1 CAPSULE (60 MG TOTAL) BY MOUTH DAILY.  [name] Kitchen EPINEPHrine 0.3 mg/0.3 mL IJ SOAJ injection Inject 0.3 mLs (0.3 mg total) into the muscle once.  [name] Kitchen FLUoxetine (PROZAC) 10 MG capsule TAKE 1 CAPSULE (10 MG TOTAL) BY MOUTH DAILY.  [name] Kitchen LUMIGAN 0.01 % SOLN   . olmesartan-hydrochlorothiazide (BENICAR HCT) 40-12.5 MG tablet Take 0.5 tablets by mouth daily.  . psyllium (METAMUCIL) 58.6 % powder Take 1 packet by mouth daily.     Current Facility-Administered Medications for the 09/23/17 encounter (Office Visit) with Tanda Rockers, MD  Medication  . omalizumab Arvid Right) injection 375 mg                     Objective:    amb somewhat hoarse wm nad    03/06/2015         207 >  04/17/2015  207 > 05/08/2015  209  > 09/23/2017   210      02/15/15 209 lb (94.802 kg)  01/08/15 210 lb (95.255 kg)  12/26/14 209 lb 4 oz (94.915 kg)     Vital signs reviewed - Note on arrival 02 sats  94% on RA      HEENT: nl dentition, turbinates bilaterally, and oropharynx. Nl external ear canals without cough reflex   NECK :  without JVD/Nodes/TM/ nl carotid upstrokes bilaterally   LUNGS: no acc muscle use,  Nl contour chest with insp / exp rhonchi / pseudo wheezing  without cough on insp or exp maneuvers   CV:  RRR  no s3 or murmur or increase in P2, and no edema   ABD:  soft and nontender with nl inspiratory excursion in the supine position. No bruits or organomegaly appreciated, bowel sounds nl  MS:  Nl gait/ ext warm without deformities, calf tenderness, cyanosis or clubbing No obvious joint restrictions   SKIN: warm and dry without lesions    NEURO:  alert, approp, nl sensorium with  no motor or cerebellar deficits apparent.                  I personally reviewed images and agree with radiology impression as follows:   Chest CT 08/24/17 1. The appearance of the lungs is suggestive of interstitial lung disease. CT pattern for UIP is considered indeterminate (usual interstitial pneumonia), and at this time is favored to reflect nonspecific interstitial pneumonia (NSIP). Repeat high-resolution chest CT is recommended in 12 months to assess for temporal changes in the appearance of the lung parenchyma. 2. At  time of follow-up high-resolution chest CT, attention should be directed to the ground-glass attenuation nodule in the left upper lobe (axial image 60 of series 7) discussed above.          Assessment & Plan:

## 2017-09-23 NOTE — Assessment & Plan Note (Addendum)
See HRCT  08/24/17 c/w nsip vs boop  - ESR 09/09/17  =  3  - Collagen vasc profile 09/09/17 >>> neg - HSP profile 09/09/17> neg  - 09/23/2017  Walked RA x 3 laps @ 185 ft each stopped due to  End of study, nl to fast  pace, no   Desat but sob at end      ddx for diffuse infiltrative lung dz  Miscellaneous:Alv microlithiasis, alv proteinosis, asp, bronchiectais (which we know he has and places him at risk of resistant orgs/MAI) > consider FOB  BOOP  >  Esr nl makes less likely   ARDS/ AIP Occupational dz/ HSP>   serology  Neg and  Neoplasm Infection> esp MAI > needs fob to sort out  Drug  Pulmonary emboli, Protein disorders Edema/Eosinophilic dz >  eos  0.2  02/4764  Sarcoidosis Connective tissue dz>   profile neg  Hist X / Hemorrhage Idiopathic    I had an extended discussion with the patient reviewing all relevant studies completed to date and  lasting 25 minutes of a 40  minute re-establish office visit for an new dx assoc with  non-specific but potentially very serious refractory respiratory symptoms of uncertain and potentially multiple  etiologies.   Each maintenance medication was reviewed in detail including most importantly the difference between maintenance and prns and under what circumstances the prns are to be triggered using an action plan format that is not reflected in the computer generated alphabetically organized AVS.    Please see AVS for specific instructions unique to this office visit that I personally wrote and verbalized to the the pt in detail and then reviewed with pt  by my nurse highlighting any changes in therapy/plan of care  recommended at today's visit.

## 2017-09-24 ENCOUNTER — Encounter: Payer: Self-pay | Admitting: Internal Medicine

## 2017-09-25 ENCOUNTER — Other Ambulatory Visit: Payer: Self-pay | Admitting: Internal Medicine

## 2017-10-06 DIAGNOSIS — J4540 Moderate persistent asthma, uncomplicated: Secondary | ICD-10-CM | POA: Diagnosis not present

## 2017-10-07 ENCOUNTER — Ambulatory Visit (INDEPENDENT_AMBULATORY_CARE_PROVIDER_SITE_OTHER): Payer: Medicare Other | Admitting: *Deleted

## 2017-10-07 DIAGNOSIS — J4540 Moderate persistent asthma, uncomplicated: Secondary | ICD-10-CM | POA: Diagnosis not present

## 2017-10-09 ENCOUNTER — Other Ambulatory Visit: Payer: Self-pay | Admitting: Internal Medicine

## 2017-10-09 DIAGNOSIS — E785 Hyperlipidemia, unspecified: Secondary | ICD-10-CM

## 2017-10-20 DIAGNOSIS — J4540 Moderate persistent asthma, uncomplicated: Secondary | ICD-10-CM

## 2017-10-21 ENCOUNTER — Ambulatory Visit (INDEPENDENT_AMBULATORY_CARE_PROVIDER_SITE_OTHER): Payer: Medicare Other | Admitting: *Deleted

## 2017-10-21 DIAGNOSIS — J4540 Moderate persistent asthma, uncomplicated: Secondary | ICD-10-CM | POA: Diagnosis not present

## 2017-10-28 ENCOUNTER — Encounter: Payer: Self-pay | Admitting: Internal Medicine

## 2017-10-28 ENCOUNTER — Ambulatory Visit (INDEPENDENT_AMBULATORY_CARE_PROVIDER_SITE_OTHER): Payer: Medicare Other | Admitting: Internal Medicine

## 2017-10-28 VITALS — BP 106/64 | HR 96 | Ht 70.0 in | Wt 212.4 lb

## 2017-10-28 DIAGNOSIS — J449 Chronic obstructive pulmonary disease, unspecified: Secondary | ICD-10-CM | POA: Diagnosis not present

## 2017-10-28 DIAGNOSIS — J8489 Other specified interstitial pulmonary diseases: Secondary | ICD-10-CM | POA: Diagnosis not present

## 2017-10-28 MED ORDER — ALBUTEROL SULFATE HFA 108 (90 BASE) MCG/ACT IN AERS: 2.0000 | INHALATION_SPRAY | RESPIRATORY_TRACT | 0 refills | Status: DC | PRN

## 2017-10-28 NOTE — Progress Notes (Addendum)
Subjective:    Patient ID: Edwin [name], male    DOB: March 01, 1941    MRN: [id number]    Brief patient profile:  41   yowm quit smoking in 1985  Former pt of Dr Joya Gaskins with AB/ rhinitis    History of Present Illness  10/23/2014  Ov/ Dr Star Age  Chief Complaint  Patient presents with  . Follow-up    Pt stated productive cough with thick gren mucus x 4 days. Rx for prednisone and avelox were called in on 5/19. Pt states he started feeling better yesterday and that he is still coughing but does not have as much mucus production   Pt did well overall then on his own developed: thick green mucus x 4 days.  We Called in avelox and pred last week 4 days ago, then better over past few days, also nasonex started.  Notes some mucus out of the nose, on the netipot.  rec Finish prednisone and avelox Stay on inhalers Work on Left nostril for neti pot     02/15/2015 acute ov/Edwin [name] re: ?aecopd maint rx with spiriva/ symbicort - 95% better last ov / 5% "never better" x 5 years = daytime sense of pnds >>then worse first of sept 2016   Chief Complaint  Patient presents with  . Acute Visit    pt. of PW-Levaquin x 5days last wk.,last pill 4 days ago.C/o cough-still yellow, thick. Sob with exertion,wheezing when lays down,denies cp or tightness. No fcs.Has PND.  acute onset persisttent cough  wheeze and sob transiently some better p levaquin 500 x 5 days with prominent nasal congestion also Better transiently also with saba, comfortable at rest  rec Augmentin 875 mg take one pill twice daily  X 10 days -   Prednisone 10 mg take  4 each am x 2 days,   2 each am x 2 days,  1 each am x 2 days and stop  Work on inhaler technique:  If not all better call Edwin [name] for sinus CT > did not do  Add pepcid 20 mg at bedtime for any flare of cough or wheeze   > did not do  GERD diet      03/06/2015  f/u ov/Edwin [name] re: copd/ refractory cough x 5 y with sensation of persistent daytime pnds but no excess mucus  on spirva dpi/ symb 160 rare saba  Chief Complaint  Patient presents with  . Follow-up    Pt states he is coughing less, but still producing some yellow sputum. His breathing is unchanged.   rec Stop losartan and start benicar 40-12.5 one half daily  Stop spiriva handihaler and start respimat 2 puffs every am  Stay on pepcid 20 mg at bedtime automatically for now  Please see patient coordinator before you leave today  to schedule sinus CT > neg  For cough > mucinex dm 1200 mg every 12 hours as needed and flutter valve  Work on inhaler technique:  relax and gently blow all the way out then take a nice smooth deep breath back in, triggering the inhaler at same time you start breathing in.  Hold for up to 5 seconds if you can. Blow out thru nose. Rinse and gargle with water when done Please schedule a follow up office visit in 6 weeks, call sooner if needed - bring all active meds in separate bags   Bag 1 automatic/ Bag 2 up to you       04/17/2015  f/u ov/Edwin [name]  re: never 100% better x 5 years with AB/ sinus dz brought all meds as rec on max symb/ spiriva Chief Complaint  Patient presents with  . Follow-up    Pt states his breathing is unchanged. No new co's today.    Doe x MMRC1 = can walk nl pace, flat grade, can't hurry or go uphills or steps s sob   rec Dulera 100 Take 2 puffs first thing in am and then another 2 puffs about 12 hours later and try off symbiort /spiriva  Only use your albuterol  Ok to use ventolin  Before you do an activity that you know makes you short of breath Work on inhaler technique:   05/01/15 phone call. Green mucus > rx augmentin x 10 d  05/08/2015  f/u ov/Edwin [name] re: AB/ chronic rhinitis on singulair/ dulera 100 2bid - best he's felt in years  Chief Complaint  Patient presents with  . Follow-up    Pt states that his cough is much improved. No new co's today. He has not needed albuterol.   No noct symptoms at all / constant urge to clear throat during the day  but the deep prod cough is better on augmentin  rec Augmentin 875 mg take one pill twice daily  X 10 days - take at breakfast and supper with large glass of water.  It would help reduce the usual side effects (diarrhea and yeast infections) if you ate cultured yogurt at lunch.  Stay on dulera 100 Take 2 puffs first thing in am and then another 2 puffs about 12 hours later.  Keep appt with Dr Neldon Mc to discuss elevated IgE on your tests in [facility]    09/23/2017  f/u ov/Edwin [name] re:   ? NSIP  Chief Complaint  Patient presents with  . Follow-up    SOB but nothing out of normal, productive cough clear mucus, No tightness in the chest,taking breo only not singulair and has rescue inhaler if needed hasn't used in a while, Dr. Carmelina Peal suggested come see pulmonary post CT scan  Dyspnea changed spring 2017 : MMRC3 = can't walk 100 yards even at a slow pace at a flat grade s stopping due to sob    Cough: "all the time" s pattern x less when asleep Sleep: min am congestion /mucoid only  SABA use:  Not much  At all/ prednisone helped all his symptoms quite a bit  rec Stop breo  symbicort 160 Take 2 puffs first thing in am and then another 2 puffs about 12 hours later until you use it up then start the symbicort 80  Work on inhaler technique:  Only use your albuterol as a rescue medication to be used if you can't catch your breath  If doing worse at any time > Prednisone 10 mg take  4 each am x 2 days,   2 each am x 2 days,  1 each am x 2 days and stop  Protonix 40 mg   30-60 min before first meal of the day and Pepcid (famotidine)  20 mg one @  bedtime until return to office - this is the best way to tell whether stomach acid is contributing to your problem.   GERD  Please schedule a follow up office visit in 4 weeks, sooner if needed  with all medications /inhalers/ solutions in hand so we can verify exactly what you are taking. This includes all medications from all doctors and over the  counters    10/28/2017  f/u ov/Edwin [name] re: COPD GOLD 0 = Asthma/? NSIP / has prednisone hasn't needed / still on xolair / better on symb 80 vs BREO and no longer taking singulair  Chief Complaint  Patient presents with  . Follow-up    Breathing is unchanged. No new co's.   Dyspnea:  doing more using less med no more singulair and one half strength symb Cough: some dry cough Sleep: ok SABA use:  Ran out of albuterol x 3 months prior to OV  s any change in symptoms at all in terms of when he would previously use it (with exertion) - recovery time same with or without it    No obvious day to day or daytime variability or assoc excess/ purulent sputum or mucus plugs or hemoptysis or cp or chest tightness, subjective wheeze or overt sinus or hb symptoms. No unusual exposure hx or h/o childhood pna/ asthma or knowledge of premature birth.  Sleeping  Ok flat  without nocturnal  or early am exacerbation  of respiratory  c/o's or need for noct saba. Also denies any obvious fluctuation of symptoms with weather or environmental changes or other aggravating or alleviating factors except as outlined above   Current Allergies, Complete Past Medical History, Past Surgical History, Family History, and Social History were reviewed in [employer] record.  ROS  The following are not active complaints unless bolded Hoarseness, sore throat, dysphagia, dental problems, itching, sneezing,  nasal congestion or discharge of excess mucus or purulent secretions, ear ache,   fever, chills, sweats, unintended wt loss or wt gain, classically pleuritic or exertional cp,  orthopnea pnd or arm/hand swelling  or leg swelling, presyncope, palpitations, abdominal pain, anorexia, nausea, vomiting, diarrhea  or change in bowel habits or change in bladder habits, change in stools or change in urine, dysuria, hematuria,  rash, arthralgias, visual complaints, headache, numbness, weakness or ataxia or problems with  walking or coordination,  change in mood or  memory.        Current Meds  Medication Sig  . albuterol (PROVENTIL HFA) 108 (90 Base) MCG/ACT inhaler Inhale 2 puffs into the lungs every 4 (four) hours as needed for wheezing or shortness of breath. 1-2 puffs every 4 hrs as needed.  [name] Kitchen allopurinol (ZYLOPRIM) 300 MG tablet TAKE 1 TABLET (300 MG TOTAL) BY MOUTH DAILY.  [name] Kitchen atorvastatin (LIPITOR) 20 MG tablet Take 1 tablet (20 mg total) by mouth daily.  . brimonidine (ALPHAGAN P) 0.1 % SOLN 1 drop 2 (two) times daily. Both eyes   . budesonide-formoterol (SYMBICORT) 80-4.5 MCG/ACT inhaler Inhale 2 puffs into the lungs 2 (two) times daily.  [name] Kitchen dextromethorphan-guaiFENesin (MUCINEX DM) 30-600 MG 12hr tablet Take 1 tablet by mouth daily as needed for cough.   . EPINEPHrine 0.3 mg/0.3 mL IJ SOAJ injection Inject 0.3 mLs (0.3 mg total) into the muscle once.  . famotidine (PEPCID) 20 MG tablet One at bedtime  . FLUoxetine (PROZAC) 10 MG capsule TAKE 1 CAPSULE (10 MG TOTAL) BY MOUTH DAILY.  [name] Kitchen LUMIGAN 0.01 % SOLN   . olmesartan-hydrochlorothiazide (BENICAR HCT) 40-12.5 MG tablet Take 0.5 tablets by mouth daily.  . pantoprazole (PROTONIX) 40 MG tablet Take 1 tablet (40 mg total) by mouth daily. Take 30-60 min before first meal of the day  . psyllium (METAMUCIL) 58.6 % powder Take 1 packet by mouth daily.    . [DISCONTINUED] albuterol (PROVENTIL HFA) 108 (90 BASE) MCG/ACT inhaler Inhale 2 puffs into the lungs every 4 (four) hours as  needed for wheezing or shortness of breath. 1-2 puffs every 4 hrs as needed.   Current Facility-Administered Medications for the 10/28/17 encounter (Office Visit) with Tanda Rockers, MD  Medication  . omalizumab Arvid Right) injection 375 mg                   Objective:    amb wm nad mod hoarse     03/06/2015         207 >  04/17/2015  207 > 05/08/2015  209  > 09/23/2017   210 > 10/28/2017   212    02/15/15 209 lb (94.802 kg)  01/08/15 210 lb (95.255 kg)  12/26/14 209 lb  4 oz (94.915 kg)    Vital signs reviewed - Note on arrival 02 sats  92% on RA          HEENT: nl dentition, turbinates bilaterally, and oropharynx. Nl external ear canals without cough reflex   NECK :  without JVD/Nodes/TM/ nl carotid upstrokes bilaterally   LUNGS: no acc muscle use,  Nl contour chest with insp / exp rhonchi / pseudo wheezing  without cough on insp or exp maneuvers   CV:  RRR  no s3 or murmur or increase in P2, and no edema   ABD:  soft and nontender with nl inspiratory excursion in the supine position. No bruits or organomegaly appreciated, bowel sounds nl  MS:  Nl gait/ ext warm without deformities, calf tenderness, cyanosis or clubbing No obvious joint restrictions   SKIN: warm and dry without lesions    NEURO:  alert, approp, nl sensorium with  no motor or cerebellar deficits apparent.                   Assessment & Plan:

## 2017-10-28 NOTE — Patient Instructions (Signed)
No change in medication recommendations   Please schedule a follow up office visit in 6 weeks, call sooner if needed with PFTs on return

## 2017-10-29 ENCOUNTER — Encounter: Payer: Self-pay | Admitting: Internal Medicine

## 2017-10-29 NOTE — Assessment & Plan Note (Addendum)
PFTs 01/08/11 with min airflow obstruction ONO 8/12: normal , no desaturation 03/2011 CT chest neg bronchiectasis CT sinus: mild maxillary sinusitis  - 04/17/2015  Walked RA x 3 laps @ 185 ft each stopped due to End of study, brisk  pace, no   desat  Mild sob - spirometry 04/17/2015  No signficant airflow obst  - PFT's 09/09/17   FEV1 2.14 (69 % ) ratio 74  p brei 200 prior to study with DLCO  56 % corrects to 82 % for alv volume   - Spirometry 09/23/2017  FEV1 2.07 (68%)  Ratio 70 p breo 200 but actively coughing - FENO 09/23/2017   =   10 on breo 200   - 09/23/2017  After extensive coaching inhaler device  effectiveness =    90% try symbicort 160  X 2 week sample then step down to 80 2bid > no change symptoms 10/28/2017    In this case it appears that less may be more (lower dose symbicort/ no saba/ no singulair   rec continue symb 80 2bid but advised should always have saba on hand for rescue.

## 2017-10-29 NOTE — Assessment & Plan Note (Signed)
See HRCT  08/24/17 c/w nsip vs boop  - PFT's  09/09/17  FVC  2.88 (67% pred vs 2.86 06/06/15)  with DLCO  56 % corrects to 82 % for alv volume  - ESR 09/09/17  =  3  - Collagen vasc profile 09/09/17 >>> neg - HSP profile 09/09/17> neg  - 09/23/2017  Walked RA x 3 laps @ 185 ft each stopped due to  End of study, nl to fast  pace, no   Desat but sob at end     He has improved since last ov on no specific rx for NSIP which typically has a very benign course anyway as compared to UIP so rec no change in rx and f/u with pfts at 6 m = 03/05/18 form last one to get more points on the curve  I had an extended discussion with the patient reviewing all relevant studies completed to date and  lasting 15 to 20 minutes of a 25 minute visit    Discussed in detail all the  indications, usual  risks and alternatives  relative to the benefits with patient who agrees to proceed with conservative f/u as outlined   Each maintenance medication was reviewed in detail including most importantly the difference between maintenance and prns and under what circumstances the prns are to be triggered using an action plan format that is not reflected in the computer generated alphabetically organized AVS.    Please see AVS for specific instructions unique to this visit that I personally wrote and verbalized to the the pt in detail and then reviewed with pt  by my nurse highlighting any  changes in therapy recommended at today's visit to their plan of care.

## 2017-11-03 DIAGNOSIS — J4540 Moderate persistent asthma, uncomplicated: Secondary | ICD-10-CM | POA: Diagnosis not present

## 2017-11-04 ENCOUNTER — Ambulatory Visit (INDEPENDENT_AMBULATORY_CARE_PROVIDER_SITE_OTHER): Payer: Medicare Other

## 2017-11-04 DIAGNOSIS — J4540 Moderate persistent asthma, uncomplicated: Secondary | ICD-10-CM | POA: Diagnosis not present

## 2017-11-10 ENCOUNTER — Ambulatory Visit (INDEPENDENT_AMBULATORY_CARE_PROVIDER_SITE_OTHER): Payer: Medicare Other | Admitting: *Deleted

## 2017-11-10 VITALS — BP 138/64 | HR 88 | Resp 18 | Ht 70.0 in | Wt 215.0 lb

## 2017-11-10 DIAGNOSIS — Z0000 Encounter for general adult medical examination without abnormal findings: Secondary | ICD-10-CM | POA: Diagnosis not present

## 2017-11-10 NOTE — Patient Instructions (Addendum)
Continue doing brain stimulating activities (puzzles, reading, adult coloring books, staying active) to keep memory sharp.   Continue to eat heart healthy diet (full of fruits, vegetables, whole grains, lean protein, water--limit salt, fat, and sugar intake) and increase physical activity as tolerated.  [facility] in [place], [place] in: [place] Address: [place], [place], [place] [address] Phone: [phone]  [facility] [place] [facility]ologist in [place], [place] Address: [address], [place], [place] [address] Hours:    Edwin [name] , Thank you for taking time to come for your Medicare Wellness Visit. I appreciate your ongoing commitment to your health goals. Please review the following plan we discussed and let me know if I can assist you in the future.   These are the goals we discussed: Goals    . Patient Stated     Increase physical activity by starting to walk with my wife. I think we can do this on our property several times a week.       This is a list of the screening recommended for you and due dates:  Health Maintenance  Topic Date Due  . Flu Shot  12/31/2017  . Tetanus Vaccine  01/22/2026  . Pneumonia vaccines  Completed      Health Maintenance, Male A healthy lifestyle and preventive care is important for your health and wellness. Ask your health care provider about what schedule of regular examinations is right for you. What should I know about weight and diet? Eat a Healthy Diet  Eat plenty of vegetables, fruits, whole grains, low-fat dairy products, and lean protein.  Do not eat a lot of foods high in solid fats, added sugars, or salt.  Maintain a Healthy Weight Regular exercise can help you achieve or maintain a healthy weight. You should:  Do at least 150 minutes of exercise each week. The exercise should increase your heart rate and make you sweat  (moderate-intensity exercise).  Do strength-training exercises at least twice a week.  Watch Your Levels of Cholesterol and Blood Lipids  Have your blood tested for lipids and cholesterol every 5 years starting at 76 years of age. If you are at high risk for heart disease, you should start having your blood tested when you are 76 years old. You may need to have your cholesterol levels checked more often if: ? Your lipid or cholesterol levels are high. ? You are older than 76 years of age. ? You are at high risk for heart disease.  What should I know about cancer screening? Many types of cancers can be detected early and may often be prevented. Lung Cancer  You should be screened every year for lung cancer if: ? You are a current smoker who has smoked for at least 30 years. ? You are a former smoker who has quit within the past 15 years.  Talk to your health care provider about your screening options, when you should start screening, and how often you should be screened.  Colorectal Cancer  Routine colorectal cancer screening usually begins at 76 years of age and should be repeated every 5-10 years until you are 76 years old. You may need to be screened more often if early forms of precancerous polyps or small growths are found. Your health care provider may recommend screening at an earlier age if you have risk factors for colon cancer.  Your health care provider may recommend using home test kits  to check for hidden blood in the stool.  A small camera at the end of a tube can be used to examine your colon (sigmoidoscopy or colonoscopy). This checks for the earliest forms of colorectal cancer.  Prostate and Testicular Cancer  Depending on your age and overall health, your health care provider may do certain tests to screen for prostate and testicular cancer.  Talk to your health care provider about any symptoms or concerns you have about testicular or prostate cancer.  Skin  Cancer  Check your skin from head to toe regularly.  Tell your health care provider about any new moles or changes in moles, especially if: ? There is a change in a mole's size, shape, or color. ? You have a mole that is larger than a pencil eraser.  Always use sunscreen. Apply sunscreen liberally and repeat throughout the day.  Protect yourself by wearing long sleeves, pants, a wide-brimmed hat, and sunglasses when outside.  What should I know about heart disease, diabetes, and high blood pressure?  If you are 47-63 years of age, have your blood pressure checked every 3-5 years. If you are 64 years of age or older, have your blood pressure checked every year. You should have your blood pressure measured twice-once when you are at a hospital or clinic, and once when you are not at a hospital or clinic. Record the average of the two measurements. To check your blood pressure when you are not at a hospital or clinic, you can use: ? An automated blood pressure machine at a pharmacy. ? A home blood pressure monitor.  Talk to your health care provider about your target blood pressure.  If you are between 32-35 years old, ask your health care provider if you should take aspirin to prevent heart disease.  Have regular diabetes screenings by checking your fasting blood sugar level. ? If you are at a normal weight and have a low risk for diabetes, have this test once every three years after the age of 43. ? If you are overweight and have a high risk for diabetes, consider being tested at a younger age or more often.  A one-time screening for abdominal aortic aneurysm (AAA) by ultrasound is recommended for men aged 29-75 years who are current or former smokers. What should I know about preventing infection? Hepatitis B If you have a higher risk for hepatitis B, you should be screened for this virus. Talk with your health care provider to find out if you are at risk for hepatitis B  infection. Hepatitis C Blood testing is recommended for:  Everyone born from [birth year] through 1964.  Anyone with known risk factors for hepatitis C.  Sexually Transmitted Diseases (STDs)  You should be screened each year for STDs including gonorrhea and chlamydia if: ? You are sexually active and are younger than 76 years of age. ? You are older than 76 years of age and your health care provider tells you that you are at risk for this type of infection. ? Your sexual activity has changed since you were last screened and you are at an increased risk for chlamydia or gonorrhea. Ask your health care provider if you are at risk.  Talk with your health care provider about whether you are at high risk of being infected with HIV. Your health care provider may recommend a prescription medicine to help prevent HIV infection.  What else can I do?  Schedule regular health, dental, and eye exams.  Stay  current with your vaccines (immunizations).  Do not use any tobacco products, such as cigarettes, chewing tobacco, and e-cigarettes. If you need help quitting, ask your health care provider.  Limit alcohol intake to no more than 2 drinks per day. One drink equals 12 ounces of beer, 5 ounces of Kerah Hardebeck, or 1 ounces of hard liquor.  Do not use street drugs.  Do not share needles.  Ask your health care provider for help if you need support or information about quitting drugs.  Tell your health care provider if you often feel depressed.  Tell your health care provider if you have ever been abused or do not feel safe at home. This information is not intended to replace advice given to you by your health care provider. Make sure you discuss any questions you have with your health care provider. Document Released: 11/15/2007 Document Revised: 01/16/2016 Document Reviewed: 02/20/2015 Elsevier Interactive Patient Education  [employer].

## 2017-11-10 NOTE — Progress Notes (Addendum)
Subjective:   Edwin [name] is a 77 y.o. male who presents for Medicare Annual/Subsequent preventive examination.  Review of Systems:  No ROS.  Medicare Wellness Visit. Additional risk factors are reflected in the social history.  Cardiac Risk Factors include: advanced age (>75men, >78 women);diabetes mellitus;dyslipidemia;male gender;hypertension Sleep patterns: does not get up to void and sleeps 6-7 hours nightly.    Home Safety/Smoke Alarms: Feels safe in home. Smoke alarms in place.  Living environment; residence and Firearm Safety: 1-story house/ trailer, no firearms., Lives with wife, no needs for DME, good support system Seat Belt Safety/Bike Helmet: Wears seat belt.     Objective:    Vitals: BP 138/64   Pulse 88   Resp 18   Ht 5\' 10"  (1.778 m)   Wt 215 lb (97.5 kg)   SpO2 98%   BMI 30.85 kg/m   Body mass index is 30.85 kg/m.  Advanced Directives 11/10/2017 03/04/2016 01/06/2016 02/15/2015 10/23/2014 08/30/2013  Does Patient Have a Medical Advance Directive? Yes Yes Yes No;Yes Yes Patient does not have advance directive  Type of Advance Directive [place];Living will [place];Living will [place];Living will Living will [place];Living will -  Does patient want to make changes to medical advance directive? - No - Patient declined No - Patient declined - - -  Copy of [place] in Chart? No - copy requested Yes No - copy requested - - -  Pre-existing out of facility DNR order (yellow form or pink MOST form) - - - - - No    Tobacco Social History   Tobacco Use  Smoking Status Former Smoker  . Packs/day: 2.00  . Years: 25.00  . Pack years: 50.00  . Types: Cigarettes  . Last attempt to quit: 06/03/1983  . Years since quitting: 34.4  Smokeless Tobacco Never Used  Tobacco Comment   up to 2 ppd     Counseling given: Not Answered Comment: up to 2 ppd  Past Medical History:   Diagnosis Date  . Asthma   . COPD (chronic obstructive pulmonary disease) (HCC)    reactive airway disease  . Glaucoma   . Gout   . Hx of colonic polyps    Dr Earlean Shawl  . Hyperlipidemia    NMR lipoprofile 2005: LDL 113(1416/825), HLD 37, TG 190.  [name] Kitchen Hypertension   . Pneumonia    Past Surgical History:  Procedure Laterality Date  . CATARACT EXTRACTION     bilat, implants  . CHOLECYSTECTOMY  1995  . LUMBAR LAMINECTOMY  1982  . POLYPECTOMY  2003    neg 2006, Dr.Medoff  . tear duct surgery  1998   for excess tearing, skin graft L foot @ age 96 1/2   Family History  Problem Relation Age of Onset  . Heart attack Father 74       due to asthma flare  . Asthma Father   . Emphysema Father   . Hypertension Mother   . Cancer Mother         ? primary  . Asthma Son        x2   Social History   Socioeconomic History  . Marital status: Married    Spouse name: Not on file  . Number of children: 2  . Years of education: Not on file  . Highest education level: Not on file  Occupational History  . Occupation: Charity fundraiser  . Occupation: PRESIDENT  Employer: [employer]  Social Needs  . Financial resource strain: Not hard at all  . Food insecurity:    Worry: Never true    Inability: Never true  . Transportation needs:    Medical: No    Non-medical: No  Tobacco Use  . Smoking status: Former Smoker    Packs/day: 2.00    Years: 25.00    Pack years: 50.00    Types: Cigarettes    Last attempt to quit: 06/03/1983    Years since quitting: 34.4  . Smokeless tobacco: Never Used  . Tobacco comment: up to 2 ppd  Substance and Sexual Activity  . Alcohol use: No    Comment: socially; 6-8 / week  . Drug use: No  . Sexual activity: Yes  Lifestyle  . Physical activity:    Days per week: 0 days    Minutes per session: 0 min  . Stress: Only a little  Relationships  . Social connections:    Talks on phone: More than three times a week    Gets together: More than three times a week      Attends religious service: More than 4 times per year    Active member of club or organization: Yes    Attends meetings of clubs or organizations: Never    Relationship status: Married  Other Topics Concern  . Not on file  Social History Narrative   Regular Exercise- no          Outpatient Encounter Medications as of 11/10/2017  Medication Sig  . albuterol (PROVENTIL HFA) 108 (90 Base) MCG/ACT inhaler Inhale 2 puffs into the lungs every 4 (four) hours as needed for wheezing or shortness of breath. 1-2 puffs every 4 hrs as needed.  [name] Kitchen allopurinol (ZYLOPRIM) 300 MG tablet TAKE 1 TABLET (300 MG TOTAL) BY MOUTH DAILY.  [name] Kitchen atorvastatin (LIPITOR) 20 MG tablet Take 1 tablet (20 mg total) by mouth daily.  . brimonidine (ALPHAGAN P) 0.1 % SOLN 1 drop 2 (two) times daily. Both eyes   . budesonide-formoterol (SYMBICORT) 80-4.5 MCG/ACT inhaler Inhale 2 puffs into the lungs 2 (two) times daily.  [name] Kitchen dextromethorphan-guaiFENesin (MUCINEX DM) 30-600 MG 12hr tablet Take 1 tablet by mouth daily as needed for cough.   . EPINEPHrine 0.3 mg/0.3 mL IJ SOAJ injection Inject 0.3 mLs (0.3 mg total) into the muscle once.  . famotidine (PEPCID) 20 MG tablet One at bedtime  . FLUoxetine (PROZAC) 10 MG capsule TAKE 1 CAPSULE (10 MG TOTAL) BY MOUTH DAILY.  [name] Kitchen LUMIGAN 0.01 % SOLN   . olmesartan-hydrochlorothiazide (BENICAR HCT) 40-12.5 MG tablet Take 0.5 tablets by mouth daily.  . pantoprazole (PROTONIX) 40 MG tablet Take 1 tablet (40 mg total) by mouth daily. Take 30-60 min before first meal of the day  . psyllium (METAMUCIL) 58.6 % powder Take 1 packet by mouth daily.    . predniSONE (DELTASONE) 10 MG tablet Take  4 each am x 2 days,   2 each am x 2 days,  1 each am x 2 days and stop (Patient not taking: Reported on 10/28/2017)   Facility-Administered Encounter Medications as of 11/10/2017  Medication  . omalizumab Arvid Right) injection 375 mg    Activities of Daily Living In your present state of health, do you  have any difficulty performing the following activities: 11/10/2017  Hearing? N  Vision? N  Difficulty concentrating or making decisions? N  Walking or climbing stairs? N  Dressing or bathing? N  Doing errands, shopping? N  Preparing Food and eating ? N  Using the Toilet? N  In the past six months, have you accidently leaked urine? N  Do you have problems with loss of bowel control? N  Managing your Medications? N  Managing your Finances? N  Housekeeping or managing your Housekeeping? N  Some recent data might be hidden    Patient Care Team: Janith Lima, MD as PCP - General (Internal Medicine) Tanda Rockers, MD as Consulting Physician (Pulmonary Disease) Neldon Mc Donnamarie Poag, MD as Consulting Physician (Allergy and Immunology)   Assessment:   This is a routine wellness examination for Emm[place]. Physical assessment deferred to PCP.   Exercise Activities and Dietary recommendations Current Exercise Habits: The patient does not participate in regular exercise at present  Diet (meal preparation, eat out, water intake, caffeinated beverages, dairy products, fruits and vegetables): in general, a "healthy" diet  , well balanced   Reviewed heart healthy diet, Encouraged patient to increase daily water and fluid intake. Discussed strategies for weight loss.      Goals    . Patient Stated     Increase physical activity by starting to walk with my wife. I think we can do this on our property several times a week.       Fall Risk Fall Risk  11/10/2017 06/03/2017 03/04/2016 01/09/2015 01/08/2015  Falls in the past year? No No No No No   Depression Screen PHQ 2/9 Scores 11/10/2017 06/03/2017 03/04/2016 01/09/2015  PHQ - 2 Score 1 4 0 0  PHQ- 9 Score 4 14 - -    Cognitive Function       Ad8 score reviewed for issues:  Issues making decisions: no  Less interest in hobbies / activities: no  Repeats questions, stories (family complaining): no  Trouble using ordinary gadgets (microwave,  computer, phone):no  Forgets the month or year: no  Mismanaging finances: no  Remembering appts: no  Daily problems with thinking and/or memory: no Ad8 score is= 0  Immunization History  Administered Date(s) Administered  . Influenza Split 02/19/2012  . Influenza Whole 03/09/2010, 03/17/2011  . Influenza, High Dose Seasonal PF 01/18/2015, 03/03/2016, 02/17/2017  . Influenza-Unspecified 03/11/2013, 03/04/2014  . Pneumococcal Conjugate-13 03/09/2008, 01/23/2016  . Pneumococcal Polysaccharide-23 07/08/2012  . Td 06/02/2006  . Tdap 01/23/2016  . Zoster 06/26/2010    Screening Tests Health Maintenance  Topic Date Due  . INFLUENZA VACCINE  12/31/2017  . TETANUS/TDAP  01/22/2026  . PNA vac Low Risk Adult  Completed        Plan:    I have personally reviewed and noted the following in the patient's chart:   . Medical and social history . Use of alcohol, tobacco or illicit drugs  . Current medications and supplements . Functional ability and status . Nutritional status . Physical activity . Advanced directives . List of other physicians . Vitals . Screenings to include cognitive, depression, and falls . Referrals and appointments  In addition, I have reviewed and discussed with patient certain preventive protocols, quality metrics, and best practice recommendations. A written personalized care plan for preventive services as well as general preventive health recommendations were provided to patient.     Michiel Cowboy, RN  11/10/2017  Medical screening examination/treatment/procedure(s) were performed by non-physician practitioner and as supervising physician I was immediately available for consultation/collaboration. I agree with above. Scarlette Calico, MD

## 2017-11-11 DIAGNOSIS — H401132 Primary open-angle glaucoma, bilateral, moderate stage: Secondary | ICD-10-CM | POA: Diagnosis not present

## 2017-11-11 DIAGNOSIS — H53431 Sector or arcuate defects, right eye: Secondary | ICD-10-CM | POA: Diagnosis not present

## 2017-11-17 DIAGNOSIS — J4540 Moderate persistent asthma, uncomplicated: Secondary | ICD-10-CM | POA: Diagnosis not present

## 2017-11-18 ENCOUNTER — Ambulatory Visit (INDEPENDENT_AMBULATORY_CARE_PROVIDER_SITE_OTHER): Payer: Medicare Other | Admitting: *Deleted

## 2017-11-18 DIAGNOSIS — J4540 Moderate persistent asthma, uncomplicated: Secondary | ICD-10-CM

## 2017-11-30 ENCOUNTER — Other Ambulatory Visit: Payer: Self-pay | Admitting: Internal Medicine

## 2017-12-08 DIAGNOSIS — J4540 Moderate persistent asthma, uncomplicated: Secondary | ICD-10-CM | POA: Diagnosis not present

## 2017-12-09 ENCOUNTER — Ambulatory Visit (INDEPENDENT_AMBULATORY_CARE_PROVIDER_SITE_OTHER): Payer: Medicare Other | Admitting: *Deleted

## 2017-12-09 DIAGNOSIS — J4540 Moderate persistent asthma, uncomplicated: Secondary | ICD-10-CM

## 2017-12-11 ENCOUNTER — Encounter: Payer: Self-pay | Admitting: Internal Medicine

## 2017-12-11 ENCOUNTER — Ambulatory Visit (INDEPENDENT_AMBULATORY_CARE_PROVIDER_SITE_OTHER): Payer: Medicare Other | Admitting: Internal Medicine

## 2017-12-11 VITALS — BP 118/78 | HR 78 | Ht 70.5 in | Wt 215.0 lb

## 2017-12-11 DIAGNOSIS — J449 Chronic obstructive pulmonary disease, unspecified: Secondary | ICD-10-CM | POA: Diagnosis not present

## 2017-12-11 DIAGNOSIS — J8489 Other specified interstitial pulmonary diseases: Secondary | ICD-10-CM | POA: Diagnosis not present

## 2017-12-11 LAB — PULMONARY FUNCTION TEST
DL/VA % PRED: 84 %
DL/VA: 3.92 ml/min/mmHg/L
DLCO UNC % PRED: 55 %
DLCO UNC: 18.18 ml/min/mmHg
FEF 25-75 POST: 1.84 L/s
FEF 25-75 PRE: 1.59 L/s
FEF2575-%Change-Post: 15 %
FEF2575-%PRED-POST: 83 %
FEF2575-%PRED-PRE: 72 %
FEV1-%Change-Post: 2 %
FEV1-%PRED-PRE: 69 %
FEV1-%Pred-Post: 70 %
FEV1-Post: 2.18 L
FEV1-Pre: 2.13 L
FEV1FVC-%Change-Post: 1 %
FEV1FVC-%PRED-PRE: 103 %
FEV6-%CHANGE-POST: 0 %
FEV6-%PRED-POST: 71 %
FEV6-%Pred-Pre: 70 %
FEV6-POST: 2.85 L
FEV6-Pre: 2.82 L
FEV6FVC-%CHANGE-POST: 0 %
FEV6FVC-%PRED-POST: 104 %
FEV6FVC-%Pred-Pre: 105 %
FVC-%Change-Post: 1 %
FVC-%Pred-Post: 67 %
FVC-%Pred-Pre: 66 %
FVC-Post: 2.9 L
FVC-Pre: 2.85 L
POST FEV1/FVC RATIO: 75 %
PRE FEV1/FVC RATIO: 75 %
Post FEV6/FVC ratio: 98 %
Pre FEV6/FVC Ratio: 99 %
RV % pred: 94 %
RV: 2.46 L
TLC % PRED: 75 %
TLC: 5.36 L

## 2017-12-11 NOTE — Progress Notes (Signed)
PFT completed today. 12/11/17

## 2017-12-11 NOTE — Patient Instructions (Addendum)
To get the most out of exercise, you need to be continuously aware that you are short of breath, but never out of breath, for 30 minutes daily. As you improve, it will actually be easier for you to do the same amount of exercise  in  30 minutes so always push to the level where you are short of breath and record your oxygen saturation toward the end of exercise and see if there is any definite trend as most likely it will hold above 90% except with extreme exercise which you do not need to attempt.   Please schedule a follow up visit in 6  months but call sooner if needed with pfts on return.

## 2017-12-11 NOTE — Progress Notes (Signed)
Subjective:    Patient ID: Edwin [name], male    DOB: 01-Aug-1940    MRN: [id number]    Brief patient profile:  67   yowm quit smoking in 1985  Former pt of Dr Joya Gaskins with longstanding  AB/ rhinitis / NSIP dx  08/24/17   On xolair since summer of 2017 for AB/Rhinitis with elevated IgE per Dr Neldon Mc    History of Present Illness  10/23/2014  Ov/ Dr Star Age  Chief Complaint  Patient presents with  . Follow-up    Pt stated productive cough with thick gren mucus x 4 days. Rx for prednisone and avelox were called in on 5/19. Pt states he started feeling better yesterday and that he is still coughing but does not have as much mucus production   Pt did well overall then on his own developed: thick green mucus x 4 days.  We Called in avelox and pred last week 4 days ago, then better over past few days, also nasonex started.  Notes some mucus out of the nose, on the netipot.  rec Finish prednisone and avelox Stay on inhalers Work on Left nostril for neti pot        05/08/2015  f/u ov/Edwin [name] re: AB/ chronic rhinitis on singulair/ dulera 100 2bid - best he's felt in years  Chief Complaint  Patient presents with  . Follow-up    Pt states that his cough is much improved. No new co's today. He has not needed albuterol.   No noct symptoms at all / constant urge to clear throat during the day but the deep prod cough is better on augmentin  rec Augmentin 875 mg take one pill twice daily  X 10 days - take at breakfast and supper with large glass of water.  It would help reduce the usual side effects (diarrhea and yeast infections) if you ate cultured yogurt at lunch.  Stay on dulera 100 Take 2 puffs first thing in am and then another 2 puffs about 12 hours later.  Keep appt with Dr Neldon Mc to discuss elevated IgE on your tests in EP[facility]   On xolair since summer of 2017 for AB/Rhinitis with elevated IgE per Dr Neldon Mc   09/23/2017  f/u ov/Edwin [name] re:   ? NSIP  Chief Complaint  Patient presents with    . Follow-up    SOB but nothing out of normal, productive cough clear mucus, No tightness in the chest,taking breo only not singulair and has rescue inhaler if needed hasn't used in a while, Dr. Carmelina Peal suggested come see pulmonary post CT scan  Dyspnea changed spring 2017 : MMRC3 = can't walk 100 yards even at a slow pace at a flat grade s stopping due to sob    Cough: "all the time" s pattern x less when asleep Sleep: min am congestion /mucoid only  SABA use:  Not much  At all/ prednisone helped all his symptoms quite a bit  rec Stop breo  symbicort 160 Take 2 puffs first thing in am and then another 2 puffs about 12 hours later until you use it up then start the symbicort 80  Work on inhaler technique:  Only use your albuterol as a rescue medication to be used if you can't catch your breath  If doing worse at any time > Prednisone 10 mg take  4 each am x 2 days,   2 each am x 2 days,  1 each am x 2 days and stop  Protonix  40 mg   30-60 min before first meal of the day and Pepcid (famotidine)  20 mg one @  bedtime until return to office - this is the best way to tell whether stomach acid is contributing to your problem.   GERD  Please schedule a follow up office visit in 4 weeks, sooner if needed  with all medications /inhalers/ solutions in hand so we can verify exactly what you are taking. This includes all medications from all doctors and over the counters    10/28/2017  f/u ov/Edwin [name] re: COPD GOLD 0 = Asthma/? NSIP / has prednisone hasn't needed / still on xolair / better on symb 80 vs BREO and no longer taking singulair  Chief Complaint  Patient presents with  . Follow-up    Breathing is unchanged. No new co's.   Dyspnea:  doing more using less med no more singulair and one half strength symb Cough: some dry cough Sleep: ok SABA use:  Ran out of albuterol x 3 months prior to OV  s any change in symptoms at all in terms of when he would previously use it (with exertion) - recovery time  same with or without it rec No change in rx    12/11/2017  f/u ov/Edwin [name] re:  Chief Complaint  Patient presents with  . Follow-up    PFT today, shortness of breath, productive cough    Dyspnea:  MMRC2 = can't walk a nl pace on a flat grade s sob but does fine slow and flat / can't do steps since around 2014  Cough: sporadic/ more while awake Sleeping: ok x 1 no hob elevation SABA use: once in 2 weeks 02: no                 Objective:    Pleasant hoarse amb  wm nad     03/06/2015         207 >  04/17/2015  207 > 05/08/2015  209  > 09/23/2017   210 > 10/28/2017   212 > 12/11/2017  215    02/15/15 209 lb (94.802 kg)  01/08/15 210 lb (95.255 kg)  12/26/14 209 lb 4 oz (94.915 kg)     Vital signs reviewed - Note on arrival 02 sats  93% on RA         HEENT: nl dentition, turbinates bilaterally, and oropharynx. Nl external ear canals without cough reflex   NECK :  without JVD/Nodes/TM/ nl carotid upstrokes bilaterally   LUNGS: no acc muscle use,  Nl contour chest with coarse bs and scattered insp/exp rhonchi s true wheezes bilaterally   CV:  RRR  no s3 or murmur or increase in P2, and no edema   ABD:  soft and nontender with nl inspiratory excursion in the supine position. No bruits or organomegaly appreciated, bowel sounds nl  MS:  Nl gait/ ext warm without deformities, calf tenderness, cyanosis - ?Very subtle clubbing No obvious joint restrictions   SKIN: warm and dry without lesions    NEURO:  alert, approp, nl sensorium with  no motor or cerebellar deficits apparent.                     Assessment & Plan:

## 2017-12-12 ENCOUNTER — Encounter: Payer: Self-pay | Admitting: Internal Medicine

## 2017-12-12 DIAGNOSIS — J45909 Unspecified asthma, uncomplicated: Secondary | ICD-10-CM | POA: Insufficient documentation

## 2017-12-12 NOTE — Assessment & Plan Note (Signed)
PFTs 01/08/11 with min airflow obstruction ONO 8/12: normal , no desaturation 03/2011 CT chest neg bronchiectasis CT sinus: mild maxillary sinusitis  - 04/17/2015  Walked RA x 3 laps @ 185 ft each stopped due to End of study, brisk  pace, no   desat  Mild sob - spirometry 04/17/2015  No signficant airflow obst  - PFT's 09/09/17   FEV1 2.14 (69 % ) ratio 74  p brei 200 prior to study with DLCO  56 % corrects to 82 % for alv volume   - Spirometry 09/23/2017  FEV1 2.07 (68%)  Ratio 70 p breo 200 but actively coughing - FENO 09/23/2017   =   10 on breo 200 - 09/23/2017  After extensive coaching inhaler device  effectiveness =    90% try symbicort 160  X 2 week sample then step down to 80 2bid > no change symptoms 10/28/2017   -  PFTs 12/11/2017  FEV1 2.18 (70 % ) ratio 75  p 2 % improvement from saba p symb 80 x 2 pffs and rhonchi on exam minimal curvature to f/v loop    Adequate control on present rx, reviewed in detail with pt > no change in rx needed

## 2017-12-12 NOTE — Assessment & Plan Note (Signed)
See HRCT  08/24/17 c/w nsip vs boop  - PFT's  09/09/17  FVC  2.88 (67% pred vs 2.86 06/06/15)  with DLCO  56 % corrects to 82 % for alv volume  - ESR 09/09/17  =  3  - Collagen vasc profile 09/09/17 >>> neg - HSP profile 09/09/17> neg  - 09/23/2017  Walked RA x 3 laps @ 185 ft each stopped due to  End of study, nl to fast  pace, no   Desat but sob at end      - PFT's  12/11/2017  FVC  2.85 (66%)   with DLCO  55 % corrects to 84 % for alv volume   - 12/11/2017  Walked RA x 3 laps @ 185 ft each stopped due to end of study, brisk pace, min sob with sats 92% at end   No evidence of dz progression c/w NSIP which usually follows a relatively benign course.  rec regular sub max ex 30 min weekly monitoring 02 sats and f/u in 6 m for f/u walk/ pfts unless clinical decline in interim

## 2017-12-15 ENCOUNTER — Other Ambulatory Visit: Payer: Self-pay | Admitting: Internal Medicine

## 2017-12-18 ENCOUNTER — Ambulatory Visit: Payer: Self-pay

## 2017-12-24 DIAGNOSIS — J4540 Moderate persistent asthma, uncomplicated: Secondary | ICD-10-CM | POA: Diagnosis not present

## 2017-12-25 ENCOUNTER — Ambulatory Visit (INDEPENDENT_AMBULATORY_CARE_PROVIDER_SITE_OTHER): Payer: Medicare Other

## 2017-12-25 DIAGNOSIS — J4540 Moderate persistent asthma, uncomplicated: Secondary | ICD-10-CM

## 2018-01-05 DIAGNOSIS — J4540 Moderate persistent asthma, uncomplicated: Secondary | ICD-10-CM

## 2018-01-06 ENCOUNTER — Ambulatory Visit (INDEPENDENT_AMBULATORY_CARE_PROVIDER_SITE_OTHER): Payer: Medicare Other | Admitting: *Deleted

## 2018-01-06 ENCOUNTER — Other Ambulatory Visit: Payer: Self-pay | Admitting: Internal Medicine

## 2018-01-06 DIAGNOSIS — J4540 Moderate persistent asthma, uncomplicated: Secondary | ICD-10-CM

## 2018-01-08 ENCOUNTER — Other Ambulatory Visit: Payer: Self-pay | Admitting: Internal Medicine

## 2018-01-08 ENCOUNTER — Telehealth: Payer: Self-pay | Admitting: Internal Medicine

## 2018-01-08 MED ORDER — BUDESONIDE-FORMOTEROL FUMARATE 80-4.5 MCG/ACT IN AERO
2.0000 | INHALATION_SPRAY | Freq: Two times a day (BID) | RESPIRATORY_TRACT | 5 refills | Status: DC
Start: 2018-01-08 — End: 2018-01-08

## 2018-01-08 MED ORDER — BUDESONIDE-FORMOTEROL FUMARATE 80-4.5 MCG/ACT IN AERO: 2.0000 | INHALATION_SPRAY | Freq: Two times a day (BID) | RESPIRATORY_TRACT | 5 refills | Status: DC

## 2018-01-08 NOTE — Telephone Encounter (Signed)
Spoke with the pt and notified rx for symbicort was refilled  Nothing further needed

## 2018-01-16 ENCOUNTER — Other Ambulatory Visit: Payer: Self-pay | Admitting: Internal Medicine

## 2018-01-20 ENCOUNTER — Ambulatory Visit: Payer: Self-pay

## 2018-01-20 DIAGNOSIS — J4540 Moderate persistent asthma, uncomplicated: Secondary | ICD-10-CM

## 2018-01-21 ENCOUNTER — Ambulatory Visit (INDEPENDENT_AMBULATORY_CARE_PROVIDER_SITE_OTHER): Payer: Medicare Other | Admitting: *Deleted

## 2018-01-21 DIAGNOSIS — J4540 Moderate persistent asthma, uncomplicated: Secondary | ICD-10-CM

## 2018-02-03 DIAGNOSIS — J4540 Moderate persistent asthma, uncomplicated: Secondary | ICD-10-CM | POA: Diagnosis not present

## 2018-02-04 ENCOUNTER — Ambulatory Visit (INDEPENDENT_AMBULATORY_CARE_PROVIDER_SITE_OTHER): Payer: Medicare Other | Admitting: *Deleted

## 2018-02-04 DIAGNOSIS — J4540 Moderate persistent asthma, uncomplicated: Secondary | ICD-10-CM | POA: Diagnosis not present

## 2018-02-09 ENCOUNTER — Other Ambulatory Visit: Payer: Self-pay | Admitting: Internal Medicine

## 2018-02-17 DIAGNOSIS — J4540 Moderate persistent asthma, uncomplicated: Secondary | ICD-10-CM | POA: Diagnosis not present

## 2018-02-18 ENCOUNTER — Other Ambulatory Visit: Payer: Self-pay | Admitting: Allergy and Immunology

## 2018-02-18 ENCOUNTER — Ambulatory Visit (INDEPENDENT_AMBULATORY_CARE_PROVIDER_SITE_OTHER): Payer: Medicare Other | Admitting: *Deleted

## 2018-02-18 DIAGNOSIS — J4540 Moderate persistent asthma, uncomplicated: Secondary | ICD-10-CM | POA: Diagnosis not present

## 2018-02-18 MED ORDER — ACYCLOVIR 200 MG PO CAPS: 200.0000 mg | ORAL_CAPSULE | Freq: Four times a day (QID) | ORAL | 0 refills | Status: AC

## 2018-02-18 NOTE — Telephone Encounter (Signed)
Refill sent in patient advised

## 2018-02-18 NOTE — Telephone Encounter (Signed)
Please refill acyclovir.

## 2018-02-18 NOTE — Telephone Encounter (Signed)
Please advise 

## 2018-02-18 NOTE — Telephone Encounter (Signed)
Patient came in for an injection and needs a refill on  ACYCLOVIR 200mg  sent to [facility] on [address]  Please call patient with any questions

## 2018-02-23 DIAGNOSIS — Z23 Encounter for immunization: Secondary | ICD-10-CM | POA: Diagnosis not present

## 2018-02-27 DIAGNOSIS — M542 Cervicalgia: Secondary | ICD-10-CM | POA: Diagnosis not present

## 2018-02-28 ENCOUNTER — Emergency Department (HOSPITAL_COMMUNITY)
Admission: EM | Admit: 2018-02-28 | Discharge: 2018-02-28 | Disposition: A | Discharge status: Home / Self Care | Payer: Medicare Other | Attending: Emergency Medicine | Admitting: Emergency Medicine

## 2018-02-28 ENCOUNTER — Emergency Department (HOSPITAL_COMMUNITY): Payer: Medicare Other

## 2018-02-28 ENCOUNTER — Encounter (HOSPITAL_COMMUNITY): Payer: Self-pay | Admitting: Emergency Medicine

## 2018-02-28 ENCOUNTER — Other Ambulatory Visit: Payer: Self-pay

## 2018-02-28 DIAGNOSIS — M5412 Radiculopathy, cervical region: Secondary | ICD-10-CM

## 2018-02-28 DIAGNOSIS — Z79899 Other long term (current) drug therapy: Secondary | ICD-10-CM | POA: Insufficient documentation

## 2018-02-28 DIAGNOSIS — M4802 Spinal stenosis, cervical region: Secondary | ICD-10-CM

## 2018-02-28 DIAGNOSIS — M9981 Other biomechanical lesions of cervical region: Secondary | ICD-10-CM | POA: Insufficient documentation

## 2018-02-28 DIAGNOSIS — I10 Essential (primary) hypertension: Secondary | ICD-10-CM | POA: Diagnosis not present

## 2018-02-28 DIAGNOSIS — Z87891 Personal history of nicotine dependence: Secondary | ICD-10-CM | POA: Diagnosis not present

## 2018-02-28 DIAGNOSIS — M542 Cervicalgia: Secondary | ICD-10-CM | POA: Diagnosis not present

## 2018-02-28 DIAGNOSIS — J449 Chronic obstructive pulmonary disease, unspecified: Secondary | ICD-10-CM | POA: Diagnosis not present

## 2018-02-28 LAB — BASIC METABOLIC PANEL
ANION GAP: 13 (ref 5–15)
BUN: 20 mg/dL (ref 8–23)
CHLORIDE: 102 mmol/L (ref 98–111)
CO2: 23 mmol/L (ref 22–32)
Calcium: 9.2 mg/dL (ref 8.9–10.3)
Creatinine, Ser: 1.09 mg/dL (ref 0.61–1.24)
GFR calc non Af Amer: >60 mL/min (ref >60)
Glucose, Bld: 113 mg/dL — ABNORMAL HIGH (ref 70–99)
Potassium: 3.8 mmol/L (ref 3.5–5.1)
Sodium: 138 mmol/L (ref 135–145)

## 2018-02-28 LAB — CBC WITH DIFFERENTIAL/PLATELET
ABS IMMATURE GRANULOCYTES: 0.1 10*3/uL (ref 0.0–0.1)
BASOS ABS: 0 10*3/uL (ref 0.0–0.1)
Basophils Relative: 0 %
EOS ABS: 0 10*3/uL (ref 0.0–0.7)
Eosinophils Relative: 0 %
HEMATOCRIT: 44.7 % (ref 39.0–52.0)
Hemoglobin: 14.7 g/dL (ref 13.0–17.0)
Immature Granulocytes: 0 %
Lymphocytes Relative: 17 %
Lymphs Abs: 2.2 10*3/uL (ref 0.7–4.0)
MCH: 32.1 pg (ref 26.0–34.0)
MCHC: 32.9 g/dL (ref 30.0–36.0)
MCV: 97.6 fL (ref 78.0–100.0)
MONO ABS: 1 10*3/uL (ref 0.1–1.0)
MONOS PCT: 8 %
NEUTROS ABS: 9.5 10*3/uL — AB (ref 1.7–7.7)
Neutrophils Relative %: 75 %
Platelets: 185 10*3/uL (ref 150–400)
RBC: 4.58 MIL/uL (ref 4.22–5.81)
RDW: 14 % (ref 11.5–15.5)
WBC: 12.9 10*3/uL — ABNORMAL HIGH (ref 4.0–10.5)

## 2018-02-28 LAB — I-STAT TROPONIN, ED: Troponin i, poc: 0 ng/mL (ref 0.00–0.08)

## 2018-02-28 MED ORDER — MORPHINE SULFATE (PF) 4 MG/ML IV SOLN
4.0000 mg | Freq: Once | INTRAVENOUS | Status: AC
  Administered 2018-02-28: 4 mg via INTRAVENOUS
  Filled 2018-02-28: qty 1

## 2018-02-28 MED ORDER — HYDROCODONE-ACETAMINOPHEN 5-325 MG PO TABS: 1.0000 | ORAL_TABLET | Freq: Four times a day (QID) | ORAL | 0 refills | Status: DC | PRN

## 2018-02-28 MED ORDER — HYDROCODONE-ACETAMINOPHEN 5-325 MG PO TABS
1.0000 | ORAL_TABLET | Freq: Once | ORAL | Status: AC
  Administered 2018-02-28: 1 via ORAL
  Filled 2018-02-28: qty 1

## 2018-02-28 MED ORDER — GABAPENTIN 300 MG PO CAPS: 300.0000 mg | ORAL_CAPSULE | Freq: Every day | ORAL | 0 refills | Status: DC

## 2018-02-28 MED ORDER — ONDANSETRON HCL 4 MG/2ML IJ SOLN
4.0000 mg | Freq: Once | INTRAMUSCULAR | Status: AC
  Administered 2018-02-28: 4 mg via INTRAVENOUS
  Filled 2018-02-28: qty 2

## 2018-02-28 MED ORDER — LORAZEPAM 1 MG PO TABS
1.0000 mg | ORAL_TABLET | Freq: Once | ORAL | Status: AC
  Administered 2018-02-28: 1 mg via ORAL
  Filled 2018-02-28: qty 1

## 2018-02-28 MED ORDER — HYDROMORPHONE HCL 1 MG/ML IJ SOLN: 0.5000 mg | Freq: Once | INTRAMUSCULAR | Status: DC

## 2018-02-28 MED ORDER — METHYLPREDNISOLONE SODIUM SUCC 125 MG IJ SOLR
60.0000 mg | Freq: Once | INTRAMUSCULAR | Status: AC
  Administered 2018-02-28: 60 mg via INTRAVENOUS
  Filled 2018-02-28: qty 2

## 2018-02-28 MED ORDER — OXYCODONE-ACETAMINOPHEN 5-325 MG PO TABS
1.0000 | ORAL_TABLET | Freq: Once | ORAL | Status: AC
  Administered 2018-02-28: 1 via ORAL
  Filled 2018-02-28: qty 1

## 2018-02-28 MED ORDER — METHYLPREDNISOLONE SODIUM SUCC 125 MG IJ SOLR: 80.0000 mg | Freq: Once | INTRAMUSCULAR | Status: DC

## 2018-02-28 NOTE — ED Provider Notes (Signed)
[facility] EMERGENCY DEPARTMENT Provider Note   CSN: [id number] Arrival date & time: 02/28/18  1202     History   Chief Complaint Chief Complaint  Patient presents with  . Neck Pain    HPI Edwin [name] is a 77 y.o. male.  HPI   Patient is a 77 year old male with a history of COPD, hyperlipidemia, hypertension, allergic rhinitis, presenting for right-sided neck pain.  Patient reports that the symptoms began 3 days ago in the morning, and he said it has progressively gotten worse.  Patient denies any neck trauma, but initially thought he slept wrong resulting in the neck pain.  Patient reports over the past 24 hours, he began having pain radiating down his right chest.  Patient reports that his pain is with movement, and if he can get into the appropriate position, he is able to comfortably rest.  Patient reports he presented to his orthopedic physician, Dr. Debroah Loop office yesterday, and received a steroid Dosepak and tizanidine.  Past Medical History:  Diagnosis Date  . Asthma   . COPD (chronic obstructive pulmonary disease) (HCC)    reactive airway disease  . Glaucoma   . Gout   . Hx of colonic polyps    Dr Earlean Shawl  . Hyperlipidemia    NMR lipoprofile 2005: LDL 113(1416/825), HLD 37, TG 190.  [name] Kitchen Hypertension   . Pneumonia     Patient Active Problem List   Diagnosis Date Noted  . Asthmatic bronchitis with prominent rhinitis 12/12/2017  . NSIP (nonspecific interstitial pneumonia) ([place]) 08/26/2017  . Diverticulitis large intestine w/o perforation or abscess w/bleeding 06/03/2017  . LPRD (laryngopharyngeal reflux disease) 06/06/2015  . COPD with asthma ([place]) 06/06/2015  . COPD GOLD 0 s/p smoking cessation in 1985 02/15/2015  . Routine general medical examination at a health care facility 10/03/2013  . Hyperglycemia 10/03/2013  . Benign prostatic hyperplasia 10/03/2013  . Hyperlipidemia with target LDL less than 130 05/13/2012  . Glaucoma 01/08/2011    . GERD 12/20/2008  . Gout 01/20/2007  . Essential hypertension 01/20/2007    Past Surgical History:  Procedure Laterality Date  . CATARACT EXTRACTION     bilat, implants  . CHOLECYSTECTOMY  1995  . LUMBAR LAMINECTOMY  1982  . POLYPECTOMY  2003    neg 2006, Dr.Medoff  . tear duct surgery  1998   for excess tearing, skin graft L foot @ age 86 1/2        Home Medications    Prior to Admission medications   Medication Sig Start Date End Date Taking? Authorizing Provider  acyclovir (ZOVIRAX) 200 MG capsule Take 1 capsule (200 mg total) by mouth 4 (four) times daily for 10 days. 02/18/18 02/28/18  Kozlow, Donnamarie Poag, MD  allopurinol (ZYLOPRIM) 300 MG tablet TAKE 1 TABLET (300 MG TOTAL) BY MOUTH DAILY. 02/09/18   Janith Lima, MD  atorvastatin (LIPITOR) 20 MG tablet Take 1 tablet (20 mg total) by mouth daily. 10/09/17   Janith Lima, MD  brimonidine (ALPHAGAN P) 0.1 % SOLN 1 drop 2 (two) times daily. Both eyes     [provider]  budesonide-formoterol (SYMBICORT) 80-4.5 MCG/ACT inhaler Inhale 2 puffs into the lungs 2 (two) times daily. 01/08/18   Tanda Rockers, MD  dextromethorphan-guaiFENesin (MUCINEX DM) 30-600 MG 12hr tablet Take 1 tablet by mouth daily as needed for cough.     [provider]  EPINEPHrine 0.3 mg/0.3 mL IJ SOAJ injection Inject 0.3 mLs (0.3 mg total) into  the muscle once. 12/11/15   Kozlow, Donnamarie Poag, MD  famotidine (PEPCID) 20 MG tablet One at bedtime 09/23/17   Tanda Rockers, MD  FLUoxetine (PROZAC) 10 MG capsule TAKE 1 CAPSULE (10 MG TOTAL) BY MOUTH DAILY. 09/25/17   Janith Lima, MD  LUMIGAN 0.01 % SOLN  04/02/17   [provider]  olmesartan-hydrochlorothiazide (BENICAR HCT) 40-12.5 MG tablet Take 0.5 tablets by mouth daily. 06/03/17   Janith Lima, MD  pantoprazole (PROTONIX) 40 MG tablet TAKE 1 TABLET (40 MG TOTAL) BY MOUTH DAILY. TAKE 30-60 MIN BEFORE FIRST MEAL OF THE DAY 01/18/18   Tanda Rockers, MD  predniSONE (DELTASONE) 10 MG  tablet Take  4 each am x 2 days,   2 each am x 2 days,  1 each am x 2 days and stop Patient not taking: Reported on 10/28/2017 09/23/17   Tanda Rockers, MD  psyllium (METAMUCIL) 58.6 % powder Take 1 packet by mouth daily.      [provider]  VENTOLIN HFA 108 (90 Base) MCG/ACT inhaler INHALE 1-2 PUFFS INTO THE LUNGS EVERY 4 HOURS AS NEEDED FOR WHEEZING OR SHORTNESS OF BREATH 11/30/17   Tanda Rockers, MD    Family History Family History  Problem Relation Age of Onset  . Heart attack Father 69       due to asthma flare  . Asthma Father   . Emphysema Father   . Hypertension Mother   . Cancer Mother         ? primary  . Asthma Son        x2    Social History Social History   Tobacco Use  . Smoking status: Former Smoker    Packs/day: 2.00    Years: 25.00    Pack years: 50.00    Types: Cigarettes    Last attempt to quit: 06/03/1983    Years since quitting: 34.7  . Smokeless tobacco: Never Used  . Tobacco comment: up to 2 ppd  Substance Use Topics  . Alcohol use: No    Comment: socially; 6-8 / week  . Drug use: No     Allergies   Ramipril   Review of Systems Review of Systems  Constitutional: Negative for chills and fever.  Gastrointestinal: Negative for nausea and vomiting.  Musculoskeletal: Positive for myalgias and neck pain. Negative for back pain.  Allergic/Immunologic: Negative for immunocompromised state.  Neurological: Negative for weakness and numbness.  All other systems reviewed and are negative.    Physical Exam Updated Vital Signs BP (!) 155/89   Pulse 66   Temp 97.8 F (36.6 C) (Oral)   Resp 15   Ht 5\' 11"  (1.803 m)   Wt 97.5 kg   SpO2 93%   BMI 29.99 kg/m   Physical Exam  Constitutional: He appears well-developed and well-nourished. No distress.  HENT:  Head: Normocephalic and atraumatic.  Mouth/Throat: Oropharynx is clear and moist.  Eyes: Pupils are equal, round, and reactive to light. Conjunctivae and EOM are normal.  Neck:  Normal range of motion. Neck supple.  Cardiovascular: Normal rate, regular rhythm, S1 normal and S2 normal.  No murmur heard. Pulmonary/Chest: Effort normal and breath sounds normal. He has no wheezes. He has no rales.  Abdominal: Soft. He exhibits no distension. There is no tenderness. There is no guarding.  Musculoskeletal: He exhibits no edema or deformity.  PALPATION: No midline but paraspinal musculature tenderness of cervical spine in mid C-spine region. ROM of cervical  spine intact with flexion/extension/lateral flexion/lateral rotation; Patient can laterally rotate cervical spine greater than 45 degrees.  MOTOR: 5/5 strength b/l with resisted shoulder abduction/adduction, biceps flexion (C5/6), biceps extension (C6-C8), wrist flexion, wrist extension (C6-C8), and grip strength (C7-T1) 2+ DTRs in the biceps and triceps SENSORY: Sensation is intact to light touch in:  Superficial radial nerve distribution (dorsal first web space) Median nerve distribution (tip of index finger)   Ulnar nerve distribution (tip of small finger)  Patient moves LEs symmetrically and with good coordination. Patient ambulates symmetrically with no evidence of LE weakness.  Lymphadenopathy:    He has no cervical adenopathy.  Neurological: He is alert.  Cranial nerves grossly intact. Patient moves extremities symmetrically and with good coordination.  Skin: Skin is warm and dry. No rash noted. No erythema.  Psychiatric: He has a normal mood and affect. His behavior is normal. Judgment and thought content normal.  Nursing note and vitals reviewed.    ED Treatments / Results  Labs (all labs ordered are listed, but only abnormal results are displayed) Labs Reviewed  CBC WITH DIFFERENTIAL/PLATELET - Abnormal; Notable for the following components:      Result Value   WBC 12.9 (*)    Neutro Abs 9.5 (*)    All other components within normal limits  BASIC METABOLIC PANEL  I-STAT TROPONIN, ED    EKG EKG  Interpretation  Date/Time:  Sunday February 28 2018 12:57:31 EDT Ventricular Rate:  75 PR Interval:  136 QRS Duration: 86 QT Interval:  386 QTC Calculation: 431 R Axis:   23 Text Interpretation:  Sinus rhythm with marked sinus arrhythmia Otherwise normal ECG No significant change since last tracing Confirmed by Isla Pence [phone]) on 02/28/2018 4:51:26 PM   Radiology Mr Cervical Spine Wo Contrast  Result Date: 02/28/2018 CLINICAL DATA:  Neck pain. EXAM: MRI CERVICAL SPINE WITHOUT CONTRAST TECHNIQUE: Multiplanar, multisequence MR imaging of the cervical spine was performed. No intravenous contrast was administered. COMPARISON:  None. FINDINGS: Alignment: Normal. Vertebrae: No focal marrow lesion. No compression fracture or evidence of discitis osteomyelitis. Cord: Normal caliber and signal. Posterior Fossa, vertebral arteries, paraspinal tissues: Visualized posterior fossa is normal. Vertebral artery flow voids are preserved. No prevertebral effusion. Disc levels: Sagittal imaging of the C1-T3 levels was acquired, with axial imaging from C2-T2. Normal C1-C2 articulation. C2-C3: Normal. C3-C4: Disc space narrowing with large right uncovertebral osteophyte and right-greater-than-left facet hypertrophy. Severe right neural foraminal stenosis. Moderate left foraminal narrowing. No central spinal canal stenosis. C4-C5: Right-greater-than-left facet and uncovertebral hypertrophy. Severe right, moderate left neural foraminal stenosis. No central spinal canal stenosis. C5-C6: Left-greater-than-right facet hypertrophy. Mild bilateral uncovertebral spurring. Moderate bilateral neural foraminal stenosis. No central spinal canal stenosis. C5-C6: Small disc bulge. No spinal canal or neural foraminal stenosis. C7-T1: Normal. T1-T2: Normal. T2-T3 is imaged only the sagittal plane is normal. IMPRESSION: 1. Severe right, moderate left neural foraminal stenosis at C3-4 and C4-5. 2. Moderate bilateral neural  foraminal stenosis at C5-6. 3. Normal alignment.  No spinal canal stenosis. Electronically Signed   By: Ulyses Jarred M.D.   On: 02/28/2018 19:34    Procedures Procedures (including critical care time)  Medications Ordered in ED Medications  oxyCODONE-acetaminophen (PERCOCET/ROXICET) 5-325 MG per tablet 1 tablet (1 tablet Oral Given 02/28/18 1304)  morphine 4 MG/ML injection 4 mg (4 mg Intravenous Given 02/28/18 1720)  ondansetron (ZOFRAN) injection 4 mg (4 mg Intravenous Given 02/28/18 1720)  LORazepam (ATIVAN) tablet 1 mg (1 mg Oral Given 02/28/18 1720)  Initial Impression / Assessment and Plan / ED Course  I have reviewed the triage vital signs and the nursing notes.  Pertinent labs & imaging results that were available during my care of the patient were reviewed by me and considered in my medical decision making (see chart for details).  Clinical Course as of Mar 01 2051  Nancy Fetter Feb 28, 2018  [id number] Reassessed patient.  Patient down at MRI.   [AM]  2024 Spoke with Dr. Arnoldo Morale of neurosurgery, who states that he will try to fit patient in his Tuesday clinic.  He states that gabapentin and pain control are good options in the interim.  Reviewed patient's MRI.  I appreciate his involvement in the care of this patient.   [AM]    Clinical Course User Index [AM] Albesa Seen, PA-C    Patient nontoxic-appearing, afebrile, and not immunocompromised.  Differential diagnosis includes cervical radiculopathy, torticollis, ACS.  Do not suspect discitis, osteomyelitis, or epidural abscess.  Patient is having no red flag symptoms.  Cardiac work-up negative with EKG without ischemic changes and normal troponin.  Patient had slight leukocytosis, but feel this is WBC demargination secondary to his recent steroid doses.  Patient is not having infectious symptoms.  Had the opportunity to discuss patient's case with patient's orthopedic physician, Dr. Percell Miller in ED.  Per neurosurgical consultation,  patient to follow-up next week, and in the interim, we will place him on low-dose gabapentin and Norco.  Appreciate Dr. Debroah Loop involvement.   Return precautions were given for any weakness, numbness, fever or chills, or intractable pain.  Patient and family are in understanding and agree with the plan of care.  This is a shared visit with Dr. Isla Pence. Patient was independently evaluated by this attending physician. Attending physician consulted in evaluation and discharge management.  Final Clinical Impressions(s) / ED Diagnoses   Final diagnoses:  Cervical radiculopathy  Foraminal stenosis of cervical region  Elevated blood pressure reading with diagnosis of hypertension    ED Discharge Orders         Ordered    HYDROcodone-acetaminophen (NORCO/VICODIN) 5-325 MG tablet  Every 6 hours PRN     02/28/18 2115    gabapentin (NEURONTIN) 300 MG capsule  Daily at bedtime     02/28/18 2115           Tamala Julian 02/28/18 2159    Isla Pence, MD 02/28/18 2306

## 2018-02-28 NOTE — Discharge Instructions (Addendum)
Please see the information and instructions below regarding your visit.  Your diagnoses today include:  1. Cervical radiculopathy   2. Foraminal stenosis of cervical region   3. Elevated blood pressure reading with diagnosis of hypertension     Tests performed today include: See side panel of your discharge paperwork for testing performed today. Vital signs are listed at the bottom of these instructions.   MRI shows the changes in your cervical spine that we discussed called foraminal stenosis which is impinging on nerve.  Medications prescribed:    Take any prescribed medications only as prescribed, and any over the counter medications only as directed on the packaging.  You have been prescribed Norco for pain. This is an opioid pain medication. You may take 1-2 pills of this medication every 6 hours as needed for pain. Only take this medication if you need it for breakthrough pain.  Do not combine this medication with Tylenol, as it may increase the risk of liver problems.  Do not combine this medication with alcohol.  Please be advised to avoid driving or operating heavy machinery while taking this medication, as it may make you drowsy or impair judgment.   Please start taking gabapentin, just starting with 300 mg nightly.  This medication can be increased, which I will leave the adjustments to your primary neurosurgical and orthopedic team.  Please resume your home steroid Dosepak.  Please discontinue tizanidine while we are starting on medications that can make you sleepy.  Home care instructions:  Please follow any educational materials contained in this packet.   Follow-up instructions: Please follow-up with Dr. Arnoldo Morale of neurosurgery this coming Tuesday.  He recommends calling his office tomorrow to make the appointment.  Return instructions:  Please return to the Emergency Department if you experience worsening symptoms.  Please return to the emergency department he  develop any weakness or numbness, increasing pain, chest pain or shortness of breath, weakness or numbness in lower extremities, numbness in the groin, loss of bowel or bladder control. Please return if you have any other emergent concerns.  Additional Information:   Your vital signs today were: BP (!) 171/71    Pulse 70    Temp 97.8 F (36.6 C) (Oral)    Resp 15    Ht 5\' 11"  (1.803 m)    Wt 97.5 kg    SpO2 96%    BMI 29.99 kg/m  If your blood pressure (BP) was elevated on multiple readings during this visit above 130 for the top number or above 80 for the bottom number, please have this repeated by your primary care provider within one month. --------------  Thank you for allowing [place] to participate in your care today.

## 2018-02-28 NOTE — ED Triage Notes (Signed)
Pt, stated, neck pain that radiates down his chest. Denies its related to his chest, has a history of COPD

## 2018-02-28 NOTE — ED Notes (Signed)
Patient transported to MRI 

## 2018-02-28 NOTE — ED Triage Notes (Signed)
Pt. Stated, I started having cervical pain and went to see Dr. Noemi Chapel yesterday and gave me 2 shots and Im worse today.

## 2018-03-02 ENCOUNTER — Other Ambulatory Visit: Payer: Self-pay | Admitting: Neurosurgery

## 2018-03-02 DIAGNOSIS — Z6829 Body mass index (BMI) 29.0-29.9, adult: Secondary | ICD-10-CM | POA: Diagnosis not present

## 2018-03-02 DIAGNOSIS — M542 Cervicalgia: Secondary | ICD-10-CM | POA: Diagnosis not present

## 2018-03-02 DIAGNOSIS — M4722 Other spondylosis with radiculopathy, cervical region: Secondary | ICD-10-CM | POA: Diagnosis not present

## 2018-03-02 DIAGNOSIS — M5020 Other cervical disc displacement, unspecified cervical region: Secondary | ICD-10-CM | POA: Diagnosis not present

## 2018-03-02 DIAGNOSIS — M5412 Radiculopathy, cervical region: Secondary | ICD-10-CM | POA: Diagnosis not present

## 2018-03-03 DIAGNOSIS — J4540 Moderate persistent asthma, uncomplicated: Secondary | ICD-10-CM | POA: Diagnosis not present

## 2018-03-04 ENCOUNTER — Ambulatory Visit (INDEPENDENT_AMBULATORY_CARE_PROVIDER_SITE_OTHER): Payer: Medicare Other | Admitting: *Deleted

## 2018-03-04 DIAGNOSIS — J4540 Moderate persistent asthma, uncomplicated: Secondary | ICD-10-CM | POA: Diagnosis not present

## 2018-03-05 ENCOUNTER — Other Ambulatory Visit: Payer: Self-pay | Admitting: Neurosurgery

## 2018-03-05 NOTE — Progress Notes (Signed)
Pre-op instructions provided to pt only. Please complete assessment on DOS. Pt made aware to stop taking Aspirin by MD. Pt made aware to stop taking vitamins, fish oil and herbal medications. Do not take any NSAIDs ie: Ibuprofen, Advil, Naproxen (Aleve), Motrin, BC and Goody Powder. Pt verbalized understanding of all pre-op instructions.

## 2018-03-08 ENCOUNTER — Encounter (HOSPITAL_COMMUNITY): Payer: Self-pay | Admitting: Certified Registered"

## 2018-03-08 ENCOUNTER — Encounter (HOSPITAL_COMMUNITY)
Admission: RE | Disposition: A | Discharge status: Home / Self Care | Payer: Self-pay | Source: Ambulatory Visit | Attending: Neurosurgery

## 2018-03-08 ENCOUNTER — Inpatient Hospital Stay (HOSPITAL_COMMUNITY)
Admission: RE | Admit: 2018-03-08 | Discharge: 2018-03-09 | DRG: 473 | Disposition: A | Discharge status: Home / Self Care | Payer: Medicare Other | Source: Ambulatory Visit | Attending: Neurosurgery | Admitting: Neurosurgery

## 2018-03-08 ENCOUNTER — Inpatient Hospital Stay (HOSPITAL_COMMUNITY): Payer: Medicare Other | Admitting: Certified Registered"

## 2018-03-08 ENCOUNTER — Inpatient Hospital Stay (HOSPITAL_COMMUNITY): Payer: Medicare Other

## 2018-03-08 ENCOUNTER — Other Ambulatory Visit: Payer: Self-pay

## 2018-03-08 DIAGNOSIS — Z79899 Other long term (current) drug therapy: Secondary | ICD-10-CM

## 2018-03-08 DIAGNOSIS — J449 Chronic obstructive pulmonary disease, unspecified: Secondary | ICD-10-CM | POA: Diagnosis present

## 2018-03-08 DIAGNOSIS — I10 Essential (primary) hypertension: Secondary | ICD-10-CM | POA: Diagnosis present

## 2018-03-08 DIAGNOSIS — H409 Unspecified glaucoma: Secondary | ICD-10-CM | POA: Diagnosis not present

## 2018-03-08 DIAGNOSIS — Z87891 Personal history of nicotine dependence: Secondary | ICD-10-CM

## 2018-03-08 DIAGNOSIS — Z981 Arthrodesis status: Secondary | ICD-10-CM | POA: Diagnosis not present

## 2018-03-08 DIAGNOSIS — M50121 Cervical disc disorder at C4-C5 level with radiculopathy: Secondary | ICD-10-CM | POA: Diagnosis not present

## 2018-03-08 DIAGNOSIS — M4722 Other spondylosis with radiculopathy, cervical region: Principal | ICD-10-CM | POA: Diagnosis present

## 2018-03-08 DIAGNOSIS — M50122 Cervical disc disorder at C5-C6 level with radiculopathy: Secondary | ICD-10-CM | POA: Diagnosis not present

## 2018-03-08 DIAGNOSIS — K219 Gastro-esophageal reflux disease without esophagitis: Secondary | ICD-10-CM | POA: Diagnosis not present

## 2018-03-08 DIAGNOSIS — M5020 Other cervical disc displacement, unspecified cervical region: Secondary | ICD-10-CM | POA: Diagnosis present

## 2018-03-08 DIAGNOSIS — Z888 Allergy status to other drugs, medicaments and biological substances status: Secondary | ICD-10-CM

## 2018-03-08 DIAGNOSIS — E785 Hyperlipidemia, unspecified: Secondary | ICD-10-CM | POA: Diagnosis not present

## 2018-03-08 DIAGNOSIS — Z419 Encounter for procedure for purposes other than remedying health state, unspecified: Secondary | ICD-10-CM

## 2018-03-08 DIAGNOSIS — M4802 Spinal stenosis, cervical region: Secondary | ICD-10-CM | POA: Diagnosis not present

## 2018-03-08 HISTORY — PX: ANTERIOR CERVICAL DECOMP/DISCECTOMY FUSION: SHX1161

## 2018-03-08 LAB — TYPE AND SCREEN
ABO/RH(D): O POS
Antibody Screen: NEGATIVE

## 2018-03-08 LAB — ABO/RH: ABO/RH(D): O POS

## 2018-03-08 SURGERY — ANTERIOR CERVICAL DECOMPRESSION/DISCECTOMY FUSION 3 LEVELS
Anesthesia: General | Site: Spine Cervical

## 2018-03-08 MED ORDER — SODIUM CHLORIDE 0.9 % IV SOLN
INTRAVENOUS | Status: DC | PRN
  Administered 2018-03-08: 500 mL

## 2018-03-08 MED ORDER — BUPIVACAINE-EPINEPHRINE (PF) 0.5% -1:200000 IJ SOLN
INTRAMUSCULAR | Status: DC | PRN
  Administered 2018-03-08: 10 mL

## 2018-03-08 MED ORDER — ONDANSETRON HCL 4 MG/2ML IJ SOLN
INTRAMUSCULAR | Status: AC
  Filled 2018-03-08: qty 2

## 2018-03-08 MED ORDER — PROPOFOL 10 MG/ML IV BOLUS
INTRAVENOUS | Status: AC
  Filled 2018-03-08: qty 20

## 2018-03-08 MED ORDER — OLMESARTAN MEDOXOMIL-HCTZ 40-12.5 MG PO TABS: 0.5000 | ORAL_TABLET | Freq: Every day | ORAL | Status: DC

## 2018-03-08 MED ORDER — GABAPENTIN 300 MG PO CAPS
300.0000 mg | ORAL_CAPSULE | Freq: Three times a day (TID) | ORAL | Status: DC
  Administered 2018-03-08: 300 mg via ORAL
  Filled 2018-03-08: qty 1

## 2018-03-08 MED ORDER — CYCLOBENZAPRINE HCL 10 MG PO TABS
10.0000 mg | ORAL_TABLET | Freq: Three times a day (TID) | ORAL | Status: DC | PRN
  Administered 2018-03-08 – 2018-03-09 (×2): 10 mg via ORAL
  Filled 2018-03-08 (×2): qty 1

## 2018-03-08 MED ORDER — FENTANYL CITRATE (PF) 100 MCG/2ML IJ SOLN
INTRAMUSCULAR | Status: DC | PRN
  Administered 2018-03-08: 50 ug via INTRAVENOUS
  Administered 2018-03-08: 100 ug via INTRAVENOUS
  Administered 2018-03-08 (×2): 50 ug via INTRAVENOUS

## 2018-03-08 MED ORDER — OXYCODONE-ACETAMINOPHEN 5-325 MG PO TABS: 1.0000 | ORAL_TABLET | ORAL | Status: DC | PRN

## 2018-03-08 MED ORDER — CEFAZOLIN SODIUM-DEXTROSE 2-4 GM/100ML-% IV SOLN
2.0000 g | INTRAVENOUS | Status: AC
  Administered 2018-03-08: 2 g via INTRAVENOUS

## 2018-03-08 MED ORDER — SUGAMMADEX SODIUM 200 MG/2ML IV SOLN
INTRAVENOUS | Status: DC | PRN
  Administered 2018-03-08: 200 mg via INTRAVENOUS

## 2018-03-08 MED ORDER — DEXAMETHASONE SODIUM PHOSPHATE 10 MG/ML IJ SOLN
INTRAMUSCULAR | Status: DC | PRN
  Administered 2018-03-08: 10 mg via INTRAVENOUS

## 2018-03-08 MED ORDER — SODIUM CHLORIDE 0.9 % IV SOLN
INTRAVENOUS | Status: DC | PRN
  Administered 2018-03-08: 40 ug/min via INTRAVENOUS

## 2018-03-08 MED ORDER — ONDANSETRON HCL 4 MG/2ML IJ SOLN
INTRAMUSCULAR | Status: DC | PRN
  Administered 2018-03-08: 4 mg via INTRAVENOUS

## 2018-03-08 MED ORDER — ALLOPURINOL 300 MG PO TABS
300.0000 mg | ORAL_TABLET | Freq: Every day | ORAL | Status: DC
  Filled 2018-03-08: qty 1

## 2018-03-08 MED ORDER — ALBUTEROL SULFATE (2.5 MG/3ML) 0.083% IN NEBU: 2.5000 mg | INHALATION_SOLUTION | RESPIRATORY_TRACT | Status: DC | PRN

## 2018-03-08 MED ORDER — ALUM & MAG HYDROXIDE-SIMETH 200-200-20 MG/5ML PO SUSP: 30.0000 mL | Freq: Four times a day (QID) | ORAL | Status: DC | PRN

## 2018-03-08 MED ORDER — EPHEDRINE SULFATE-NACL 50-0.9 MG/10ML-% IV SOSY
PREFILLED_SYRINGE | INTRAVENOUS | Status: DC | PRN
  Administered 2018-03-08: 5 mg via INTRAVENOUS

## 2018-03-08 MED ORDER — FENTANYL CITRATE (PF) 100 MCG/2ML IJ SOLN
25.0000 ug | Freq: Once | INTRAMUSCULAR | Status: AC
  Administered 2018-03-08: 25 ug via INTRAVENOUS

## 2018-03-08 MED ORDER — FAMOTIDINE 20 MG PO TABS
20.0000 mg | ORAL_TABLET | Freq: Every day | ORAL | Status: DC
  Administered 2018-03-08: 20 mg via ORAL
  Filled 2018-03-08: qty 1

## 2018-03-08 MED ORDER — LIDOCAINE 2% (20 MG/ML) 5 ML SYRINGE
INTRAMUSCULAR | Status: AC
  Filled 2018-03-08: qty 5

## 2018-03-08 MED ORDER — ACETAMINOPHEN 10 MG/ML IV SOLN: 1000.0000 mg | Freq: Once | INTRAVENOUS | Status: DC | PRN

## 2018-03-08 MED ORDER — ATORVASTATIN CALCIUM 20 MG PO TABS: 20.0000 mg | ORAL_TABLET | Freq: Every day | ORAL | Status: DC

## 2018-03-08 MED ORDER — PHENOL 1.4 % MT LIQD: 1.0000 | OROMUCOSAL | Status: DC | PRN

## 2018-03-08 MED ORDER — THROMBIN 5000 UNITS EX SOLR
CUTANEOUS | Status: AC
  Filled 2018-03-08: qty 5000

## 2018-03-08 MED ORDER — BACITRACIN ZINC 500 UNIT/GM EX OINT
TOPICAL_OINTMENT | CUTANEOUS | Status: DC | PRN
  Administered 2018-03-08: 1 via TOPICAL

## 2018-03-08 MED ORDER — PROMETHAZINE HCL 25 MG/ML IJ SOLN: 6.2500 mg | INTRAMUSCULAR | Status: DC | PRN

## 2018-03-08 MED ORDER — LIDOCAINE 2% (20 MG/ML) 5 ML SYRINGE
INTRAMUSCULAR | Status: DC | PRN
  Administered 2018-03-08: 80 mg via INTRAVENOUS

## 2018-03-08 MED ORDER — LATANOPROST 0.005 % OP SOLN
1.0000 [drp] | Freq: Every day | OPHTHALMIC | Status: DC
  Filled 2018-03-08: qty 2.5

## 2018-03-08 MED ORDER — MORPHINE SULFATE (PF) 4 MG/ML IV SOLN
4.0000 mg | INTRAVENOUS | Status: DC | PRN
  Administered 2018-03-08: 4 mg via INTRAVENOUS
  Filled 2018-03-08: qty 1

## 2018-03-08 MED ORDER — HYDROMORPHONE HCL 1 MG/ML IJ SOLN: 0.2500 mg | INTRAMUSCULAR | Status: DC | PRN

## 2018-03-08 MED ORDER — 0.9 % SODIUM CHLORIDE (POUR BTL) OPTIME
TOPICAL | Status: DC | PRN
  Administered 2018-03-08: 1000 mL

## 2018-03-08 MED ORDER — DEXAMETHASONE SODIUM PHOSPHATE 4 MG/ML IJ SOLN
4.0000 mg | Freq: Four times a day (QID) | INTRAMUSCULAR | Status: AC
  Administered 2018-03-08 – 2018-03-09 (×2): 4 mg via INTRAVENOUS
  Filled 2018-03-08 (×2): qty 1

## 2018-03-08 MED ORDER — ACETAMINOPHEN 650 MG RE SUPP: 650.0000 mg | RECTAL | Status: DC | PRN

## 2018-03-08 MED ORDER — PANTOPRAZOLE SODIUM 40 MG PO TBEC: 40.0000 mg | DELAYED_RELEASE_TABLET | Freq: Every day | ORAL | Status: DC

## 2018-03-08 MED ORDER — OXYCODONE HCL 5 MG PO TABS: 5.0000 mg | ORAL_TABLET | ORAL | Status: DC | PRN

## 2018-03-08 MED ORDER — HYDROCODONE-ACETAMINOPHEN 5-325 MG PO TABS: 1.0000 | ORAL_TABLET | ORAL | Status: DC | PRN

## 2018-03-08 MED ORDER — DM-GUAIFENESIN ER 30-600 MG PO TB12
1.0000 | ORAL_TABLET | Freq: Two times a day (BID) | ORAL | Status: DC | PRN
  Filled 2018-03-08: qty 1

## 2018-03-08 MED ORDER — MOMETASONE FURO-FORMOTEROL FUM 100-5 MCG/ACT IN AERO
2.0000 | INHALATION_SPRAY | Freq: Two times a day (BID) | RESPIRATORY_TRACT | Status: DC
  Filled 2018-03-08: qty 8.8

## 2018-03-08 MED ORDER — MENTHOL 3 MG MT LOZG
1.0000 | LOZENGE | OROMUCOSAL | Status: DC | PRN
  Filled 2018-03-08: qty 9

## 2018-03-08 MED ORDER — FENTANYL CITRATE (PF) 250 MCG/5ML IJ SOLN
INTRAMUSCULAR | Status: AC
  Filled 2018-03-08: qty 5

## 2018-03-08 MED ORDER — ACETAMINOPHEN 500 MG PO TABS
1000.0000 mg | ORAL_TABLET | Freq: Four times a day (QID) | ORAL | Status: DC
  Administered 2018-03-08 – 2018-03-09 (×2): 1000 mg via ORAL
  Filled 2018-03-08 (×2): qty 2

## 2018-03-08 MED ORDER — DOCUSATE SODIUM 100 MG PO CAPS
100.0000 mg | ORAL_CAPSULE | Freq: Two times a day (BID) | ORAL | Status: DC
  Administered 2018-03-08: 100 mg via ORAL
  Filled 2018-03-08: qty 1

## 2018-03-08 MED ORDER — FENTANYL CITRATE (PF) 100 MCG/2ML IJ SOLN
INTRAMUSCULAR | Status: AC
  Administered 2018-03-08: 25 ug via INTRAVENOUS
  Filled 2018-03-08: qty 2

## 2018-03-08 MED ORDER — BACITRACIN ZINC 500 UNIT/GM EX OINT
TOPICAL_OINTMENT | CUTANEOUS | Status: AC
  Filled 2018-03-08: qty 28.35

## 2018-03-08 MED ORDER — DEXAMETHASONE 4 MG PO TABS: 4.0000 mg | ORAL_TABLET | Freq: Four times a day (QID) | ORAL | Status: AC

## 2018-03-08 MED ORDER — PANTOPRAZOLE SODIUM 20 MG PO TBEC
20.0000 mg | DELAYED_RELEASE_TABLET | Freq: Two times a day (BID) | ORAL | Status: DC
  Administered 2018-03-08: 20 mg via ORAL
  Filled 2018-03-08: qty 1

## 2018-03-08 MED ORDER — FLUOXETINE HCL 10 MG PO CAPS
10.0000 mg | ORAL_CAPSULE | Freq: Every day | ORAL | Status: DC
  Filled 2018-03-08: qty 1

## 2018-03-08 MED ORDER — ROCURONIUM BROMIDE 50 MG/5ML IV SOSY
PREFILLED_SYRINGE | INTRAVENOUS | Status: AC
  Filled 2018-03-08: qty 25

## 2018-03-08 MED ORDER — ROCURONIUM BROMIDE 10 MG/ML (PF) SYRINGE
PREFILLED_SYRINGE | INTRAVENOUS | Status: DC | PRN
  Administered 2018-03-08: 50 mg via INTRAVENOUS
  Administered 2018-03-08: 20 mg via INTRAVENOUS
  Administered 2018-03-08: 10 mg via INTRAVENOUS

## 2018-03-08 MED ORDER — ONDANSETRON HCL 4 MG/2ML IJ SOLN: 4.0000 mg | Freq: Four times a day (QID) | INTRAMUSCULAR | Status: DC | PRN

## 2018-03-08 MED ORDER — CHLORHEXIDINE GLUCONATE CLOTH 2 % EX PADS: 6.0000 | MEDICATED_PAD | Freq: Once | CUTANEOUS | Status: DC

## 2018-03-08 MED ORDER — PHENYLEPHRINE 40 MCG/ML (10ML) SYRINGE FOR IV PUSH (FOR BLOOD PRESSURE SUPPORT)
PREFILLED_SYRINGE | INTRAVENOUS | Status: DC | PRN
  Administered 2018-03-08: 80 ug via INTRAVENOUS

## 2018-03-08 MED ORDER — IRBESARTAN 150 MG PO TABS
150.0000 mg | ORAL_TABLET | Freq: Every day | ORAL | Status: DC
  Filled 2018-03-08: qty 1

## 2018-03-08 MED ORDER — DEXAMETHASONE SODIUM PHOSPHATE 10 MG/ML IJ SOLN
INTRAMUSCULAR | Status: AC
  Filled 2018-03-08: qty 1

## 2018-03-08 MED ORDER — ACETAMINOPHEN 325 MG PO TABS: 650.0000 mg | ORAL_TABLET | ORAL | Status: DC | PRN

## 2018-03-08 MED ORDER — BRIMONIDINE TARTRATE 0.15 % OP SOLN
1.0000 [drp] | Freq: Three times a day (TID) | OPHTHALMIC | Status: DC
  Filled 2018-03-08: qty 5

## 2018-03-08 MED ORDER — HYDROCHLOROTHIAZIDE 10 MG/ML ORAL SUSPENSION
6.2500 mg | Freq: Every day | ORAL | Status: DC
  Filled 2018-03-08: qty 1.25

## 2018-03-08 MED ORDER — OXYCODONE HCL 5 MG PO TABS
10.0000 mg | ORAL_TABLET | ORAL | Status: DC | PRN
  Administered 2018-03-08 – 2018-03-09 (×3): 10 mg via ORAL
  Filled 2018-03-08 (×3): qty 2

## 2018-03-08 MED ORDER — CEFAZOLIN SODIUM-DEXTROSE 2-4 GM/100ML-% IV SOLN: 2.0000 g | INTRAVENOUS | Status: DC

## 2018-03-08 MED ORDER — LACTATED RINGERS IV SOLN
INTRAVENOUS | Status: DC
  Administered 2018-03-08 (×2): via INTRAVENOUS

## 2018-03-08 MED ORDER — BISACODYL 10 MG RE SUPP: 10.0000 mg | Freq: Every day | RECTAL | Status: DC | PRN

## 2018-03-08 MED ORDER — CEFAZOLIN SODIUM-DEXTROSE 2-4 GM/100ML-% IV SOLN
2.0000 g | Freq: Three times a day (TID) | INTRAVENOUS | Status: DC
  Administered 2018-03-09: 2 g via INTRAVENOUS
  Filled 2018-03-08: qty 100

## 2018-03-08 MED ORDER — CEFAZOLIN SODIUM-DEXTROSE 2-4 GM/100ML-% IV SOLN
INTRAVENOUS | Status: AC
  Filled 2018-03-08: qty 100

## 2018-03-08 MED ORDER — MEPERIDINE HCL 50 MG/ML IJ SOLN: 6.2500 mg | INTRAMUSCULAR | Status: DC | PRN

## 2018-03-08 MED ORDER — ONDANSETRON HCL 4 MG PO TABS: 4.0000 mg | ORAL_TABLET | Freq: Four times a day (QID) | ORAL | Status: DC | PRN

## 2018-03-08 MED ORDER — THROMBIN 5000 UNITS EX SOLR
OROMUCOSAL | Status: DC | PRN
  Administered 2018-03-08: 5 mL via TOPICAL

## 2018-03-08 MED ORDER — PROPOFOL 10 MG/ML IV BOLUS
INTRAVENOUS | Status: DC | PRN
  Administered 2018-03-08: 150 mg via INTRAVENOUS

## 2018-03-08 MED ORDER — LACTATED RINGERS IV SOLN: INTRAVENOUS | Status: DC

## 2018-03-08 SURGICAL SUPPLY — 68 items
APL SKNCLS STERI-STRIP NONHPOA (GAUZE/BANDAGES/DRESSINGS) ×1
BAG DECANTER FOR FLEXI CONT (MISCELLANEOUS) ×2 IMPLANT
BENZOIN TINCTURE PRP APPL 2/3 (GAUZE/BANDAGES/DRESSINGS) ×2 IMPLANT
BIT DRILL NEURO 2X3.1 SFT TUCH (MISCELLANEOUS) ×1 IMPLANT
BLADE SURG 15 STRL LF DISP TIS (BLADE) ×1 IMPLANT
BLADE SURG 15 STRL SS (BLADE) ×6
BLADE ULTRA TIP 2M (BLADE) ×2 IMPLANT
BUR BARREL STRAIGHT FLUTE 4.0 (BURR) ×2 IMPLANT
BUR MATCHSTICK NEURO 3.0 LAGG (BURR) ×2 IMPLANT
CANISTER SUCT 3000ML PPV (MISCELLANEOUS) ×2 IMPLANT
CARTRIDGE OIL MAESTRO DRILL (MISCELLANEOUS) ×1 IMPLANT
COVER MAYO STAND STRL (DRAPES) ×2 IMPLANT
COVER WAND RF STERILE (DRAPES) IMPLANT
DIFFUSER DRILL AIR PNEUMATIC (MISCELLANEOUS) ×2 IMPLANT
DRAIN JACKSON PRATT 10MM FLAT (MISCELLANEOUS) ×1 IMPLANT
DRAPE LAPAROTOMY 100X72 PEDS (DRAPES) ×2 IMPLANT
DRAPE MICROSCOPE LEICA (MISCELLANEOUS) IMPLANT
DRAPE POUCH INSTRU U-SHP 10X18 (DRAPES) IMPLANT
DRAPE SURG 17X23 STRL (DRAPES) ×4 IMPLANT
DRILL NEURO 2X3.1 SOFT TOUCH (MISCELLANEOUS) ×2
DRSG OPSITE POSTOP 4X6 (GAUZE/BANDAGES/DRESSINGS) ×1 IMPLANT
ELECT BLADE 4.0 EZ CLEAN MEGAD (MISCELLANEOUS) ×2
ELECT REM PT RETURN 9FT ADLT (ELECTROSURGICAL) ×2
ELECTRODE BLDE 4.0 EZ CLN MEGD (MISCELLANEOUS) IMPLANT
ELECTRODE REM PT RTRN 9FT ADLT (ELECTROSURGICAL) ×1 IMPLANT
EVACUATOR SILICONE 100CC (DRAIN) ×1 IMPLANT
GAUZE 4X4 16PLY RFD (DISPOSABLE) IMPLANT
GAUZE SPONGE 4X4 12PLY STRL (GAUZE/BANDAGES/DRESSINGS) ×1 IMPLANT
GLOVE BIO SURGEON STRL SZ 6.5 (GLOVE) ×3 IMPLANT
GLOVE BIO SURGEON STRL SZ7 (GLOVE) ×2 IMPLANT
GLOVE BIO SURGEON STRL SZ8 (GLOVE) ×2 IMPLANT
GLOVE BIO SURGEON STRL SZ8.5 (GLOVE) ×2 IMPLANT
GLOVE BIOGEL PI IND STRL 6.5 (GLOVE) ×2 IMPLANT
GLOVE BIOGEL PI IND STRL 7.5 (GLOVE) IMPLANT
GLOVE BIOGEL PI INDICATOR 6.5 (GLOVE) ×2
GLOVE BIOGEL PI INDICATOR 7.5 (GLOVE) ×1
GLOVE EXAM NITRILE LRG STRL (GLOVE) IMPLANT
GLOVE EXAM NITRILE XL STR (GLOVE) IMPLANT
GLOVE EXAM NITRILE XS STR PU (GLOVE) IMPLANT
GOWN STRL REUS W/ TWL LRG LVL3 (GOWN DISPOSABLE) IMPLANT
GOWN STRL REUS W/ TWL XL LVL3 (GOWN DISPOSABLE) ×1 IMPLANT
GOWN STRL REUS W/TWL LRG LVL3 (GOWN DISPOSABLE) ×6
GOWN STRL REUS W/TWL XL LVL3 (GOWN DISPOSABLE) ×2
HEMOSTAT POWDER KIT SURGIFOAM (HEMOSTASIS) ×2 IMPLANT
INTERBODY TM 14X14X7-0DEG PAR (Metal Cage) ×2 IMPLANT
KIT BASIN OR (CUSTOM PROCEDURE TRAY) ×2 IMPLANT
KIT TURNOVER KIT B (KITS) ×2 IMPLANT
MARKER SKIN DUAL TIP RULER LAB (MISCELLANEOUS) ×2 IMPLANT
NEEDLE HYPO 22GX1.5 SAFETY (NEEDLE) ×2 IMPLANT
NEEDLE SPNL 18GX3.5 QUINCKE PK (NEEDLE) ×2 IMPLANT
NS IRRIG 1000ML POUR BTL (IV SOLUTION) ×2 IMPLANT
OIL CARTRIDGE MAESTRO DRILL (MISCELLANEOUS) ×2
PACK LAMINECTOMY NEURO (CUSTOM PROCEDURE TRAY) ×2 IMPLANT
PEEK VISTA 14X14X7MM (Peek) ×4 IMPLANT
PIN DISTRACTION 14MM (PIN) ×4 IMPLANT
PLATE ANT CERV XTEND 3 LV 48 (Plate) ×1 IMPLANT
PUTTY KINEX BIOACTIVE 5CC (Bone Implant) ×2 IMPLANT
RUBBERBAND STERILE (MISCELLANEOUS) IMPLANT
SCREW XTD VAR 4.2 SELF TAP (Screw) ×8 IMPLANT
SPONGE INTESTINAL PEANUT (DISPOSABLE) ×4 IMPLANT
SPONGE SURGIFOAM ABS GEL 100 (HEMOSTASIS) IMPLANT
STRIP CLOSURE SKIN 1/2X4 (GAUZE/BANDAGES/DRESSINGS) ×2 IMPLANT
SUT VIC AB 0 CT1 27 (SUTURE) ×2
SUT VIC AB 0 CT1 27XBRD ANTBC (SUTURE) ×1 IMPLANT
SUT VIC AB 3-0 SH 8-18 (SUTURE) ×2 IMPLANT
TOWEL GREEN STERILE (TOWEL DISPOSABLE) ×2 IMPLANT
TOWEL GREEN STERILE FF (TOWEL DISPOSABLE) ×1 IMPLANT
WATER STERILE IRR 1000ML POUR (IV SOLUTION) ×2 IMPLANT

## 2018-03-08 NOTE — Anesthesia Procedure Notes (Signed)
Procedure Name: Intubation Date/Time: 03/08/2018 5:55 PM Performed by: Cleda Daub, CRNA Pre-anesthesia Checklist: Patient identified, Emergency Drugs available, Suction available and Patient being monitored Patient Re-evaluated:Patient Re-evaluated prior to induction Oxygen Delivery Method: Circle system utilized Preoxygenation: Pre-oxygenation with 100% oxygen Induction Type: IV induction Ventilation: Mask ventilation without difficulty and Mask ventilation throughout procedure Laryngoscope Size: Glidescope and 4 Grade View: Grade I Tube type: Oral Tube size: 8.0 mm Number of attempts: 1 Airway Equipment and Method: Stylet and Video-laryngoscopy Placement Confirmation: ETT inserted through vocal cords under direct vision,  positive ETCO2 and breath sounds checked- equal and bilateral Secured at: 24 cm Tube secured with: Tape Dental Injury: Teeth and Oropharynx as per pre-operative assessment

## 2018-03-08 NOTE — Transfer of Care (Signed)
Immediate Anesthesia Transfer of Care Note  Patient: Edwin [name]  Procedure(s) Performed: ANTERIOR CERVICAL DECOMPRESSION/DISCECTOMY FUSION, INTERBODY PROSTHESIS,PLATE/SCREWS CERVICAL FOUR- CERVICAL FIVE, CERVICAL FIVE- CERVICAL SIX, CERVICAL SIX- CERVICAL SEVEN (N/A Spine Cervical)  Patient Location: PACU  Anesthesia Type:General  Level of Consciousness: awake, alert  and oriented  Airway & Oxygen Therapy: Patient connected to nasal cannula oxygen  Post-op Assessment: Report given to RN, Post -op Vital signs reviewed and stable and Patient moving all extremities X 4  Post vital signs: Reviewed and stable  Last Vitals:  Vitals Value Taken Time  BP 163/73 03/08/2018  9:24 PM  Temp 36.5 C 03/08/2018  9:26 PM  Pulse 97 03/08/2018  9:29 PM  Resp 18 03/08/2018  9:29 PM  SpO2 91 % 03/08/2018  9:29 PM  Vitals shown include unvalidated device data.  Last Pain:  Vitals:   03/08/18 2126  TempSrc:   PainSc: 0-No pain      Patients Stated Pain Goal: 1 (00/51/10 2111)  Complications: No apparent anesthesia complications

## 2018-03-08 NOTE — Progress Notes (Signed)
Subjective: The patient is somnolent but easily arousable.  He looks well.  He is in no apparent distress.  Objective: Vital signs in last 24 hours: Temp:  [97.7 F (36.5 C)-98 F (36.7 C)] 97.7 F (36.5 C) (10/07 2126) Pulse Rate:  [72-85] 85 (10/07 2124) Resp:  [18] 18 (10/07 1511) BP: (163-179)/(73-99) 163/73 (10/07 2124) SpO2:  [94 %] 94 % (10/07 1511) Weight:  [96.2 kg] 96.2 kg (10/07 1511) Estimated body mass index is 29.57 kg/m as calculated from the following:   Height as of this encounter: 5\' 11"  (1.803 m).   Weight as of this encounter: 96.2 kg.   Intake/Output from previous day: No intake/output data recorded. Intake/Output this shift: Total I/O In: 1200 [I.V.:1200] Out: 200 [Blood:200]  Physical exam the patient is somnolent but easily arousable.  He is moving all 4 extremities.  There is no weakness.  His dressing is clean and dry.  There is no hematoma or shift.  Lab Results: No results for input(s): WBC, HGB, HCT, PLT in the last 72 hours. BMET No results for input(s): NA, K, CL, CO2, GLUCOSE, BUN, CREATININE, CALCIUM in the last 72 hours.  Studies/Results: No results found.  Assessment/Plan: The patient is doing well.  I spoke with his family.  LOS: 0 days     Ophelia Charter 03/08/2018, 9:27 PM

## 2018-03-08 NOTE — Op Note (Signed)
Brief history: The patient is a 77 year old white male who has complained of neck and severe right arm pain consistent with a cervical radiculopathy.  He has failed medical management and was worked up with a cervical MRI which demonstrated herniated disc, spondylosis at C4-5, C5-6 and C6-7.  I discussed the various treatment options with the patient.  He has decided proceed with surgery after weighing the risks, benefits and alternatives.  Preoperative diagnosis: C4-5, C5-6 and C6-7 spondylosis, herniated disc, stenosis, cervicalgia, cervical radiculopathy  Postoperative diagnosis: The same  Procedure: C4-5, C5-6 and C6-7 anterior cervical discectomy/decompression; C4-5, C5-6 and C6-7 interbody arthrodesis with local morcellized autograft bone and Kinnex bone graft extender; insertion of interbody prosthesis at C4-5, C5-6 and C6-7 (Zimmer peek interbody prosthesis); anterior cervical plating from C4-C7 with globus titanium plate  Surgeon: Dr. Earle Gell  Asst.: Arnetha Massy, nurse practitioner  Anesthesia: Gen. endotracheal  Estimated blood loss: 150 cc  Drains: one 10 mm flat Jackson-Pratt drain in the prevertebral space  Complications: None  Description of procedure: The patient was brought to the operating room by the anesthesia team. General endotracheal anesthesia was induced. A roll was placed under the patient's shoulders to keep the neck in the neutral position. The patient's anterior cervical region was then prepared with Betadine scrub and Betadine solution. Sterile drapes were applied.  The area to be incised was then injected with Marcaine with epinephrine solution. I then used a scalpel to make a transverse incision in the patient's left anterior neck. I used the Metzenbaum scissors to divide the platysmal muscle and then to dissect medial to the sternocleidomastoid muscle, jugular vein, and carotid artery. I carefully dissected down towards the anterior cervical spine  identifying the esophagus and retracting it medially. Then using Kitner swabs to clear soft tissue from the anterior cervical spine. We then inserted a bent spinal needle into the upper exposed intervertebral disc space. We then obtained intraoperative radiographs confirm our location.  I then used electrocautery to detach the medial border of the longus colli muscle bilaterally from the C4-5, C5-6 and C6-7 intervertebral disc spaces. I then inserted the Caspar self-retaining retractor underneath the longus colli muscle bilaterally to provide exposure.  We then incised the intervertebral disc at C4-5. We then performed a partial intervertebral discectomy with a pituitary forceps and the Karlin curettes. I then inserted distraction screws into the vertebral bodies at C4-5. We then distracted the interspace. We then used the high-speed drill to decorticate the vertebral endplates at W1-0, to drill away the remainder of the intervertebral disc, to drill away some posterior spondylosis, and to thin out the posterior longitudinal ligament. I then incised ligament with the arachnoid knife. We then removed the ligament with a Kerrison punches undercutting the vertebral endplates and decompressing the thecal sac. We then performed foraminotomies about the bilateral C5 nerve roots. This completed the decompression at this level.  We then repeated this procedure in analogous fashion at C5-6 and C6-7 decompressing the thecal sac and the bilateral C6 and C7 nerve roots.  We now turned our to attention to the interbody fusion. We used the trial spacers to determine the appropriate size for the interbody prosthesis. We then pre-filled prosthesis with a combination of local morcellized autograft bone that we obtained during decompression as well as Kinnex bone graft extender. We then inserted the prosthesis into the distracted interspace at C4-5, C5-6 and C6-7. We then removed the distraction screws. There was a good snug  fit of the prosthesis in  the interspace.  Having completed the fusion we now turned attention to the anterior spinal instrumentation. We used the high-speed drill to drill away some anterior spondylosis at the disc spaces so that the plate lay down flat. We selected the appropriate length titanium anterior cervical plate. We laid it along the anterior aspect of the vertebral bodies from C4-C7. We then drilled 14 mm holes at C4, C5, C6 and C7. We then secured the plate to the vertebral bodies by placing two 14 mm self-tapping screws at C4, C5, C6 and C7. We then obtained intraoperative radiograph. The demonstrating good position of the instrumentation. We therefore secured the screws the plate the locking each cam. This completed the instrumentation.  We then obtained hemostasis using bipolar electrocautery. We irrigated the wound out with bacitracin solution. We then removed the retractor. We inspected the esophagus for any damage. There was none apparent.  We placed a 10 mm flat Hemovac drain in the prevertebral space and tunneled it out through a separate stab wound.  We then reapproximated patient's platysmal muscle with interrupted 3-0 Vicryl suture. We then reapproximated the subcutaneous tissue with interrupted 3-0 Vicryl suture. The skin was reapproximated with Steri-Strips and benzoin. The wound was then covered with bacitracin ointment. A sterile dressing was applied. The drapes were removed. Patient was subsequently extubated by the anesthesia team and transported to the post anesthesia care unit in stable condition. All sponge instrument and needle counts were reportedly correct at the end of this case.

## 2018-03-08 NOTE — Anesthesia Postprocedure Evaluation (Signed)
Anesthesia Post Note  Patient: Edwin [name]  Procedure(s) Performed: ANTERIOR CERVICAL DECOMPRESSION/DISCECTOMY FUSION, INTERBODY PROSTHESIS,PLATE/SCREWS CERVICAL FOUR- CERVICAL FIVE, CERVICAL FIVE- CERVICAL SIX, CERVICAL SIX- CERVICAL SEVEN (N/A Spine Cervical)     Patient location during evaluation: PACU Anesthesia Type: General Level of consciousness: awake and alert Pain management: pain level controlled Vital Signs Assessment: post-procedure vital signs reviewed and stable Respiratory status: spontaneous breathing, nonlabored ventilation and respiratory function stable Cardiovascular status: blood pressure returned to baseline and stable Postop Assessment: no apparent nausea or vomiting Anesthetic complications: no    Last Vitals:  Vitals:   03/08/18 2150 03/08/18 2206  BP:  (!) 170/85  Pulse: (!) 106 89  Resp: 20 18  Temp: 36.5 C 36.8 C  SpO2: 95% 94%    Last Pain:  Vitals:   03/08/18 2206  TempSrc: Oral  PainSc:                  Ralph Brouwer,W. EDMOND

## 2018-03-08 NOTE — Anesthesia Preprocedure Evaluation (Signed)
Anesthesia Evaluation  Patient identified by MRN, date of birth, ID band Patient awake    Reviewed: Allergy & Precautions, NPO status , Patient's Chart, lab work & pertinent test results  Airway Mallampati: II       Dental no notable dental hx. (+) Teeth Intact   Pulmonary asthma , COPD,  COPD inhaler, former smoker,    Pulmonary exam normal breath sounds clear to auscultation       Cardiovascular hypertension, Pt. on medications Normal cardiovascular exam Rhythm:Regular Rate:Normal     Neuro/Psych negative psych ROS   GI/Hepatic Neg liver ROS, GERD  Medicated,  Endo/Other  negative endocrine ROS  Renal/GU negative Renal ROS  negative genitourinary   Musculoskeletal  (+) Arthritis ,   Abdominal Normal abdominal exam  (+) - obese,   Peds  Hematology negative hematology ROS (+)   Anesthesia Other Findings Edwin [name]  ECHO COMPLETE WO IMAGING ENHANCING AGENT  Order# [id number]  Reading physician: Pixie Casino, MD Ordering physician: Lily Kocher, MD Study date: 01/08/16 Study Result   Result status: Final result                             *[facility]*                         [address], [place] [address]                            [phone]  ------------------------------------------------------------------- Transthoracic Echocardiography  Patient:    Edwin [name], Edwin [name] MR #:       [id number] Study Date: 01/08/2016 Gender:     M Age:        77 Height:     179.1 cm Weight:     97.6 kg BSA:        2.23 m^2 Pt. Status: Room:       6E01C   ATTENDING    Veryl Speak [id number]  PERFORMING   [facility], Inpatient  ADMITTING    [place], [place], [place]    [place], [place]  SONOGRAPHER  Mikki Santee  cc:  ------------------------------------------------------------------- LV EF: 60% -   65%  ------------------------------------------------------------------- Indications:      Dyspnea 786.09.  ------------------------------------------------------------------- History:   PMH:   Chronic obstructive pulmonary disease.  Risk factors:  Hypertension. Dyslipidemia.  ------------------------------------------------------------------- Study Conclusions  - Left ventricle: The cavity size was normal. Wall thickness was   increased in a pattern of mild LVH. Systolic function was normal.   The estimated ejection fraction was in the range of 60% to 65%.   Wall motion was normal; there were no regional wall motion   abnormalities. Doppler parameters are consistent with abnormal   left ventricular relaxation (grade 1 diastolic dysfunction). The   E/e&' ratio is between 8-15, suggesting indeterminate LV filling   pressure. - Left atrium: The atrium was normal in size. - Tricuspid valve: There was trivial regurgitation. - Pulmonary arteries: PA peak pressure: 38 mm Hg (S). - Inferior vena cava: The vessel was normal  in size. The   respirophasic diameter changes were in the normal range (>= 50%),   consistent with normal central venous pressure.  Impressions:  - LVEF 60-65%, mild LVH, normal wall motion, diastolic dysfunction,   indeterminate LV filling pressure, normal LA size, trivial TR,   RVSP 38 mmHg, normal IVC.  ------------------------------------------------------------------- Study data:  No prior study was available for comparison.  Study status:  Routine.  Procedure:  The patient reported no pain pre or post test. Transthoracic echocardiography. Image quality was adequate.  Study completion:  There were no complications. Transthoracic echocardiography.  M-mode, complete 2D, spectral Doppler, and color Doppler.  Birthdate:  Patient birthdate: 07-28-1940.   Age:  Patient is 77 yr old.  Sex:  Gender: male. BMI: 30.4 kg/m^2.  Blood pressure:     154/83  Patient status: Inpatient.  Study date:  Study date: 01/08/2016. Study time: 08:39 AM.  Location:  Echo laboratory.  -------------------------------------------------------------------  ------------------------------------------------------------------- Left ventricle:  The cavity size was normal. Wall thickness was increased in a pattern of mild LVH. Systolic function was normal. The estimated ejection fraction was in the range of 60% to 65%. Wall motion was normal; there were no regional wall motion abnormalities. Doppler parameters are consistent with abnormal left ventricular relaxation (grade 1 diastolic dysfunction). The E/e&' ratio is between 8-15, suggesting indeterminate LV filling pressure.  ------------------------------------------------------------------- Aorta:  Aortic root: The aortic root was normal in size. Ascending aorta: The ascending aorta was normal in size.  ------------------------------------------------------------------- Mitral valve:   Structurally normal valve.   Leaflet separation was normal.  Doppler:  Transvalvular velocity was within the normal range. There was no evidence for stenosis. There was no regurgitation.    Peak gradient (D): 5 mm Hg.  ------------------------------------------------------------------- Left atrium:  The atrium was normal in size.  ------------------------------------------------------------------- Atrial septum:  No defect or patent foramen ovale was identified.   ------------------------------------------------------------------- Right ventricle:  The cavity size was normal. Wall thickness was normal. Systolic function was normal.  ------------------------------------------------------------------- Pulmonic valve:    The valve appears to be grossly normal. Doppler:  There was no significant  regurgitation.  ------------------------------------------------------------------- Tricuspid valve:   Doppler:  There was trivial regurgitation.  ------------------------------------------------------------------- Pulmonary artery:   The main pulmonary artery was normal-sized.  ------------------------------------------------------------------- Right atrium:  The atrium was normal in size.  ------------------------------------------------------------------- Pericardium:  There was no pericardial effusion.  ------------------------------------------------------------------- Systemic veins: Inferior vena cava: The vessel was normal in size. The respirophasic diameter changes were in the normal range (>= 50%), consistent with normal central venous pressure.  ------------------------------------------------------------------- Measurements   Left ventricle                         Value        Reference  LV ID, ED, PLAX chordal                50    mm     43 - 52  LV ID, ES, PLAX chordal                34.3  mm     23 - 38  LV fx shortening, PLAX chordal         31    %      >=29  LV PW thickness, ED                    12    mm     ---------  IVS/LV PW ratio, ED                    0.88         <=1.3  Stroke volume, 2D                      107   ml     ---------  Stroke volume/bsa, 2D                  48    ml/m^2 ---------  LV e&', lateral                         8.58  cm/s   ---------  LV E/e&', lateral                       13.29        ---------  LV e&', medial                          8.39  cm/s   ---------  LV E/e&', medial                        13.59        ---------  LV e&', average                         8.49  cm/s   ---------  LV E/e&', average                       13.44        ---------    Ventricular septum                     Value        Reference  IVS thickness, ED                      10.5  mm     ---------    LVOT                                   Value         Reference  LVOT ID, S                             20    mm     ---------  LVOT area                              3.14  cm^2   ---------  LVOT peak velocity, S                  154   cm/s   ---------  LVOT mean velocity, S                  83.1  cm/s   ---------  LVOT VTI, S                            34.1  cm     ---------  LVOT peak gradient, S  9     mm Hg  ---------    Aorta                                  Value        Reference  Aortic root ID, ED                     29    mm     ---------    Left atrium                            Value        Reference  LA ID, A-P, ES                         38    mm     ---------  LA ID/bsa, A-P                         1.71  cm/m^2 <=2.2  LA volume, S                           42.7  ml     ---------  LA volume/bsa, S                       19.2  ml/m^2 ---------  LA volume, ES, 1-p A4C                 50.3  ml     ---------  LA volume/bsa, ES, 1-p A4C             22.6  ml/m^2 ---------  LA volume, ES, 1-p A2C                 32.1  ml     ---------  LA volume/bsa, ES, 1-p A2C             14.4  ml/m^2 ---------    Mitral valve                           Value        Reference  Mitral E-wave peak velocity            114   cm/s   ---------  Mitral A-wave peak velocity            147   cm/s   ---------  Mitral deceleration time               195   ms     150 - 230  Mitral peak gradient, D                5     mm Hg  ---------  Mitral E/A ratio, peak                 0.8          ---------    Pulmonary arteries                     Value        Reference  PA pressure, S, DP             (  H)     38    mm Hg  <=30    Tricuspid valve                        Value        Reference  Tricuspid regurg peak velocity         273   cm/s   ---------  Tricuspid peak RV-RA gradient          30    mm Hg  ---------    Systemic veins                         Value        Reference  Estimated CVP                          8     mm Hg  ---------     Right ventricle                        Value        Reference  TAPSE                                  25.2  mm     ---------  RV pressure, S, DP             (H)     38    mm Hg  <=30  RV s&', lateral, S                      20.9  cm/s   ---------  Legend: (L)  and  (H)  mark values outside specified reference range.  ------------------------------------------------------------------- Prepared and Electronically Authenticated by  Lyman Bishop MD 2017-08-08T10:01:50 [facility] Images   Show images for ECHOCARDIOGRAM COMPLETE Patient Information   Patient Name Edwin [name], Edwin [name] Sex Male DOB Apr 15, 1941 SSN [id number] Reason for Exam  Priority: Routine  Comments:  Surgical History   Surgical History    No past medical history on file.  Other Surgical History    Procedure Laterality Date Comment Source CATARACT EXTRACTION   bilat, implants Provider CHOLECYSTECTOMY  1995  Provider LUMBAR LAMINECTOMY  [phone]  Provider POLYPECTOMY  2003 neg 2006, Dr.Medoff Provider tear duct surgery  1998 for excess tearing, skin graft L foot @ age 39 1/2 Provider  Patient Data   Height  70.5 in  BP  154/83 mmHg    Performing Technologist/Nurse   Performing Technologist/Nurse: Jennette Dubin, RDCS          Implants    No active implants to display in this view. Order-Level Documents - 01/06/2016:   Scan on 01/08/2016 11:46 AM by Default, Provider, MDScan on 01/08/2016 11:46 AM by Default, Provider, MD    Encounter-Level Documents - 01/06/2016:   Document on 01/10/2016 8:28 PM by Royetta Crochet: ED Encounter Summary  Document on 01/10/2016 8:28 PM by Royetta Crochet: ED PB Summary  Scan on 01/09/2016 10:31 AM by Default, Provider, MDScan on 01/09/2016 10:31 AM by Default, Provider, MD  Document on 01/08/2016 10:27 AM by Roma Kayser, RN: IP After Visit Summary  Scan on 01/08/2016 9:04 AM by Default, Provider, MDScan on 01/08/2016 9:04 AM by Default, Provider, MD   Document on 01/06/2016 6:28  PM by Veryl Speak, MD: ED PB Summary  Electronic signature on 01/06/2016 4:27 PM - Signed  Electronic signature on 01/06/2016 4:26 PM - Signed    Signed   Electronically signed by Pixie Casino, MD on 01/08/16 at 1001 EDT Printable Result Report    Result Report  External Result Report    External Result Report     Reproductive/Obstetrics                             Anesthesia Physical Anesthesia Plan  ASA: II  Anesthesia Plan: General   Post-op Pain Management:    Induction: Intravenous  PONV Risk Score and Plan: 3 and Ondansetron and Dexamethasone  Airway Management Planned: Oral ETT  Additional Equipment:   Intra-op Plan:   Post-operative Plan: Extubation in OR  Informed Consent: I have reviewed the patients History and Physical, chart, labs and discussed the procedure including the risks, benefits and alternatives for the proposed anesthesia with the patient or authorized representative who has indicated his/her understanding and acceptance.   Dental advisory given  Plan Discussed with: CRNA and Surgeon  Anesthesia Plan Comments:         Anesthesia Quick Evaluation

## 2018-03-08 NOTE — H&P (Signed)
Subjective: The patient is a 77 year old white male who has complained of severe neck right shoulder, arm and hand pain.  He has failed medical management.  He was worked up with a cervical MRI which demonstrated herniated disc, spondylosis, foraminal stenosis at C4-5, C5-6 and C6-7.  I discussed the various treatment options with him.  He has decided to proceed with surgery.  Past Medical History:  Diagnosis Date  . Asthma   . COPD (chronic obstructive pulmonary disease) (HCC)    reactive airway disease  . Glaucoma   . Gout   . Hx of colonic polyps    Dr Earlean Shawl  . Hyperlipidemia    NMR lipoprofile 2005: LDL 113(1416/825), HLD 37, TG 190.  [name] Kitchen Hypertension   . Pneumonia     Past Surgical History:  Procedure Laterality Date  . CATARACT EXTRACTION     bilat, implants  . CHOLECYSTECTOMY  1995  . LUMBAR LAMINECTOMY  1982  . POLYPECTOMY  2003    neg 2006, Dr.Medoff  . tear duct surgery  1998   for excess tearing, skin graft L foot @ age 26 1/2    Allergies  Allergen Reactions  . Ramipril     REACTION: chest congestion & cough. No angioedema    Social History   Tobacco Use  . Smoking status: Former Smoker    Packs/day: 2.00    Years: 25.00    Pack years: 50.00    Types: Cigarettes    Last attempt to quit: 06/03/1983    Years since quitting: 34.7  . Smokeless tobacco: Never Used  . Tobacco comment: up to 2 ppd  Substance Use Topics  . Alcohol use: No    Comment: socially; 6-8 / week    Family History  Problem Relation Age of Onset  . Heart attack Father 19       due to asthma flare  . Asthma Father   . Emphysema Father   . Hypertension Mother   . Cancer Mother         ? primary  . Asthma Son        x2   Prior to Admission medications   Medication Sig Start Date End Date Taking? Authorizing Provider  allopurinol (ZYLOPRIM) 300 MG tablet TAKE 1 TABLET (300 MG TOTAL) BY MOUTH DAILY. Patient taking differently: Take 300 mg by mouth daily.  02/09/18  Yes Janith Lima,  MD  atorvastatin (LIPITOR) 20 MG tablet Take 1 tablet (20 mg total) by mouth daily. 10/09/17  Yes Janith Lima, MD  brimonidine (ALPHAGAN P) 0.1 % SOLN Place 1 drop into both eyes 2 (two) times daily. Both eyes    Yes [provider]  budesonide-formoterol (SYMBICORT) 80-4.5 MCG/ACT inhaler Inhale 2 puffs into the lungs 2 (two) times daily. 01/08/18  Yes Tanda Rockers, MD  famotidine (PEPCID) 20 MG tablet One at bedtime Patient taking differently: Take 20 mg by mouth at bedtime.  09/23/17  Yes Tanda Rockers, MD  FLUoxetine (PROZAC) 10 MG capsule TAKE 1 CAPSULE (10 MG TOTAL) BY MOUTH DAILY. 09/25/17  Yes Janith Lima, MD  gabapentin (NEURONTIN) 300 MG capsule Take 1 capsule (300 mg total) by mouth at bedtime for 7 days. Patient taking differently: Take 300 mg by mouth 3 (three) times daily.  02/28/18 03/07/18 Yes Murray, Alyssa B, PA-C  LUMIGAN 0.01 % SOLN Place 1 drop into both eyes at bedtime.  04/02/17  Yes [provider]  olmesartan-hydrochlorothiazide (BENICAR HCT) 40-12.5 MG tablet  Take 0.5 tablets by mouth daily. 06/03/17  Yes Janith Lima, MD  oxyCODONE-acetaminophen (PERCOCET/ROXICET) 5-325 MG tablet Take 2 tablets by mouth every 4 (four) hours as needed (pain).  03/02/18  Yes [provider]  pantoprazole (PROTONIX) 40 MG tablet TAKE 1 TABLET (40 MG TOTAL) BY MOUTH DAILY. TAKE 30-60 MIN BEFORE FIRST MEAL OF THE DAY 01/18/18  Yes Tanda Rockers, MD  predniSONE (DELTASONE) 10 MG tablet Take 20-60 mg by mouth See admin instructions. Take 6 tablets (60 mg) by mouth daily for 4 days, then take 4 tablets (40 mg) by mouth for 4 days, take 2 tablets (20 mg) by mouth daily for 4 days, then discontinue use.   Yes [provider]  psyllium (METAMUCIL) 58.6 % powder Take 1 packet by mouth daily.     Yes [provider]  acyclovir (ZOVIRAX) 200 MG capsule Take 200 mg by mouth 4 (four) times daily as needed (for fever blisters/cold sores.).    [provider]  dextromethorphan-guaiFENesin (MUCINEX DM) 30-600 MG 12hr tablet Take 1 tablet by mouth 2 (two) times daily as needed for cough.     [provider]  EPINEPHrine 0.3 mg/0.3 mL IJ SOAJ injection Inject 0.3 mLs (0.3 mg total) into the muscle once. Patient taking differently: Inject 0.3 mg into the muscle daily as needed (anaphylactic allergic reactions).  12/11/15   Kozlow, Donnamarie Poag, MD  HYDROcodone-acetaminophen (NORCO/VICODIN) 5-325 MG tablet Take 1-2 tablets by mouth every 6 (six) hours as needed. 02/28/18   Langston Masker B, PA-C  predniSONE (DELTASONE) 10 MG tablet Take  4 each am x 2 days,   2 each am x 2 days,  1 each am x 2 days and stop Patient not taking: Reported on 10/28/2017 09/23/17   Tanda Rockers, MD  VENTOLIN HFA 108 (90 Base) MCG/ACT inhaler INHALE 1-2 PUFFS INTO THE LUNGS EVERY 4 HOURS AS NEEDED FOR WHEEZING OR SHORTNESS OF BREATH Patient taking differently: Inhale 1-2 puffs into the lungs every 4 (four) hours as needed for wheezing or shortness of breath.  11/30/17   Tanda Rockers, MD     Review of Systems  Positive ROS: As above  All other systems have been reviewed and were otherwise negative with the exception of those mentioned in the HPI and as above.  Objective: Vital signs in last 24 hours: Temp:  [98 F (36.7 C)] 98 F (36.7 C) (10/07 1511) Pulse Rate:  [72] 72 (10/07 1511) Resp:  [18] 18 (10/07 1511) BP: (179)/(99) 179/99 (10/07 1511) SpO2:  [94 %] 94 % (10/07 1511) Weight:  [96.2 kg] 96.2 kg (10/07 1511) Estimated body mass index is 29.57 kg/m as calculated from the following:   Height as of this encounter: 5\' 11"  (1.803 m).   Weight as of this encounter: 96.2 kg.   General Appearance: Alert Head: Normocephalic, without obvious abnormality, atraumatic Eyes: PERRL, conjunctiva/corneas clear, EOM's intact,    Ears: Normal  Throat: Normal  Neck: Decreased range of motion.  Spurling's testing is positive on the right. Back:  unremarkable Lungs: Clear to auscultation bilaterally, respirations unlabored Heart: Regular rate and rhythm, no murmur, rub or gallop Abdomen: Soft, non-tender Extremities: Extremities normal, atraumatic, no cyanosis or edema Skin: unremarkable  NEUROLOGIC:   Mental status: alert and oriented,Motor Exam -he has weakness in his right tricep. Sensory Exam -he has numbness in the right C6 and C7 distribution Reflexes:  Coordination - grossly normal Gait - grossly normal Balance - grossly normal  Cranial Nerves: I: smell Not tested  II: visual acuity  OS: Normal  OD: Normal   II: visual Teem Full to confrontation  II: pupils Equal, round, reactive to light  III,VII: ptosis None  III,IV,VI: extraocular muscles  Full ROM  V: mastication Normal  V: facial light touch sensation  Normal  V,VII: corneal reflex  Present  VII: facial muscle function - upper  Normal  VII: facial muscle function - lower Normal  VIII: hearing Not tested  IX: soft palate elevation  Normal  IX,X: gag reflex Present  XI: trapezius strength  5/5  XI: sternocleidomastoid strength 5/5  XI: neck flexion strength  5/5  XII: tongue strength  Normal    Data Review Lab Results  Component Value Date   WBC 12.9 (H) 02/28/2018   HGB 14.7 02/28/2018   HCT 44.7 02/28/2018   MCV 97.6 02/28/2018   PLT 185 02/28/2018   Lab Results  Component Value Date   NA 138 02/28/2018   K 3.8 02/28/2018   CL 102 02/28/2018   CO2 23 02/28/2018   BUN 20 02/28/2018   CREATININE 1.09 02/28/2018   GLUCOSE 113 (H) 02/28/2018   No results found for: INR, PROTIME  Assessment/Plan: C4-5, C5-6 and C6-7 herniated disc, spondylosis, stenosis, cervicalgia, cervical radiculopathy: I have discussed the situation with the patient and his wife.  I have reviewed his MRI scan with him and pointed out the abnormalities.  We have discussed the various treatment options including surgery.  I have described the surgical treatment option of a  C4-5, C5-6 and C6-7 anterior cervical discectomy, fusion and plating.  I have shown him surgical models.  I have given them a surgical pamphlet.  We have discussed the risks, benefits, alternatives, expected postoperative course, and likelihood of achieving our goals with surgery.  I have answered all their questions.  He has decided to proceed with surgery.   Ophelia Charter 03/08/2018 4:55 PM

## 2018-03-09 MED ORDER — DOCUSATE SODIUM 100 MG PO CAPS: 100.0000 mg | ORAL_CAPSULE | Freq: Two times a day (BID) | ORAL | 0 refills | Status: DC

## 2018-03-09 NOTE — Discharge Summary (Signed)
Physician Discharge Summary  Patient ID: Edwin [name] MRN: [id number] DOB/AGE: [birth year] 76 y.o.  Admit date: 03/08/2018 Discharge date: 03/09/2018  Admission Diagnoses: C4-5, C5-6 and C6-7 spondylosis, herniated disc, cervicalgia, cervical radiculopathy  Discharge Diagnoses: The same Active Problems:   Cervical herniated disc   Discharged Condition: good  Hospital Course: I performed a C4-5, C5-6 and C6-7 anterior cervical discectomy, fusion and plating on the patient on 03/08/2018.  The surgery went well.  The patient's postoperative course was unremarkable.  On postoperative day #1 he requested discharge her home.  He was given written and oral discharge instructions.  All his questions were answered.  He tells me he has plenty of pain medication at home.  Consults: Occupational Therapy Significant Diagnostic Studies: None Treatments: C4-5, C5-6 and C6-7 anterior cervical discectomy, fusion and plating. Discharge Exam: Blood pressure (!) 161/90, pulse 85, temperature 98 F (36.7 C), temperature source Oral, resp. rate 19, height 5\' 11"  (1.803 m), weight 96.2 kg, SpO2 93 %. The patient is alert and pleasant.  He looks well.  His dressing is clean and dry.  There is no hematoma or shift.  Patient's drain has put out 30 cc.  His strength is normal.  Disposition: Home  Discharge Instructions    Call MD for:  difficulty breathing, headache or visual disturbances   Complete by:  As directed    Call MD for:  extreme fatigue   Complete by:  As directed    Call MD for:  hives   Complete by:  As directed    Call MD for:  persistant dizziness or light-headedness   Complete by:  As directed    Call MD for:  persistant nausea and vomiting   Complete by:  As directed    Call MD for:  redness, tenderness, or signs of infection (pain, swelling, redness, odor or green/yellow discharge around incision site)   Complete by:  As directed    Call MD for:  severe uncontrolled pain   Complete  by:  As directed    Call MD for:  temperature >100.4   Complete by:  As directed    Diet - low sodium heart healthy   Complete by:  As directed    Discharge instructions   Complete by:  As directed    Call [phone] for a followup appointment. Take a stool softener while you are using pain medications.   Driving Restrictions   Complete by:  As directed    Do not drive for 2 weeks.   Increase activity slowly   Complete by:  As directed    Lifting restrictions   Complete by:  As directed    Do not lift more than 5 pounds. No excessive bending or twisting.   May shower / Bathe   Complete by:  As directed    Remove the dressing for 3 days after surgery.  You may shower, but leave the incision alone.   Remove dressing in 48 hours   Complete by:  As directed    Your stitches are under the scan and will dissolve by themselves. The Steri-Strips will fall off after you take a few showers. Do not rub back or pick at the wound, Leave the wound alone.     Allergies as of 03/09/2018      Reactions   Ramipril    REACTION: chest congestion & cough. No angioedema      Medication List    STOP taking these medications   HYDROcodone-acetaminophen  5-325 MG tablet Commonly known as:  NORCO/VICODIN   oxyCODONE-acetaminophen 5-325 MG tablet Commonly known as:  PERCOCET/ROXICET   predniSONE 10 MG tablet Commonly known as:  DELTASONE     TAKE these medications   acyclovir 200 MG capsule Commonly known as:  ZOVIRAX Take 200 mg by mouth 4 (four) times daily as needed (for fever blisters/cold sores.).   allopurinol 300 MG tablet Commonly known as:  ZYLOPRIM TAKE 1 TABLET (300 MG TOTAL) BY MOUTH DAILY. What changed:  See the new instructions.   atorvastatin 20 MG tablet Commonly known as:  LIPITOR Take 1 tablet (20 mg total) by mouth daily.   brimonidine 0.1 % Soln Commonly known as:  ALPHAGAN P Place 1 drop into both eyes 2 (two) times daily. Both eyes   budesonide-formoterol  80-4.5 MCG/ACT inhaler Commonly known as:  SYMBICORT Inhale 2 puffs into the lungs 2 (two) times daily.   dextromethorphan-guaiFENesin 30-600 MG 12hr tablet Commonly known as:  MUCINEX DM Take 1 tablet by mouth 2 (two) times daily as needed for cough.   docusate sodium 100 MG capsule Commonly known as:  COLACE Take 1 capsule (100 mg total) by mouth 2 (two) times daily.   EPINEPHrine 0.3 mg/0.3 mL Soaj injection Commonly known as:  EPI-PEN Inject 0.3 mLs (0.3 mg total) into the muscle once. What changed:    when to take this  reasons to take this   famotidine 20 MG tablet Commonly known as:  PEPCID One at bedtime What changed:    how much to take  how to take this  when to take this  additional instructions   FLUoxetine 10 MG capsule Commonly known as:  PROZAC TAKE 1 CAPSULE (10 MG TOTAL) BY MOUTH DAILY.   gabapentin 300 MG capsule Commonly known as:  NEURONTIN Take 1 capsule (300 mg total) by mouth at bedtime for 7 days. What changed:  when to take this   LUMIGAN 0.01 % Soln Generic drug:  bimatoprost Place 1 drop into both eyes at bedtime.   olmesartan-hydrochlorothiazide 40-12.5 MG tablet Commonly known as:  BENICAR HCT Take 0.5 tablets by mouth daily.   pantoprazole 40 MG tablet Commonly known as:  PROTONIX TAKE 1 TABLET (40 MG TOTAL) BY MOUTH DAILY. TAKE 30-60 MIN BEFORE FIRST MEAL OF THE DAY   psyllium 58.6 % powder Commonly known as:  METAMUCIL Take 1 packet by mouth daily.   VENTOLIN HFA 108 (90 Base) MCG/ACT inhaler Generic drug:  albuterol INHALE 1-2 PUFFS INTO THE LUNGS EVERY 4 HOURS AS NEEDED FOR WHEEZING OR SHORTNESS OF BREATH What changed:  See the new instructions.        Signed: Ophelia Charter 03/09/2018, 7:43 AM

## 2018-03-09 NOTE — Progress Notes (Signed)
Pt doing well. Pt and son given D/C instructions with verbal understanding. Pt's incision is clean and dry with no sign of infection. Pt's IV and JP drain were removed prior to D/C. Pt D/C'd home via wheelchair @ 0840 per MD order. Pt is stable @ D/C and has no other needs at this time. Holli Humbles, RN

## 2018-03-09 NOTE — Evaluation (Signed)
Occupational Therapy Evaluation Patient Details Name: Edwin [name] MRN: [id number] DOB: 01-11-1941 Today's Date: 03/09/2018    History of Present Illness 77 y.o. male s/p C4-5, C5-6 and C6-7 anterior cervical discectomy/decompression. PMH including COPD, glaucoma, and HTN.   Clinical Impression   PTA, pt was living with his wife and was independent. Currently, pt requires supervision for ADLs and functional mobility. Provided education and handout on cervical precautions, bed mobility, collar management, UB ADLs, LB ADLs, toileting, and shower transfer; pt demonstrated and verbalized understanding. Answered all pt questions. Recommend dc home once medically stable per physician. All acute OT needs met and will sign off. Thank you.     Follow Up Recommendations  No OT follow up;Supervision/Assistance - 24 hour    Equipment Recommendations  None recommended by OT    Recommendations for Other Services       Precautions / Restrictions Precautions Precautions: Cervical Precaution Booklet Issued: Yes (comment) Precaution Comments: Reviewed cervical precautions and adherance during ADLs Required Braces or Orthoses: Cervical Brace Cervical Brace: Hard collar Restrictions Weight Bearing Restrictions: No      Mobility Bed Mobility Overal bed mobility: Modified Independent             General bed mobility comments: Education on log roll. Increased cues at first and pt performing several times for practice. Pt demonstrating understanding  Transfers Overall transfer level: Needs assistance Equipment used: None Transfers: Sit to/from Stand Sit to Stand: Supervision         General transfer comment: supervision for safety    Balance Overall balance assessment: No apparent balance deficits (not formally assessed)                                         ADL either performed or assessed with clinical judgement   ADL Overall ADL's : Needs  assistance/impaired                                       General ADL Comments: Pt performing ADLs and functional mobility at supervision level. Cues for adherance to cervical precautions. Providing education on bed mobility, UB ADLs, LB ADLs, collar management, toileting, and shower transfers. Pt demonstrated and verbalized understanding     Vision Baseline Vision/History: Wears glasses Patient Visual Report: No change from baseline       Perception     Praxis      Pertinent Vitals/Pain Pain Assessment: Faces Faces Pain Scale: Hurts a little bit Pain Location: Neck Pain Descriptors / Indicators: Discomfort;Sore Pain Intervention(s): Monitored during session;Repositioned     Hand Dominance     Extremity/Trunk Assessment Upper Extremity Assessment Upper Extremity Assessment: Overall WFL for tasks assessed   Lower Extremity Assessment Lower Extremity Assessment: Overall WFL for tasks assessed   Cervical / Trunk Assessment Cervical / Trunk Assessment: Other exceptions Cervical / Trunk Exceptions: s/p cervical sx   Communication Communication Communication: No difficulties   Cognition Arousal/Alertness: Awake/alert Behavior During Therapy: WFL for tasks assessed/performed Overall Cognitive Status: Within Functional Limits for tasks assessed                                     General Comments       Exercises  Shoulder Instructions      Home Living Family/patient expects to be discharged to:: Private residence Living Arrangements: Spouse/significant other Available Help at Discharge: Family;Available 24 hours/day Type of Home: House Home Access: Stairs to enter [employer] of Steps: 2   Home Layout: Two level;Able to live on main level with bedroom/bathroom     Bathroom Shower/Tub: Occupational psychologist: Standard     Home Equipment: Cane - single point          Prior Functioning/Environment  Level of Independence: Independent                 OT Problem List: Decreased activity tolerance;Impaired balance (sitting and/or standing);Decreased range of motion;Decreased knowledge of use of DME or AE;Decreased knowledge of precautions;Pain      OT Treatment/Interventions:      OT Goals(Current goals can be found in the care plan section) Acute Rehab OT Goals Patient Stated Goal: Go home today OT Goal Formulation: All assessment and education complete, DC therapy  OT Frequency:     Barriers to D/C:            Co-evaluation              AM-PAC PT "6 Clicks" Daily Activity     Outcome Measure Help from another person eating meals?: None Help from another person taking care of personal grooming?: None Help from another person toileting, which includes using toliet, bedpan, or urinal?: None Help from another person bathing (including washing, rinsing, drying)?: None Help from another person to put on and taking off regular upper body clothing?: None Help from another person to put on and taking off regular lower body clothing?: None 6 Click Score: 24   End of Session Equipment Utilized During Treatment: Cervical collar Nurse Communication: Mobility status;Precautions  Activity Tolerance: Patient tolerated treatment well Patient left: in chair;with call bell/phone within reach  OT Visit Diagnosis: Unsteadiness on feet (R26.81);Other abnormalities of gait and mobility (R26.89);Pain Pain - part of body: (Neck)                Time: 1696-7893 OT Time Calculation (min): 21 min Charges:  OT General Charges $OT Visit: 1 Visit OT Evaluation $OT Eval Low Complexity: [place], OTR/L Acute Rehab Pager: [phone] Office: [place] 03/09/2018, 8:48 AM

## 2018-03-10 ENCOUNTER — Telehealth: Payer: Self-pay | Admitting: *Deleted

## 2018-03-10 NOTE — Telephone Encounter (Signed)
Pt was on TCM report admitted 03/08/18 for C4-5, C5-6 and C6-7 spondylosis, herniated disc, cervicalgia, cervical radiculopathy. Pt underwent a C4-5, C5-6 and C6-7 anterior cervical discectomy, fusion and plating on 03/08/2018.  The surgery went well. Pt D/C10/8/19, will follow-up w/surgeon in 2 weeks.[name] KitchenJohny Chess

## 2018-03-11 ENCOUNTER — Encounter (HOSPITAL_COMMUNITY): Payer: Self-pay | Admitting: Neurosurgery

## 2018-03-13 ENCOUNTER — Ambulatory Visit (HOSPITAL_COMMUNITY)
Admission: EM | Admit: 2018-03-13 | Discharge: 2018-03-13 | Disposition: A | Discharge status: Home / Self Care | Payer: Medicare Other | Attending: Family Medicine | Admitting: Family Medicine

## 2018-03-13 ENCOUNTER — Encounter (HOSPITAL_COMMUNITY): Payer: Self-pay | Admitting: Emergency Medicine

## 2018-03-13 DIAGNOSIS — E785 Hyperlipidemia, unspecified: Secondary | ICD-10-CM | POA: Diagnosis not present

## 2018-03-13 DIAGNOSIS — J029 Acute pharyngitis, unspecified: Secondary | ICD-10-CM | POA: Diagnosis not present

## 2018-03-13 DIAGNOSIS — J449 Chronic obstructive pulmonary disease, unspecified: Secondary | ICD-10-CM | POA: Diagnosis not present

## 2018-03-13 DIAGNOSIS — R070 Pain in throat: Secondary | ICD-10-CM

## 2018-03-13 DIAGNOSIS — Z87891 Personal history of nicotine dependence: Secondary | ICD-10-CM | POA: Insufficient documentation

## 2018-03-13 DIAGNOSIS — M109 Gout, unspecified: Secondary | ICD-10-CM | POA: Insufficient documentation

## 2018-03-13 DIAGNOSIS — I10 Essential (primary) hypertension: Secondary | ICD-10-CM | POA: Diagnosis not present

## 2018-03-13 DIAGNOSIS — H409 Unspecified glaucoma: Secondary | ICD-10-CM | POA: Diagnosis not present

## 2018-03-13 LAB — POCT RAPID STREP A: Streptococcus, Group A Screen (Direct): NEGATIVE

## 2018-03-13 MED ORDER — LIDOCAINE VISCOUS HCL 2 % MT SOLN: 5.0000 mL | OROMUCOSAL | 0 refills | Status: DC | PRN

## 2018-03-13 MED ORDER — TRAMADOL HCL 50 MG PO TABS: 50.0000 mg | ORAL_TABLET | Freq: Four times a day (QID) | ORAL | 0 refills | Status: DC | PRN

## 2018-03-13 NOTE — ED Triage Notes (Addendum)
Pt states he had surgery on his neck Monday night and ever since then hes had a sore throat. Pt resp equal and unlabored, no difficutly breathing. Able to swallow but with pain. Incision on neck is clean and dry. Pt in NAD.

## 2018-03-13 NOTE — Discharge Instructions (Signed)
Use 1000 mg tylenol every 8 hours and add in 400-600 mg ibuprofen every 8 hours.  If the pain pushes through take a tramadol.  The lidocaine topical swish may be most helpful before a meal so you can eat.

## 2018-03-13 NOTE — ED Provider Notes (Signed)
03/13/2018 2:16 PM   DOB: 1940-10-16 / MRN: [id number]  SUBJECTIVE:  Edwin [name] is a 77 y.o. male presenting for sore throat that started after neck surgery about 6 days ago.  Assoicates pain with swallowing.  Denies fever.  Has tried oxycodone but pain got worse as soon as he stopped.    He is allergic to ramipril.   He  has a past medical history of Asthma, COPD (chronic obstructive pulmonary disease) ([place]), Glaucoma, Gout, colonic polyps, Hyperlipidemia, Hypertension, and Pneumonia.    He  reports that he quit smoking about 34 years ago. His smoking use included cigarettes. He has a 50.00 pack-year smoking history. He has never used smokeless tobacco. He reports that he does not drink alcohol or use drugs. He  reports that he currently engages in sexual activity. The patient  has a past surgical history that includes tear duct surgery (1998); Cataract extraction; Cholecystectomy (1995); Polypectomy (2003); Lumbar laminectomy (1982); and Anterior cervical decomp/discectomy fusion (N/A, 03/08/2018).  His family history includes Asthma in his father and son; Cancer in his mother; Emphysema in his father; Heart attack (age of onset: 57) in his father; Hypertension in his mother.  Review of Systems  Constitutional: Negative for chills, diaphoresis and fever.  HENT: Positive for sore throat.   Eyes: Negative.   Respiratory: Negative for cough, hemoptysis, sputum production, shortness of breath and wheezing.   Cardiovascular: Negative for chest pain, orthopnea and leg swelling.  Gastrointestinal: Negative for abdominal pain, blood in stool, constipation, diarrhea, heartburn, melena, nausea and vomiting.  Genitourinary: Negative for dysuria, flank pain, frequency, hematuria and urgency.  Musculoskeletal: Positive for myalgias and neck pain. Negative for back pain, falls and joint pain.  Skin: Negative for rash.  Neurological: Negative for dizziness, sensory change, speech change, focal weakness  and headaches.    OBJECTIVE:  BP 104/87   Pulse 90   Temp 98 F (36.7 C)   Resp 18   SpO2 98%   Wt Readings from Last 3 Encounters:  03/08/18 212 lb (96.2 kg)  02/28/18 215 lb (97.5 kg)  12/11/17 215 lb (97.5 kg)   Temp Readings from Last 3 Encounters:  03/13/18 98 F (36.7 C)  03/09/18 98 F (36.7 C) (Oral)  02/28/18 97.8 F (36.6 C) (Oral)   BP Readings from Last 3 Encounters:  03/13/18 104/87  03/09/18 (!) 161/90  02/28/18 105/83   Pulse Readings from Last 3 Encounters:  03/13/18 90  03/09/18 85  02/28/18 61    Physical Exam  Constitutional: He is oriented to person, place, and time. He appears well-developed. He does not appear ill.  HENT:  Right Ear: Hearing, tympanic membrane, external ear and ear canal normal.  Left Ear: Hearing, tympanic membrane, external ear and ear canal normal.  Nose: Nose normal. Right sinus exhibits no maxillary sinus tenderness and no frontal sinus tenderness. Left sinus exhibits no maxillary sinus tenderness and no frontal sinus tenderness.  Mouth/Throat: Uvula is midline, oropharynx is clear and moist and mucous membranes are normal. No oropharyngeal exudate, posterior oropharyngeal edema or tonsillar abscesses.  Eyes: Pupils are equal, round, and reactive to light. Conjunctivae and EOM are normal.  Cardiovascular: Normal rate, regular rhythm, S1 normal, S2 normal, normal heart sounds, intact distal pulses and normal pulses. Exam reveals no gallop and no friction rub.  No murmur heard. Pulmonary/Chest: Effort normal. He has no rales.  Abdominal: He exhibits no distension.  Musculoskeletal: Normal range of motion. He exhibits no edema.  Lymphadenopathy:  Head (right side): No submandibular and no tonsillar adenopathy present.       Head (left side): No submandibular and no tonsillar adenopathy present.    He has no cervical adenopathy.  Neurological: He is alert and oriented to person, place, and time. No cranial nerve  deficit. Coordination normal.  Skin: Skin is warm and dry. He is not diaphoretic.  Psychiatric: He has a normal mood and affect.  Nursing note and vitals reviewed.   Results for orders placed or performed during the hospital encounter of 03/13/18 (from the past 72 hour(s))  POCT rapid strep A [facility])     Status: None   Collection Time: 03/13/18  2:06 PM  Result Value Ref Range   Streptococcus, Group A Screen (Direct) NEGATIVE NEGATIVE    No results found.  ASSESSMENT AND PLAN:   Throat pain in adult    Discharge Instructions     Use 1000 mg tylenol every 8 hours and add in 400-600 mg ibuprofen every 8 hours.  If the pain pushes through take a tramadol.  The lidocaine topical swish may be most helpful before a meal so you can eat.        The patient is advised to call or return to clinic if he does not see an improvement in symptoms, or to seek the care of the closest emergency department if he worsens with the above plan.   Philis Fendt, MHS, PA-C 03/13/2018 2:16 PM   Tereasa Coop, PA-C 03/13/18 1418

## 2018-03-15 ENCOUNTER — Ambulatory Visit (INDEPENDENT_AMBULATORY_CARE_PROVIDER_SITE_OTHER): Payer: Medicare Other | Admitting: Allergy

## 2018-03-15 ENCOUNTER — Encounter: Payer: Self-pay | Admitting: Allergy

## 2018-03-15 VITALS — BP 100/80 | HR 90 | Temp 97.7°F | Resp 18

## 2018-03-15 DIAGNOSIS — J029 Acute pharyngitis, unspecified: Secondary | ICD-10-CM | POA: Diagnosis not present

## 2018-03-15 NOTE — Patient Instructions (Addendum)
Follow up with surgeon regarding sore neck and painful swallowing. Will follow up with throat culture.  Use the lidocaine solution as needed for throat pain.  Return in about 4 weeks (around 04/12/2018). with Dr. Neldon Mc

## 2018-03-15 NOTE — Progress Notes (Signed)
Follow Up Note  RE: Edwin [name] MRN: [id number] DOB: 06-14-1940 Date of Office Visit: 03/15/2018  Referring provider: Janith Lima, MD Primary care provider: Janith Lima, MD  Chief Complaint: Sore Throat  History of Present Illness: I had the pleasure of seeing Edwin [name] for a follow up visit at the [facility] [place] [facility] [place] on 03/15/2018. He is a 77 y.o. male, who is being followed for asthma/COPD, allergic rhinitis. Today he is here for new complaint of sore throat. He is accompanied today by his wife who provided/contributed to the history. His previous allergy office visit was on 08/11/2017 with Dr. Neldon Mc.    Patient has been having severe sore throat for the past 5 days. Patient had cervical surgery on Monday for 3 hours and they did go in anteriorly and has stitches in place. Patient was intubated for this surgery.  Denies any fevers. Patient does have PND and some yellowish discharge possibly but main concern is the pain with swallowing. He has tried some tramadol which caused dizziness and nausea. Used lidocaine gargles once which did not help.  He did go to the ER on 03/13/2018 for the same complaints and rapid strep was negative. Cultures are pending.  Patient is still eating and drinking appropriate amounts.  Assessment and Plan: Edwin [name] is a 77 y.o. male with: Sore throat Sore throat which started on POD#2 s/p cervical surgery. Denies any fevers. ER visit showed negative strep and culture pending.  Discussed with patient and wife that most likely the sore throat is from possible trauma from the surgery/intubation.  Advised to contact surgeon regarding this issue.  Start using lidocaine as prescribed by the ER for sore throat.  Return in about 4 weeks (around 04/12/2018).  Diagnostics: None  Medication List:  Current Outpatient Medications  Medication Sig Dispense Refill  . acyclovir (ZOVIRAX) 200 MG capsule Take 200 mg by mouth 4 (four) times  daily as needed (for fever blisters/cold sores.).    [name] Kitchen allopurinol (ZYLOPRIM) 300 MG tablet TAKE 1 TABLET (300 MG TOTAL) BY MOUTH DAILY. (Patient taking differently: Take 300 mg by mouth daily. ) 90 tablet 1  . atorvastatin (LIPITOR) 20 MG tablet Take 1 tablet (20 mg total) by mouth daily. 90 tablet 1  . brimonidine (ALPHAGAN P) 0.1 % SOLN Place 1 drop into both eyes 2 (two) times daily. Both eyes     . budesonide-formoterol (SYMBICORT) 80-4.5 MCG/ACT inhaler Inhale 2 puffs into the lungs 2 (two) times daily. 1 Inhaler 5  . docusate sodium (COLACE) 100 MG capsule Take 1 capsule (100 mg total) by mouth 2 (two) times daily. 60 capsule 0  . EPINEPHrine 0.3 mg/0.3 mL IJ SOAJ injection Inject 0.3 mLs (0.3 mg total) into the muscle once. (Patient taking differently: Inject 0.3 mg into the muscle daily as needed (anaphylactic allergic reactions). ) 2 Device 1  . famotidine (PEPCID) 20 MG tablet One at bedtime (Patient taking differently: Take 20 mg by mouth at bedtime. ) 30 tablet 11  . FLUoxetine (PROZAC) 10 MG capsule TAKE 1 CAPSULE (10 MG TOTAL) BY MOUTH DAILY. 90 capsule 1  . ibuprofen (ADVIL) 200 MG tablet Take 200 mg by mouth every 6 (six) hours as needed.    . lidocaine (XYLOCAINE) 2 % solution Use as directed 5-10 mLs in the mouth or throat as needed for mouth pain. 200 mL 0  . LUMIGAN 0.01 % SOLN Place 1 drop into both eyes at bedtime.     [name] Kitchen  olmesartan-hydrochlorothiazide (BENICAR HCT) 40-12.5 MG tablet Take 0.5 tablets by mouth daily. 90 tablet 1  . pantoprazole (PROTONIX) 40 MG tablet TAKE 1 TABLET (40 MG TOTAL) BY MOUTH DAILY. TAKE 30-60 MIN BEFORE FIRST MEAL OF THE DAY 30 tablet 5  . psyllium (METAMUCIL) 58.6 % powder Take 1 packet by mouth daily.      . VENTOLIN HFA 108 (90 Base) MCG/ACT inhaler INHALE 1-2 PUFFS INTO THE LUNGS EVERY 4 HOURS AS NEEDED FOR WHEEZING OR SHORTNESS OF BREATH (Patient taking differently: Inhale 1-2 puffs into the lungs every 4 (four) hours as needed for wheezing or  shortness of breath. ) 1 Inhaler 2  . dextromethorphan-guaiFENesin (MUCINEX DM) 30-600 MG 12hr tablet Take 1 tablet by mouth 2 (two) times daily as needed for cough.     . gabapentin (NEURONTIN) 300 MG capsule Take 1 capsule (300 mg total) by mouth at bedtime for 7 days. (Patient taking differently: Take 300 mg by mouth 3 (three) times daily. ) 7 capsule 0  . traMADol (ULTRAM) 50 MG tablet Take 1 tablet (50 mg total) by mouth every 6 (six) hours as needed. (Patient not taking: Reported on 03/15/2018) 15 tablet 0   Current Facility-Administered Medications  Medication Dose Route Frequency Provider Last Rate Last Dose  . omalizumab Arvid Right) injection 375 mg  375 mg Subcutaneous Q14 Days Jiles Prows, MD   375 mg at 03/04/18 1409   Allergies: Allergies  Allergen Reactions  . Ramipril     REACTION: chest congestion & cough. No angioedema   I reviewed his past medical history, social history, family history, and environmental history and no significant changes have been reported from previous visit on 08/11/2017.  Review of Systems  Constitutional: Negative for appetite change, chills, fever and unexpected weight change.  HENT: Positive for postnasal drip and sore throat. Negative for congestion and rhinorrhea.   Gastrointestinal: Negative for abdominal pain.   Objective: BP 100/80 (BP Location: Right Arm, Patient Position: Sitting, Cuff Size: Normal)   Pulse 90   Temp 97.7 F (36.5 C) (Oral)   Resp 18   SpO2 96%  There is no height or weight on file to calculate BMI. Physical Exam  Constitutional: He is oriented to person, place, and time. He appears well-developed and well-nourished.  HENT:  Head: Normocephalic and atraumatic.  Right Ear: External ear normal.  Left Ear: External ear normal.  Nose: Nose normal.  Mouth/Throat: Oropharynx is clear and moist.  Eyes: Conjunctivae and EOM are normal.  Neck: Neck supple.  steristrips on anterior neck  Cardiovascular: Normal rate,  regular rhythm and normal heart sounds. Exam reveals no gallop and no friction rub.  No murmur heard. Pulmonary/Chest: Effort normal and breath sounds normal. He has no wheezes. He has no rales.  Lymphadenopathy:    He has no cervical adenopathy.  Neurological: He is alert and oriented to person, place, and time.  Skin: Skin is warm.  Psychiatric: He has a normal mood and affect. His behavior is normal.  Nursing note and vitals reviewed.  Previous notes and tests were reviewed. The plan was reviewed with the patient/family, and all questions/concerned were addressed.  It was my pleasure to see Tyquavious today and participate in his care. Please feel free to contact me with any questions or concerns.  Sincerely,  Rexene Alberts, DO [facility] [place] [facility] [place]

## 2018-03-15 NOTE — Assessment & Plan Note (Addendum)
Sore throat which started on POD#2 s/p cervical surgery. Denies any fevers. ER visit showed negative strep and culture pending.  Discussed with patient and wife that most likely the sore throat is from possible trauma from the surgery/intubation.  Advised to contact surgeon regarding this issue.  Start using lidocaine as prescribed by the ER for sore throat.

## 2018-03-16 LAB — CULTURE, GROUP A STREP (THRC)

## 2018-03-17 ENCOUNTER — Other Ambulatory Visit (HOSPITAL_COMMUNITY): Payer: Medicare Other

## 2018-03-17 DIAGNOSIS — J4540 Moderate persistent asthma, uncomplicated: Secondary | ICD-10-CM | POA: Diagnosis not present

## 2018-03-18 ENCOUNTER — Ambulatory Visit (INDEPENDENT_AMBULATORY_CARE_PROVIDER_SITE_OTHER): Payer: Medicare Other | Admitting: *Deleted

## 2018-03-18 DIAGNOSIS — J4540 Moderate persistent asthma, uncomplicated: Secondary | ICD-10-CM | POA: Diagnosis not present

## 2018-03-30 DIAGNOSIS — M4722 Other spondylosis with radiculopathy, cervical region: Secondary | ICD-10-CM | POA: Diagnosis not present

## 2018-03-30 DIAGNOSIS — Z6829 Body mass index (BMI) 29.0-29.9, adult: Secondary | ICD-10-CM | POA: Diagnosis not present

## 2018-03-30 DIAGNOSIS — I10 Essential (primary) hypertension: Secondary | ICD-10-CM | POA: Diagnosis not present

## 2018-03-30 DIAGNOSIS — M5020 Other cervical disc displacement, unspecified cervical region: Secondary | ICD-10-CM | POA: Diagnosis not present

## 2018-03-31 ENCOUNTER — Other Ambulatory Visit: Payer: Self-pay | Admitting: Internal Medicine

## 2018-04-01 ENCOUNTER — Ambulatory Visit: Payer: Self-pay

## 2018-04-06 DIAGNOSIS — J4540 Moderate persistent asthma, uncomplicated: Secondary | ICD-10-CM | POA: Diagnosis not present

## 2018-04-07 ENCOUNTER — Ambulatory Visit (INDEPENDENT_AMBULATORY_CARE_PROVIDER_SITE_OTHER): Payer: Medicare Other | Admitting: *Deleted

## 2018-04-07 DIAGNOSIS — J4540 Moderate persistent asthma, uncomplicated: Secondary | ICD-10-CM | POA: Diagnosis not present

## 2018-04-08 ENCOUNTER — Other Ambulatory Visit: Payer: Self-pay | Admitting: Internal Medicine

## 2018-04-16 ENCOUNTER — Other Ambulatory Visit: Payer: Self-pay | Admitting: Internal Medicine

## 2018-04-16 DIAGNOSIS — E785 Hyperlipidemia, unspecified: Secondary | ICD-10-CM

## 2018-04-20 ENCOUNTER — Ambulatory Visit: Payer: Medicare Other | Admitting: Allergy and Immunology

## 2018-04-20 DIAGNOSIS — J4540 Moderate persistent asthma, uncomplicated: Secondary | ICD-10-CM | POA: Diagnosis not present

## 2018-04-21 ENCOUNTER — Ambulatory Visit (INDEPENDENT_AMBULATORY_CARE_PROVIDER_SITE_OTHER): Payer: Medicare Other | Admitting: *Deleted

## 2018-04-21 DIAGNOSIS — J4540 Moderate persistent asthma, uncomplicated: Secondary | ICD-10-CM | POA: Diagnosis not present

## 2018-05-03 DIAGNOSIS — J3089 Other allergic rhinitis: Secondary | ICD-10-CM | POA: Diagnosis not present

## 2018-05-03 DIAGNOSIS — J441 Chronic obstructive pulmonary disease with (acute) exacerbation: Secondary | ICD-10-CM | POA: Diagnosis not present

## 2018-05-03 DIAGNOSIS — J849 Interstitial pulmonary disease, unspecified: Secondary | ICD-10-CM | POA: Diagnosis not present

## 2018-05-03 DIAGNOSIS — J4550 Severe persistent asthma, uncomplicated: Secondary | ICD-10-CM

## 2018-05-03 DIAGNOSIS — K219 Gastro-esophageal reflux disease without esophagitis: Secondary | ICD-10-CM | POA: Diagnosis not present

## 2018-05-04 ENCOUNTER — Ambulatory Visit (INDEPENDENT_AMBULATORY_CARE_PROVIDER_SITE_OTHER): Payer: Medicare Other | Admitting: Allergy and Immunology

## 2018-05-04 ENCOUNTER — Encounter: Payer: Self-pay | Admitting: Allergy and Immunology

## 2018-05-04 VITALS — BP 120/72 | HR 93 | Temp 98.3°F | Resp 18

## 2018-05-04 DIAGNOSIS — K219 Gastro-esophageal reflux disease without esophagitis: Secondary | ICD-10-CM | POA: Diagnosis not present

## 2018-05-04 DIAGNOSIS — J849 Interstitial pulmonary disease, unspecified: Secondary | ICD-10-CM | POA: Diagnosis not present

## 2018-05-04 DIAGNOSIS — J3089 Other allergic rhinitis: Secondary | ICD-10-CM

## 2018-05-04 DIAGNOSIS — J441 Chronic obstructive pulmonary disease with (acute) exacerbation: Secondary | ICD-10-CM

## 2018-05-04 DIAGNOSIS — J4550 Severe persistent asthma, uncomplicated: Secondary | ICD-10-CM | POA: Diagnosis not present

## 2018-05-04 MED ORDER — PREDNISONE 10 MG PO TABS: ORAL_TABLET | ORAL | 0 refills | Status: DC

## 2018-05-04 MED ORDER — CLINDAMYCIN HCL 150 MG PO CAPS: 150.0000 mg | ORAL_CAPSULE | Freq: Three times a day (TID) | ORAL | 0 refills | Status: AC

## 2018-05-04 NOTE — Progress Notes (Signed)
Follow-up Note  Referring Provider: Janith Lima, MD Primary Provider: Janith Lima, MD Date of Office Visit: 05/04/2018  Subjective:   Edwin [name] (DOB: [birth year]) is a 77 y.o. male who returns to the [facility] [place] on 05/04/2018 in re-evaluation of the following:  HPI: Edwin [name] returns to this clinic in reevaluation of his asthma and COPD and interstitial pneumonitis and allergic rhinitis and reflux.  I have not seen him in this clinic since 11 August 2017.  Overall he believes that he has done relatively well since his last visit and is very satisfied with the response he has received on his current treatment plan which includes omalizumab as well as a collection of anti-inflammatory agents directed against his airway inflammation and also therapy directed against reflux.  Usually he can exert himself without much difficulty and does not need to use a short acting bronchodilator.  However, 1 week ago, he developed cough and wheezing and nasal congestion and now has ugly nasal discharge and yellow-green sputum production.  He does not have any associated systemic or constitutional symptoms.  He did receive the flu vaccine this year.  Allergies as of 05/04/2018      Reactions   Ramipril    REACTION: chest congestion & cough. No angioedema      Medication List      acyclovir 200 MG capsule Commonly known as:  ZOVIRAX Take 200 mg by mouth 4 (four) times daily as needed (for fever blisters/cold sores.).   ADVIL 200 MG tablet Generic drug:  ibuprofen Take 200 mg by mouth every 6 (six) hours as needed.   allopurinol 300 MG tablet Commonly known as:  ZYLOPRIM TAKE 1 TABLET (300 MG TOTAL) BY MOUTH DAILY.   atorvastatin 20 MG tablet Commonly known as:  LIPITOR TAKE 1 TABLET (20 MG TOTAL) BY MOUTH DAILY.   brimonidine 0.1 % Soln Commonly known as:  ALPHAGAN P Place 1 drop into both eyes 2 (two) times daily. Both eyes   clindamycin 150 MG  capsule Commonly known as:  CLEOCIN Take 1 capsule (150 mg total) by mouth 3 (three) times daily for 20 days.   dextromethorphan-guaiFENesin 30-600 MG 12hr tablet Commonly known as:  MUCINEX DM Take 1 tablet by mouth 2 (two) times daily as needed for cough.   docusate sodium 100 MG capsule Commonly known as:  COLACE Take 1 capsule (100 mg total) by mouth 2 (two) times daily.   EPINEPHrine 0.3 mg/0.3 mL Soaj injection Commonly known as:  EPI-PEN Inject 0.3 mLs (0.3 mg total) into the muscle once.   famotidine 20 MG tablet Commonly known as:  PEPCID One at bedtime   FLUoxetine 10 MG capsule Commonly known as:  PROZAC TAKE 1 CAPSULE (10 MG TOTAL) BY MOUTH DAILY.   gabapentin 300 MG capsule Commonly known as:  NEURONTIN Take 1 capsule (300 mg total) by mouth at bedtime for 7 days.   olmesartan-hydrochlorothiazide 40-12.5 MG tablet Commonly known as:  BENICAR HCT Take 0.5 tablets by mouth daily.   pantoprazole 40 MG tablet Commonly known as:  PROTONIX TAKE 1 TABLET (40 MG TOTAL) BY MOUTH DAILY. TAKE 30-60 MIN BEFORE FIRST MEAL OF THE DAY   predniSONE 10 MG tablet Commonly known as:  DELTASONE take 20 mg daily for 5 days then 15 mg daily for 5 days then 10 mg daily for 5 days then 5 mg daily for 5 days   psyllium 58.6 % powder Commonly known as:  METAMUCIL  Take 1 packet by mouth daily.   SYMBICORT 80-4.5 MCG/ACT inhaler Generic drug:  budesonide-formoterol TAKE 2 PUFFS BY MOUTH TWICE A DAY   VENTOLIN HFA 108 (90 Base) MCG/ACT inhaler Generic drug:  albuterol INHALE 1-2 PUFFS INTO THE LUNGS EVERY 4 HOURS AS NEEDED FOR WHEEZING OR SHORTNESS OF BREATH       Past Medical History:  Diagnosis Date  . Asthma   . COPD (chronic obstructive pulmonary disease) ([facility])    reactive airway disease  . Glaucoma   . Gout   . Hx of colonic polyps    Dr Earlean Shawl  . Hyperlipidemia    NMR lipoprofile 2005: LDL 113(1416/825), HLD 37, TG 190.  [name] Kitchen Hypertension   . Pneumonia      Past Surgical History:  Procedure Laterality Date  . ANTERIOR CERVICAL DECOMP/DISCECTOMY FUSION N/A 03/08/2018   Procedure: ANTERIOR CERVICAL DECOMPRESSION/DISCECTOMY FUSION, INTERBODY PROSTHESIS,PLATE/SCREWS CERVICAL FOUR- CERVICAL FIVE, CERVICAL FIVE- CERVICAL SIX, CERVICAL SIX- CERVICAL SEVEN;  Surgeon: Newman Pies, MD;  Location: [place];  Service: Neurosurgery;  Laterality: N/A;  anterior  . CATARACT EXTRACTION     bilat, implants  . CHOLECYSTECTOMY  1995  . LUMBAR LAMINECTOMY  1982  . POLYPECTOMY  2003    neg 2006, Dr.Medoff  . tear duct surgery  1998   for excess tearing, skin graft L foot @ age 23 1/2    Review of systems negative except as noted in HPI / PMHx or noted below:  Review of Systems  Constitutional: Negative.   HENT: Negative.   Eyes: Negative.   Respiratory: Negative.   Cardiovascular: Negative.   Gastrointestinal: Negative.   Genitourinary: Negative.   Musculoskeletal: Negative.   Skin: Negative.   Neurological: Negative.   Endo/Heme/Allergies: Negative.   Psychiatric/Behavioral: Negative.      Objective:   Vitals:   05/04/18 1702  BP: 120/72  Pulse: 93  Resp: 18  Temp: 98.3 F (36.8 C)  SpO2: 94%          Physical Exam  Constitutional:  Coughing  HENT:  Head: Normocephalic.  Right Ear: Tympanic membrane, external ear and ear canal normal.  Left Ear: Tympanic membrane, external ear and ear canal normal.  Nose: Nose normal. No mucosal edema or rhinorrhea.  Mouth/Throat: Uvula is midline, oropharynx is clear and moist and mucous membranes are normal. No oropharyngeal exudate.  Eyes: Conjunctivae are normal.  Neck: Trachea normal. No tracheal tenderness present. No tracheal deviation present. No thyromegaly present.  Cardiovascular: Normal rate, regular rhythm, S1 normal, S2 normal and normal heart sounds.  No murmur heard. Pulmonary/Chest: Breath sounds normal. No stridor. No respiratory distress. He has no wheezes (Inspiratory  crackles posterior lung [place]). He has no rales.  Musculoskeletal: He exhibits no edema.  Lymphadenopathy:       Head (right side): No tonsillar adenopathy present.       Head (left side): No tonsillar adenopathy present.    He has no cervical adenopathy.  Neurological: He is alert.  Skin: No rash noted. He is not diaphoretic. No erythema. Nails show no clubbing.    Diagnostics:    Spirometry was performed and demonstrated an FEV1 of 1.56 at 51 % of predicted.  The patient had an Asthma Control Test with the following results: ACT Total Score: 18.    Results of a high resolution chest CT scan obtained 24 August 2017 identified the following:  Mediastinum/Nodes: No pathologically enlarged mediastinal or hilar lymph nodes. Please note that accurate exclusion of hilar adenopathy is  limited on noncontrast CT scans. Esophagus is unremarkable in appearance. No axillary lymphadenopathy.  Lungs/Pleura: High-resolution images demonstrate patchy areas of peripheral predominant ground-glass attenuation, septal thickening, subpleural reticulation, thickening of the peribronchovascular interstitium, cylindrical bronchiectasis and mild peripheral bronchiolectasis. These findings have a definitive craniocaudal gradient but are somewhat asymmetric involving the right lung to a greater extent than the left. No honeycombing. Amongst the areas of ground-glass attenuation there is one that is somewhat nodular in appearance in the periphery of the left upper lobe (axial image 60 of series 7) measuring 2.0 x 1.4 cm. Inspiratory and expiratory imaging is unremarkable. No consolidative airspace disease. No pleural effusions.  Assessment and Plan:   1. Chronic obstructive pulmonary disease with acute exacerbation ([place])   2. Interstitial lung disease (H[facility])   3. Other allergic rhinitis   4. LPRD (laryngopharyngeal reflux disease)     1. Continue to Treat inflammation:   A. Symbicort 80 - 2  inhalations 2 times per day  B. OTC Nasacort one spray each nostril one time per day  C. Xolair and EpiPen    2. Continue to Treat reflux:   A. Dexilant 60 mg in the a.m.  B. Continue off all caffeine and chocolate  3. If needed:   A. ProAir HFA 2 puffs every 4-6 hours  B. nasal saline washes  C. OTC antihistamine  D. Mucinex DM 2 tablets twice a day  E. Duoneb nebulized every 4-6 hours if needed.  4.  For this recent episode utilize the following:   A. Prednisone 20 mg 1 time per day for 5 days, then 15mg  1 time per day for 5 days, then 10mg  1 time per day for 5 days, then 5mg  1 time per day for 5 days  B. Clindamycin 150 one tablet 3 times per day for 20 days  5. Return to clinic in March 2020 or earlier if problem  6.  Will require a repeat high-resolution chest CT scan in March 2020   It does appear as though Edwin [name] has developed an infectious disease of his airway and we will treat him with a plan that has worked quite well in the past including prolonged systemic steroid use and broad-spectrum antibiotics for a full 20 days.  He obviously has some form of interstitial pneumonitis based upon his CT scan obtained this spring and based on his physical exam today with detection of crackles on auscultation there is probably some significant architectural changes that have occurred within his airway.  We will follow through with the original plan of reassessing the extent of his interstitial pneumonitis with a repeat CT scan 1 year after his original scan.  He will keep in contact with me noting his response to the approach noted above.  If he does well I will see him back in his clinic in March 2020 or earlier if there is a problem.  Allena Katz, MD Allergy / Immunology [place]

## 2018-05-04 NOTE — Patient Instructions (Addendum)
  1. Continue to Treat inflammation:   A. Symbicort 80 - 2 inhalations 2 times per day  B. OTC Nasacort one spray each nostril one time per day  C. Xolair and EpiPen    2. Continue to Treat reflux:   A. Dexilant 60 mg in the a.m.  B. Continue off all caffeine and chocolate  3. If needed:   A. ProAir HFA 2 puffs every 4-6 hours  B. nasal saline washes  C. OTC antihistamine  D. Mucinex DM 2 tablets twice a day  E. Duoneb nebulized every 4-6 hours if needed.  4.  For this recent episode utilize the following:   A. Prednisone 20 mg 1 time per day for 5 days, then 15mg  1 time per day for 5 days, then 10mg  1 time per day for 5 days, then 5mg  1 time per day for 5 days  B. Clindamycin 150 one tablet 3 times per day for 20 days  5. Return to clinic in March 2020 or earlier if problem  6.  Will require a repeat high-resolution chest CT scan in March 2020

## 2018-05-05 ENCOUNTER — Encounter: Payer: Self-pay | Admitting: Allergy and Immunology

## 2018-05-06 ENCOUNTER — Ambulatory Visit: Payer: Medicare Other

## 2018-05-19 DIAGNOSIS — H401132 Primary open-angle glaucoma, bilateral, moderate stage: Secondary | ICD-10-CM | POA: Diagnosis not present

## 2018-05-19 DIAGNOSIS — H53431 Sector or arcuate defects, right eye: Secondary | ICD-10-CM | POA: Diagnosis not present

## 2018-05-19 DIAGNOSIS — J4540 Moderate persistent asthma, uncomplicated: Secondary | ICD-10-CM | POA: Diagnosis not present

## 2018-05-19 DIAGNOSIS — Z961 Presence of intraocular lens: Secondary | ICD-10-CM | POA: Diagnosis not present

## 2018-05-20 ENCOUNTER — Ambulatory Visit (INDEPENDENT_AMBULATORY_CARE_PROVIDER_SITE_OTHER): Payer: Medicare Other | Admitting: *Deleted

## 2018-05-20 DIAGNOSIS — J4540 Moderate persistent asthma, uncomplicated: Secondary | ICD-10-CM | POA: Diagnosis not present

## 2018-05-23 ENCOUNTER — Other Ambulatory Visit: Payer: Self-pay | Admitting: Internal Medicine

## 2018-05-23 DIAGNOSIS — E785 Hyperlipidemia, unspecified: Secondary | ICD-10-CM

## 2018-06-07 DIAGNOSIS — J4540 Moderate persistent asthma, uncomplicated: Secondary | ICD-10-CM

## 2018-06-08 ENCOUNTER — Ambulatory Visit (INDEPENDENT_AMBULATORY_CARE_PROVIDER_SITE_OTHER): Payer: Medicare Other | Admitting: *Deleted

## 2018-06-08 DIAGNOSIS — J4540 Moderate persistent asthma, uncomplicated: Secondary | ICD-10-CM | POA: Diagnosis not present

## 2018-06-09 ENCOUNTER — Other Ambulatory Visit: Payer: Self-pay | Admitting: Internal Medicine

## 2018-06-09 DIAGNOSIS — I10 Essential (primary) hypertension: Secondary | ICD-10-CM

## 2018-06-15 ENCOUNTER — Ambulatory Visit: Payer: Medicare Other | Admitting: Allergy and Immunology

## 2018-06-15 ENCOUNTER — Other Ambulatory Visit: Payer: Self-pay | Admitting: Internal Medicine

## 2018-06-15 DIAGNOSIS — E785 Hyperlipidemia, unspecified: Secondary | ICD-10-CM

## 2018-06-18 ENCOUNTER — Other Ambulatory Visit: Payer: Self-pay | Admitting: Internal Medicine

## 2018-06-18 MED ORDER — PANTOPRAZOLE SODIUM 40 MG PO TBEC: 40.0000 mg | DELAYED_RELEASE_TABLET | Freq: Every day | ORAL | 0 refills | Status: DC

## 2018-06-21 DIAGNOSIS — J4540 Moderate persistent asthma, uncomplicated: Secondary | ICD-10-CM | POA: Diagnosis not present

## 2018-06-22 ENCOUNTER — Ambulatory Visit (INDEPENDENT_AMBULATORY_CARE_PROVIDER_SITE_OTHER): Payer: Medicare Other | Admitting: *Deleted

## 2018-06-22 DIAGNOSIS — J4540 Moderate persistent asthma, uncomplicated: Secondary | ICD-10-CM | POA: Diagnosis not present

## 2018-07-02 DIAGNOSIS — I10 Essential (primary) hypertension: Secondary | ICD-10-CM | POA: Diagnosis not present

## 2018-07-02 DIAGNOSIS — M4722 Other spondylosis with radiculopathy, cervical region: Secondary | ICD-10-CM | POA: Diagnosis not present

## 2018-07-02 DIAGNOSIS — Z6830 Body mass index (BMI) 30.0-30.9, adult: Secondary | ICD-10-CM | POA: Diagnosis not present

## 2018-07-02 DIAGNOSIS — M5020 Other cervical disc displacement, unspecified cervical region: Secondary | ICD-10-CM | POA: Diagnosis not present

## 2018-07-05 DIAGNOSIS — J4540 Moderate persistent asthma, uncomplicated: Secondary | ICD-10-CM | POA: Diagnosis not present

## 2018-07-06 ENCOUNTER — Ambulatory Visit (INDEPENDENT_AMBULATORY_CARE_PROVIDER_SITE_OTHER): Payer: Medicare Other | Admitting: *Deleted

## 2018-07-06 DIAGNOSIS — J4540 Moderate persistent asthma, uncomplicated: Secondary | ICD-10-CM | POA: Diagnosis not present

## 2018-07-17 ENCOUNTER — Other Ambulatory Visit: Payer: Self-pay | Admitting: Internal Medicine

## 2018-07-18 ENCOUNTER — Other Ambulatory Visit: Payer: Self-pay | Admitting: Internal Medicine

## 2018-07-18 DIAGNOSIS — E785 Hyperlipidemia, unspecified: Secondary | ICD-10-CM

## 2018-07-19 DIAGNOSIS — J4540 Moderate persistent asthma, uncomplicated: Secondary | ICD-10-CM

## 2018-07-20 ENCOUNTER — Ambulatory Visit (INDEPENDENT_AMBULATORY_CARE_PROVIDER_SITE_OTHER): Payer: Medicare Other | Admitting: *Deleted

## 2018-07-20 DIAGNOSIS — J4540 Moderate persistent asthma, uncomplicated: Secondary | ICD-10-CM

## 2018-07-27 ENCOUNTER — Encounter: Payer: Self-pay | Admitting: Allergy and Immunology

## 2018-07-27 ENCOUNTER — Ambulatory Visit (INDEPENDENT_AMBULATORY_CARE_PROVIDER_SITE_OTHER): Payer: Medicare Other | Admitting: Allergy and Immunology

## 2018-07-27 VITALS — BP 130/72 | HR 74 | Temp 98.1°F | Resp 18 | Ht 68.0 in | Wt 213.0 lb

## 2018-07-27 DIAGNOSIS — K219 Gastro-esophageal reflux disease without esophagitis: Secondary | ICD-10-CM

## 2018-07-27 DIAGNOSIS — L989 Disorder of the skin and subcutaneous tissue, unspecified: Secondary | ICD-10-CM

## 2018-07-27 DIAGNOSIS — J3089 Other allergic rhinitis: Secondary | ICD-10-CM | POA: Diagnosis not present

## 2018-07-27 DIAGNOSIS — L308 Other specified dermatitis: Secondary | ICD-10-CM

## 2018-07-27 DIAGNOSIS — J441 Chronic obstructive pulmonary disease with (acute) exacerbation: Secondary | ICD-10-CM | POA: Diagnosis not present

## 2018-07-27 DIAGNOSIS — J849 Interstitial pulmonary disease, unspecified: Secondary | ICD-10-CM | POA: Diagnosis not present

## 2018-07-27 MED ORDER — METHYLPREDNISOLONE ACETATE 80 MG/ML IJ SUSP
80.0000 mg | Freq: Once | INTRAMUSCULAR | Status: AC
  Administered 2018-07-27: 80 mg via INTRAMUSCULAR

## 2018-07-27 MED ORDER — MOMETASONE FUROATE 0.1 % EX OINT: TOPICAL_OINTMENT | Freq: Every day | CUTANEOUS | 5 refills | Status: DC

## 2018-07-27 NOTE — Progress Notes (Signed)
Follow-up Note  Referring Provider: Janith Lima, MD Primary Provider: Janith Lima, MD Date of Office Visit: 07/27/2018  Subjective:   Edwin [name] (DOB: 19-Aug-1940) is a 78 y.o. male who returns to the [facility] [place] on 07/27/2018 in re-evaluation of the following:  HPI: Elizar returns to this clinic in evaluation of his COPD with component of asthma and interstitial pneumonitis and allergic rhinitis and reflux.  His last visit to this clinic was 04 May 2018 at which point in time he appeared to have a persistent respiratory tract infection requiring prolonged antibiotics and systemic steroids.  He did quite well with his therapy and was going along without any significant respiratory tract symptoms and with no need to use a short acting bronchodilator and he has joined an exercise program and was doing wonderful with this activity and did not require systemic steroid or antibiotic for any type of respiratory tract issue until this Sunday.  2 days ago he developed a sore throat and then nasal congestion and now he has developed coughing.  He has not had any high fever or ugly nasal discharge or ugly phlegm production or chest pain.  His reflux is under pretty good control.  He is currently using a proton pump inhibitor and H2 receptor blocker.  Over the course of the past several months he has developed an itchy dermatitis on his lower extremity for which he used over-the-counter hydrocortisone with slight response.  Allergies as of 07/27/2018      Reactions   Ramipril    REACTION: chest congestion & cough. No angioedema      Medication List      acyclovir 200 MG capsule Commonly known as:  ZOVIRAX Take 200 mg by mouth 4 (four) times daily as needed (for fever blisters/cold sores.).   ADVIL 200 MG tablet Generic drug:  ibuprofen Take 200 mg by mouth every 6 (six) hours as needed.   allopurinol 300 MG tablet Commonly known as:  ZYLOPRIM TAKE 1  TABLET (300 MG TOTAL) BY MOUTH DAILY.   atorvastatin 20 MG tablet Commonly known as:  LIPITOR TAKE 1 TABLET BY MOUTH EVERY DAY   brimonidine 0.1 % Soln Commonly known as:  ALPHAGAN P Place 1 drop into both eyes 2 (two) times daily. Both eyes   dextromethorphan-guaiFENesin 30-600 MG 12hr tablet Commonly known as:  MUCINEX DM Take 1 tablet by mouth 2 (two) times daily as needed for cough.   EPINEPHrine 0.3 mg/0.3 mL Soaj injection Commonly known as:  EPI-PEN Inject 0.3 mLs (0.3 mg total) into the muscle once.   famotidine 20 MG tablet Commonly known as:  PEPCID One at bedtime   FLUoxetine 10 MG capsule Commonly known as:  PROZAC TAKE 1 CAPSULE (10 MG TOTAL) BY MOUTH DAILY.   gabapentin 300 MG capsule Commonly known as:  NEURONTIN Take 1 capsule (300 mg total) by mouth at bedtime for 7 days.   olmesartan-hydrochlorothiazide 40-12.5 MG tablet Commonly known as:  BENICAR HCT TAKE 0.5 TABLETS BY MOUTH DAILY.   pantoprazole 40 MG tablet Commonly known as:  PROTONIX Take 1 tablet (40 mg total) by mouth daily. Take 30-60 min before first meal of the day   psyllium 58.6 % powder Commonly known as:  METAMUCIL Take 1 packet by mouth daily.   SYMBICORT 80-4.5 MCG/ACT inhaler Generic drug:  budesonide-formoterol TAKE 2 PUFFS BY MOUTH TWICE A DAY   VENTOLIN HFA 108 (90 Base) MCG/ACT inhaler Generic drug:  albuterol  INHALE 1-2 PUFFS INTO THE LUNGS EVERY 4 HOURS AS NEEDED FOR WHEEZING OR SHORTNESS OF BREATH       Past Medical History:  Diagnosis Date  . Asthma   . COPD (chronic obstructive pulmonary disease) (HCC)    reactive airway disease  . Glaucoma   . Gout   . Hx of colonic polyps    Dr Earlean Shawl  . Hyperlipidemia    NMR lipoprofile 2005: LDL 113(1416/825), HLD 37, TG 190.  [name] Kitchen Hypertension   . Pneumonia     Past Surgical History:  Procedure Laterality Date  . ANTERIOR CERVICAL DECOMP/DISCECTOMY FUSION N/A 03/08/2018   Procedure: ANTERIOR CERVICAL  DECOMPRESSION/DISCECTOMY FUSION, INTERBODY PROSTHESIS,PLATE/SCREWS CERVICAL FOUR- CERVICAL FIVE, CERVICAL FIVE- CERVICAL SIX, CERVICAL SIX- CERVICAL SEVEN;  Surgeon: Newman Pies, MD;  Location: [facility];  Service: Neurosurgery;  Laterality: N/A;  anterior  . CATARACT EXTRACTION     bilat, implants  . CHOLECYSTECTOMY  1995  . LUMBAR LAMINECTOMY  1982  . POLYPECTOMY  2003    neg 2006, Dr.Medoff  . tear duct surgery  1998   for excess tearing, skin graft L foot @ age 14 1/2    Review of systems negative except as noted in HPI / PMHx or noted below:  Review of Systems  Constitutional: Negative.   HENT: Negative.   Eyes: Negative.   Respiratory: Negative.   Cardiovascular: Negative.   Gastrointestinal: Negative.   Genitourinary: Negative.   Musculoskeletal: Negative.   Skin: Negative.   Neurological: Negative.   Endo/Heme/Allergies: Negative.   Psychiatric/Behavioral: Negative.      Objective:   Vitals:   07/27/18 1122  BP: 130/72  Pulse: 74  Resp: 18  Temp: 98.1 F (36.7 C)  SpO2: 93%   Height: 5\' 8"  (172.7 cm)  Weight: 213 lb (96.6 kg)   Physical Exam Constitutional:      Appearance: He is not diaphoretic.  HENT:     Head: Normocephalic.     Right Ear: Tympanic membrane, ear canal and external ear normal.     Left Ear: Tympanic membrane, ear canal and external ear normal.     Nose: Mucosal edema (Erythematous) present. No rhinorrhea.     Mouth/Throat:     Pharynx: Uvula midline. Posterior oropharyngeal erythema present. No oropharyngeal exudate.  Eyes:     Conjunctiva/sclera: Conjunctivae normal.  Neck:     Thyroid: No thyromegaly.     Trachea: Trachea normal. No tracheal tenderness or tracheal deviation.  Cardiovascular:     Rate and Rhythm: Normal rate and regular rhythm.     Heart sounds: Normal heart sounds, S1 normal and S2 normal. No murmur.  Pulmonary:     Effort: No respiratory distress.     Breath sounds: Normal breath sounds. No stridor. No  wheezing (Bilateral expiratory wheezes throughout all lung [place]) or rales.  Lymphadenopathy:     Head:     Right side of head: No tonsillar adenopathy.     Left side of head: No tonsillar adenopathy.     Cervical: No cervical adenopathy.  Skin:    Findings: Rash (Multiple lichenified slightly erythematous scaly patches lower extremities shin bilaterally) present. No erythema.     Nails: There is no clubbing.   Neurological:     Mental Status: He is alert.     Diagnostics:    Spirometry was performed and demonstrated an FEV1 of 1.79 at 65 % of predicted.  A DuoNeb nebulization was delivered in clinic today.  The patient had an Asthma  Control Test with the following results: ACT Total Score: 15.    Assessment and Plan:   1. Chronic obstructive pulmonary disease with acute exacerbation ([place])   2. Interstitial lung disease (HCC)   3. Other allergic rhinitis   4. LPRD (laryngopharyngeal reflux disease)   5. Inflammatory dermatosis   6. Interstitial pulmonary disease ([place])     1. Continue to Treat inflammation:   A. Symbicort 80 - 2 inhalations 2 times per day  B. OTC Nasacort one spray each nostril one time per day  C. Xolair and EpiPen  D. Depomedrol 80 mg IM delivered in clinic today  E. Mometasone 0.1% ointment applied to rash daily    2. Continue to Treat reflux:   A.  Pantoprazole 40 mg in a.m. + famotidine 20 mg in the p.m.  B. Continue off all caffeine and chocolate  3. If needed:   A. ProAir HFA 2 puffs every 4-6 hours  B. nasal saline washes  C. OTC antihistamine  D. Mucinex DM 2 tablets twice a day  E. Duoneb nebulized every 4-6 hours if needed.  4. Return to clinic in Summer 2020 or earlier if problem  5.  Will require a repeat high-resolution chest CT scan in late March 2020   [place] appears to have a respiratory tract infection that is giving rise to significant inflammation of his airway for which he will utilize a systemic steroid while he continues  on anti-inflammatory agents for both his upper and lower airway and has a selection of symptomatic medications to choose from if needed.  Hopefully this viral infection will resolve over the course of the next several days and not require any additional evaluation or treatment.  It did appear as though he was doing relatively well on his current plan until this infection.  He also appears to have some form of inflammatory dermatosis affecting his lower extremities for which he can use a topical steroid.  We need to follow-up on his interstitial pneumonitis findings on his abnormal CT scan performed in the past and we will get that study completed at the end of March 2020.  Assuming he does well I will see him back in his clinic in the summer 2020 or earlier if there is a problem.  Allena Katz, MD Allergy / Immunology [place]

## 2018-07-27 NOTE — Patient Instructions (Addendum)
  1. Continue to Treat inflammation:   A. Symbicort 80 - 2 inhalations 2 times per day  B. OTC Nasacort one spray each nostril one time per day  C. Xolair and EpiPen  D. Depomedrol 80 mg IM delivered in clinic today  E. Mometasone 0.1% ointment applied to rash daily    2. Continue to Treat reflux:   A.  Pantoprazole 40 mg in the a.m. + famotidine 20 mg in the p.m.  B. Continue off all caffeine and chocolate  3. If needed:   A. ProAir HFA 2 puffs every 4-6 hours  B. nasal saline washes  C. OTC antihistamine  D. Mucinex DM 2 tablets twice a day  E. Duoneb nebulized every 4-6 hours if needed.  4. Return to clinic in Summer 2020 or earlier if problem  5.  Will require a repeat high-resolution chest CT scan in late March 2020

## 2018-07-28 ENCOUNTER — Encounter: Payer: Self-pay | Admitting: Allergy and Immunology

## 2018-07-28 NOTE — Addendum Note (Signed)
Addended by: Lucrezia Starch I on: 07/28/2018 08:06 AM   Modules accepted: Orders

## 2018-07-29 ENCOUNTER — Telehealth: Payer: Self-pay

## 2018-07-29 NOTE — Telephone Encounter (Signed)
I called patient to advise him of the upcoming appointment for the high res ct scan. He is scheduled for 08-27-2018 at 2 pm [place]. The appointment had to be scheduled after the date of the one he had last year.

## 2018-07-29 NOTE — Telephone Encounter (Signed)
Patient did not answer and I was unable to leave a message.

## 2018-07-30 NOTE — Telephone Encounter (Signed)
Patient advised of appointment. I also sent him an appointment reminder with date, time, address and phone number.

## 2018-08-02 DIAGNOSIS — J4540 Moderate persistent asthma, uncomplicated: Secondary | ICD-10-CM | POA: Diagnosis not present

## 2018-08-03 ENCOUNTER — Ambulatory Visit (INDEPENDENT_AMBULATORY_CARE_PROVIDER_SITE_OTHER): Payer: Medicare Other | Admitting: *Deleted

## 2018-08-03 ENCOUNTER — Ambulatory Visit: Payer: Medicare Other | Admitting: Allergy and Immunology

## 2018-08-03 ENCOUNTER — Other Ambulatory Visit: Payer: Self-pay

## 2018-08-03 ENCOUNTER — Other Ambulatory Visit: Payer: Self-pay | Admitting: Internal Medicine

## 2018-08-03 DIAGNOSIS — E785 Hyperlipidemia, unspecified: Secondary | ICD-10-CM

## 2018-08-03 DIAGNOSIS — J4540 Moderate persistent asthma, uncomplicated: Secondary | ICD-10-CM

## 2018-08-03 MED ORDER — PANTOPRAZOLE SODIUM 40 MG PO TBEC: 40.0000 mg | DELAYED_RELEASE_TABLET | Freq: Every day | ORAL | 5 refills | Status: DC

## 2018-08-03 MED ORDER — ATORVASTATIN CALCIUM 20 MG PO TABS: 20.0000 mg | ORAL_TABLET | Freq: Every day | ORAL | 0 refills | Status: DC

## 2018-08-10 ENCOUNTER — Other Ambulatory Visit: Payer: Self-pay

## 2018-08-10 ENCOUNTER — Ambulatory Visit (INDEPENDENT_AMBULATORY_CARE_PROVIDER_SITE_OTHER): Payer: Medicare Other | Admitting: Allergy and Immunology

## 2018-08-10 VITALS — BP 124/76 | HR 82 | Temp 98.1°F | Resp 18

## 2018-08-10 DIAGNOSIS — J3089 Other allergic rhinitis: Secondary | ICD-10-CM

## 2018-08-10 DIAGNOSIS — J441 Chronic obstructive pulmonary disease with (acute) exacerbation: Secondary | ICD-10-CM

## 2018-08-10 DIAGNOSIS — L308 Other specified dermatitis: Secondary | ICD-10-CM | POA: Diagnosis not present

## 2018-08-10 DIAGNOSIS — L989 Disorder of the skin and subcutaneous tissue, unspecified: Secondary | ICD-10-CM

## 2018-08-10 DIAGNOSIS — K219 Gastro-esophageal reflux disease without esophagitis: Secondary | ICD-10-CM

## 2018-08-10 DIAGNOSIS — J849 Interstitial pulmonary disease, unspecified: Secondary | ICD-10-CM | POA: Diagnosis not present

## 2018-08-10 MED ORDER — PREDNISONE 10 MG PO TABS: ORAL_TABLET | ORAL | 0 refills | Status: AC

## 2018-08-10 MED ORDER — CLINDAMYCIN HCL 150 MG PO CAPS: 150.0000 mg | ORAL_CAPSULE | Freq: Three times a day (TID) | ORAL | 0 refills | Status: AC

## 2018-08-10 NOTE — Patient Instructions (Addendum)
  1. Continue to Treat inflammation:   A. Symbicort 80 - 2 inhalations 2 times per day  B. OTC Nasacort one spray each nostril one time per day  C. Xolair and EpiPen  D. Mometasone 0.1% ointment applied to rash daily    2. Continue to Treat reflux:   A.  Pantoprazole 40 mg in the a.m. + famotidine 20 mg in the p.m.  B. Continue off all caffeine and chocolate  3. If needed:   A. ProAir HFA 2 puffs every 4-6 hours  B. nasal saline washes  C. OTC antihistamine  D. Mucinex DM 2 tablets twice a day  E. Duoneb nebulized every 4-6 hours if needed.  4. For this recent episode:   A.  Clindamycin 150 - 1 tablet 3 times a day for 20 days  B.  Prednisone 10 mg -2 tablets daily x10 days, then 1 tablet daily x10 days  5. Return to clinic in 20 days or earlier if problem  6.  Keep appointment for high-resolution chest CT scan in late March 2020

## 2018-08-10 NOTE — Progress Notes (Signed)
[facility]   Follow-up Note  Referring Provider: Janith Lima, MD Primary Provider: Janith Lima, MD Date of Office Visit: 08/10/2018  Subjective:   Edwin [name] (DOB: 1941-03-23) is a 78 y.o. male who returns to the [facility] and [place] on 08/10/2018 in re-evaluation of the following:  HPI: Dimas returns to this clinic in reevaluation of COPD with component of asthma, interstitial lung disease, allergic rhinitis, and reflux.  His last visit to this clinic was 27 July 2018.  At that point in time he appeared to have developed a viral respiratory tract infection giving rise to significant inflammation of his airway for which we gave him a systemic steroid and he also appeared to have developed a inflammatory dermatosis with pruritic quality on his lower extremities for which we gave him topical mometasone.  He did relatively well for just a few days and then he got worse.  He now has coughing and wheezing and head congestion and ugly nasal discharge and he is not improving at all.  This occurs in the face of utilizing a large collection of medical therapy directed against inflammation and reflux.  He has not been developing any fever or chest pain or sputum production.  Allergies as of 08/10/2018      Reactions   Ramipril    REACTION: chest congestion & cough. No angioedema      Medication List      acyclovir 200 MG capsule Commonly known as:  ZOVIRAX Take 200 mg by mouth 4 (four) times daily as needed (for fever blisters/cold sores.).   Advil 200 MG tablet Generic drug:  ibuprofen Take 200 mg by mouth every 6 (six) hours as needed.   allopurinol 300 MG tablet Commonly known as:  ZYLOPRIM TAKE 1 TABLET (300 MG TOTAL) BY MOUTH DAILY.   atorvastatin 20 MG tablet Commonly known as:  LIPITOR Take 1 tablet (20 mg total) by mouth daily.   brimonidine 0.1 % Soln Commonly known as:  ALPHAGAN P Place 1 drop into  both eyes 2 (two) times daily. Both eyes   dextromethorphan-guaiFENesin 30-600 MG 12hr tablet Commonly known as:  MUCINEX DM Take 1 tablet by mouth 2 (two) times daily as needed for cough.   EPINEPHrine 0.3 mg/0.3 mL Soaj injection Commonly known as:  EPI-PEN Inject 0.3 mLs (0.3 mg total) into the muscle once.   famotidine 20 MG tablet Commonly known as:  Pepcid One at bedtime   FLUoxetine 10 MG capsule Commonly known as:  PROZAC TAKE 1 CAPSULE (10 MG TOTAL) BY MOUTH DAILY.   gabapentin 300 MG capsule Commonly known as:  NEURONTIN Take 1 capsule (300 mg total) by mouth at bedtime for 7 days.   mometasone 0.1 % ointment Commonly known as:  ELOCON Apply topically daily.   olmesartan-hydrochlorothiazide 40-12.5 MG tablet Commonly known as:  BENICAR HCT TAKE 0.5 TABLETS BY MOUTH DAILY.   pantoprazole 40 MG tablet Commonly known as:  PROTONIX Take 1 tablet (40 mg total) by mouth daily. Take 30-60 min before first meal of the day   psyllium 58.6 % powder Commonly known as:  METAMUCIL Take 1 packet by mouth daily.   Symbicort 80-4.5 MCG/ACT inhaler Generic drug:  budesonide-formoterol TAKE 2 PUFFS BY MOUTH TWICE A DAY   Ventolin HFA 108 (90 Base) MCG/ACT inhaler Generic drug:  albuterol INHALE 1-2 PUFFS INTO THE LUNGS EVERY 4 HOURS AS NEEDED FOR WHEEZING OR SHORTNESS OF BREATH  Past Medical History:  Diagnosis Date  . Asthma   . COPD (chronic obstructive pulmonary disease) (HCC)    reactive airway disease  . Glaucoma   . Gout   . Hx of colonic polyps    Dr Earlean Shawl  . Hyperlipidemia    NMR lipoprofile 2005: LDL 113(1416/825), HLD 37, TG 190.  [name] Kitchen Hypertension   . Pneumonia     Past Surgical History:  Procedure Laterality Date  . ANTERIOR CERVICAL DECOMP/DISCECTOMY FUSION N/A 03/08/2018   Procedure: ANTERIOR CERVICAL DECOMPRESSION/DISCECTOMY FUSION, INTERBODY PROSTHESIS,PLATE/SCREWS CERVICAL FOUR- CERVICAL FIVE, CERVICAL FIVE- CERVICAL SIX, CERVICAL SIX-  CERVICAL SEVEN;  Surgeon: Newman Pies, MD;  Location: [facility];  Service: Neurosurgery;  Laterality: N/A;  anterior  . CATARACT EXTRACTION     bilat, implants  . CHOLECYSTECTOMY  1995  . LUMBAR LAMINECTOMY  1982  . POLYPECTOMY  2003    neg 2006, Dr.Medoff  . tear duct surgery  1998   for excess tearing, skin graft L foot @ age 49 1/2    Review of systems negative except as noted in HPI / PMHx or noted below:  Review of Systems  Constitutional: Negative.   HENT: Negative.   Eyes: Negative.   Respiratory: Negative.   Cardiovascular: Negative.   Gastrointestinal: Negative.   Genitourinary: Negative.   Musculoskeletal: Negative.   Skin: Negative.   Neurological: Negative.   Endo/Heme/Allergies: Negative.   Psychiatric/Behavioral: Negative.      Objective:   Vitals:   08/10/18 1006  BP: 124/76  Pulse: 82  Resp: 18  Temp: 98.1 F (36.7 C)  SpO2: 95%          Physical Exam Constitutional:      Appearance: He is not diaphoretic.  HENT:     Head: Normocephalic.     Right Ear: Tympanic membrane, ear canal and external ear normal.     Left Ear: Tympanic membrane, ear canal and external ear normal.     Nose: Nose normal. No mucosal edema or rhinorrhea.     Mouth/Throat:     Pharynx: Uvula midline. No oropharyngeal exudate.  Eyes:     Conjunctiva/sclera: Conjunctivae normal.  Neck:     Thyroid: No thyromegaly.     Trachea: Trachea normal. No tracheal tenderness or tracheal deviation.  Cardiovascular:     Rate and Rhythm: Normal rate and regular rhythm.     Heart sounds: Normal heart sounds, S1 normal and S2 normal. No murmur.  Pulmonary:     Effort: No respiratory distress.     Breath sounds: Normal breath sounds. No stridor. No wheezing (Scattered inspiratory and expiratory wheezes throughout lung [place].  Inferior inspiratory crackles posterior lung field) or rales.  Lymphadenopathy:     Head:     Right side of head: No tonsillar adenopathy.     Left side of  head: No tonsillar adenopathy.     Cervical: No cervical adenopathy.  Skin:    Findings: No erythema or rash.     Nails: There is no clubbing.   Neurological:     Mental Status: He is alert.     Diagnostics: none  Assessment and Plan:   1. Chronic obstructive pulmonary disease with acute exacerbation ([place])   2. Interstitial lung disease (HCC)   3. Other allergic rhinitis   4. LPRD (laryngopharyngeal reflux disease)   5. Inflammatory dermatosis     1. Continue to Treat inflammation:   A. Symbicort 80 - 2 inhalations 2 times per day  B. OTC Nasacort  one spray each nostril one time per day  C. Xolair and EpiPen  D. Mometasone 0.1% ointment applied to rash daily    2. Continue to Treat reflux:   A.  Pantoprazole 40 mg in the a.m. + famotidine 20 mg in the p.m.  B. Continue off all caffeine and chocolate  3. If needed:   A. ProAir HFA 2 puffs every 4-6 hours  B. nasal saline washes  C. OTC antihistamine  D. Mucinex DM 2 tablets twice a day  E. Duoneb nebulized every 4-6 hours if needed.  4. For this recent episode:   A.  Clindamycin 150 - 1 tablet 3 times a day for 20 days  B.  Prednisone 10 mg -2 tablets daily x10 days, then 1 tablet daily x10 days  5. Return to clinic in 20 days or earlier if problem  6.  Keep appointment for high-resolution chest CT scan in late March 2020   I will once again start [place] on a broad-spectrum antibiotic and some systemic steroids utilized for the next 20 days which has been successful in the past. He will continue on a large collection of therapy directed against inflammation and reflux as noted above.  He has an appointment for a high-resolution chest CT scan in investigation of his interstitial lung disease at the end of this month.  He will contact me should he develop significant problems in the face of this plan but otherwise I will see him back in this clinic in 20 days.  Allena Katz, MD Allergy / Immunology [place]

## 2018-08-11 ENCOUNTER — Encounter: Payer: Self-pay | Admitting: Allergy and Immunology

## 2018-08-15 ENCOUNTER — Other Ambulatory Visit: Payer: Self-pay | Admitting: Internal Medicine

## 2018-08-16 DIAGNOSIS — J4540 Moderate persistent asthma, uncomplicated: Secondary | ICD-10-CM

## 2018-08-17 ENCOUNTER — Ambulatory Visit (INDEPENDENT_AMBULATORY_CARE_PROVIDER_SITE_OTHER): Payer: Medicare Other | Admitting: *Deleted

## 2018-08-17 DIAGNOSIS — J4540 Moderate persistent asthma, uncomplicated: Secondary | ICD-10-CM | POA: Diagnosis not present

## 2018-08-18 ENCOUNTER — Other Ambulatory Visit (INDEPENDENT_AMBULATORY_CARE_PROVIDER_SITE_OTHER): Payer: Medicare Other

## 2018-08-18 ENCOUNTER — Encounter: Payer: Self-pay | Admitting: Internal Medicine

## 2018-08-18 ENCOUNTER — Other Ambulatory Visit: Payer: Self-pay

## 2018-08-18 ENCOUNTER — Ambulatory Visit (INDEPENDENT_AMBULATORY_CARE_PROVIDER_SITE_OTHER): Payer: Medicare Other | Admitting: Internal Medicine

## 2018-08-18 VITALS — BP 132/80 | HR 83 | Temp 98.3°F | Ht 68.0 in | Wt 211.8 lb

## 2018-08-18 DIAGNOSIS — R7303 Prediabetes: Secondary | ICD-10-CM

## 2018-08-18 DIAGNOSIS — I10 Essential (primary) hypertension: Secondary | ICD-10-CM

## 2018-08-18 DIAGNOSIS — K219 Gastro-esophageal reflux disease without esophagitis: Secondary | ICD-10-CM | POA: Diagnosis not present

## 2018-08-18 DIAGNOSIS — E785 Hyperlipidemia, unspecified: Secondary | ICD-10-CM | POA: Diagnosis not present

## 2018-08-18 DIAGNOSIS — Z0000 Encounter for general adult medical examination without abnormal findings: Secondary | ICD-10-CM

## 2018-08-18 LAB — CBC WITH DIFFERENTIAL/PLATELET
BASOS PCT: 0.5 % (ref 0.0–3.0)
Basophils Absolute: 0.1 10*3/uL (ref 0.0–0.1)
EOS PCT: 0.5 % (ref 0.0–5.0)
Eosinophils Absolute: 0 10*3/uL (ref 0.0–0.7)
HCT: 44.7 % (ref 39.0–52.0)
Hemoglobin: 15 g/dL (ref 13.0–17.0)
Lymphocytes Relative: 22.4 % (ref 12.0–46.0)
Lymphs Abs: 2.4 10*3/uL (ref 0.7–4.0)
MCHC: 33.5 g/dL (ref 30.0–36.0)
MCV: 92.4 fl (ref 78.0–100.0)
Monocytes Absolute: 0.8 10*3/uL (ref 0.1–1.0)
Monocytes Relative: 7.1 % (ref 3.0–12.0)
Neutro Abs: 7.5 10*3/uL (ref 1.4–7.7)
Neutrophils Relative %: 69.5 % (ref 43.0–77.0)
Platelets: 218 10*3/uL (ref 150.0–400.0)
RBC: 4.84 Mil/uL (ref 4.22–5.81)
RDW: 16.2 % — ABNORMAL HIGH (ref 11.5–15.5)
WBC: 10.8 10*3/uL — AB (ref 4.0–10.5)

## 2018-08-18 LAB — COMPREHENSIVE METABOLIC PANEL
ALK PHOS: 61 U/L (ref 39–117)
ALT: 21 U/L (ref 0–53)
AST: 19 U/L (ref 0–37)
Albumin: 4.4 g/dL (ref 3.5–5.2)
BUN: 23 mg/dL (ref 6–23)
CO2: 32 mEq/L (ref 19–32)
Calcium: 9.4 mg/dL (ref 8.4–10.5)
Chloride: 98 mEq/L (ref 96–112)
Creatinine, Ser: 1.15 mg/dL (ref 0.40–1.50)
GFR: 61.65 mL/min (ref >60.00)
Glucose, Bld: 125 mg/dL — ABNORMAL HIGH (ref 70–99)
Potassium: 3.9 mEq/L (ref 3.5–5.1)
Sodium: 138 mEq/L (ref 135–145)
Total Bilirubin: 0.7 mg/dL (ref 0.2–1.2)
Total Protein: 7.1 g/dL (ref 6.0–8.3)

## 2018-08-18 LAB — TSH: TSH: 1.62 u[IU]/mL (ref 0.35–4.50)

## 2018-08-18 LAB — LIPID PANEL
CHOLESTEROL: 132 mg/dL (ref 0–200)
HDL: 60.1 mg/dL (ref >39.00)
LDL Cholesterol: 33 mg/dL (ref 0–99)
NonHDL: 72.33
Total CHOL/HDL Ratio: 2
Triglycerides: 197 mg/dL — ABNORMAL HIGH (ref 0.0–149.0)
VLDL: 39.4 mg/dL (ref 0.0–40.0)

## 2018-08-18 LAB — HEMOGLOBIN A1C: Hgb A1c MFr Bld: 6.3 % (ref 4.6–6.5)

## 2018-08-18 NOTE — Progress Notes (Signed)
Subjective:  Patient ID: Edwin [name], male    DOB: [birth year]  Age: 78 y.o. MRN: [id number]  CC: Annual Exam; Hypertension; and Hyperlipidemia   HPI Edwin [name] presents for a CPX.  He is being treated for a URI by pulmonology.  He tells me he is taken an antibiotic and steroids.  He otherwise feels well and offers no other complaints.  Past Medical History:  Diagnosis Date  . Asthma   . COPD (chronic obstructive pulmonary disease) (HCC)    reactive airway disease  . Glaucoma   . Gout   . Hx of colonic polyps    Dr Earlean Shawl  . Hyperlipidemia    NMR lipoprofile 2005: LDL 113(1416/825), HLD 37, TG 190.  [name] Kitchen Hypertension   . Pneumonia    Past Surgical History:  Procedure Laterality Date  . ANTERIOR CERVICAL DECOMP/DISCECTOMY FUSION N/A 03/08/2018   Procedure: ANTERIOR CERVICAL DECOMPRESSION/DISCECTOMY FUSION, INTERBODY PROSTHESIS,PLATE/SCREWS CERVICAL FOUR- CERVICAL FIVE, CERVICAL FIVE- CERVICAL SIX, CERVICAL SIX- CERVICAL SEVEN;  Surgeon: Newman Pies, MD;  Location: [facility];  Service: Neurosurgery;  Laterality: N/A;  anterior  . CATARACT EXTRACTION     bilat, implants  . CHOLECYSTECTOMY  1995  . LUMBAR LAMINECTOMY  1982  . POLYPECTOMY  2003    neg 2006, Dr.Medoff  . tear duct surgery  1998   for excess tearing, skin graft L foot @ age 81 1/2    reports that he quit smoking about 35 years ago. His smoking use included cigarettes. He has a 50.00 pack-year smoking history. He has never used smokeless tobacco. He reports that he does not drink alcohol or use drugs. family history includes Asthma in his father and son; Cancer in his mother; Emphysema in his father; Heart attack (age of onset: 85) in his father; Hypertension in his mother. Allergies  Allergen Reactions  . Ramipril     REACTION: chest congestion & cough. No angioedema    Outpatient Medications Prior to Visit  Medication Sig Dispense Refill  . acyclovir (ZOVIRAX) 200 MG capsule Take 200 mg by mouth 4  (four) times daily as needed (for fever blisters/cold sores.).    [name] Kitchen allopurinol (ZYLOPRIM) 300 MG tablet TAKE 1 TABLET (300 MG TOTAL) BY MOUTH DAILY. (Patient taking differently: Take 300 mg by mouth daily. ) 90 tablet 1  . atorvastatin (LIPITOR) 20 MG tablet Take 1 tablet (20 mg total) by mouth daily. 30 tablet 0  . brimonidine (ALPHAGAN P) 0.1 % SOLN Place 1 drop into both eyes 2 (two) times daily. Both eyes     . clindamycin (CLEOCIN) 150 MG capsule Take 1 capsule (150 mg total) by mouth 3 (three) times daily for 20 days. 60 capsule 0  . dextromethorphan-guaiFENesin (MUCINEX DM) 30-600 MG 12hr tablet Take 1 tablet by mouth 2 (two) times daily as needed for cough.     . EPINEPHrine 0.3 mg/0.3 mL IJ SOAJ injection Inject 0.3 mLs (0.3 mg total) into the muscle once. (Patient taking differently: Inject 0.3 mg into the muscle daily as needed (anaphylactic allergic reactions). ) 2 Device 1  . famotidine (PEPCID) 20 MG tablet TAKE 1 TABLET BY MOUTH EVERYDAY AT BEDTIME 90 tablet 3  . FLUoxetine (PROZAC) 10 MG capsule TAKE 1 CAPSULE (10 MG TOTAL) BY MOUTH DAILY. 90 capsule 1  . ibuprofen (ADVIL) 200 MG tablet Take 200 mg by mouth every 6 (six) hours as needed.    [name] Kitchen LUMIGAN 0.01 % SOLN     . mometasone (ELOCON) 0.1 %  ointment Apply topically daily. 45 g 5  . olmesartan-hydrochlorothiazide (BENICAR HCT) 40-12.5 MG tablet TAKE 0.5 TABLETS BY MOUTH DAILY. 45 tablet 0  . pantoprazole (PROTONIX) 40 MG tablet Take 1 tablet (40 mg total) by mouth daily. Take 30-60 min before first meal of the day 30 tablet 5  . predniSONE (DELTASONE) 10 MG tablet Take 2 tablets (20 mg total) by mouth daily with breakfast for 10 days, THEN 1 tablet (10 mg total) daily with breakfast for 10 days. 30 tablet 0  . psyllium (METAMUCIL) 58.6 % powder Take 1 packet by mouth daily.      . SYMBICORT 80-4.5 MCG/ACT inhaler TAKE 2 PUFFS BY MOUTH TWICE A DAY 30.6 Inhaler 1  . VENTOLIN HFA 108 (90 Base) MCG/ACT inhaler INHALE 1-2 PUFFS INTO  THE LUNGS EVERY 4 HOURS AS NEEDED FOR WHEEZING OR SHORTNESS OF BREATH (Patient taking differently: Inhale 1-2 puffs into the lungs every 4 (four) hours as needed for wheezing or shortness of breath. ) 1 Inhaler 2  . gabapentin (NEURONTIN) 300 MG capsule Take 1 capsule (300 mg total) by mouth at bedtime for 7 days. (Patient taking differently: Take 300 mg by mouth 3 (three) times daily. ) 7 capsule 0   Facility-Administered Medications Prior to Visit  Medication Dose Route Frequency Provider Last Rate Last Dose  . omalizumab Arvid Right) injection 375 mg  375 mg Subcutaneous Q14 Days Kozlow, Donnamarie Poag, MD   375 mg at 08/17/18 1029    ROS Review of Systems  Constitutional: Negative for chills, fatigue and fever.  HENT: Positive for congestion, postnasal drip and rhinorrhea. Negative for sinus pressure, sinus pain, sore throat and trouble swallowing.   Eyes: Negative.   Respiratory: Positive for cough and wheezing. Negative for chest tightness and shortness of breath.   Cardiovascular: Negative for chest pain, palpitations and leg swelling.  Gastrointestinal: Negative for abdominal pain, constipation, diarrhea, nausea and vomiting.  Endocrine: Negative.   Genitourinary: Negative.  Negative for difficulty urinating and dysuria.  Musculoskeletal: Negative.  Negative for arthralgias, back pain, myalgias and neck pain.  Skin: Negative.  Negative for color change and pallor.  Neurological: Negative.  Negative for dizziness, weakness, light-headedness and headaches.  Hematological: Negative for adenopathy. Does not bruise/bleed easily.  Psychiatric/Behavioral: Negative.     Objective:  BP 132/80 (BP Location: Left Arm, Patient Position: Sitting, Cuff Size: Normal)   Pulse 83   Temp 98.3 F (36.8 C) (Oral)   Ht 5\' 8"  (1.727 m)   Wt 211 lb 12 oz (96 kg)   SpO2 94%   BMI 32.20 kg/m   BP Readings from Last 3 Encounters:  08/18/18 132/80  08/10/18 124/76  07/27/18 130/72    Wt Readings from Last  3 Encounters:  08/18/18 211 lb 12 oz (96 kg)  07/27/18 213 lb (96.6 kg)  03/08/18 212 lb (96.2 kg)    Physical Exam Vitals signs reviewed.  Constitutional:      Appearance: He is not ill-appearing or diaphoretic.  HENT:     Nose: Nose normal. No congestion or rhinorrhea.     Mouth/Throat:     Mouth: Mucous membranes are moist.     Pharynx: Oropharynx is clear. No oropharyngeal exudate or posterior oropharyngeal erythema.  Eyes:     General: No scleral icterus.    Conjunctiva/sclera: Conjunctivae normal.  Neck:     Musculoskeletal: Normal range of motion and neck supple. No muscular tenderness.  Cardiovascular:     Rate and Rhythm: Normal rate and regular  rhythm.     Heart sounds: No murmur. No gallop.   Pulmonary:     Effort: Pulmonary effort is normal. No respiratory distress.     Breath sounds: No stridor. Examination of the left-lower field reveals wheezing. Wheezing present. No decreased breath sounds, rhonchi or rales.  Abdominal:     General: Bowel sounds are normal.     Palpations: There is no hepatomegaly, splenomegaly or mass.     Tenderness: There is no abdominal tenderness.  Musculoskeletal: Normal range of motion.        General: No swelling.     Right lower leg: No edema.     Left lower leg: No edema.  Lymphadenopathy:     Cervical: No cervical adenopathy.  Skin:    General: Skin is warm and dry.     Coloration: Skin is not pale.     Findings: No erythema or rash.  Neurological:     General: No focal deficit present.     Mental Status: He is oriented to person, place, and time. Mental status is at baseline.     Lab Results  Component Value Date   WBC 10.8 (H) 08/18/2018   HGB 15.0 08/18/2018   HCT 44.7 08/18/2018   PLT 218.0 08/18/2018   GLUCOSE 125 (H) 08/18/2018   CHOL 132 08/18/2018   TRIG 197.0 (H) 08/18/2018   HDL 60.10 08/18/2018   LDLDIRECT 102.0 03/03/2016   LDLCALC 33 08/18/2018   ALT 21 08/18/2018   AST 19 08/18/2018   NA 138  08/18/2018   K 3.9 08/18/2018   CL 98 08/18/2018   CREATININE 1.15 08/18/2018   BUN 23 08/18/2018   CO2 32 08/18/2018   TSH 1.62 08/18/2018   PSA 0.87 05/05/2016   HGBA1C 6.3 08/18/2018    No results found.  Assessment & Plan:   Rodriguez was seen today for annual exam, hypertension and hyperlipidemia.  Diagnoses and all orders for this visit:  Essential hypertension- His blood pressure is adequately well controlled.  Electrolytes and renal function are normal. -     CBC with Differential/Platelet; Future -     Comprehensive metabolic panel; Future  Hyperlipidemia with target LDL less than 130- He has achieved his LDL goal and is doing well on the statin. -     Lipid panel; Future -     TSH; Future  Routine general medical examination at a health care facility  Gastroesophageal reflux disease, esophagitis presence not specified- His symptoms are well controlled. -     CBC with Differential/Platelet; Future  Prediabetes- His A1c is up to 6.3%.  He is prediabetic.  Medical therapy is not indicated.  He was encouraged to continue working on his lifestyle modifications. -     Comprehensive metabolic panel; Future -     Hemoglobin A1c; Future   I am having Edwin [name] maintain his brimonidine, psyllium, dextromethorphan-guaiFENesin, EPINEPHrine, Ventolin HFA, allopurinol, gabapentin, acyclovir, ibuprofen, FLUoxetine, Symbicort, olmesartan-hydrochlorothiazide, mometasone, pantoprazole, atorvastatin, clindamycin, predniSONE, famotidine, and Lumigan. We will continue to administer omalizumab.  No orders of the defined types were placed in this encounter.  See AVS for instructions about healthy living and anticipatory guidance.  Follow-up: No follow-ups on file.  Scarlette Calico, MD

## 2018-08-19 ENCOUNTER — Encounter: Payer: Self-pay | Admitting: Internal Medicine

## 2018-08-19 NOTE — Assessment & Plan Note (Signed)

## 2018-08-26 ENCOUNTER — Other Ambulatory Visit: Payer: Self-pay | Admitting: Internal Medicine

## 2018-08-26 DIAGNOSIS — E785 Hyperlipidemia, unspecified: Secondary | ICD-10-CM

## 2018-08-27 ENCOUNTER — Other Ambulatory Visit: Payer: Medicare Other

## 2018-08-30 DIAGNOSIS — J4540 Moderate persistent asthma, uncomplicated: Secondary | ICD-10-CM | POA: Diagnosis not present

## 2018-08-31 ENCOUNTER — Ambulatory Visit (INDEPENDENT_AMBULATORY_CARE_PROVIDER_SITE_OTHER): Payer: Medicare Other | Admitting: *Deleted

## 2018-08-31 ENCOUNTER — Ambulatory Visit: Payer: Medicare Other

## 2018-08-31 ENCOUNTER — Ambulatory Visit: Payer: Medicare Other | Admitting: Allergy and Immunology

## 2018-08-31 DIAGNOSIS — J4540 Moderate persistent asthma, uncomplicated: Secondary | ICD-10-CM

## 2018-09-04 ENCOUNTER — Other Ambulatory Visit: Payer: Self-pay | Admitting: Internal Medicine

## 2018-09-04 DIAGNOSIS — I10 Essential (primary) hypertension: Secondary | ICD-10-CM

## 2018-09-14 ENCOUNTER — Ambulatory Visit: Payer: Self-pay

## 2018-09-14 DIAGNOSIS — J4540 Moderate persistent asthma, uncomplicated: Secondary | ICD-10-CM | POA: Diagnosis not present

## 2018-09-15 ENCOUNTER — Other Ambulatory Visit: Payer: Self-pay

## 2018-09-15 ENCOUNTER — Ambulatory Visit (INDEPENDENT_AMBULATORY_CARE_PROVIDER_SITE_OTHER): Payer: Medicare Other

## 2018-09-15 DIAGNOSIS — J4540 Moderate persistent asthma, uncomplicated: Secondary | ICD-10-CM | POA: Diagnosis not present

## 2018-09-27 DIAGNOSIS — K219 Gastro-esophageal reflux disease without esophagitis: Secondary | ICD-10-CM | POA: Diagnosis not present

## 2018-09-27 DIAGNOSIS — J449 Chronic obstructive pulmonary disease, unspecified: Secondary | ICD-10-CM | POA: Diagnosis not present

## 2018-09-27 DIAGNOSIS — J849 Interstitial pulmonary disease, unspecified: Secondary | ICD-10-CM | POA: Diagnosis not present

## 2018-09-27 DIAGNOSIS — J324 Chronic pansinusitis: Secondary | ICD-10-CM | POA: Diagnosis not present

## 2018-09-27 DIAGNOSIS — B370 Candidal stomatitis: Secondary | ICD-10-CM | POA: Diagnosis not present

## 2018-09-27 DIAGNOSIS — J3089 Other allergic rhinitis: Secondary | ICD-10-CM | POA: Diagnosis not present

## 2018-09-27 DIAGNOSIS — J4540 Moderate persistent asthma, uncomplicated: Secondary | ICD-10-CM | POA: Diagnosis not present

## 2018-09-28 ENCOUNTER — Ambulatory Visit: Payer: Medicare Other

## 2018-09-28 ENCOUNTER — Ambulatory Visit: Payer: Self-pay

## 2018-09-28 ENCOUNTER — Encounter: Payer: Self-pay | Admitting: Allergy and Immunology

## 2018-09-28 ENCOUNTER — Ambulatory Visit (INDEPENDENT_AMBULATORY_CARE_PROVIDER_SITE_OTHER): Payer: Medicare Other | Admitting: Allergy and Immunology

## 2018-09-28 ENCOUNTER — Other Ambulatory Visit: Payer: Self-pay

## 2018-09-28 VITALS — BP 118/78 | HR 71 | Temp 98.7°F | Resp 16

## 2018-09-28 DIAGNOSIS — K219 Gastro-esophageal reflux disease without esophagitis: Secondary | ICD-10-CM | POA: Diagnosis not present

## 2018-09-28 DIAGNOSIS — J849 Interstitial pulmonary disease, unspecified: Secondary | ICD-10-CM | POA: Diagnosis not present

## 2018-09-28 DIAGNOSIS — J3089 Other allergic rhinitis: Secondary | ICD-10-CM | POA: Diagnosis not present

## 2018-09-28 DIAGNOSIS — J324 Chronic pansinusitis: Secondary | ICD-10-CM

## 2018-09-28 DIAGNOSIS — B370 Candidal stomatitis: Secondary | ICD-10-CM

## 2018-09-28 DIAGNOSIS — J449 Chronic obstructive pulmonary disease, unspecified: Secondary | ICD-10-CM | POA: Diagnosis not present

## 2018-09-28 DIAGNOSIS — J4540 Moderate persistent asthma, uncomplicated: Secondary | ICD-10-CM

## 2018-09-28 DIAGNOSIS — J4489 Other specified chronic obstructive pulmonary disease: Secondary | ICD-10-CM

## 2018-09-28 MED ORDER — FLUCONAZOLE 150 MG PO TABS: 150.0000 mg | ORAL_TABLET | Freq: Every day | ORAL | 0 refills | Status: DC

## 2018-09-28 MED ORDER — ALBUTEROL SULFATE HFA 108 (90 BASE) MCG/ACT IN AERS: INHALATION_SPRAY | RESPIRATORY_TRACT | 2 refills | Status: DC

## 2018-09-28 MED ORDER — BUDESONIDE-FORMOTEROL FUMARATE 80-4.5 MCG/ACT IN AERO: INHALATION_SPRAY | RESPIRATORY_TRACT | 1 refills | Status: DC

## 2018-09-28 NOTE — Progress Notes (Signed)
[place]   Follow-up Note  Referring Provider: Janith Lima, MD Primary Provider: Janith Lima, MD Date of Office Visit: 09/28/2018  Subjective:   Edwin [name] (DOB: 1940-08-30) is a 78 y.o. male who returns to the [facility] on 09/[facility]/2018 in re-evaluation of the following:  HPI: Edwin [name] returns to this clinic in reevaluation of COPD with component of asthma, interstitial lung disease, allergic rhinitis and chronic sinusitis, reflux, and a history of pruritic inflammatory dermatosis.  His last visit to this clinic was 10 August 2018.  At that point in time he appeared to have both an upper and lower airway respiratory tract flare for which we treated him with broad-spectrum antibiotics for 20 days using clindamycin and a relatively low dose of prednisone extended out over 20 days.  He did much better with that treatment and in fact he feels as though his breathing is significantly improved yet he still continues to have a little bit of a chronic cough and some clear phlegm production and still gets very dyspneic if he walks upstairs.  He must rest at the top of stairs.  He does not really use a short acting bronchodilator but he consistently uses his Symbicort.  He has noticed that he has developed some issues with his throat recently.  His throat feels really irritated and he has to do a lot of throat clearing.  When he swallows his throat feels "tight".  This appeared to occur soon after completing his antibiotics and systemic steroids.  His nose is doing relatively well at this point in time.  He does not have any ugly nasal discharge or head congestion or nasal congestion.  He believes that his reflux is under very good control at this point in time.  He is only using a proton pump inhibitor and is not using a H2 receptor blocker.  Occasionally has an intermittent regurgitation event a few times a week.  He has had  no problems with his skin. He uses his topical steroid about one time per week to his shins  Unfortunately, because of the coronavirus pandemic his high resolution chest CT scan has been postponed until May.  Allergies as of 09/28/2018      Reactions   Ramipril    REACTION: chest congestion & cough. No angioedema      Medication List      acyclovir 200 MG capsule Commonly known as:  ZOVIRAX Take 200 mg by mouth 4 (four) times daily as needed (for fever blisters/cold sores.).   Advil 200 MG tablet Generic drug:  ibuprofen Take 200 mg by mouth every 6 (six) hours as needed.   allopurinol 300 MG tablet Commonly known as:  ZYLOPRIM Take 1 tablet (300 mg total) by mouth daily.   atorvastatin 20 MG tablet Commonly known as:  LIPITOR TAKE 1 TABLET BY MOUTH EVERY DAY   brimonidine 0.1 % Soln Commonly known as:  ALPHAGAN P Place 1 drop into both eyes 2 (two) times daily. Both eyes   dextromethorphan-guaiFENesin 30-600 MG 12hr tablet Commonly known as:  MUCINEX DM Take 1 tablet by mouth 2 (two) times daily as needed for cough.   EPINEPHrine 0.3 mg/0.3 mL Soaj injection Commonly known as:  EPI-PEN Inject 0.3 mLs (0.3 mg total) into the muscle once.   famotidine 20 MG tablet Commonly known as:  PEPCID TAKE 1 TABLET BY MOUTH EVERYDAY AT BEDTIME   FLUoxetine 10 MG  capsule Commonly known as:  PROZAC TAKE 1 CAPSULE (10 MG TOTAL) BY MOUTH DAILY.   gabapentin 300 MG capsule Commonly known as:  NEURONTIN Take 1 capsule (300 mg total) by mouth at bedtime for 7 days.   Lumigan 0.01 % Soln Generic drug:  bimatoprost   mometasone 0.1 % ointment Commonly known as:  ELOCON Apply topically daily.   olmesartan-hydrochlorothiazide 40-12.5 MG tablet Commonly known as:  BENICAR HCT TAKE 1/2 TABLET BY MOUTH EVERY DAY   pantoprazole 40 MG tablet Commonly known as:  PROTONIX Take 1 tablet (40 mg total) by mouth daily. Take 30-60 min before first meal of the day   psyllium 58.6 %  powder Commonly known as:  METAMUCIL Take 1 packet by mouth daily.   Symbicort 80-4.5 MCG/ACT inhaler Generic drug:  budesonide-formoterol TAKE 2 PUFFS BY MOUTH TWICE A DAY   Ventolin HFA 108 (90 Base) MCG/ACT inhaler Generic drug:  albuterol INHALE 1-2 PUFFS INTO THE LUNGS EVERY 4 HOURS AS NEEDED FOR WHEEZING OR SHORTNESS OF BREATH       Past Medical History:  Diagnosis Date  . Asthma   . COPD (chronic obstructive pulmonary disease) (HCC)    reactive airway disease  . Glaucoma   . Gout   . Hx of colonic polyps    Dr Earlean Shawl  . Hyperlipidemia    NMR lipoprofile 2005: LDL 113(1416/825), HLD 37, TG 190.  [name] Kitchen Hypertension   . Pneumonia     Past Surgical History:  Procedure Laterality Date  . ANTERIOR CERVICAL DECOMP/DISCECTOMY FUSION N/A 03/08/2018   Procedure: ANTERIOR CERVICAL DECOMPRESSION/DISCECTOMY FUSION, INTERBODY PROSTHESIS,PLATE/SCREWS CERVICAL FOUR- CERVICAL FIVE, CERVICAL FIVE- CERVICAL SIX, CERVICAL SIX- CERVICAL SEVEN;  Surgeon: Newman Pies, MD;  Location: [facility];  Service: Neurosurgery;  Laterality: N/A;  anterior  . CATARACT EXTRACTION     bilat, implants  . CHOLECYSTECTOMY  1995  . LUMBAR LAMINECTOMY  1982  . POLYPECTOMY  2003    neg 2006, Dr.Medoff  . tear duct surgery  1998   for excess tearing, skin graft L foot @ age 40 1/2    Review of systems negative except as noted in HPI / PMHx or noted below:  Review of Systems  Constitutional: Negative.   HENT: Negative.   Eyes: Negative.   Respiratory: Negative.   Cardiovascular: Negative.   Gastrointestinal: Negative.   Genitourinary: Negative.   Musculoskeletal: Negative.   Skin: Negative.   Neurological: Negative.   Endo/Heme/Allergies: Negative.   Psychiatric/Behavioral: Negative.      Objective:   Vitals:   09/28/18 1625  BP: 118/78  Pulse: 71  Resp: 16  Temp: 98.7 F (37.1 C)  SpO2: 92%          Physical Exam Constitutional:      Appearance: He is not diaphoretic.      Comments: Throat clearing, slightly raspy voice  HENT:     Head: Normocephalic.     Right Ear: Tympanic membrane, ear canal and external ear normal.     Left Ear: Tympanic membrane, ear canal and external ear normal.     Nose: Nose normal. No mucosal edema or rhinorrhea.     Mouth/Throat:     Pharynx: Uvula midline. Posterior oropharyngeal erythema present. No oropharyngeal exudate.  Eyes:     Conjunctiva/sclera: Conjunctivae normal.  Neck:     Thyroid: No thyromegaly.     Trachea: Trachea normal. No tracheal tenderness or tracheal deviation.  Cardiovascular:     Rate and Rhythm: Normal rate and regular  rhythm.     Heart sounds: Normal heart sounds, S1 normal and S2 normal. No murmur.  Pulmonary:     Effort: No respiratory distress.     Breath sounds: Normal breath sounds. No stridor. No wheezing (Scattered expiratory wheezes posterior lung Zinn) or rales.  Lymphadenopathy:     Head:     Right side of head: No tonsillar adenopathy.     Left side of head: No tonsillar adenopathy.     Cervical: No cervical adenopathy.  Skin:    Findings: No erythema or rash.     Nails: There is no clubbing.   Neurological:     Mental Status: He is alert.     Diagnostics:    Spirometry was performed and demonstrated an FEV1 of 1.89 at 69 % of predicted.  Results of blood tests obtained 18 August 2018 identified WBC 10.8, absolute eosinophil 0, absolute lymphocyte 2400, hemoglobin 15.0, platelet 218.  Oxygen saturation was 97% on room air at rest.  With walking up and down the hallway for 3 minutes his oxygen saturation dropped to 89% on room air.  Assessment and Plan:   1. COPD with asthma ([place])   2. Interstitial lung disease ([place])   3. Chronic pansinusitis   4. Other allergic rhinitis   5. LPRD (laryngopharyngeal reflux disease)   6. Thrush     1. Continue to Treat inflammation:   A. Symbicort 80 - 2 inhalations 2 times per day  B. OTC Nasacort one spray each nostril one time per  day  C. Xolair injections and EpiPen    2. Continue to Treat reflux:   A.  Pantoprazole 40 mg in the a.m. + famotidine 20 mg in the p.m.  3. Treat fungal infection:   A. Diflucan 150 mg tablet today  4. If needed:   A. ProAir HFA 2 puffs every 4-6 hours  B. nasal saline washes  C. OTC antihistamine  D. Mucinex DM 2 tablets twice a day  E. Duoneb nebulized every 4-6 hours if needed.  F. Mometasone 0.1% ointment applied to rash daily  5. Need a 6 minute walk exercise test with pulmonary lab to look at exercise induced oxygen deficit.   5. Need HRCT in May or whenever it can be scheduled.  6. Return to clinic in 12 weeks or earlier if problem     Anes is stable with stability defined by continued chronic respiratory tract symptoms and significant dyspnea on exertion and an active process within [place]'s lungs that appear to be affecting his ability to oxygenate adequately with exercise.  We will obtain his HRCT when the coronavirus pandemic allows [place] to do so and we need to repeat his 6-minute exercise test to look at oxygenation.  Apparently he had one of these test performed by Dr. Melvyn Novas last year.  He will remain on a very large collection of anti-inflammatory agents for his airway and therapy directed against reflux as noted above.  I did give him a Diflucan with the assumption that some of his throat issue is secondary to fungal overgrowth from his systemic steroid and antibiotic use.  I will see him back in this clinic in 12 weeks or earlier if there is a problem.  Allena Katz, MD [facility]

## 2018-09-28 NOTE — Patient Instructions (Addendum)
  1. Continue to Treat inflammation:   A. Symbicort 80 - 2 inhalations 2 times per day  B. OTC Nasacort one spray each nostril one time per day  C. Xolair injections and EpiPen    2. Continue to Treat reflux:   A.  Pantoprazole 40 mg in the a.m. + famotidine 20 mg in the p.m.  3. Treat fungal infection:   A. Diflucan 150 mg tablet today  4. If needed:   A. ProAir HFA 2 puffs every 4-6 hours  B. nasal saline washes  C. OTC antihistamine  D. Mucinex DM 2 tablets twice a day  E. Duoneb nebulized every 4-6 hours if needed.  F. Mometasone 0.1% ointment applied to rash daily  5. Need a 6 minute walk exercise test with pulmonary lab to look at exercise induced oxygen deficit.   5. Need HRCT in May or whenever it can be scheduled.  6. Return to clinic in 12 weeks or earlier if problem

## 2018-09-29 ENCOUNTER — Encounter: Payer: Self-pay | Admitting: Allergy and Immunology

## 2018-10-04 ENCOUNTER — Other Ambulatory Visit: Payer: Self-pay | Admitting: Internal Medicine

## 2018-10-12 ENCOUNTER — Other Ambulatory Visit: Payer: Self-pay | Admitting: *Deleted

## 2018-10-12 DIAGNOSIS — J4540 Moderate persistent asthma, uncomplicated: Secondary | ICD-10-CM

## 2018-10-12 DIAGNOSIS — J849 Interstitial pulmonary disease, unspecified: Secondary | ICD-10-CM

## 2018-10-13 ENCOUNTER — Ambulatory Visit (INDEPENDENT_AMBULATORY_CARE_PROVIDER_SITE_OTHER): Payer: Medicare Other | Admitting: *Deleted

## 2018-10-13 DIAGNOSIS — J4540 Moderate persistent asthma, uncomplicated: Secondary | ICD-10-CM | POA: Diagnosis not present

## 2018-10-19 ENCOUNTER — Other Ambulatory Visit: Payer: Medicare Other

## 2018-10-26 ENCOUNTER — Ambulatory Visit: Payer: Medicare Other

## 2018-10-27 DIAGNOSIS — J4540 Moderate persistent asthma, uncomplicated: Secondary | ICD-10-CM | POA: Diagnosis not present

## 2018-10-28 ENCOUNTER — Ambulatory Visit (INDEPENDENT_AMBULATORY_CARE_PROVIDER_SITE_OTHER): Payer: Medicare Other

## 2018-10-28 ENCOUNTER — Other Ambulatory Visit: Payer: Self-pay

## 2018-10-28 DIAGNOSIS — J4540 Moderate persistent asthma, uncomplicated: Secondary | ICD-10-CM

## 2018-11-03 ENCOUNTER — Ambulatory Visit
Admission: RE | Admit: 2018-11-03 | Discharge: 2018-11-03 | Disposition: A | Discharge status: Home / Self Care | Payer: Medicare Other | Source: Ambulatory Visit | Attending: Allergy and Immunology | Admitting: Allergy and Immunology

## 2018-11-03 DIAGNOSIS — J439 Emphysema, unspecified: Secondary | ICD-10-CM | POA: Diagnosis not present

## 2018-11-03 DIAGNOSIS — J849 Interstitial pulmonary disease, unspecified: Secondary | ICD-10-CM

## 2018-11-03 DIAGNOSIS — J984 Other disorders of lung: Secondary | ICD-10-CM | POA: Diagnosis not present

## 2018-11-03 DIAGNOSIS — J986 Disorders of diaphragm: Secondary | ICD-10-CM | POA: Diagnosis not present

## 2018-11-09 DIAGNOSIS — J4540 Moderate persistent asthma, uncomplicated: Secondary | ICD-10-CM | POA: Diagnosis not present

## 2018-11-10 ENCOUNTER — Ambulatory Visit (INDEPENDENT_AMBULATORY_CARE_PROVIDER_SITE_OTHER): Payer: Medicare Other | Admitting: *Deleted

## 2018-11-10 ENCOUNTER — Other Ambulatory Visit: Payer: Self-pay

## 2018-11-10 DIAGNOSIS — J4540 Moderate persistent asthma, uncomplicated: Secondary | ICD-10-CM

## 2018-11-18 DIAGNOSIS — H401132 Primary open-angle glaucoma, bilateral, moderate stage: Secondary | ICD-10-CM | POA: Diagnosis not present

## 2018-11-18 DIAGNOSIS — H353112 Nonexudative age-related macular degeneration, right eye, intermediate dry stage: Secondary | ICD-10-CM | POA: Diagnosis not present

## 2018-11-23 DIAGNOSIS — J4540 Moderate persistent asthma, uncomplicated: Secondary | ICD-10-CM | POA: Diagnosis not present

## 2018-11-24 ENCOUNTER — Ambulatory Visit (INDEPENDENT_AMBULATORY_CARE_PROVIDER_SITE_OTHER): Payer: Medicare Other | Admitting: *Deleted

## 2018-11-24 ENCOUNTER — Other Ambulatory Visit: Payer: Self-pay

## 2018-11-24 DIAGNOSIS — J4540 Moderate persistent asthma, uncomplicated: Secondary | ICD-10-CM | POA: Diagnosis not present

## 2018-12-14 DIAGNOSIS — J4540 Moderate persistent asthma, uncomplicated: Secondary | ICD-10-CM | POA: Diagnosis not present

## 2018-12-15 ENCOUNTER — Ambulatory Visit (INDEPENDENT_AMBULATORY_CARE_PROVIDER_SITE_OTHER): Payer: Medicare Other | Admitting: *Deleted

## 2018-12-15 ENCOUNTER — Other Ambulatory Visit: Payer: Self-pay

## 2018-12-15 DIAGNOSIS — J4540 Moderate persistent asthma, uncomplicated: Secondary | ICD-10-CM | POA: Diagnosis not present

## 2018-12-28 DIAGNOSIS — J4540 Moderate persistent asthma, uncomplicated: Secondary | ICD-10-CM | POA: Diagnosis not present

## 2018-12-28 DIAGNOSIS — I10 Essential (primary) hypertension: Secondary | ICD-10-CM | POA: Diagnosis not present

## 2018-12-28 DIAGNOSIS — Z6829 Body mass index (BMI) 29.0-29.9, adult: Secondary | ICD-10-CM | POA: Diagnosis not present

## 2018-12-28 DIAGNOSIS — M542 Cervicalgia: Secondary | ICD-10-CM | POA: Diagnosis not present

## 2018-12-28 DIAGNOSIS — M4722 Other spondylosis with radiculopathy, cervical region: Secondary | ICD-10-CM | POA: Diagnosis not present

## 2018-12-29 ENCOUNTER — Other Ambulatory Visit: Payer: Self-pay

## 2018-12-29 ENCOUNTER — Ambulatory Visit (INDEPENDENT_AMBULATORY_CARE_PROVIDER_SITE_OTHER): Payer: Medicare Other

## 2018-12-29 DIAGNOSIS — J4540 Moderate persistent asthma, uncomplicated: Secondary | ICD-10-CM

## 2018-12-30 ENCOUNTER — Ambulatory Visit (INDEPENDENT_AMBULATORY_CARE_PROVIDER_SITE_OTHER): Payer: Medicare Other | Admitting: *Deleted

## 2018-12-30 DIAGNOSIS — Z0000 Encounter for general adult medical examination without abnormal findings: Secondary | ICD-10-CM

## 2018-12-30 NOTE — Progress Notes (Addendum)
Subjective:   Edwin [name] is a 78 y.o. male who presents for Medicare Annual/Subsequent preventive examination. I connected with patient by a telephone and verified that I am speaking with the correct person using two identifiers. Patient stated full name and DOB. Patient gave permission to continue with telephonic visit. Patient's location was at home and Nurse's location was at [place] office.   Review of Systems:   Cardiac Risk Factors include: advanced age (>110men, >55 women);dyslipidemia;male gender;hypertension Sleep patterns: gets up 0-1 times nightly to void and sleeps 6 hours nightly.    Home Safety/Smoke Alarms: Feels safe in home. Smoke alarms in place.  Living environment; residence and Firearm Safety: 2-story house. Lives with wife, no needs for DME, good support system Seat Belt Safety/Bike Helmet: Wears seat belt.   PSA-  Lab Results  Component Value Date   PSA 0.87 05/05/2016   PSA 4.23 (H) 03/03/2016   PSA 1.17 01/08/2015       Objective:    Vitals: There were no vitals taken for this visit.  There is no height or weight on file to calculate BMI.  Advanced Directives 12/30/2018 03/08/2018 11/10/2017 03/04/2016 01/06/2016 02/15/2015 10/23/2014  Does Patient Have a Medical Advance Directive? Yes Yes Yes Yes Yes No;Yes Yes  Type of Paramedic of [place];Living will Living will [place];Living will [place];Living will [place];Living will Living will [place];Living will  Does patient want to make changes to medical advance directive? - No - Patient declined - No - Patient declined No - Patient declined - -  Copy of [place] in Chart? No - copy requested - No - copy requested Yes No - copy requested - -  Pre-existing out of facility DNR order (yellow form or pink MOST form) - - - - - - -    Tobacco Social History   Tobacco Use  Smoking Status  Former Smoker  . Packs/day: 2.00  . Years: 25.00  . Pack years: 50.00  . Types: Cigarettes  . Quit date: 06/03/1983  . Years since quitting: 35.6  Smokeless Tobacco Never Used  Tobacco Comment   up to 2 ppd     Counseling given: Not Answered Comment: up to 2 ppd  Past Medical History:  Diagnosis Date  . Asthma   . COPD (chronic obstructive pulmonary disease) (HCC)    reactive airway disease  . Glaucoma   . Gout   . Hx of colonic polyps    Dr Earlean Shawl  . Hyperlipidemia    NMR lipoprofile 2005: LDL 113(1416/825), HLD 37, TG 190.  [name] Kitchen Hypertension   . Pneumonia    Past Surgical History:  Procedure Laterality Date  . ANTERIOR CERVICAL DECOMP/DISCECTOMY FUSION N/A 03/08/2018   Procedure: ANTERIOR CERVICAL DECOMPRESSION/DISCECTOMY FUSION, INTERBODY PROSTHESIS,PLATE/SCREWS CERVICAL FOUR- CERVICAL FIVE, CERVICAL FIVE- CERVICAL SIX, CERVICAL SIX- CERVICAL SEVEN;  Surgeon: Newman Pies, MD;  Location: [facility];  Service: Neurosurgery;  Laterality: N/A;  anterior  . CATARACT EXTRACTION     bilat, implants  . CHOLECYSTECTOMY  1995  . LUMBAR LAMINECTOMY  1982  . POLYPECTOMY  2003    neg 2006, Dr.Medoff  . tear duct surgery  1998   for excess tearing, skin graft L foot @ age 83 1/2   Family History  Problem Relation Age of Onset  . Heart attack Father 89       due to asthma flare  . Asthma Father   .  Emphysema Father   . Hypertension Mother   . Cancer Mother         ? primary  . Asthma Son        x2   Social History   Socioeconomic History  . Marital status: Married    Spouse name: Not on file  . Number of children: 2  . Years of education: Not on file  . Highest education level: Not on file  Occupational History  . Occupation: Charity fundraiser  . Occupation: PRESIDENT    Employer: [employer]  Social Needs  . Financial resource strain: Not hard at all  . Food insecurity    Worry: Never true    Inability: Never true  . Transportation needs    Medical: No    Non-medical:  No  Tobacco Use  . Smoking status: Former Smoker    Packs/day: 2.00    Years: 25.00    Pack years: 50.00    Types: Cigarettes    Quit date: 06/03/1983    Years since quitting: 35.6  . Smokeless tobacco: Never Used  . Tobacco comment: up to 2 ppd  Substance and Sexual Activity  . Alcohol use: No  . Drug use: No  . Sexual activity: Yes  Lifestyle  . Physical activity    Days per week: 0 days    Minutes per session: 0 min  . Stress: Not at all  Relationships  . Social connections    Talks on phone: More than three times a week    Gets together: More than three times a week    Attends religious service: More than 4 times per year    Active member of club or organization: Yes    Attends meetings of clubs or organizations: Never    Relationship status: Married  Other Topics Concern  . Not on file  Social History Narrative   Regular Exercise- no          Outpatient Encounter Medications as of 12/30/2018  Medication Sig  . acyclovir (ZOVIRAX) 200 MG capsule Take 200 mg by mouth 4 (four) times daily as needed (for fever blisters/cold sores.).  [name] Kitchen albuterol (VENTOLIN HFA) 108 (90 Base) MCG/ACT inhaler INHALE 1-2 PUFFS INTO THE LUNGS EVERY 4 HOURS AS NEEDED FOR WHEEZING OR SHORTNESS OF BREATH  . allopurinol (ZYLOPRIM) 300 MG tablet Take 1 tablet (300 mg total) by mouth daily.  [name] Kitchen atorvastatin (LIPITOR) 20 MG tablet TAKE 1 TABLET BY MOUTH EVERY DAY  . brimonidine (ALPHAGAN P) 0.1 % SOLN Place 1 drop into both eyes 2 (two) times daily. Both eyes   . budesonide-formoterol (SYMBICORT) 80-4.5 MCG/ACT inhaler TAKE 2 PUFFS BY MOUTH TWICE A DAY  . Cyanocobalamin (VITAMIN B 12 PO) Take 1 tablet by mouth daily.  [name] Kitchen dextromethorphan-guaiFENesin (MUCINEX DM) 30-600 MG 12hr tablet Take 1 tablet by mouth 2 (two) times daily as needed for cough.   . EPINEPHrine 0.3 mg/0.3 mL IJ SOAJ injection Inject 0.3 mLs (0.3 mg total) into the muscle once. (Patient taking differently: Inject 0.3 mg into the muscle  daily as needed (anaphylactic allergic reactions). )  . famotidine (PEPCID) 20 MG tablet TAKE 1 TABLET BY MOUTH EVERYDAY AT BEDTIME  . FLUoxetine (PROZAC) 10 MG capsule TAKE 1 CAPSULE (10 MG TOTAL) BY MOUTH DAILY.  [name] Kitchen ibuprofen (ADVIL) 200 MG tablet Take 200 mg by mouth every 6 (six) hours as needed.  [name] Kitchen LUMIGAN 0.01 % SOLN   . mometasone (ELOCON) 0.1 % ointment Apply topically daily.  [name] Kitchen  olmesartan-hydrochlorothiazide (BENICAR HCT) 40-12.5 MG tablet TAKE 1/2 TABLET BY MOUTH EVERY DAY  . pantoprazole (PROTONIX) 40 MG tablet Take 1 tablet (40 mg total) by mouth daily. Take 30-60 min before first meal of the day  . psyllium (METAMUCIL) 58.6 % powder Take 1 packet by mouth daily.    . [DISCONTINUED] fluconazole (DIFLUCAN) 150 MG tablet Take 1 tablet (150 mg total) by mouth daily. (Patient not taking: Reported on 12/30/2018)  . [DISCONTINUED] gabapentin (NEURONTIN) 300 MG capsule Take 1 capsule (300 mg total) by mouth at bedtime for 7 days. (Patient taking differently: Take 300 mg by mouth 3 (three) times daily. )   Facility-Administered Encounter Medications as of 12/30/2018  Medication  . omalizumab Arvid Right) injection 375 mg    Activities of Daily Living In your present state of health, do you have any difficulty performing the following activities: 12/30/2018 08/19/2018  Hearing? Y N  Vision? N N  Difficulty concentrating or making decisions? N N  Walking or climbing stairs? N N  Dressing or bathing? N N  Doing errands, shopping? N N  Preparing Food and eating ? N -  Using the Toilet? N -  In the past six months, have you accidently leaked urine? N -  Do you have problems with loss of bowel control? N -  Managing your Medications? N -  Managing your Finances? N -  Housekeeping or managing your Housekeeping? N -  Some recent data might be hidden    Patient Care Team: Janith Lima, MD as PCP - General (Internal Medicine) Tanda Rockers, MD as Consulting Physician (Pulmonary Disease)  Neldon Mc Donnamarie Poag, MD as Consulting Physician (Allergy and Immunology)   Assessment:   This is a routine wellness examination for S[place]. Physical assessment deferred to PCP.   Exercise Activities and Dietary recommendations Current Exercise Habits: The patient does not participate in regular exercise at present(was working with a trainer until covid-19. [employer] senior exercise TV program resource was provided), Exercise limited by: orthopedic condition(s)  Diet (meal preparation, eat out, water intake, caffeinated beverages, dairy products, fruits and vegetables): in general, a "healthy" diet  , well balanced   Reviewed heart healthy diet. Encouraged patient to increase daily water and healthy fluid intake.  Goals    . Patient Stated     Increase physical activity by starting to walk with my wife. I think we can do this on our property several times a week.       Fall Risk Fall Risk  12/30/2018 08/19/2018 11/10/2017 06/03/2017 03/04/2016  Falls in the past year? 0 0 No No No  Number falls in past yr: 0 - - - -  Injury with Fall? 0 - - - -    Depression Screen PHQ 2/9 Scores 12/30/2018 08/19/2018 11/10/2017 06/03/2017  PHQ - 2 Score 0 0 1 4  PHQ- 9 Score - - 4 14    Cognitive Function MMSE - Mini Mental State Exam 11/10/2017  Not completed: Refused       Ad8 score reviewed for issues:  Issues making decisions: no  Less interest in hobbies / activities: no  Repeats questions, stories (family complaining): no  Trouble using ordinary gadgets (microwave, computer, phone):no  Forgets the month or year: no  Mismanaging finances: no  Remembering appts: no  Daily problems with thinking and/or memory: no Ad8 score is= 0  Immunization History  Administered Date(s) Administered  . Influenza Split 02/19/2012  . Influenza Whole 03/09/2010, 03/17/2011  . Influenza, High  Dose Seasonal PF 01/18/2015, 03/03/2016, 02/17/2017, 02/22/2018  . Influenza-Unspecified 03/11/2013, 03/04/2014  .  Pneumococcal Conjugate-13 03/09/2008, 01/23/2016  . Pneumococcal Polysaccharide-23 07/08/2012  . Td 06/02/2006  . Tdap 01/23/2016  . Zoster 06/26/2010   Screening Tests Health Maintenance  Topic Date Due  . INFLUENZA VACCINE  01/01/2019  . TETANUS/TDAP  01/22/2026  . PNA vac Low Risk Adult  Completed        Plan:    Reviewed health maintenance screenings with patient today and relevant education, vaccines, and/or referrals were provided.   Continue to eat heart healthy diet (full of fruits, vegetables, whole grains, lean protein, water--limit salt, fat, and sugar intake) and increase physical activity as tolerated.  Continue doing brain stimulating activities (puzzles, reading, adult coloring books, staying active) to keep memory sharp.   I have personally reviewed and noted the following in the patient's chart:   . Medical and social history . Use of alcohol, tobacco or illicit drugs  . Current medications and supplements . Functional ability and status . Nutritional status . Physical activity . Advanced directives . List of other physicians . Screenings to include cognitive, depression, and falls . Referrals and appointments  In addition, I have reviewed and discussed with patient certain preventive protocols, quality metrics, and best practice recommendations. A written personalized care plan for preventive services as well as general preventive health recommendations were provided to patient.     Michiel Cowboy, RN  12/30/2018   Medical screening examination/treatment/procedure(s) were performed by non-physician practitioner and as supervising physician I was immediately available for consultation/collaboration. I agree with above. Scarlette Calico, MD

## 2019-01-04 DIAGNOSIS — M542 Cervicalgia: Secondary | ICD-10-CM | POA: Diagnosis not present

## 2019-01-04 DIAGNOSIS — R293 Abnormal posture: Secondary | ICD-10-CM | POA: Diagnosis not present

## 2019-01-04 DIAGNOSIS — R51 Headache: Secondary | ICD-10-CM | POA: Diagnosis not present

## 2019-01-04 DIAGNOSIS — M6281 Muscle weakness (generalized): Secondary | ICD-10-CM | POA: Diagnosis not present

## 2019-01-06 DIAGNOSIS — R293 Abnormal posture: Secondary | ICD-10-CM | POA: Diagnosis not present

## 2019-01-06 DIAGNOSIS — M542 Cervicalgia: Secondary | ICD-10-CM | POA: Diagnosis not present

## 2019-01-06 DIAGNOSIS — R51 Headache: Secondary | ICD-10-CM | POA: Diagnosis not present

## 2019-01-06 DIAGNOSIS — M6281 Muscle weakness (generalized): Secondary | ICD-10-CM | POA: Diagnosis not present

## 2019-01-10 DIAGNOSIS — R51 Headache: Secondary | ICD-10-CM | POA: Diagnosis not present

## 2019-01-10 DIAGNOSIS — M6281 Muscle weakness (generalized): Secondary | ICD-10-CM | POA: Diagnosis not present

## 2019-01-10 DIAGNOSIS — R293 Abnormal posture: Secondary | ICD-10-CM | POA: Diagnosis not present

## 2019-01-10 DIAGNOSIS — M542 Cervicalgia: Secondary | ICD-10-CM | POA: Diagnosis not present

## 2019-01-11 DIAGNOSIS — J4540 Moderate persistent asthma, uncomplicated: Secondary | ICD-10-CM

## 2019-01-12 ENCOUNTER — Other Ambulatory Visit: Payer: Self-pay

## 2019-01-12 ENCOUNTER — Ambulatory Visit (INDEPENDENT_AMBULATORY_CARE_PROVIDER_SITE_OTHER): Payer: Medicare Other

## 2019-01-12 DIAGNOSIS — J4540 Moderate persistent asthma, uncomplicated: Secondary | ICD-10-CM | POA: Diagnosis not present

## 2019-01-13 DIAGNOSIS — R293 Abnormal posture: Secondary | ICD-10-CM | POA: Diagnosis not present

## 2019-01-13 DIAGNOSIS — M542 Cervicalgia: Secondary | ICD-10-CM | POA: Diagnosis not present

## 2019-01-13 DIAGNOSIS — M6281 Muscle weakness (generalized): Secondary | ICD-10-CM | POA: Diagnosis not present

## 2019-01-13 DIAGNOSIS — R51 Headache: Secondary | ICD-10-CM | POA: Diagnosis not present

## 2019-01-17 DIAGNOSIS — R51 Headache: Secondary | ICD-10-CM | POA: Diagnosis not present

## 2019-01-17 DIAGNOSIS — M542 Cervicalgia: Secondary | ICD-10-CM | POA: Diagnosis not present

## 2019-01-17 DIAGNOSIS — R293 Abnormal posture: Secondary | ICD-10-CM | POA: Diagnosis not present

## 2019-01-17 DIAGNOSIS — M6281 Muscle weakness (generalized): Secondary | ICD-10-CM | POA: Diagnosis not present

## 2019-01-18 DIAGNOSIS — Z23 Encounter for immunization: Secondary | ICD-10-CM | POA: Diagnosis not present

## 2019-01-20 ENCOUNTER — Other Ambulatory Visit: Payer: Self-pay | Admitting: Allergy and Immunology

## 2019-01-25 DIAGNOSIS — M542 Cervicalgia: Secondary | ICD-10-CM | POA: Diagnosis not present

## 2019-01-25 DIAGNOSIS — M6281 Muscle weakness (generalized): Secondary | ICD-10-CM | POA: Diagnosis not present

## 2019-01-25 DIAGNOSIS — R51 Headache: Secondary | ICD-10-CM | POA: Diagnosis not present

## 2019-01-25 DIAGNOSIS — R293 Abnormal posture: Secondary | ICD-10-CM | POA: Diagnosis not present

## 2019-01-27 DIAGNOSIS — M542 Cervicalgia: Secondary | ICD-10-CM | POA: Diagnosis not present

## 2019-01-27 DIAGNOSIS — M6281 Muscle weakness (generalized): Secondary | ICD-10-CM | POA: Diagnosis not present

## 2019-01-27 DIAGNOSIS — R293 Abnormal posture: Secondary | ICD-10-CM | POA: Diagnosis not present

## 2019-01-27 DIAGNOSIS — R51 Headache: Secondary | ICD-10-CM | POA: Diagnosis not present

## 2019-02-02 DIAGNOSIS — R51 Headache: Secondary | ICD-10-CM | POA: Diagnosis not present

## 2019-02-02 DIAGNOSIS — M542 Cervicalgia: Secondary | ICD-10-CM | POA: Diagnosis not present

## 2019-02-02 DIAGNOSIS — M6281 Muscle weakness (generalized): Secondary | ICD-10-CM | POA: Diagnosis not present

## 2019-02-02 DIAGNOSIS — R293 Abnormal posture: Secondary | ICD-10-CM | POA: Diagnosis not present

## 2019-02-04 ENCOUNTER — Ambulatory Visit: Payer: Self-pay

## 2019-02-08 DIAGNOSIS — J4540 Moderate persistent asthma, uncomplicated: Secondary | ICD-10-CM | POA: Diagnosis not present

## 2019-02-09 ENCOUNTER — Ambulatory Visit (INDEPENDENT_AMBULATORY_CARE_PROVIDER_SITE_OTHER): Payer: Medicare Other | Admitting: *Deleted

## 2019-02-09 DIAGNOSIS — J4540 Moderate persistent asthma, uncomplicated: Secondary | ICD-10-CM | POA: Diagnosis not present

## 2019-02-11 DIAGNOSIS — R51 Headache: Secondary | ICD-10-CM | POA: Diagnosis not present

## 2019-02-11 DIAGNOSIS — M6281 Muscle weakness (generalized): Secondary | ICD-10-CM | POA: Diagnosis not present

## 2019-02-11 DIAGNOSIS — M542 Cervicalgia: Secondary | ICD-10-CM | POA: Diagnosis not present

## 2019-02-11 DIAGNOSIS — R293 Abnormal posture: Secondary | ICD-10-CM | POA: Diagnosis not present

## 2019-02-15 DIAGNOSIS — R51 Headache: Secondary | ICD-10-CM | POA: Diagnosis not present

## 2019-02-15 DIAGNOSIS — R293 Abnormal posture: Secondary | ICD-10-CM | POA: Diagnosis not present

## 2019-02-15 DIAGNOSIS — M542 Cervicalgia: Secondary | ICD-10-CM | POA: Diagnosis not present

## 2019-02-15 DIAGNOSIS — M6281 Muscle weakness (generalized): Secondary | ICD-10-CM | POA: Diagnosis not present

## 2019-02-17 ENCOUNTER — Other Ambulatory Visit: Payer: Self-pay | Admitting: Internal Medicine

## 2019-02-17 DIAGNOSIS — E785 Hyperlipidemia, unspecified: Secondary | ICD-10-CM

## 2019-02-18 DIAGNOSIS — M6281 Muscle weakness (generalized): Secondary | ICD-10-CM | POA: Diagnosis not present

## 2019-02-18 DIAGNOSIS — R293 Abnormal posture: Secondary | ICD-10-CM | POA: Diagnosis not present

## 2019-02-18 DIAGNOSIS — R51 Headache: Secondary | ICD-10-CM | POA: Diagnosis not present

## 2019-02-18 DIAGNOSIS — M542 Cervicalgia: Secondary | ICD-10-CM | POA: Diagnosis not present

## 2019-02-22 DIAGNOSIS — R293 Abnormal posture: Secondary | ICD-10-CM | POA: Diagnosis not present

## 2019-02-22 DIAGNOSIS — M6281 Muscle weakness (generalized): Secondary | ICD-10-CM | POA: Diagnosis not present

## 2019-02-22 DIAGNOSIS — J4540 Moderate persistent asthma, uncomplicated: Secondary | ICD-10-CM | POA: Diagnosis not present

## 2019-02-22 DIAGNOSIS — M542 Cervicalgia: Secondary | ICD-10-CM | POA: Diagnosis not present

## 2019-02-22 DIAGNOSIS — R51 Headache: Secondary | ICD-10-CM | POA: Diagnosis not present

## 2019-02-23 ENCOUNTER — Other Ambulatory Visit: Payer: Self-pay

## 2019-02-23 ENCOUNTER — Ambulatory Visit (INDEPENDENT_AMBULATORY_CARE_PROVIDER_SITE_OTHER): Payer: Medicare Other | Admitting: *Deleted

## 2019-02-23 DIAGNOSIS — J4540 Moderate persistent asthma, uncomplicated: Secondary | ICD-10-CM | POA: Diagnosis not present

## 2019-02-25 DIAGNOSIS — M6281 Muscle weakness (generalized): Secondary | ICD-10-CM | POA: Diagnosis not present

## 2019-02-25 DIAGNOSIS — M542 Cervicalgia: Secondary | ICD-10-CM | POA: Diagnosis not present

## 2019-02-25 DIAGNOSIS — R293 Abnormal posture: Secondary | ICD-10-CM | POA: Diagnosis not present

## 2019-02-25 DIAGNOSIS — R51 Headache: Secondary | ICD-10-CM | POA: Diagnosis not present

## 2019-03-01 DIAGNOSIS — R293 Abnormal posture: Secondary | ICD-10-CM | POA: Diagnosis not present

## 2019-03-01 DIAGNOSIS — M542 Cervicalgia: Secondary | ICD-10-CM | POA: Diagnosis not present

## 2019-03-01 DIAGNOSIS — R51 Headache: Secondary | ICD-10-CM | POA: Diagnosis not present

## 2019-03-01 DIAGNOSIS — M6281 Muscle weakness (generalized): Secondary | ICD-10-CM | POA: Diagnosis not present

## 2019-03-11 ENCOUNTER — Other Ambulatory Visit: Payer: Self-pay | Admitting: Internal Medicine

## 2019-03-11 DIAGNOSIS — I10 Essential (primary) hypertension: Secondary | ICD-10-CM

## 2019-03-15 DIAGNOSIS — J4540 Moderate persistent asthma, uncomplicated: Secondary | ICD-10-CM | POA: Diagnosis not present

## 2019-03-16 ENCOUNTER — Ambulatory Visit (INDEPENDENT_AMBULATORY_CARE_PROVIDER_SITE_OTHER): Payer: Medicare Other

## 2019-03-16 ENCOUNTER — Other Ambulatory Visit: Payer: Self-pay

## 2019-03-16 DIAGNOSIS — J4540 Moderate persistent asthma, uncomplicated: Secondary | ICD-10-CM | POA: Diagnosis not present

## 2019-03-17 DIAGNOSIS — M6281 Muscle weakness (generalized): Secondary | ICD-10-CM | POA: Diagnosis not present

## 2019-03-17 DIAGNOSIS — R519 Headache, unspecified: Secondary | ICD-10-CM | POA: Diagnosis not present

## 2019-03-17 DIAGNOSIS — R293 Abnormal posture: Secondary | ICD-10-CM | POA: Diagnosis not present

## 2019-03-17 DIAGNOSIS — M542 Cervicalgia: Secondary | ICD-10-CM | POA: Diagnosis not present

## 2019-04-04 DIAGNOSIS — J4540 Moderate persistent asthma, uncomplicated: Secondary | ICD-10-CM | POA: Diagnosis not present

## 2019-04-05 ENCOUNTER — Other Ambulatory Visit: Payer: Self-pay

## 2019-04-05 ENCOUNTER — Ambulatory Visit (INDEPENDENT_AMBULATORY_CARE_PROVIDER_SITE_OTHER): Payer: Medicare Other

## 2019-04-05 DIAGNOSIS — J4540 Moderate persistent asthma, uncomplicated: Secondary | ICD-10-CM

## 2019-04-05 DIAGNOSIS — M542 Cervicalgia: Secondary | ICD-10-CM | POA: Diagnosis not present

## 2019-04-05 DIAGNOSIS — I10 Essential (primary) hypertension: Secondary | ICD-10-CM | POA: Diagnosis not present

## 2019-04-05 DIAGNOSIS — Z6830 Body mass index (BMI) 30.0-30.9, adult: Secondary | ICD-10-CM | POA: Diagnosis not present

## 2019-04-08 ENCOUNTER — Other Ambulatory Visit: Payer: Self-pay | Admitting: Internal Medicine

## 2019-04-12 ENCOUNTER — Other Ambulatory Visit: Payer: Self-pay | Admitting: Allergy and Immunology

## 2019-04-12 DIAGNOSIS — M542 Cervicalgia: Secondary | ICD-10-CM | POA: Diagnosis not present

## 2019-04-12 DIAGNOSIS — M6281 Muscle weakness (generalized): Secondary | ICD-10-CM | POA: Diagnosis not present

## 2019-04-12 DIAGNOSIS — R293 Abnormal posture: Secondary | ICD-10-CM | POA: Diagnosis not present

## 2019-04-18 DIAGNOSIS — J4540 Moderate persistent asthma, uncomplicated: Secondary | ICD-10-CM | POA: Diagnosis not present

## 2019-04-19 ENCOUNTER — Ambulatory Visit (INDEPENDENT_AMBULATORY_CARE_PROVIDER_SITE_OTHER): Payer: Medicare Other | Admitting: *Deleted

## 2019-04-19 ENCOUNTER — Other Ambulatory Visit: Payer: Self-pay

## 2019-04-19 DIAGNOSIS — R293 Abnormal posture: Secondary | ICD-10-CM | POA: Diagnosis not present

## 2019-04-19 DIAGNOSIS — J4540 Moderate persistent asthma, uncomplicated: Secondary | ICD-10-CM

## 2019-04-19 DIAGNOSIS — M6281 Muscle weakness (generalized): Secondary | ICD-10-CM | POA: Diagnosis not present

## 2019-04-19 DIAGNOSIS — M542 Cervicalgia: Secondary | ICD-10-CM | POA: Diagnosis not present

## 2019-04-19 DIAGNOSIS — M546 Pain in thoracic spine: Secondary | ICD-10-CM | POA: Diagnosis not present

## 2019-05-03 DIAGNOSIS — J4540 Moderate persistent asthma, uncomplicated: Secondary | ICD-10-CM

## 2019-05-04 ENCOUNTER — Other Ambulatory Visit: Payer: Self-pay

## 2019-05-04 ENCOUNTER — Ambulatory Visit (INDEPENDENT_AMBULATORY_CARE_PROVIDER_SITE_OTHER): Payer: Medicare Other

## 2019-05-04 DIAGNOSIS — J4540 Moderate persistent asthma, uncomplicated: Secondary | ICD-10-CM | POA: Diagnosis not present

## 2019-05-05 DIAGNOSIS — M6281 Muscle weakness (generalized): Secondary | ICD-10-CM | POA: Diagnosis not present

## 2019-05-05 DIAGNOSIS — R293 Abnormal posture: Secondary | ICD-10-CM | POA: Diagnosis not present

## 2019-05-05 DIAGNOSIS — M546 Pain in thoracic spine: Secondary | ICD-10-CM | POA: Diagnosis not present

## 2019-05-05 DIAGNOSIS — M542 Cervicalgia: Secondary | ICD-10-CM | POA: Diagnosis not present

## 2019-05-08 ENCOUNTER — Other Ambulatory Visit: Payer: Self-pay | Admitting: Allergy and Immunology

## 2019-05-12 DIAGNOSIS — M542 Cervicalgia: Secondary | ICD-10-CM | POA: Diagnosis not present

## 2019-05-12 DIAGNOSIS — M546 Pain in thoracic spine: Secondary | ICD-10-CM | POA: Diagnosis not present

## 2019-05-12 DIAGNOSIS — R293 Abnormal posture: Secondary | ICD-10-CM | POA: Diagnosis not present

## 2019-05-12 DIAGNOSIS — M6281 Muscle weakness (generalized): Secondary | ICD-10-CM | POA: Diagnosis not present

## 2019-05-18 ENCOUNTER — Ambulatory Visit: Payer: Self-pay

## 2019-05-19 ENCOUNTER — Other Ambulatory Visit: Payer: Self-pay | Admitting: Allergy and Immunology

## 2019-05-25 DIAGNOSIS — J4540 Moderate persistent asthma, uncomplicated: Secondary | ICD-10-CM | POA: Diagnosis not present

## 2019-05-30 ENCOUNTER — Other Ambulatory Visit: Payer: Self-pay

## 2019-05-30 ENCOUNTER — Telehealth: Payer: Self-pay

## 2019-05-30 ENCOUNTER — Ambulatory Visit (INDEPENDENT_AMBULATORY_CARE_PROVIDER_SITE_OTHER): Payer: Medicare Other

## 2019-05-30 DIAGNOSIS — J4540 Moderate persistent asthma, uncomplicated: Secondary | ICD-10-CM

## 2019-05-30 MED ORDER — BUDESONIDE-FORMOTEROL FUMARATE 80-4.5 MCG/ACT IN AERO: INHALATION_SPRAY | RESPIRATORY_TRACT | 0 refills | Status: DC

## 2019-05-30 NOTE — Telephone Encounter (Signed)
Patient came in and made his follow up to see Dr Neldon Mc. Patient is needing a refill on his symbicort 77.  Please Advise   [facility] [address]

## 2019-05-30 NOTE — Telephone Encounter (Signed)
Medication sent to pharmacy on file. Left message making patient aware.

## 2019-06-07 ENCOUNTER — Other Ambulatory Visit: Payer: Self-pay

## 2019-06-07 MED ORDER — PANTOPRAZOLE SODIUM 40 MG PO TBEC: DELAYED_RELEASE_TABLET | ORAL | 0 refills | Status: DC

## 2019-06-14 ENCOUNTER — Ambulatory Visit: Payer: Self-pay

## 2019-06-14 ENCOUNTER — Other Ambulatory Visit: Payer: Self-pay

## 2019-06-14 DIAGNOSIS — J4540 Moderate persistent asthma, uncomplicated: Secondary | ICD-10-CM | POA: Diagnosis not present

## 2019-06-14 NOTE — Telephone Encounter (Signed)
error 

## 2019-06-15 ENCOUNTER — Ambulatory Visit (INDEPENDENT_AMBULATORY_CARE_PROVIDER_SITE_OTHER): Payer: Medicare Other | Admitting: *Deleted

## 2019-06-15 ENCOUNTER — Other Ambulatory Visit: Payer: Self-pay

## 2019-06-15 DIAGNOSIS — J4540 Moderate persistent asthma, uncomplicated: Secondary | ICD-10-CM | POA: Diagnosis not present

## 2019-06-16 ENCOUNTER — Other Ambulatory Visit: Payer: Self-pay

## 2019-06-16 MED ORDER — PANTOPRAZOLE SODIUM 40 MG PO TBEC: DELAYED_RELEASE_TABLET | ORAL | 0 refills | Status: DC

## 2019-06-23 ENCOUNTER — Ambulatory Visit: Payer: Medicare Other | Attending: Internal Medicine

## 2019-06-23 DIAGNOSIS — Z23 Encounter for immunization: Secondary | ICD-10-CM

## 2019-06-23 NOTE — Progress Notes (Signed)
   Covid-19 Vaccination Clinic  Name:  Edwin [name]    MRN: [id number] DOB: 03-23-1941  06/23/2019  Edwin [name] was observed post Covid-19 immunization for 15 minutes without incidence. He was provided with Vaccine Information Sheet and instruction to access the V-Safe system.   Edwin [name] was instructed to call 911 with any severe reactions post vaccine: [name] Kitchen Difficulty breathing  . Swelling of your face and throat  . A fast heartbeat  . A bad rash all over your body  . Dizziness and weakness    Immunizations Administered    Name Date Dose VIS Date Route   Pfizer COVID-19 Vaccine 06/23/2019 10:20 AM 0.3 mL 05/13/2019 Intramuscular   Manufacturer: [place]   Lot: [id number]   [place]: [id number]

## 2019-06-27 DIAGNOSIS — K219 Gastro-esophageal reflux disease without esophagitis: Secondary | ICD-10-CM | POA: Diagnosis not present

## 2019-06-27 DIAGNOSIS — J324 Chronic pansinusitis: Secondary | ICD-10-CM | POA: Diagnosis not present

## 2019-06-27 DIAGNOSIS — J3089 Other allergic rhinitis: Secondary | ICD-10-CM | POA: Diagnosis not present

## 2019-06-27 DIAGNOSIS — J849 Interstitial pulmonary disease, unspecified: Secondary | ICD-10-CM | POA: Diagnosis not present

## 2019-06-27 DIAGNOSIS — J449 Chronic obstructive pulmonary disease, unspecified: Secondary | ICD-10-CM | POA: Diagnosis not present

## 2019-06-27 DIAGNOSIS — J4540 Moderate persistent asthma, uncomplicated: Secondary | ICD-10-CM | POA: Diagnosis not present

## 2019-06-27 DIAGNOSIS — L308 Other specified dermatitis: Secondary | ICD-10-CM | POA: Diagnosis not present

## 2019-06-28 ENCOUNTER — Ambulatory Visit: Payer: Medicare Other

## 2019-06-28 ENCOUNTER — Ambulatory Visit (INDEPENDENT_AMBULATORY_CARE_PROVIDER_SITE_OTHER): Payer: Medicare Other | Admitting: Allergy and Immunology

## 2019-06-28 ENCOUNTER — Other Ambulatory Visit: Payer: Self-pay

## 2019-06-28 ENCOUNTER — Encounter: Payer: Self-pay | Admitting: Allergy and Immunology

## 2019-06-28 VITALS — BP 112/74 | HR 98 | Temp 97.1°F | Resp 18

## 2019-06-28 DIAGNOSIS — J324 Chronic pansinusitis: Secondary | ICD-10-CM

## 2019-06-28 DIAGNOSIS — J449 Chronic obstructive pulmonary disease, unspecified: Secondary | ICD-10-CM

## 2019-06-28 DIAGNOSIS — J4540 Moderate persistent asthma, uncomplicated: Secondary | ICD-10-CM | POA: Diagnosis not present

## 2019-06-28 DIAGNOSIS — L308 Other specified dermatitis: Secondary | ICD-10-CM

## 2019-06-28 DIAGNOSIS — K219 Gastro-esophageal reflux disease without esophagitis: Secondary | ICD-10-CM

## 2019-06-28 DIAGNOSIS — J3089 Other allergic rhinitis: Secondary | ICD-10-CM | POA: Diagnosis not present

## 2019-06-28 DIAGNOSIS — J849 Interstitial pulmonary disease, unspecified: Secondary | ICD-10-CM

## 2019-06-28 DIAGNOSIS — L989 Disorder of the skin and subcutaneous tissue, unspecified: Secondary | ICD-10-CM

## 2019-06-28 MED ORDER — PANTOPRAZOLE SODIUM 40 MG PO TBEC: DELAYED_RELEASE_TABLET | ORAL | 1 refills | Status: DC

## 2019-06-28 MED ORDER — BUDESONIDE-FORMOTEROL FUMARATE 80-4.5 MCG/ACT IN AERO: INHALATION_SPRAY | RESPIRATORY_TRACT | 5 refills | Status: DC

## 2019-06-28 MED ORDER — ALBUTEROL SULFATE HFA 108 (90 BASE) MCG/ACT IN AERS: INHALATION_SPRAY | RESPIRATORY_TRACT | 1 refills | Status: DC

## 2019-06-28 MED ORDER — MOMETASONE FUROATE 0.1 % EX OINT: TOPICAL_OINTMENT | CUTANEOUS | 1 refills | Status: DC

## 2019-06-28 MED ORDER — FAMOTIDINE 20 MG PO TABS: ORAL_TABLET | ORAL | 1 refills | Status: DC

## 2019-06-28 NOTE — Progress Notes (Signed)
[facility]   Follow-up Note  Referring Provider: Janith Lima, MD Primary Provider: Janith Lima, MD Date of Office Visit: 06/28/2019  Subjective:   Edwin [name] (DOB: [birth year]) is a 79 y.o. male who returns to the [facility] on 06/28/2019 in re-evaluation of the following:  HPI: Scotte returns to this clinic in evaluation of COPD with component of asthma, interstitial lung disease, allergic rhinitis, history of chronic sinusitis, history of reflux, and history of pruritic inflammatory dermatosis.  His last visit in this clinic with me was 28 September 2018.  He has really done well with his lungs since his last visit.  He has not required a systemic steroid or an antibiotic for any type of airway issue.  His nose is doing relatively well.  He continues to consistently use his Xolair and Symbicort and intermittently a nasal steroid.  Rarely does use a short acting bronchodilator.  He still has significant exertion on dyspnea and he still has some coughing on occasion to clear out his chest.  Reflux has been under excellent control.  His skin has been doing very well.  He did receive the flu vaccine and his initial Covid vaccine.  Allergies as of 06/28/2019      Reactions   Ramipril    REACTION: chest congestion & cough. No angioedema      Medication List      acyclovir 200 MG capsule Commonly known as: ZOVIRAX Take 200 mg by mouth 4 (four) times daily as needed (for fever blisters/cold sores.).   Advil 200 MG tablet Generic drug: ibuprofen Take 200 mg by mouth every 6 (six) hours as needed.   albuterol 108 (90 Base) MCG/ACT inhaler Commonly known as: Ventolin HFA INHALE 1-2 PUFFS INTO THE LUNGS EVERY 4 HOURS AS NEEDED FOR WHEEZING OR SHORTNESS OF BREATH   allopurinol 300 MG tablet Commonly known as: ZYLOPRIM TAKE 1 TABLET BY MOUTH EVERY DAY   atorvastatin 20 MG tablet Commonly known as:  LIPITOR TAKE 1 TABLET BY MOUTH EVERY DAY   brimonidine 0.1 % Soln Commonly known as: ALPHAGAN P Place 1 drop into both eyes 2 (two) times daily. Both eyes   budesonide-formoterol 80-4.5 MCG/ACT inhaler Commonly known as: Symbicort TAKE 2 PUFFS BY MOUTH TWICE A DAY   dextromethorphan-guaiFENesin 30-600 MG 12hr tablet Commonly known as: MUCINEX DM Take 1 tablet by mouth 2 (two) times daily as needed for cough.   EPINEPHrine 0.3 mg/0.3 mL Soaj injection Commonly known as: EPI-PEN Inject 0.3 mLs (0.3 mg total) into the muscle once.   famotidine 20 MG tablet Commonly known as: PEPCID TAKE 1 TABLET BY MOUTH EVERYDAY AT BEDTIME   FLUoxetine 10 MG capsule Commonly known as: PROZAC TAKE 1 CAPSULE BY MOUTH EVERY DAY   Lumigan 0.01 % Soln Generic drug: bimatoprost   mometasone 0.1 % ointment Commonly known as: ELOCON Apply topically daily.   olmesartan-hydrochlorothiazide 40-12.5 MG tablet Commonly known as: BENICAR HCT TAKE 1/2 TABLET BY MOUTH EVERY DAY   omeprazole 20 MG capsule Commonly known as: PRILOSEC Take 20 mg by mouth daily.   pantoprazole 40 MG tablet Commonly known as: PROTONIX TAKE 1 TABLET BY MOUTH DAILY. TAKE 30-60 MIN BEFORE FIRST MEAL OF THE DAY -- NEED OFFICE VISIT   psyllium 58.6 % powder Commonly known as: METAMUCIL Take 1 packet by mouth daily.   VITAMIN B 12 PO Take 1 tablet by mouth daily.  Past Medical History:  Diagnosis Date  . Asthma   . COPD (chronic obstructive pulmonary disease) (HCC)    reactive airway disease  . Glaucoma   . Gout   . Hx of colonic polyps    Dr Earlean Shawl  . Hyperlipidemia    NMR lipoprofile 2005: LDL 113(1416/825), HLD 37, TG 190.  [name] Kitchen Hypertension   . Pneumonia     Past Surgical History:  Procedure Laterality Date  . ANTERIOR CERVICAL DECOMP/DISCECTOMY FUSION N/A 03/08/2018   Procedure: ANTERIOR CERVICAL DECOMPRESSION/DISCECTOMY FUSION, INTERBODY PROSTHESIS,PLATE/SCREWS CERVICAL FOUR- CERVICAL FIVE,  CERVICAL FIVE- CERVICAL SIX, CERVICAL SIX- CERVICAL SEVEN;  Surgeon: Newman Pies, MD;  Location: [facility];  Service: Neurosurgery;  Laterality: N/A;  anterior  . CATARACT EXTRACTION     bilat, implants  . CHOLECYSTECTOMY  1995  . LUMBAR LAMINECTOMY  1982  . POLYPECTOMY  2003    neg 2006, Dr.Medoff  . tear duct surgery  1998   for excess tearing, skin graft L foot @ age 62 1/2    Review of systems negative except as noted in HPI / PMHx or noted below:  Review of Systems  Constitutional: Negative.   HENT: Negative.   Eyes: Negative.   Respiratory: Negative.   Cardiovascular: Negative.   Gastrointestinal: Negative.   Genitourinary: Negative.   Musculoskeletal: Negative.   Skin: Negative.   Neurological: Negative.   Endo/Heme/Allergies: Negative.   Psychiatric/Behavioral: Negative.      Objective:   Vitals:   06/28/19 1327  BP: 112/74  Pulse: 98  Resp: 18  Temp: (!) 97.1 F (36.2 C)  SpO2: 96%          Physical Exam Constitutional:      Appearance: He is not diaphoretic.  HENT:     Head: Normocephalic.     Right Ear: Tympanic membrane, ear canal and external ear normal.     Left Ear: Tympanic membrane, ear canal and external ear normal.     Nose: Nose normal. No mucosal edema or rhinorrhea.     Mouth/Throat:     Pharynx: Uvula midline. No oropharyngeal exudate.  Eyes:     Conjunctiva/sclera: Conjunctivae normal.  Neck:     Thyroid: No thyromegaly.     Trachea: Trachea normal. No tracheal tenderness or tracheal deviation.  Cardiovascular:     Rate and Rhythm: Normal rate and regular rhythm.     Heart sounds: Normal heart sounds, S1 normal and S2 normal. No murmur.  Pulmonary:     Effort: No respiratory distress.     Breath sounds: Normal breath sounds. No stridor. No wheezing or rales.  Lymphadenopathy:     Head:     Right side of head: No tonsillar adenopathy.     Left side of head: No tonsillar adenopathy.     Cervical: No cervical adenopathy.   Skin:    Findings: No erythema or rash.     Nails: There is no clubbing.  Neurological:     Mental Status: He is alert.     Diagnostics:    Spirometry was performed and demonstrated an FEV1 of 2.0 at 73 % of predicted.  The patient had an Asthma Control Test with the following results: ACT Total Score: 22.    Results of a high-resolution chest CT scan obtained 03 November 2018 identified the following:  Cardiovascular: Scattered aortic atherosclerosis. Normal heart size. Three-vessel coronary artery calcifications. No pericardial effusion.  Mediastinum/Nodes: No enlarged mediastinal, hilar, or axillary lymph nodes. Thyroid gland, trachea, and esophagus demonstrate  no significant findings.  Lungs/Pleura: Mild centrilobular emphysema. Unchanged elevation of the right hemidiaphragm with right greater than left bibasilar scarring and minimal subpleural interstitial reticular opacity, in general very slightly increased on examinations dating back to 2012 and not significantly changed in the immediate interval. A ground-glass opacity previously seen in the left upper lobe is resolved. There are additional stable small pulmonary nodules, for example a 3 mm nodule in the right upper lobe (series 8, image 20). No pleural effusion or pneumothorax.   Assessment and Plan:   1. COPD with asthma ([place])   2. Interstitial lung disease (HCC)   3. Other allergic rhinitis   4. Chronic pansinusitis   5. LPRD (laryngopharyngeal reflux disease)   6. Inflammatory dermatosis     1. Continue to Treat inflammation:   A. Symbicort 80 - 2 inhalations 2 times per day  B. Xolair injections and EpiPen    2. Continue to Treat reflux:   A.  Pantoprazole 40 mg in the a.m. + famotidine 20 mg in the p.m.  3.  If needed:   A. ProAir HFA 2 puffs every 4-6 hours  B. nasal saline washes  C. OTC antihistamine  D. Mucinex DM 2 tablets twice a day  E. Duoneb nebulized every 4-6 hours if needed.  F.  Mometasone 0.1% ointment applied to rash 1 time per day  G. Nasacort one spray each nostril one time per day  5. Return to clinic in 6 months or earlier if problem     Timmey has had a stable period of time since his last visit regarding his airway and his reflux on a large collection of medical therapy directed against inflammation and reflux.  He will remain on his current medications directed against these specific disease states and assuming he continues to do well with this plan I will see him back in his clinic in 6 months or earlier if there is a problem.  Allena Katz, MD Allergy / Immunology [place]

## 2019-06-28 NOTE — Patient Instructions (Addendum)
   1. Continue to Treat inflammation:   A. Symbicort 80 - 2 inhalations 2 times per day  B. Xolair injections and EpiPen    2. Continue to Treat reflux:   A.  Pantoprazole 40 mg in the a.m. + famotidine 20 mg in the p.m.  3.  If needed:   A. ProAir HFA 2 puffs every 4-6 hours  B. nasal saline washes  C. OTC antihistamine  D. Mucinex DM 2 tablets twice a day  E. Duoneb nebulized every 4-6 hours if needed.  F. Mometasone 0.1% ointment applied to rash 1 time per day  G. Nasacort one spray each nostril one time per day  5. Return to clinic in 6 months or earlier if problem

## 2019-06-29 ENCOUNTER — Encounter: Payer: Self-pay | Admitting: Allergy and Immunology

## 2019-06-29 ENCOUNTER — Ambulatory Visit: Payer: Self-pay

## 2019-07-13 ENCOUNTER — Ambulatory Visit: Payer: Medicare Other | Attending: Internal Medicine

## 2019-07-13 DIAGNOSIS — Z23 Encounter for immunization: Secondary | ICD-10-CM | POA: Insufficient documentation

## 2019-07-13 NOTE — Progress Notes (Signed)
   Covid-19 Vaccination Clinic  Name:  JAIMIE EDGECOMB    MRN: [id number] DOB: Jun 03, 1940  07/13/2019  Mr. Jacques was observed post Covid-19 immunization for 15 minutes without incidence. He was provided with Vaccine Information Sheet and instruction to access the V-Safe system.   Mr. Matkin was instructed to call 911 with any severe reactions post vaccine: [name] Kitchen Difficulty breathing  . Swelling of your face and throat  . A fast heartbeat  . A bad rash all over your body  . Dizziness and weakness    Immunizations Administered    Name Date Dose VIS Date Route   Pfizer COVID-19 Vaccine 07/13/2019  3:10 PM 0.3 mL 05/13/2019 Intramuscular   Manufacturer: [place]   Lot: [id number]   [place]: [id number]

## 2019-07-18 DIAGNOSIS — J4540 Moderate persistent asthma, uncomplicated: Secondary | ICD-10-CM | POA: Diagnosis not present

## 2019-07-19 ENCOUNTER — Ambulatory Visit (INDEPENDENT_AMBULATORY_CARE_PROVIDER_SITE_OTHER): Payer: Medicare Other

## 2019-07-19 ENCOUNTER — Other Ambulatory Visit: Payer: Self-pay

## 2019-07-19 DIAGNOSIS — J4540 Moderate persistent asthma, uncomplicated: Secondary | ICD-10-CM

## 2019-08-02 DIAGNOSIS — J4540 Moderate persistent asthma, uncomplicated: Secondary | ICD-10-CM | POA: Diagnosis not present

## 2019-08-03 ENCOUNTER — Other Ambulatory Visit: Payer: Self-pay

## 2019-08-03 ENCOUNTER — Ambulatory Visit (INDEPENDENT_AMBULATORY_CARE_PROVIDER_SITE_OTHER): Payer: Medicare Other

## 2019-08-03 DIAGNOSIS — J4540 Moderate persistent asthma, uncomplicated: Secondary | ICD-10-CM | POA: Diagnosis not present

## 2019-08-16 DIAGNOSIS — J4540 Moderate persistent asthma, uncomplicated: Secondary | ICD-10-CM | POA: Diagnosis not present

## 2019-08-17 ENCOUNTER — Ambulatory Visit (INDEPENDENT_AMBULATORY_CARE_PROVIDER_SITE_OTHER): Payer: Medicare Other

## 2019-08-17 ENCOUNTER — Other Ambulatory Visit: Payer: Self-pay

## 2019-08-17 DIAGNOSIS — J4540 Moderate persistent asthma, uncomplicated: Secondary | ICD-10-CM | POA: Diagnosis not present

## 2019-08-18 IMAGING — CT CT ABD-PELV W/ CM
2 of 5 series · 15 of 46 positions shown, 17 images · IV contrast (ISOVUE 300)
Comparison: Hip MRI 01/08/2004.  Chest CTA 01/06/2016

CLINICAL DATA: 76-year-old male with left lower quadrant abdominal
pain and low-grade fever since 05/25/2017. Started antibiotics 2
days ago. Query diverticulitis.

EXAM:
CT ABDOMEN AND PELVIS WITH CONTRAST
TECHNIQUE: Multidetector CT imaging of the abdomen and pelvis was performed
using the standard protocol following bolus administration of
intravenous contrast.
CONTRAST:  100mL [id number] IOPAMIDOL ([id number]) INJECTION 61%

[Series 2: abd/pel w · axial · 0.81mm/px · z∈[-488,-3]mm · 12 of 111 slices shown, 14 images]
[im 7/111  soft-tissue]
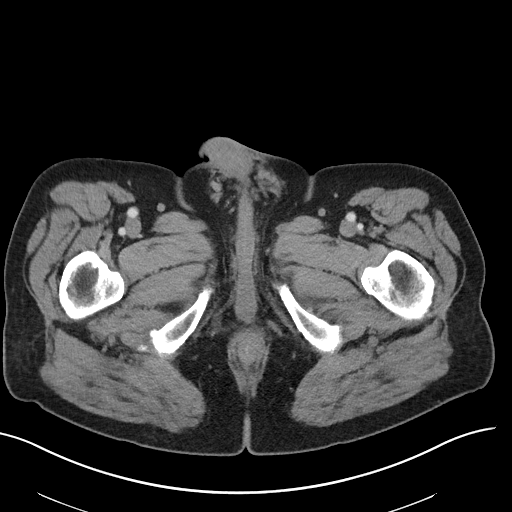
[im 7/111  bone]
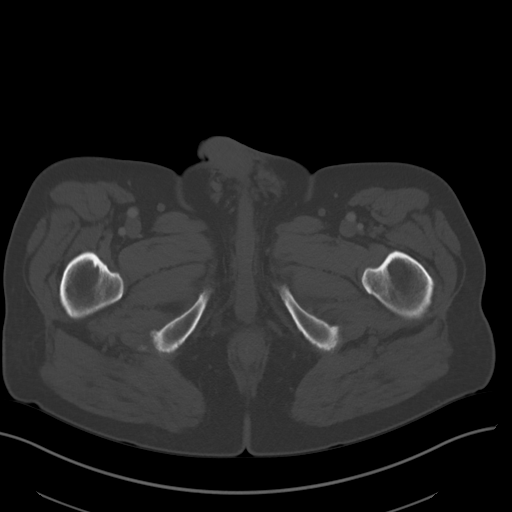
[im 19/111  soft-tissue]
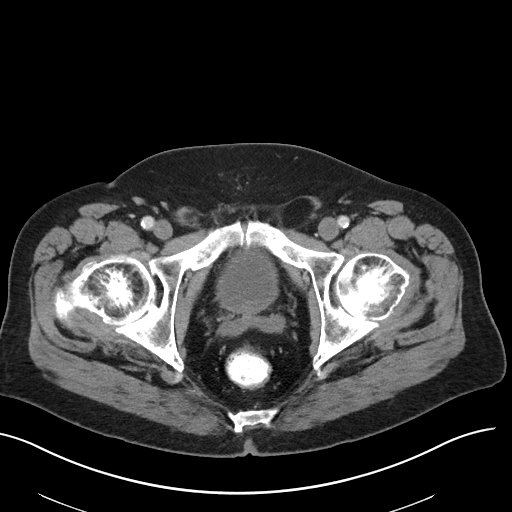
[im 25/111  soft-tissue]
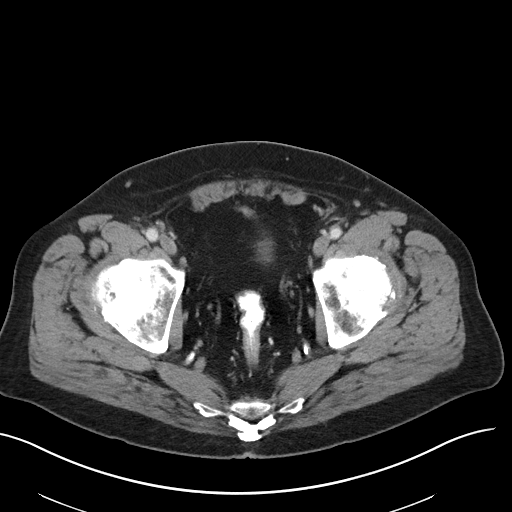
[im 31/111  soft-tissue]
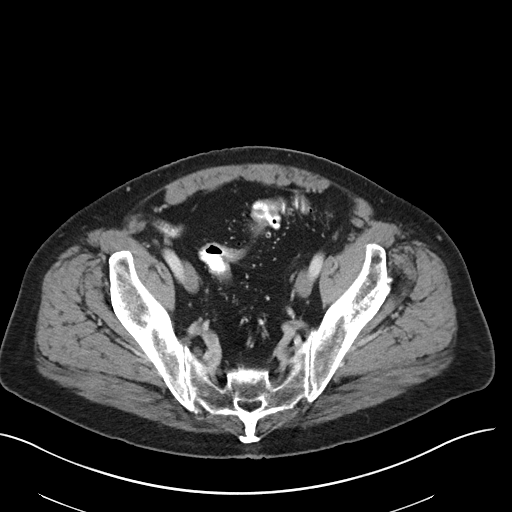
[im 43/111  soft-tissue]
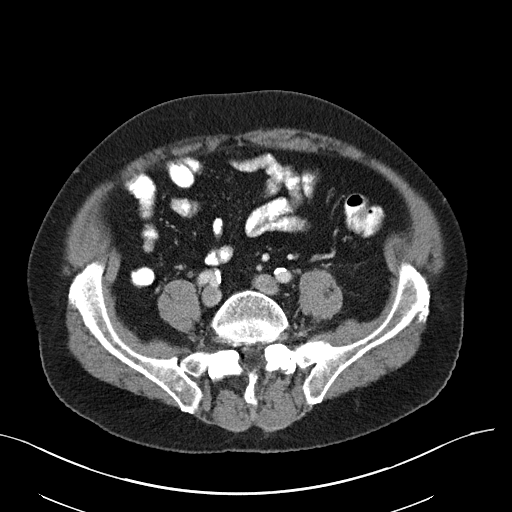
[im 49/111  soft-tissue]
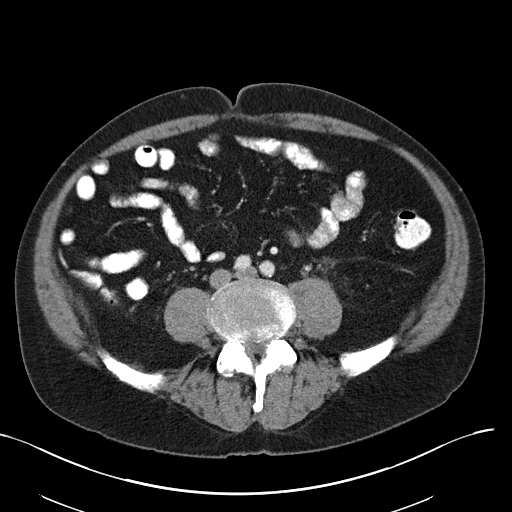
[im 62/111  soft-tissue]
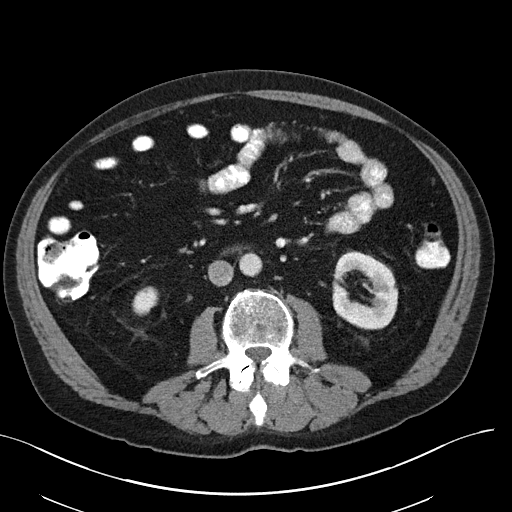
[im 68/111  soft-tissue]
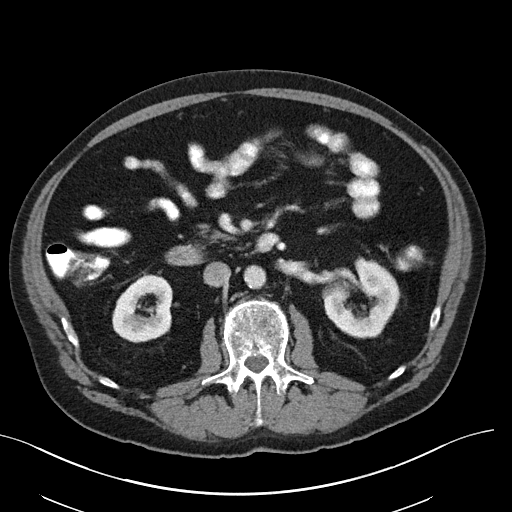
[im 80/111  soft-tissue]
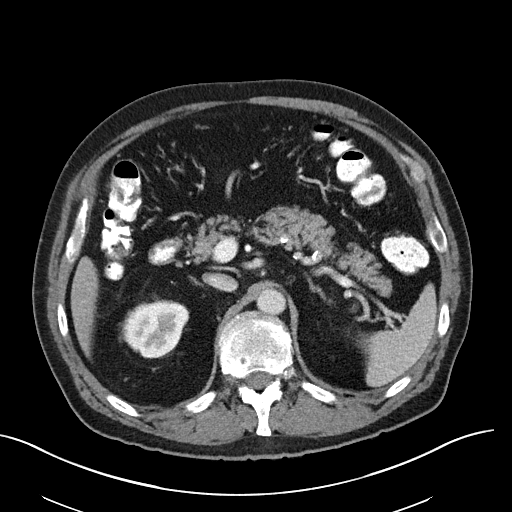
[im 80/111  bone]
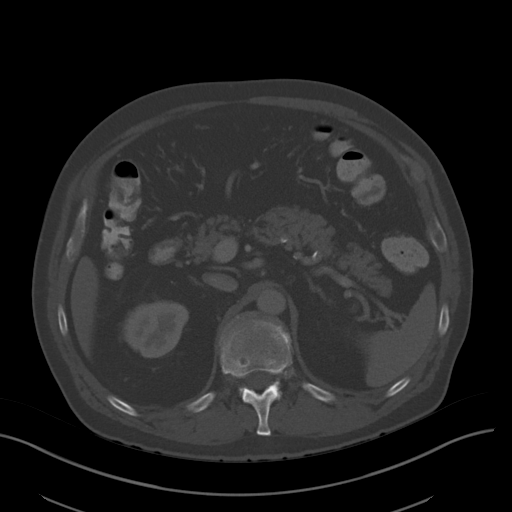
[im 86/111  soft-tissue]
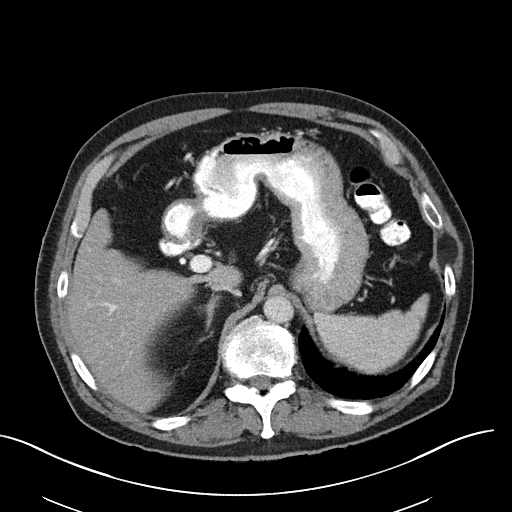
[im 92/111  soft-tissue]
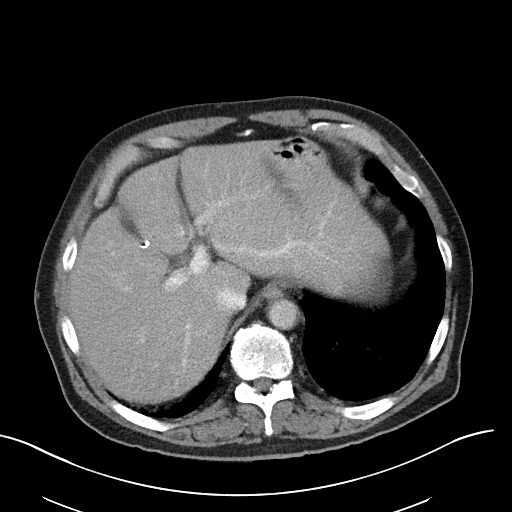
[im 104/111  soft-tissue]
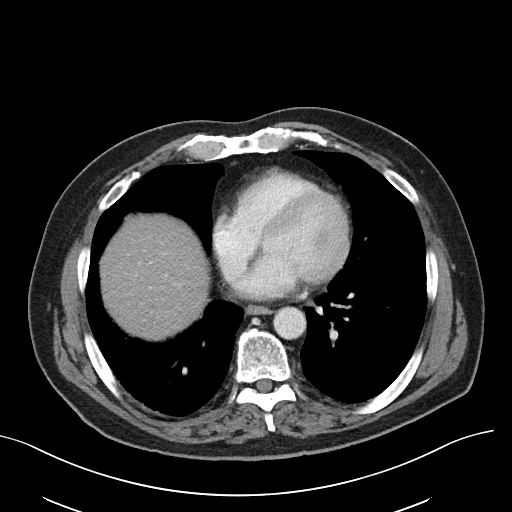

[Series 6: abd/pel w st · coronal · 0.80mm/px · 3 of 101 slices shown]
[im 34/101  soft-tissue]
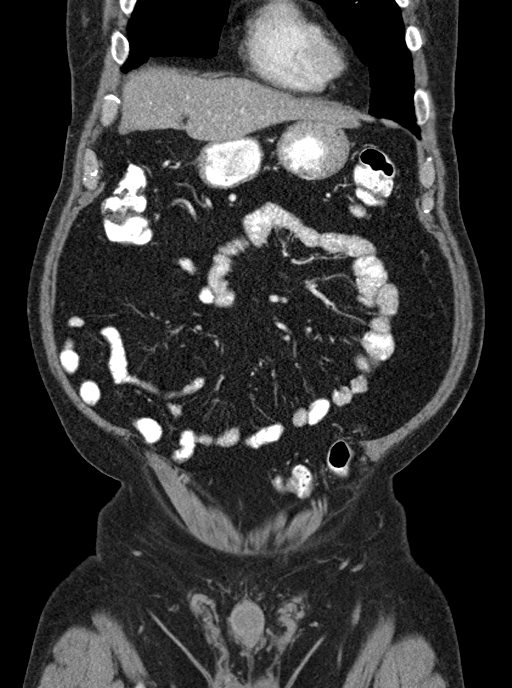
[im 45/101  soft-tissue]
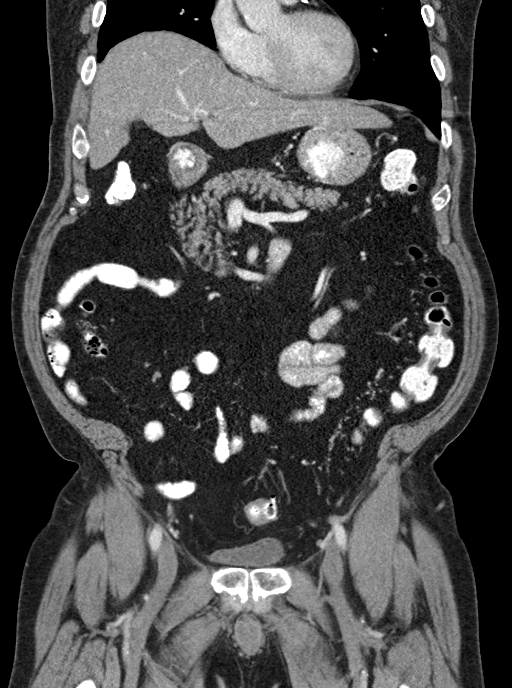
[im 56/101  soft-tissue]
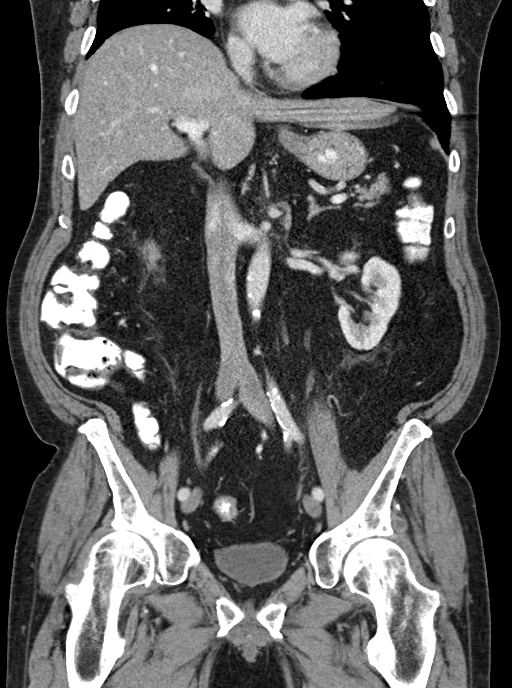

[15 of 46 positions shown; findings below may reference images not displayed]

FINDINGS: Lower chest: Improved lung volumes compared to 0154. Patchy opacity
at both lung bases appears to be chronic to a degree, greater on the
left. Patchy peribronchial left lower lobe involvement though
resembles bronchopneumonia on series 3, image 12. Some of the
changes most resemble chronic scarring, including along the right
minor fissure. No pericardial or pleural effusion. Calcified
coronary artery atherosclerosis.

Hepatobiliary: Surgically absent gallbladder. Chronic hepatic
steatosis.

Pancreas: Negative; partially fatty atrophied.

Spleen: Negative.

Adrenals/Urinary Tract: Normal adrenal glands. Bilateral renal
enhancement and contrast excretion is symmetric and normal. There is
a 4-5 mm left lower pole renal calculus. No right nephrolithiasis.
Mild bilateral perinephric stranding appears likely age related. No
hydronephrosis. Normal left ureter. Negative course also of the
right ureter.

Diminutive and unremarkable urinary bladder.

Stomach/Bowel: Oral contrast has reached the rectum without
obstruction. The rectum is decompressed and negative. There is mild
to moderate diverticulosis in the sigmoid colon. The sigmoid wall
appears mildly thickened such as on series 2, image 82, but there is
no convincing mesenteric inflammation.

Negative left colon and transverse colon. Negative right colon and
terminal ileum. The appendix is diminutive or absent. No dilated or
inflamed small bowel loops. Negative stomach and duodenum ; subtle
duodenum diverticulum at the third portion in the midline on series
2, image 46.

No abdominal free fluid or free air.

Vascular/Lymphatic: Aortoiliac calcified atherosclerosis. Major
arterial structures in the abdomen and pelvis are patent. Portal
venous system is patent.

No lymphadenopathy.

Reproductive: Negative.

Other: No pelvic free fluid.

Musculoskeletal: Osteopenia. Lumbar spine degeneration, especially
in the posterior elements. Hip joint degeneration, advanced on the
right. No acute osseous abnormality identified.
IMPRESSION: 1. Sigmoid diverticulosis with perhaps mild sigmoid wall thickening.
Mild or resolving diverticulitis or colitis is possible, but there
is no mesenteric inflammation. See also #2.
2. No other acute or inflammatory process in the abdomen or pelvis,
however, patchy opacity in the left lower lobe could reflect
Bronchopneumonia. No left pleural effusion. Query cough.
3. Aortic Atherosclerosis ([id number]). Fatty liver disease. Left
nephrolithiasis. Lumbar facet and right hip degeneration.

## 2019-08-20 ENCOUNTER — Other Ambulatory Visit: Payer: Self-pay | Admitting: Internal Medicine

## 2019-08-20 DIAGNOSIS — I10 Essential (primary) hypertension: Secondary | ICD-10-CM

## 2019-08-20 DIAGNOSIS — E785 Hyperlipidemia, unspecified: Secondary | ICD-10-CM

## 2019-08-31 ENCOUNTER — Telehealth: Payer: Self-pay | Admitting: Internal Medicine

## 2019-08-31 ENCOUNTER — Other Ambulatory Visit: Payer: Self-pay | Admitting: Internal Medicine

## 2019-08-31 DIAGNOSIS — E785 Hyperlipidemia, unspecified: Secondary | ICD-10-CM

## 2019-08-31 DIAGNOSIS — I10 Essential (primary) hypertension: Secondary | ICD-10-CM

## 2019-08-31 MED ORDER — ATORVASTATIN CALCIUM 20 MG PO TABS: 20.0000 mg | ORAL_TABLET | Freq: Every day | ORAL | 0 refills | Status: DC

## 2019-08-31 MED ORDER — OLMESARTAN MEDOXOMIL-HCTZ 40-12.5 MG PO TABS: 0.5000 | ORAL_TABLET | Freq: Every day | ORAL | 0 refills | Status: DC

## 2019-08-31 NOTE — Telephone Encounter (Signed)
New message:   1.Medication Requested: olmesartan-hydrochlorothiazide (BENICAR HCT) 40-12.5 MG tablet atorvastatin (LIPITOR) 20 MG tablet 2. Pharmacy (Name, Street, [place]): [facility] [place] [place] 3. On Med List: Yes  4. Last Visit with PCP:   5. Next visit date with PCP: 09/08/19   Agent: Please be advised that RX refills may take up to 3 business days. We ask that you follow-up with your pharmacy.

## 2019-09-06 DIAGNOSIS — J4540 Moderate persistent asthma, uncomplicated: Secondary | ICD-10-CM | POA: Diagnosis not present

## 2019-09-07 ENCOUNTER — Ambulatory Visit (INDEPENDENT_AMBULATORY_CARE_PROVIDER_SITE_OTHER): Payer: Medicare Other | Admitting: *Deleted

## 2019-09-07 ENCOUNTER — Other Ambulatory Visit: Payer: Self-pay

## 2019-09-07 DIAGNOSIS — J4540 Moderate persistent asthma, uncomplicated: Secondary | ICD-10-CM

## 2019-09-07 DIAGNOSIS — B078 Other viral warts: Secondary | ICD-10-CM | POA: Diagnosis not present

## 2019-09-07 DIAGNOSIS — L820 Inflamed seborrheic keratosis: Secondary | ICD-10-CM | POA: Diagnosis not present

## 2019-09-07 MED ORDER — OMALIZUMAB 150 MG ~~LOC~~ SOLR
375.0000 mg | SUBCUTANEOUS | Status: DC
  Administered 2019-09-07 – 2020-06-19 (×20): 375 mg via SUBCUTANEOUS

## 2019-09-08 ENCOUNTER — Other Ambulatory Visit: Payer: Self-pay

## 2019-09-08 ENCOUNTER — Encounter: Payer: Self-pay | Admitting: Internal Medicine

## 2019-09-08 ENCOUNTER — Ambulatory Visit (INDEPENDENT_AMBULATORY_CARE_PROVIDER_SITE_OTHER): Payer: Medicare Other | Admitting: Internal Medicine

## 2019-09-08 VITALS — BP 124/60 | HR 97 | Temp 98.1°F | Resp 16 | Ht 68.0 in | Wt 210.0 lb

## 2019-09-08 DIAGNOSIS — M1000 Idiopathic gout, unspecified site: Secondary | ICD-10-CM | POA: Diagnosis not present

## 2019-09-08 DIAGNOSIS — E785 Hyperlipidemia, unspecified: Secondary | ICD-10-CM

## 2019-09-08 DIAGNOSIS — I10 Essential (primary) hypertension: Secondary | ICD-10-CM

## 2019-09-08 DIAGNOSIS — F3342 Major depressive disorder, recurrent, in full remission: Secondary | ICD-10-CM | POA: Diagnosis not present

## 2019-09-08 DIAGNOSIS — Z23 Encounter for immunization: Secondary | ICD-10-CM | POA: Diagnosis not present

## 2019-09-08 DIAGNOSIS — R7303 Prediabetes: Secondary | ICD-10-CM | POA: Diagnosis not present

## 2019-09-08 LAB — CBC WITH DIFFERENTIAL/PLATELET
Basophils Absolute: 0 10*3/uL (ref 0.0–0.1)
Basophils Relative: 0.6 % (ref 0.0–3.0)
Eosinophils Absolute: 0.2 10*3/uL (ref 0.0–0.7)
Eosinophils Relative: 3 % (ref 0.0–5.0)
HCT: 43.4 % (ref 39.0–52.0)
Hemoglobin: 14.8 g/dL (ref 13.0–17.0)
Lymphocytes Relative: 33.3 % (ref 12.0–46.0)
Lymphs Abs: 2.3 10*3/uL (ref 0.7–4.0)
MCHC: 34.1 g/dL (ref 30.0–36.0)
MCV: 98.7 fl (ref 78.0–100.0)
Monocytes Absolute: 0.6 10*3/uL (ref 0.1–1.0)
Monocytes Relative: 8.9 % (ref 3.0–12.0)
Neutro Abs: 3.8 10*3/uL (ref 1.4–7.7)
Neutrophils Relative %: 54.2 % (ref 43.0–77.0)
Platelets: 156 10*3/uL (ref 150.0–400.0)
RBC: 4.4 Mil/uL (ref 4.22–5.81)
RDW: 15.4 % (ref 11.5–15.5)
WBC: 7 10*3/uL (ref 4.0–10.5)

## 2019-09-08 LAB — BASIC METABOLIC PANEL
BUN: 22 mg/dL (ref 6–23)
CO2: 27 mEq/L (ref 19–32)
Calcium: 9.5 mg/dL (ref 8.4–10.5)
Chloride: 101 mEq/L (ref 96–112)
Creatinine, Ser: 1.17 mg/dL (ref 0.40–1.50)
GFR: 60.27 mL/min (ref >60.00)
Glucose, Bld: 138 mg/dL — ABNORMAL HIGH (ref 70–99)
Potassium: 3.9 mEq/L (ref 3.5–5.1)
Sodium: 138 mEq/L (ref 135–145)

## 2019-09-08 LAB — LIPID PANEL
Cholesterol: 121 mg/dL (ref 0–200)
HDL: 51.7 mg/dL (ref >39.00)
LDL Cholesterol: 43 mg/dL (ref 0–99)
NonHDL: 69.67
Total CHOL/HDL Ratio: 2
Triglycerides: 132 mg/dL (ref 0.0–149.0)
VLDL: 26.4 mg/dL (ref 0.0–40.0)

## 2019-09-08 LAB — HEPATIC FUNCTION PANEL
ALT: 25 U/L (ref 0–53)
AST: 34 U/L (ref 0–37)
Albumin: 4.5 g/dL (ref 3.5–5.2)
Alkaline Phosphatase: 52 U/L (ref 39–117)
Bilirubin, Direct: 0.2 mg/dL (ref 0.0–0.3)
Total Bilirubin: 0.6 mg/dL (ref 0.2–1.2)
Total Protein: 7.1 g/dL (ref 6.0–8.3)

## 2019-09-08 LAB — HEMOGLOBIN A1C: Hgb A1c MFr Bld: 5.7 % (ref 4.6–6.5)

## 2019-09-08 LAB — TSH: TSH: 1.79 u[IU]/mL (ref 0.35–4.50)

## 2019-09-08 MED ORDER — ALLOPURINOL 300 MG PO TABS: 300.0000 mg | ORAL_TABLET | Freq: Every day | ORAL | 1 refills | Status: DC

## 2019-09-08 MED ORDER — FLUOXETINE HCL 10 MG PO CAPS: 10.0000 mg | ORAL_CAPSULE | Freq: Every day | ORAL | 1 refills | Status: DC

## 2019-09-08 MED ORDER — ATORVASTATIN CALCIUM 20 MG PO TABS: 20.0000 mg | ORAL_TABLET | Freq: Every day | ORAL | 1 refills | Status: DC

## 2019-09-08 MED ORDER — OLMESARTAN MEDOXOMIL-HCTZ 40-12.5 MG PO TABS: 0.5000 | ORAL_TABLET | Freq: Every day | ORAL | 1 refills | Status: DC

## 2019-09-08 NOTE — Progress Notes (Signed)
Subjective:  Patient ID: Edwin [name], male    DOB: 1940-11-16  Age: 79 y.o. MRN: [id number]  CC: Hypertension  This visit occurred during the SARS-CoV-2 public health emergency.  Safety protocols were in place, including screening questions prior to the visit, additional usage of staff PPE, and extensive cleaning of exam room while observing appropriate contact time as indicated for disinfecting solutions.    HPI Edwin [name] presents for f/up - He has a chronic nonproductive cough with rare wheezing and shortness of breath.  He is getting adequate symptom relief with his asthma treatments.  He is active and denies any recent episodes of CP, DOE, palpitations, edema, or fatigue.  He denies dizziness or lightheadedness.  Outpatient Medications Prior to Visit  Medication Sig Dispense Refill  . acyclovir (ZOVIRAX) 200 MG capsule Take 200 mg by mouth 4 (four) times daily as needed (for fever blisters/cold sores.).    [name] Kitchen albuterol (VENTOLIN HFA) 108 (90 Base) MCG/ACT inhaler INHALE 1-2 PUFFS INTO THE LUNGS EVERY 4 HOURS AS NEEDED FOR WHEEZING OR SHORTNESS OF BREATH 18 g 1  . brimonidine (ALPHAGAN P) 0.1 % SOLN Place 1 drop into both eyes 2 (two) times daily. Both eyes     . budesonide-formoterol (SYMBICORT) 80-4.5 MCG/ACT inhaler TAKE 2 PUFFS BY MOUTH TWICE A DAY 10.2 g 5  . Cyanocobalamin (VITAMIN B 12 PO) Take 1 tablet by mouth daily.    [name] Kitchen dextromethorphan-guaiFENesin (MUCINEX DM) 30-600 MG 12hr tablet Take 1 tablet by mouth 2 (two) times daily as needed for cough.     . EPINEPHrine 0.3 mg/0.3 mL IJ SOAJ injection Inject 0.3 mLs (0.3 mg total) into the muscle once. (Patient taking differently: Inject 0.3 mg into the muscle daily as needed (anaphylactic allergic reactions). ) 2 Device 1  . famotidine (PEPCID) 20 MG tablet TAKE 1 TABLET BY MOUTH EVERYDAY AT BEDTIME 90 tablet 1  . LUMIGAN 0.01 % SOLN     . mometasone (ELOCON) 0.1 % ointment Apply topically once daily as needed 45 g 1  .  omeprazole (PRILOSEC) 20 MG capsule Take 20 mg by mouth daily.    . pantoprazole (PROTONIX) 40 MG tablet TAKE 1 TABLET BY MOUTH DAILY IN THE MORNING 90 tablet 1  . psyllium (METAMUCIL) 58.6 % powder Take 1 packet by mouth daily.      [name] Kitchen allopurinol (ZYLOPRIM) 300 MG tablet TAKE 1 TABLET BY MOUTH EVERY DAY 90 tablet 1  . atorvastatin (LIPITOR) 20 MG tablet Take 1 tablet (20 mg total) by mouth daily. 30 tablet 0  . FLUoxetine (PROZAC) 10 MG capsule TAKE 1 CAPSULE BY MOUTH EVERY DAY 90 capsule 1  . olmesartan-hydrochlorothiazide (BENICAR HCT) 40-12.5 MG tablet Take 0.5 tablets by mouth daily. 15 tablet 0   Facility-Administered Medications Prior to Visit  Medication Dose Route Frequency Provider Last Rate Last Admin  . omalizumab Arvid Right) injection 375 mg  375 mg Subcutaneous Q14 Days Kozlow, Donnamarie Poag, MD   375 mg at 09/07/19 1200    ROS Review of Systems  Constitutional: Negative for diaphoresis, fatigue and unexpected weight change.  HENT: Negative.   Eyes: Negative for visual disturbance.  Respiratory: Positive for cough, shortness of breath and wheezing. Negative for choking, chest tightness and stridor.   Cardiovascular: Negative for chest pain, palpitations and leg swelling.  Gastrointestinal: Negative for abdominal pain, blood in stool, constipation, diarrhea, nausea and vomiting.  Endocrine: Negative.   Genitourinary: Negative.  Negative for difficulty urinating.  Musculoskeletal: Negative.  Negative  for arthralgias, back pain and myalgias.  Skin: Negative.  Negative for color change, pallor and rash.  Neurological: Negative.  Negative for dizziness, weakness and light-headedness.  Hematological: Negative for adenopathy. Does not bruise/bleed easily.  Psychiatric/Behavioral: Negative.     Objective:  BP 124/60 (BP Location: Left Arm, Patient Position: Sitting, Cuff Size: Large)   Pulse 97   Temp 98.1 F (36.7 C) (Oral)   Resp 16   Ht 5\' 8"  (1.727 m)   Wt 210 lb (95.3 kg)    SpO2 94%   BMI 31.93 kg/m   BP Readings from Last 3 Encounters:  09/08/19 124/60  06/28/19 112/74  09/28/18 118/78    Wt Readings from Last 3 Encounters:  09/08/19 210 lb (95.3 kg)  08/18/18 211 lb 12 oz (96 kg)  07/27/18 213 lb (96.6 kg)    Physical Exam Vitals reviewed.  Constitutional:      Appearance: Normal appearance.  HENT:     Nose: Nose normal.     Mouth/Throat:     Mouth: Mucous membranes are moist.  Eyes:     General: No scleral icterus.    Conjunctiva/sclera: Conjunctivae normal.  Cardiovascular:     Rate and Rhythm: Normal rate and regular rhythm.     Heart sounds: No murmur.  Pulmonary:     Effort: Pulmonary effort is normal.     Breath sounds: No stridor. No wheezing, rhonchi or rales.  Abdominal:     General: Abdomen is flat.     Palpations: There is no mass.     Tenderness: There is no abdominal tenderness. There is no guarding or rebound.     Hernia: No hernia is present.  Musculoskeletal:        General: Normal range of motion.     Cervical back: Neck supple.     Right lower leg: No edema.     Left lower leg: No edema.  Lymphadenopathy:     Cervical: No cervical adenopathy.  Skin:    General: Skin is warm and dry.  Neurological:     General: No focal deficit present.     Mental Status: He is alert.  Psychiatric:        Mood and Affect: Mood normal.        Behavior: Behavior normal.     Lab Results  Component Value Date   WBC 7.0 09/08/2019   HGB 14.8 09/08/2019   HCT 43.4 09/08/2019   PLT 156.0 09/08/2019   GLUCOSE 138 (H) 09/08/2019   CHOL 121 09/08/2019   TRIG 132.0 09/08/2019   HDL 51.70 09/08/2019   LDLDIRECT 102.0 03/03/2016   LDLCALC 43 09/08/2019   ALT 25 09/08/2019   AST 34 09/08/2019   NA 138 09/08/2019   K 3.9 09/08/2019   CL 101 09/08/2019   CREATININE 1.17 09/08/2019   BUN 22 09/08/2019   CO2 27 09/08/2019   TSH 1.79 09/08/2019   PSA 0.87 05/05/2016   HGBA1C 5.7 09/08/2019    CT Chest High  Resolution  Result Date: 11/03/2018 CLINICAL DATA:  COPD, ILD, increasing shortness of breath, follow-up prior examination EXAM: CT CHEST WITHOUT CONTRAST TECHNIQUE: Multidetector CT imaging of the chest was performed following the standard protocol without intravenous contrast. High resolution imaging of the lungs, as well as inspiratory and expiratory imaging, was performed. COMPARISON:  08/24/2017, 01/06/2016, 03/17/2011 FINDINGS: Cardiovascular: Scattered aortic atherosclerosis. Normal heart size. Three-vessel coronary artery calcifications. No pericardial effusion. Mediastinum/Nodes: No enlarged mediastinal, hilar, or axillary lymph nodes. Thyroid  gland, trachea, and esophagus demonstrate no significant findings. Lungs/Pleura: Mild centrilobular emphysema. Unchanged elevation of the right hemidiaphragm with right greater than left bibasilar scarring and minimal subpleural interstitial reticular opacity, in general very slightly increased on examinations dating back to 2012 and not significantly changed in the immediate interval. A ground-glass opacity previously seen in the left upper lobe is resolved. There are additional stable small pulmonary nodules, for example a 3 mm nodule in the right upper lobe (series 8, image 20). No pleural effusion or pneumothorax. Upper Abdomen: No acute abnormality. Musculoskeletal: No chest wall mass or suspicious bone lesions identified. IMPRESSION: 1. Unchanged elevation of the right hemidiaphragm with right greater than left bibasilar scarring and minimal subpleural interstitial reticular opacity, in general very slightly increased on examinations dating back to 2012 and not significantly changed in the immediate interval. This may reflect post infectious or inflammatory scarring, although mild pulmonary fibrosis in an "indeterminate for UIP" pattern is not excluded. 2. A ground-glass opacity previously seen in the left upper lobe is resolved. There are additional stable  small pulmonary nodules, for example a 3 mm nodule in the right upper lobe (series 8, image 20). Attention on follow-up. 3.  Emphysema. 4.  Coronary artery disease. Electronically Signed   By: Eddie Candle M.D.   On: 11/03/2018 15:59    Assessment & Plan:   Jaion was seen today for hypertension.  Diagnoses and all orders for this visit:  Idiopathic gout, unspecified chronicity, unspecified site- He has achieved his uric acid goal and is doing well on allopurinol.  Will continue the current dose. -     allopurinol (ZYLOPRIM) 300 MG tablet; Take 1 tablet (300 mg total) by mouth daily.  Hyperlipidemia with target LDL less than 130- He has achieved his LDL goal and is doing well on the statin. -     atorvastatin (LIPITOR) 20 MG tablet; Take 1 tablet (20 mg total) by mouth daily. -     Lipid panel; Future -     Hepatic function panel; Future -     TSH; Future -     TSH -     Hepatic function panel -     Lipid panel  Essential hypertension- His blood pressure is well controlled.  Electrolytes and renal function are normal. -     olmesartan-hydrochlorothiazide (BENICAR HCT) 40-12.5 MG tablet; Take 0.5 tablets by mouth daily. -     CBC with Differential/Platelet; Future -     Basic metabolic panel; Future -     TSH; Future -     TSH -     Basic metabolic panel -     CBC with Differential/Platelet  Prediabetes- His blood sugars are normal now. -     Hemoglobin A1c; Future -     Hemoglobin A1c  Recurrent major depressive disorder, in full remission ([place])- He is doing well on the current dose of fluoxetine.  Will continue. -     FLUoxetine (PROZAC) 10 MG capsule; Take 1 capsule (10 mg total) by mouth daily. -     CBC with Differential/Platelet; Future -     Hepatic function panel; Future -     TSH; Future -     TSH -     Hepatic function panel -     CBC with Differential/Platelet  Need for pneumococcal vaccination -     Pneumococcal polysaccharide vaccine 23-valent greater than or  equal to 2yo subcutaneous/IM   I have changed Jef W. Raker's allopurinol  and FLUoxetine. I am also having him maintain his brimonidine, psyllium, dextromethorphan-guaiFENesin, EPINEPHrine, acyclovir, Lumigan, Cyanocobalamin (VITAMIN B 12 PO), omeprazole, budesonide-formoterol, famotidine, pantoprazole, mometasone, albuterol, atorvastatin, and olmesartan-hydrochlorothiazide. We will continue to administer omalizumab.  Meds ordered this encounter  Medications  . allopurinol (ZYLOPRIM) 300 MG tablet    Sig: Take 1 tablet (300 mg total) by mouth daily.    Dispense:  90 tablet    Refill:  1  . atorvastatin (LIPITOR) 20 MG tablet    Sig: Take 1 tablet (20 mg total) by mouth daily.    Dispense:  90 tablet    Refill:  1  . FLUoxetine (PROZAC) 10 MG capsule    Sig: Take 1 capsule (10 mg total) by mouth daily.    Dispense:  90 capsule    Refill:  1  . olmesartan-hydrochlorothiazide (BENICAR HCT) 40-12.5 MG tablet    Sig: Take 0.5 tablets by mouth daily.    Dispense:  45 tablet    Refill:  1     Follow-up: Return in about 6 months (around 03/09/2020).  Scarlette Calico, MD

## 2019-09-08 NOTE — Patient Instructions (Signed)

## 2019-09-20 DIAGNOSIS — J4540 Moderate persistent asthma, uncomplicated: Secondary | ICD-10-CM

## 2019-09-21 ENCOUNTER — Ambulatory Visit (INDEPENDENT_AMBULATORY_CARE_PROVIDER_SITE_OTHER): Payer: Medicare Other

## 2019-09-21 ENCOUNTER — Other Ambulatory Visit: Payer: Self-pay

## 2019-09-21 DIAGNOSIS — J4540 Moderate persistent asthma, uncomplicated: Secondary | ICD-10-CM | POA: Diagnosis not present

## 2019-10-07 ENCOUNTER — Ambulatory Visit: Payer: Self-pay

## 2019-10-11 ENCOUNTER — Telehealth: Payer: Self-pay | Admitting: Internal Medicine

## 2019-10-11 DIAGNOSIS — J4540 Moderate persistent asthma, uncomplicated: Secondary | ICD-10-CM | POA: Diagnosis not present

## 2019-10-11 NOTE — Progress Notes (Signed)
°  Chronic Care Management   Note  10/11/2019 Name: Edwin [name] MRN: [id number] DOB: 12/17/1940  Edwin [name] is a 79 y.o. year old male who is a primary care patient of Janith Lima, MD. I reached out to [employer] by phone today in response to a referral sent by Edwin [name]'s PCP, Janith Lima, MD.   Edwin [name] was given information about Chronic Care Management services today including:  1. CCM service includes personalized support from designated clinical staff supervised by his physician, including individualized plan of care and coordination with other care providers 2. 24/7 contact phone numbers for assistance for urgent and routine care needs. 3. Service will only be billed when office clinical staff spend 20 minutes or more in a month to coordinate care. 4. Only one practitioner may furnish and bill the service in a calendar month. 5. The patient may stop CCM services at any time (effective at the end of the month) by phone call to the office staff.   Patient wishes to consider information provided and/or speak with a member of the care team before deciding about enrollment in care management services.   This note is not being shared with the patient for the following reason: To respect privacy (The patient or proxy has requested that the information not be shared).  Follow up plan:   Earney [place] [facility] Scheduler

## 2019-10-12 ENCOUNTER — Other Ambulatory Visit: Payer: Self-pay

## 2019-10-12 ENCOUNTER — Ambulatory Visit (INDEPENDENT_AMBULATORY_CARE_PROVIDER_SITE_OTHER): Payer: Medicare Other

## 2019-10-12 DIAGNOSIS — J4540 Moderate persistent asthma, uncomplicated: Secondary | ICD-10-CM | POA: Diagnosis not present

## 2019-10-25 DIAGNOSIS — J4540 Moderate persistent asthma, uncomplicated: Secondary | ICD-10-CM

## 2019-10-26 ENCOUNTER — Other Ambulatory Visit: Payer: Self-pay

## 2019-10-26 ENCOUNTER — Ambulatory Visit (INDEPENDENT_AMBULATORY_CARE_PROVIDER_SITE_OTHER): Payer: Medicare Other

## 2019-10-26 DIAGNOSIS — J4540 Moderate persistent asthma, uncomplicated: Secondary | ICD-10-CM

## 2019-11-08 DIAGNOSIS — J4540 Moderate persistent asthma, uncomplicated: Secondary | ICD-10-CM | POA: Diagnosis not present

## 2019-11-09 ENCOUNTER — Other Ambulatory Visit: Payer: Self-pay

## 2019-11-09 ENCOUNTER — Ambulatory Visit (INDEPENDENT_AMBULATORY_CARE_PROVIDER_SITE_OTHER): Payer: Medicare Other

## 2019-11-09 DIAGNOSIS — J4540 Moderate persistent asthma, uncomplicated: Secondary | ICD-10-CM | POA: Diagnosis not present

## 2019-11-22 DIAGNOSIS — J4540 Moderate persistent asthma, uncomplicated: Secondary | ICD-10-CM | POA: Diagnosis not present

## 2019-11-23 ENCOUNTER — Other Ambulatory Visit: Payer: Self-pay

## 2019-11-23 ENCOUNTER — Ambulatory Visit (INDEPENDENT_AMBULATORY_CARE_PROVIDER_SITE_OTHER): Payer: Medicare Other

## 2019-11-23 DIAGNOSIS — J4540 Moderate persistent asthma, uncomplicated: Secondary | ICD-10-CM

## 2019-12-15 DIAGNOSIS — J4540 Moderate persistent asthma, uncomplicated: Secondary | ICD-10-CM | POA: Diagnosis not present

## 2019-12-16 ENCOUNTER — Other Ambulatory Visit: Payer: Self-pay

## 2019-12-16 ENCOUNTER — Ambulatory Visit (INDEPENDENT_AMBULATORY_CARE_PROVIDER_SITE_OTHER): Payer: Medicare Other

## 2019-12-16 DIAGNOSIS — J4540 Moderate persistent asthma, uncomplicated: Secondary | ICD-10-CM

## 2020-01-02 ENCOUNTER — Ambulatory Visit: Payer: Self-pay

## 2020-01-02 DIAGNOSIS — J4540 Moderate persistent asthma, uncomplicated: Secondary | ICD-10-CM | POA: Diagnosis not present

## 2020-01-03 ENCOUNTER — Other Ambulatory Visit: Payer: Self-pay

## 2020-01-03 ENCOUNTER — Ambulatory Visit (INDEPENDENT_AMBULATORY_CARE_PROVIDER_SITE_OTHER): Payer: Medicare Other

## 2020-01-03 DIAGNOSIS — J4540 Moderate persistent asthma, uncomplicated: Secondary | ICD-10-CM | POA: Diagnosis not present

## 2020-01-05 ENCOUNTER — Ambulatory Visit: Payer: Self-pay

## 2020-01-06 ENCOUNTER — Other Ambulatory Visit: Payer: Self-pay | Admitting: Allergy and Immunology

## 2020-01-16 DIAGNOSIS — J4540 Moderate persistent asthma, uncomplicated: Secondary | ICD-10-CM

## 2020-01-17 ENCOUNTER — Ambulatory Visit (INDEPENDENT_AMBULATORY_CARE_PROVIDER_SITE_OTHER): Payer: Medicare Other

## 2020-01-17 ENCOUNTER — Ambulatory Visit: Payer: Self-pay

## 2020-01-17 ENCOUNTER — Other Ambulatory Visit: Payer: Self-pay

## 2020-01-17 DIAGNOSIS — J4540 Moderate persistent asthma, uncomplicated: Secondary | ICD-10-CM | POA: Diagnosis not present

## 2020-01-30 DIAGNOSIS — J4540 Moderate persistent asthma, uncomplicated: Secondary | ICD-10-CM | POA: Diagnosis not present

## 2020-01-31 ENCOUNTER — Other Ambulatory Visit: Payer: Self-pay

## 2020-01-31 ENCOUNTER — Encounter: Payer: Self-pay | Admitting: Allergy and Immunology

## 2020-01-31 ENCOUNTER — Ambulatory Visit (INDEPENDENT_AMBULATORY_CARE_PROVIDER_SITE_OTHER): Payer: Medicare Other | Admitting: Allergy and Immunology

## 2020-01-31 ENCOUNTER — Ambulatory Visit (INDEPENDENT_AMBULATORY_CARE_PROVIDER_SITE_OTHER): Payer: Medicare Other

## 2020-01-31 VITALS — BP 132/84 | HR 96 | Resp 18

## 2020-01-31 DIAGNOSIS — J324 Chronic pansinusitis: Secondary | ICD-10-CM

## 2020-01-31 DIAGNOSIS — J849 Interstitial pulmonary disease, unspecified: Secondary | ICD-10-CM

## 2020-01-31 DIAGNOSIS — J4540 Moderate persistent asthma, uncomplicated: Secondary | ICD-10-CM | POA: Diagnosis not present

## 2020-01-31 DIAGNOSIS — J3089 Other allergic rhinitis: Secondary | ICD-10-CM

## 2020-01-31 DIAGNOSIS — J449 Chronic obstructive pulmonary disease, unspecified: Secondary | ICD-10-CM

## 2020-01-31 DIAGNOSIS — K219 Gastro-esophageal reflux disease without esophagitis: Secondary | ICD-10-CM

## 2020-01-31 DIAGNOSIS — B001 Herpesviral vesicular dermatitis: Secondary | ICD-10-CM

## 2020-01-31 NOTE — Progress Notes (Signed)
[facility]   Follow-up Note  Referring Provider: Janith Lima, MD Primary Provider: Janith Lima, MD Date of Office Visit: 01/31/2020  Subjective:   Edwin [name] (DOB: 02-Jun-1941) is a 79 y.o. male who returns to the [facility] on 01/31/2020 in re-evaluation of the following:  HPI: [name] returns to this clinic in reevaluation of his COPD with component of asthma, interstitial lung disease, allergic rhinitis, history of chronic sinusitis, and history of recurrent fever blister and reflux/LPR.  His last visit to this clinic was 28 June 2019.  He still continues to have some cough and phlegm production and intermittent shortness of breath which has been a stable issue now for the past 8 months.  He may be a little bit worse regarding his shortness of breath recently.  He does not use a short acting bronchodilator.  He does continue to use inhaled Symbicort on a regular basis and omalizumab injections.  He has had very little problems with his nose.  He intermittently uses a nasal steroid.  His reflux is under very good control at this point in time.  He has had very few issues with his throat.  He still is having intermittent outbreaks of fever blister about every 8 weeks that response to the use of acyclovir.  He is asking for refill on those medications.  He has received 2 Pfizer Covid vaccinations.  Allergies as of 01/31/2020      Reactions   Ramipril    REACTION: chest congestion & cough. No angioedema      Medication List      acyclovir 200 MG capsule Commonly known as: ZOVIRAX Take 200 mg by mouth 4 (four) times daily as needed (for fever blisters/cold sores.).   albuterol 108 (90 Base) MCG/ACT inhaler Commonly known as: Ventolin HFA INHALE 1-2 PUFFS INTO THE LUNGS EVERY 4 HOURS AS NEEDED FOR WHEEZING OR SHORTNESS OF BREATH   allopurinol 300 MG tablet Commonly known as: ZYLOPRIM Take 1 tablet  (300 mg total) by mouth daily.   atorvastatin 20 MG tablet Commonly known as: LIPITOR Take 1 tablet (20 mg total) by mouth daily.   brimonidine 0.1 % Soln Commonly known as: ALPHAGAN P Place 1 drop into both eyes 2 (two) times daily. Both eyes   budesonide-formoterol 80-4.5 MCG/ACT inhaler Commonly known as: Symbicort TAKE 2 PUFFS BY MOUTH TWICE A DAY   dextromethorphan-guaiFENesin 30-600 MG 12hr tablet Commonly known as: MUCINEX DM Take 1 tablet by mouth 2 (two) times daily as needed for cough.   EPINEPHrine 0.3 mg/0.3 mL Soaj injection Commonly known as: EPI-PEN Inject 0.3 mLs (0.3 mg total) into the muscle once.   famotidine 20 MG tablet Commonly known as: PEPCID TAKE 1 TABLET BY MOUTH EVERYDAY AT BEDTIME   FLUoxetine 10 MG capsule Commonly known as: PROZAC Take 1 capsule (10 mg total) by mouth daily.   Lumigan 0.01 % Soln Generic drug: bimatoprost   mometasone 0.1 % ointment Commonly known as: ELOCON Apply topically once daily as needed   olmesartan-hydrochlorothiazide 40-12.5 MG tablet Commonly known as: BENICAR HCT Take 0.5 tablets by mouth daily.   omeprazole 20 MG capsule Commonly known as: PRILOSEC Take 20 mg by mouth daily.   pantoprazole 40 MG tablet Commonly known as: PROTONIX TAKE 1 TABLET BY MOUTH DAILY IN THE MORNING   psyllium 58.6 % powder Commonly known as: METAMUCIL Take 1 packet by mouth daily.   VITAMIN B  12 PO Take 1 tablet by mouth daily.       Past Medical History:  Diagnosis Date  . Asthma   . COPD (chronic obstructive pulmonary disease) (HCC)    reactive airway disease  . Glaucoma   . Gout   . Hx of colonic polyps    Dr Earlean Shawl  . Hyperlipidemia    NMR lipoprofile 2005: LDL 113(1416/825), HLD 37, TG 190.  [name] Kitchen Hypertension   . Pneumonia     Past Surgical History:  Procedure Laterality Date  . ANTERIOR CERVICAL DECOMP/DISCECTOMY FUSION N/A 03/08/2018   Procedure: ANTERIOR CERVICAL DECOMPRESSION/DISCECTOMY FUSION,  INTERBODY PROSTHESIS,PLATE/SCREWS CERVICAL FOUR- CERVICAL FIVE, CERVICAL FIVE- CERVICAL SIX, CERVICAL SIX- CERVICAL SEVEN;  Surgeon: Newman Pies, MD;  Location: [facility];  Service: Neurosurgery;  Laterality: N/A;  anterior  . CATARACT EXTRACTION     bilat, implants  . CHOLECYSTECTOMY  1995  . LUMBAR LAMINECTOMY  1982  . POLYPECTOMY  2003    neg 2006, Dr.Medoff  . tear duct surgery  1998   for excess tearing, skin graft L foot @ age 35 1/2    Review of systems negative except as noted in HPI / PMHx or noted below:  Review of Systems  Constitutional: Negative.   HENT: Negative.   Eyes: Negative.   Respiratory: Negative.   Cardiovascular: Negative.   Gastrointestinal: Negative.   Genitourinary: Negative.   Musculoskeletal: Negative.   Skin: Negative.   Neurological: Negative.   Endo/Heme/Allergies: Negative.   Psychiatric/Behavioral: Negative.      Objective:   Vitals:   01/31/20 1132  BP: 132/84  Pulse: 96  Resp: 18          Physical Exam Constitutional:      Appearance: He is not diaphoretic.  HENT:     Head: Normocephalic.     Right Ear: Tympanic membrane, ear canal and external ear normal.     Left Ear: Tympanic membrane, ear canal and external ear normal.     Nose: Nose normal. No mucosal edema or rhinorrhea.     Mouth/Throat:     Pharynx: Uvula midline. No oropharyngeal exudate.  Eyes:     Conjunctiva/sclera: Conjunctivae normal.  Neck:     Thyroid: No thyromegaly.     Trachea: Trachea normal. No tracheal tenderness or tracheal deviation.  Cardiovascular:     Rate and Rhythm: Normal rate and regular rhythm.     Heart sounds: Normal heart sounds, S1 normal and S2 normal. No murmur heard.   Pulmonary:     Effort: No respiratory distress.     Breath sounds: No stridor. Wheezing (bi- lateral expiratory wheezes all lung [place]) present. No rales.  Lymphadenopathy:     Head:     Right side of head: No tonsillar adenopathy.     Left side of head: No  tonsillar adenopathy.     Cervical: No cervical adenopathy.  Skin:    Findings: No erythema or rash.     Nails: There is no clubbing.  Neurological:     Mental Status: He is alert.     Diagnostics:    Spirometry was performed and demonstrated an FEV1 of 1.95 at 64 % of predicted.   Assessment and Plan:   1. COPD with asthma ([place])   2. Interstitial lung disease (HCC)   3. Other allergic rhinitis   4. Chronic pansinusitis   5. LPRD (laryngopharyngeal reflux disease)   6. Herpes labialis     1. Continue to Treat inflammation:   A.  START Breztri - 2 inhalations 2 times per day (Symbicort)  B. Xolair injections and EpiPen    2. Continue to Treat reflux:   A.  Pantoprazole 40 mg in the a.m. + famotidine 20 mg in the p.m.  3.  If needed:   A. ProAir HFA 2 puffs every 4-6 hours  B. nasal saline washes  C. OTC antihistamine  D. Mucinex DM 2 tablets twice a day  E. Duoneb nebulized every 4-6 hours if needed.  F. Nasacort one spray each nostril one time per day  G. Acyclovir 200 - 4 times a day during fever blister outbreak  5. Return to clinic in 3 months or earlier if problem    6. Obtain fall flu vaccine and Covid vaccine   Desi is stable although stability is defined by continued cough and wheeze and shortness of breath even in the face of utilizing a collection of anti-inflammatory agents for his airway.  We will now start him on a triple inhaler to replace his double controller inhaler.  He will continue on therapy for reflux.  We will see what type of response we get over the course the next 3 months.  Allena Katz, MD Allergy / Immunology [place]

## 2020-01-31 NOTE — Patient Instructions (Addendum)
   1. Continue to Treat inflammation:   A. START Breztri - 2 inhalations 2 times per day (Symbicort)  B. Xolair injections and EpiPen    2. Continue to Treat reflux:   A.  Pantoprazole 40 mg in the a.m. + famotidine 20 mg in the p.m.  3.  If needed:   A. ProAir HFA 2 puffs every 4-6 hours  B. nasal saline washes  C. OTC antihistamine  D. Mucinex DM 2 tablets twice a day  E. Duoneb nebulized every 4-6 hours if needed.  F. Nasacort one spray each nostril one time per day  G. Acyclovir 200 - 4 times a day during fever blister outbreak  5. Return to clinic in 3 months or earlier if problem    6. Obtain fall flu vaccine and Covid vaccine

## 2020-02-01 ENCOUNTER — Encounter: Payer: Self-pay | Admitting: Allergy and Immunology

## 2020-02-07 ENCOUNTER — Other Ambulatory Visit: Payer: Self-pay | Admitting: *Deleted

## 2020-02-07 ENCOUNTER — Telehealth: Payer: Self-pay | Admitting: Allergy and Immunology

## 2020-02-07 MED ORDER — BREZTRI AEROSPHERE 160-9-4.8 MCG/ACT IN AERO: 2.0000 | INHALATION_SPRAY | Freq: Two times a day (BID) | RESPIRATORY_TRACT | 5 refills | Status: DC

## 2020-02-07 MED ORDER — ACYCLOVIR 200 MG PO CAPS: ORAL_CAPSULE | ORAL | 5 refills | Status: DC

## 2020-02-07 NOTE — Telephone Encounter (Signed)
Prescriptions have been sent in. Called and left a detailed voicemail per DPR permission advising.

## 2020-02-07 NOTE — Telephone Encounter (Signed)
Patient states that Breztri and Acyclovir was suppose to be called in after his visit. Patient uses [facility] [address].  Please advise.

## 2020-02-08 ENCOUNTER — Ambulatory Visit (INDEPENDENT_AMBULATORY_CARE_PROVIDER_SITE_OTHER)
Admission: RE | Admit: 2020-02-08 | Discharge: 2020-02-08 | Disposition: A | Discharge status: Home / Self Care | Payer: Medicare Other | Source: Ambulatory Visit | Attending: Internal Medicine | Admitting: Internal Medicine

## 2020-02-08 ENCOUNTER — Other Ambulatory Visit: Payer: Self-pay

## 2020-02-08 ENCOUNTER — Telehealth (INDEPENDENT_AMBULATORY_CARE_PROVIDER_SITE_OTHER): Payer: Medicare Other | Admitting: Internal Medicine

## 2020-02-08 DIAGNOSIS — R05 Cough: Secondary | ICD-10-CM | POA: Diagnosis not present

## 2020-02-08 DIAGNOSIS — J449 Chronic obstructive pulmonary disease, unspecified: Secondary | ICD-10-CM

## 2020-02-08 DIAGNOSIS — R059 Cough, unspecified: Secondary | ICD-10-CM | POA: Insufficient documentation

## 2020-02-08 DIAGNOSIS — R7303 Prediabetes: Secondary | ICD-10-CM

## 2020-02-08 DIAGNOSIS — R062 Wheezing: Secondary | ICD-10-CM | POA: Diagnosis not present

## 2020-02-08 MED ORDER — PREDNISONE 10 MG PO TABS: ORAL_TABLET | ORAL | 0 refills | Status: DC

## 2020-02-08 MED ORDER — LEVOFLOXACIN 500 MG PO TABS: 500.0000 mg | ORAL_TABLET | Freq: Every day | ORAL | 0 refills | Status: AC

## 2020-02-08 NOTE — Progress Notes (Signed)
Patient ID: Edwin [name], male   DOB: [birth year], 79 y.o.   MRN: [id number]  Virtual Visit via Video Note  I connected with Edwin [name] on 02/08/20 at  2:00 PM EDT by a video enabled telemedicine application and verified that I am speaking with the correct person using two identifiers.  Location of all participants today Patient: at home Provider: at office   I discussed the limitations of evaluation and management by telemedicine and the availability of in person appointments. The patient expressed understanding and agreed to proceed.  History of Present Illness: Here with acute onset mild to mod 2-3 days ST, HA, general weakness and malaise, with prod cough greenish sputum, but Pt denies chest pain, increased sob or doe, wheezing, orthopnea, PND, increased LE swelling, palpitations, dizziness or syncope, except for mild wheezing x 2 days as well with some sob  Also mentions fever, aches, abd pains and nausea.  No diarrhea or vomiting.  Has been covid neg x 3, the most recent 3 days ago.    Pt denies polydipsia, polyuria  Past Medical History:  Diagnosis Date  . Asthma   . COPD (chronic obstructive pulmonary disease) (HCC)    reactive airway disease  . Glaucoma   . Gout   . Hx of colonic polyps    Dr Earlean Shawl  . Hyperlipidemia    NMR lipoprofile 2005: LDL 113(1416/825), HLD 37, TG 190.  [name] Kitchen Hypertension   . Pneumonia    Past Surgical History:  Procedure Laterality Date  . ANTERIOR CERVICAL DECOMP/DISCECTOMY FUSION N/A 03/08/2018   Procedure: ANTERIOR CERVICAL DECOMPRESSION/DISCECTOMY FUSION, INTERBODY PROSTHESIS,PLATE/SCREWS CERVICAL FOUR- CERVICAL FIVE, CERVICAL FIVE- CERVICAL SIX, CERVICAL SIX- CERVICAL SEVEN;  Surgeon: Newman Pies, MD;  Location: [facility];  Service: Neurosurgery;  Laterality: N/A;  anterior  . CATARACT EXTRACTION     bilat, implants  . CHOLECYSTECTOMY  1995  . LUMBAR LAMINECTOMY  1982  . POLYPECTOMY  2003    neg 2006, Dr.Medoff  . tear duct surgery  1998    for excess tearing, skin graft L foot @ age 61 1/2    reports that he quit smoking about 36 years ago. His smoking use included cigarettes. He has a 50.00 pack-year smoking history. He has never used smokeless tobacco. He reports that he does not drink alcohol and does not use drugs. family history includes Asthma in his father and son; Cancer in his mother; Emphysema in his father; Heart attack (age of onset: 28) in his father; Hypertension in his mother. Allergies  Allergen Reactions  . Ramipril     REACTION: chest congestion & cough. No angioedema   Current Outpatient Medications on File Prior to Visit  Medication Sig Dispense Refill  . acyclovir (ZOVIRAX) 200 MG capsule Take 200 mg by mouth 4 (four) times daily as needed (for fever blisters/cold sores.).    [name] Kitchen acyclovir (ZOVIRAX) 200 MG capsule Take 4 times daily during fever blister outbreak. 120 capsule 5  . albuterol (VENTOLIN HFA) 108 (90 Base) MCG/ACT inhaler INHALE 1-2 PUFFS INTO THE LUNGS EVERY 4 HOURS AS NEEDED FOR WHEEZING OR SHORTNESS OF BREATH 18 g 1  . allopurinol (ZYLOPRIM) 300 MG tablet Take 1 tablet (300 mg total) by mouth daily. 90 tablet 1  . atorvastatin (LIPITOR) 20 MG tablet Take 1 tablet (20 mg total) by mouth daily. 90 tablet 1  . brimonidine (ALPHAGAN P) 0.1 % SOLN Place 1 drop into both eyes 2 (two) times daily. Both eyes     .  Budeson-Glycopyrrol-Formoterol (BREZTRI AEROSPHERE) 160-9-4.8 MCG/ACT AERO Inhale 2 puffs into the lungs in the morning and at bedtime. 10.7 g 5  . budesonide-formoterol (SYMBICORT) 80-4.5 MCG/ACT inhaler TAKE 2 PUFFS BY MOUTH TWICE A DAY 1 each 0  . Cyanocobalamin (VITAMIN B 12 PO) Take 1 tablet by mouth daily.    [name] Kitchen dextromethorphan-guaiFENesin (MUCINEX DM) 30-600 MG 12hr tablet Take 1 tablet by mouth 2 (two) times daily as needed for cough.     . EPINEPHrine 0.3 mg/0.3 mL IJ SOAJ injection Inject 0.3 mLs (0.3 mg total) into the muscle once. (Patient taking differently: Inject 0.3 mg into the  muscle daily as needed (anaphylactic allergic reactions). ) 2 Device 1  . famotidine (PEPCID) 20 MG tablet TAKE 1 TABLET BY MOUTH EVERYDAY AT BEDTIME 90 tablet 1  . FLUoxetine (PROZAC) 10 MG capsule Take 1 capsule (10 mg total) by mouth daily. 90 capsule 1  . LUMIGAN 0.01 % SOLN     . mometasone (ELOCON) 0.1 % ointment Apply topically once daily as needed 45 g 1  . olmesartan-hydrochlorothiazide (BENICAR HCT) 40-12.5 MG tablet Take 0.5 tablets by mouth daily. 45 tablet 1  . omeprazole (PRILOSEC) 20 MG capsule Take 20 mg by mouth daily.    . pantoprazole (PROTONIX) 40 MG tablet TAKE 1 TABLET BY MOUTH DAILY IN THE MORNING 90 tablet 1  . psyllium (METAMUCIL) 58.6 % powder Take 1 packet by mouth daily.       Current Facility-Administered Medications on File Prior to Visit  Medication Dose Route Frequency Provider Last Rate Last Admin  . omalizumab Arvid Right) injection 375 mg  375 mg Subcutaneous Q14 Days Jiles Prows, MD   375 mg at 01/31/20 1111   Observations/Objective: Alert, NAD, appropriate mood and affect, resps normal, cn 2-12 intact, moves all 4s, no visible rash or swelling Lab Results  Component Value Date   WBC 7.0 09/08/2019   HGB 14.8 09/08/2019   HCT 43.4 09/08/2019   PLT 156.0 09/08/2019   GLUCOSE 138 (H) 09/08/2019   CHOL 121 09/08/2019   TRIG 132.0 09/08/2019   HDL 51.70 09/08/2019   LDLDIRECT 102.0 03/03/2016   LDLCALC 43 09/08/2019   ALT 25 09/08/2019   AST 34 09/08/2019   NA 138 09/08/2019   K 3.9 09/08/2019   CL 101 09/08/2019   CREATININE 1.17 09/08/2019   BUN 22 09/08/2019   CO2 27 09/08/2019   TSH 1.79 09/08/2019   PSA 0.87 05/05/2016   HGBA1C 5.7 09/08/2019   Assessment and Plan: See notes  Follow Up Instructions: See notes   I discussed the assessment and treatment plan with the patient. The patient was provided an opportunity to ask questions and all were answered. The patient agreed with the plan and demonstrated an understanding of the  instructions.   The patient was advised to call back or seek an in-person evaluation if the symptoms worsen or if the condition fails to improve as anticipated  Cathlean Cower, MD

## 2020-02-09 ENCOUNTER — Encounter: Payer: Self-pay | Admitting: Internal Medicine

## 2020-02-13 ENCOUNTER — Encounter: Payer: Self-pay | Admitting: Internal Medicine

## 2020-02-13 DIAGNOSIS — J4540 Moderate persistent asthma, uncomplicated: Secondary | ICD-10-CM | POA: Diagnosis not present

## 2020-02-13 NOTE — Assessment & Plan Note (Addendum)
Mild to mod, cw bronchitis vs pna, declines cxr,  for antibx course,  to f/u any worsening symptoms or concerns  I spent 31 minutes in preparing to see the patient by review of recent labs, imaging and procedures, obtaining and reviewing separately obtained history, communicating with the patient and family or caregiver, ordering medications, tests or procedures, and documenting clinical information in the EHR including the differential Dx, treatment, and any further evaluation and other management of cough, wheezing, hyperglycemia

## 2020-02-13 NOTE — Assessment & Plan Note (Signed)
stable overall by history and exam, recent data reviewed with pt, and pt to continue medical treatment as before,  to f/u any worsening symptoms or concerns  

## 2020-02-13 NOTE — Assessment & Plan Note (Signed)
Now with mild wheeze as well  - for predpac asd,  to f/u any worsening symptoms or concerns

## 2020-02-13 NOTE — Patient Instructions (Signed)
Please take all new medication as prescribed 

## 2020-02-14 ENCOUNTER — Other Ambulatory Visit: Payer: Self-pay

## 2020-02-14 ENCOUNTER — Ambulatory Visit (INDEPENDENT_AMBULATORY_CARE_PROVIDER_SITE_OTHER): Payer: Medicare Other | Admitting: *Deleted

## 2020-02-14 DIAGNOSIS — J4540 Moderate persistent asthma, uncomplicated: Secondary | ICD-10-CM | POA: Diagnosis not present

## 2020-02-16 DIAGNOSIS — Z23 Encounter for immunization: Secondary | ICD-10-CM | POA: Diagnosis not present

## 2020-02-20 DIAGNOSIS — Z23 Encounter for immunization: Secondary | ICD-10-CM | POA: Diagnosis not present

## 2020-02-22 ENCOUNTER — Ambulatory Visit: Payer: Self-pay

## 2020-02-27 DIAGNOSIS — J4540 Moderate persistent asthma, uncomplicated: Secondary | ICD-10-CM | POA: Diagnosis not present

## 2020-02-28 ENCOUNTER — Other Ambulatory Visit: Payer: Self-pay

## 2020-02-28 ENCOUNTER — Ambulatory Visit (INDEPENDENT_AMBULATORY_CARE_PROVIDER_SITE_OTHER): Payer: Medicare Other | Admitting: *Deleted

## 2020-02-28 DIAGNOSIS — J4540 Moderate persistent asthma, uncomplicated: Secondary | ICD-10-CM

## 2020-03-02 ENCOUNTER — Other Ambulatory Visit: Payer: Self-pay | Admitting: Internal Medicine

## 2020-03-02 DIAGNOSIS — I10 Essential (primary) hypertension: Secondary | ICD-10-CM

## 2020-03-02 DIAGNOSIS — E785 Hyperlipidemia, unspecified: Secondary | ICD-10-CM

## 2020-03-04 ENCOUNTER — Other Ambulatory Visit: Payer: Self-pay | Admitting: Internal Medicine

## 2020-03-04 DIAGNOSIS — I10 Essential (primary) hypertension: Secondary | ICD-10-CM

## 2020-03-04 DIAGNOSIS — E785 Hyperlipidemia, unspecified: Secondary | ICD-10-CM

## 2020-03-04 MED ORDER — ATORVASTATIN CALCIUM 20 MG PO TABS: 20.0000 mg | ORAL_TABLET | Freq: Every day | ORAL | 0 refills | Status: DC

## 2020-03-04 MED ORDER — OLMESARTAN MEDOXOMIL-HCTZ 40-12.5 MG PO TABS: 0.5000 | ORAL_TABLET | Freq: Every day | ORAL | 0 refills | Status: DC

## 2020-03-09 ENCOUNTER — Other Ambulatory Visit: Payer: Self-pay | Admitting: Allergy and Immunology

## 2020-03-10 ENCOUNTER — Other Ambulatory Visit: Payer: Self-pay | Admitting: Internal Medicine

## 2020-03-10 DIAGNOSIS — M1000 Idiopathic gout, unspecified site: Secondary | ICD-10-CM

## 2020-03-13 ENCOUNTER — Ambulatory Visit: Payer: Self-pay

## 2020-03-14 DIAGNOSIS — J4540 Moderate persistent asthma, uncomplicated: Secondary | ICD-10-CM | POA: Diagnosis not present

## 2020-03-15 ENCOUNTER — Ambulatory Visit (INDEPENDENT_AMBULATORY_CARE_PROVIDER_SITE_OTHER): Payer: Medicare Other

## 2020-03-15 ENCOUNTER — Other Ambulatory Visit: Payer: Self-pay

## 2020-03-15 DIAGNOSIS — J4540 Moderate persistent asthma, uncomplicated: Secondary | ICD-10-CM | POA: Diagnosis not present

## 2020-03-26 ENCOUNTER — Other Ambulatory Visit: Payer: Self-pay | Admitting: Internal Medicine

## 2020-03-26 ENCOUNTER — Telehealth: Payer: Self-pay | Admitting: Internal Medicine

## 2020-03-26 DIAGNOSIS — M1000 Idiopathic gout, unspecified site: Secondary | ICD-10-CM

## 2020-03-26 MED ORDER — ALLOPURINOL 300 MG PO TABS: 300.0000 mg | ORAL_TABLET | Freq: Every day | ORAL | 1 refills | Status: DC

## 2020-03-26 NOTE — Telephone Encounter (Signed)
allopurinol (ZYLOPRIM) 300 MG tablet [facility] [id number] [facility] [address] [place], [place] Phone:  [phone]  Fax:  [phone]     Patient requesting a refill

## 2020-03-27 DIAGNOSIS — J4540 Moderate persistent asthma, uncomplicated: Secondary | ICD-10-CM | POA: Diagnosis not present

## 2020-03-28 ENCOUNTER — Other Ambulatory Visit: Payer: Self-pay

## 2020-03-28 ENCOUNTER — Ambulatory Visit (INDEPENDENT_AMBULATORY_CARE_PROVIDER_SITE_OTHER): Payer: Medicare Other | Admitting: *Deleted

## 2020-03-28 DIAGNOSIS — J4540 Moderate persistent asthma, uncomplicated: Secondary | ICD-10-CM

## 2020-04-04 ENCOUNTER — Other Ambulatory Visit: Payer: Self-pay | Admitting: Allergy and Immunology

## 2020-04-09 DIAGNOSIS — J4540 Moderate persistent asthma, uncomplicated: Secondary | ICD-10-CM | POA: Diagnosis not present

## 2020-04-10 ENCOUNTER — Other Ambulatory Visit: Payer: Self-pay

## 2020-04-10 ENCOUNTER — Ambulatory Visit (INDEPENDENT_AMBULATORY_CARE_PROVIDER_SITE_OTHER): Payer: Medicare Other | Admitting: *Deleted

## 2020-04-10 DIAGNOSIS — J4540 Moderate persistent asthma, uncomplicated: Secondary | ICD-10-CM

## 2020-04-12 ENCOUNTER — Ambulatory Visit: Payer: Self-pay

## 2020-04-23 DIAGNOSIS — J4540 Moderate persistent asthma, uncomplicated: Secondary | ICD-10-CM | POA: Diagnosis not present

## 2020-04-24 ENCOUNTER — Ambulatory Visit (INDEPENDENT_AMBULATORY_CARE_PROVIDER_SITE_OTHER): Payer: Medicare Other | Admitting: *Deleted

## 2020-04-24 ENCOUNTER — Other Ambulatory Visit: Payer: Self-pay

## 2020-04-24 DIAGNOSIS — J4540 Moderate persistent asthma, uncomplicated: Secondary | ICD-10-CM

## 2020-04-28 ENCOUNTER — Other Ambulatory Visit: Payer: Self-pay | Admitting: Internal Medicine

## 2020-04-28 DIAGNOSIS — F3342 Major depressive disorder, recurrent, in full remission: Secondary | ICD-10-CM

## 2020-05-07 DIAGNOSIS — J4540 Moderate persistent asthma, uncomplicated: Secondary | ICD-10-CM | POA: Diagnosis not present

## 2020-05-08 ENCOUNTER — Ambulatory Visit (INDEPENDENT_AMBULATORY_CARE_PROVIDER_SITE_OTHER): Payer: Medicare Other | Admitting: *Deleted

## 2020-05-08 ENCOUNTER — Other Ambulatory Visit: Payer: Self-pay

## 2020-05-08 DIAGNOSIS — J4540 Moderate persistent asthma, uncomplicated: Secondary | ICD-10-CM | POA: Diagnosis not present

## 2020-05-18 DIAGNOSIS — J4540 Moderate persistent asthma, uncomplicated: Secondary | ICD-10-CM | POA: Diagnosis not present

## 2020-05-21 ENCOUNTER — Ambulatory Visit (INDEPENDENT_AMBULATORY_CARE_PROVIDER_SITE_OTHER): Payer: Medicare Other

## 2020-05-21 ENCOUNTER — Other Ambulatory Visit: Payer: Self-pay

## 2020-05-21 DIAGNOSIS — J4540 Moderate persistent asthma, uncomplicated: Secondary | ICD-10-CM

## 2020-05-23 DIAGNOSIS — H401132 Primary open-angle glaucoma, bilateral, moderate stage: Secondary | ICD-10-CM | POA: Diagnosis not present

## 2020-05-23 DIAGNOSIS — Z961 Presence of intraocular lens: Secondary | ICD-10-CM | POA: Diagnosis not present

## 2020-05-23 DIAGNOSIS — H53431 Sector or arcuate defects, right eye: Secondary | ICD-10-CM | POA: Diagnosis not present

## 2020-05-23 DIAGNOSIS — H353112 Nonexudative age-related macular degeneration, right eye, intermediate dry stage: Secondary | ICD-10-CM | POA: Diagnosis not present

## 2020-06-01 ENCOUNTER — Other Ambulatory Visit: Payer: Self-pay | Admitting: Internal Medicine

## 2020-06-01 DIAGNOSIS — I10 Essential (primary) hypertension: Secondary | ICD-10-CM

## 2020-06-01 DIAGNOSIS — E785 Hyperlipidemia, unspecified: Secondary | ICD-10-CM

## 2020-06-04 ENCOUNTER — Other Ambulatory Visit: Payer: Self-pay | Admitting: Allergy and Immunology

## 2020-06-04 DIAGNOSIS — J4540 Moderate persistent asthma, uncomplicated: Secondary | ICD-10-CM | POA: Diagnosis not present

## 2020-06-05 ENCOUNTER — Ambulatory Visit: Payer: Self-pay

## 2020-06-05 ENCOUNTER — Other Ambulatory Visit: Payer: Self-pay

## 2020-06-05 ENCOUNTER — Telehealth: Payer: Self-pay | Admitting: *Deleted

## 2020-06-05 ENCOUNTER — Ambulatory Visit (INDEPENDENT_AMBULATORY_CARE_PROVIDER_SITE_OTHER): Payer: Medicare Other | Admitting: Allergy and Immunology

## 2020-06-05 ENCOUNTER — Encounter: Payer: Self-pay | Admitting: Allergy and Immunology

## 2020-06-05 VITALS — BP 126/74 | HR 77 | Temp 97.4°F | Resp 18 | Ht 70.5 in | Wt 216.4 lb

## 2020-06-05 DIAGNOSIS — J849 Interstitial pulmonary disease, unspecified: Secondary | ICD-10-CM | POA: Diagnosis not present

## 2020-06-05 DIAGNOSIS — J4540 Moderate persistent asthma, uncomplicated: Secondary | ICD-10-CM | POA: Diagnosis not present

## 2020-06-05 NOTE — Patient Instructions (Addendum)
° °  1. Continue to Treat inflammation:   A. Breztri - 2 inhalations 2 times per day   B. Xolair injections and EpiPen    2. Continue to Treat reflux:   A.  Pantoprazole 40 mg in the a.m. + famotidine 20 mg in the p.m.  3.  If needed:   A. ProAir HFA 2 puffs every 4-6 hours  B. nasal saline washes  C. OTC antihistamine  D. Mucinex DM 2 tablets twice a day  E. Duoneb nebulized every 4-6 hours if needed.  F. Nasacort one spray each nostril one time per day  G. Acyclovir 200 - 4 times a day during fever blister outbreak  4. Obtain HRCT chest for ILD  5. Submit for Tezepelumab injections to replace omalizumab  6. Return to clinic in 3 months or earlier if problem

## 2020-06-05 NOTE — Telephone Encounter (Signed)
A High Resolution Chest CT has been ordered for [facility] for Interstitial Lung Disease. Will work on a pre certification/ prior authorization.

## 2020-06-05 NOTE — Progress Notes (Signed)
[facility]   Follow-up Note  Referring Provider: Janith Lima, MD Primary Provider: Janith Lima, MD Date of Office Visit: 06/05/2020  Subjective:   Edwin [name] (DOB: 05/23/1941) is a 80 y.o. male who returns to the [facility] on 06/05/2020 in re-evaluation of the following:  HPI: Salem returns to this clinic in evaluation of COPD/asthma overlap, interstitial lung disease, allergic rhinitis, history of chronic sinusitis, and a history of recurrent fever blisters and LPR.  His last visit to this clinic was 01/31/2020.  He believes that he has had some progressive issues with his lungs during this timeframe.  He has had a little bit more shortness of breath and coughing.  He has had a little bit more mucus production.  This occurs even though he remains on omalizumab injections and a triple inhaler.  When he uses a short acting bronchodilator it may help his cough minimally.  He has had no issues with his nose.  He has had no issues with reflux.  He intermittently uses acyclovir for his recurrent fever blisters which is working well.  He has received 3 Pfizer Covid vaccinations and the flu vaccine.  Allergies as of 06/05/2020      Reactions   Ramipril    REACTION: chest congestion & cough. No angioedema      Medication List    acyclovir 200 MG capsule Commonly known as: ZOVIRAX Take 200 mg by mouth 4 (four) times daily as needed (for fever blisters/cold sores.).   acyclovir 200 MG capsule Commonly known as: ZOVIRAX Take 4 times daily during fever blister outbreak.   albuterol 108 (90 Base) MCG/ACT inhaler Commonly known as: Ventolin HFA INHALE 1-2 PUFFS INTO THE LUNGS EVERY 4 HOURS AS NEEDED FOR WHEEZING OR SHORTNESS OF BREATH   allopurinol 300 MG tablet Commonly known as: ZYLOPRIM Take 1 tablet (300 mg total) by mouth daily.   atorvastatin 20 MG tablet Commonly known as: LIPITOR TAKE 1 TABLET  BY MOUTH EVERY DAY   Breztri Aerosphere 160-9-4.8 MCG/ACT Aero Generic drug: Budeson-Glycopyrrol-Formoterol Inhale 2 puffs into the lungs in the morning and at bedtime.   brimonidine 0.1 % Soln Commonly known as: ALPHAGAN P Place 1 drop into both eyes 2 (two) times daily. Both eyes   dextromethorphan-guaiFENesin 30-600 MG 12hr tablet Commonly known as: MUCINEX DM Take 1 tablet by mouth 2 (two) times daily as needed for cough.   EPINEPHrine 0.3 mg/0.3 mL Soaj injection Commonly known as: EPI-PEN Inject 0.3 mLs (0.3 mg total) into the muscle once.   famotidine 20 MG tablet Commonly known as: PEPCID TAKE 1 TABLET BY MOUTH EVERYDAY AT BEDTIME   FLUoxetine 10 MG capsule Commonly known as: PROZAC TAKE 1 CAPSULE BY MOUTH EVERY DAY   Lumigan 0.01 % Soln Generic drug: bimatoprost   mometasone 0.1 % ointment Commonly known as: ELOCON Apply topically once daily as needed   olmesartan-hydrochlorothiazide 40-12.5 MG tablet Commonly known as: BENICAR HCT TAKE 1/2 TABLET BY MOUTH EVERY DAY   omeprazole 20 MG capsule Commonly known as: PRILOSEC Take 20 mg by mouth daily.   pantoprazole 40 MG tablet Commonly known as: PROTONIX TAKE 1 TABLET BY MOUTH EVERY DAY IN THE MORNING   psyllium 58.6 % powder Commonly known as: METAMUCIL Take 1 packet by mouth daily.   VITAMIN B 12 PO Take 1 tablet by mouth daily.       Past Medical History:  Diagnosis Date  .  Asthma   . COPD (chronic obstructive pulmonary disease) (HCC)    reactive airway disease  . Glaucoma   . Gout   . Hx of colonic polyps    Dr Earlean Shawl  . Hyperlipidemia    NMR lipoprofile 2005: LDL 113(1416/825), HLD 37, TG 190.  [name] Kitchen Hypertension   . Pneumonia     Past Surgical History:  Procedure Laterality Date  . ANTERIOR CERVICAL DECOMP/DISCECTOMY FUSION N/A 03/08/2018   Procedure: ANTERIOR CERVICAL DECOMPRESSION/DISCECTOMY FUSION, INTERBODY PROSTHESIS,PLATE/SCREWS CERVICAL FOUR- CERVICAL FIVE, CERVICAL FIVE- CERVICAL  SIX, CERVICAL SIX- CERVICAL SEVEN;  Surgeon: Newman Pies, MD;  Location: [facility];  Service: Neurosurgery;  Laterality: N/A;  anterior  . CATARACT EXTRACTION     bilat, implants  . CHOLECYSTECTOMY  1995  . LUMBAR LAMINECTOMY  1982  . POLYPECTOMY  2003    neg 2006, Dr.Medoff  . tear duct surgery  1998   for excess tearing, skin graft L foot @ age 69 1/2    Review of systems negative except as noted in HPI / PMHx or noted below:  Review of Systems  Constitutional: Negative.   HENT: Negative.   Eyes: Negative.   Respiratory: Negative.   Cardiovascular: Negative.   Gastrointestinal: Negative.   Genitourinary: Negative.   Musculoskeletal: Negative.   Skin: Negative.   Neurological: Negative.   Endo/Heme/Allergies: Negative.   Psychiatric/Behavioral: Negative.      Objective:   Vitals:   06/05/20 1124  BP: 126/74  Pulse: 77  Resp: 18  Temp: (!) 97.4 F (36.3 C)  SpO2: 95%   Height: 5' 10.5" (179.1 cm)  Weight: 216 lb 6.4 oz (98.2 kg)   Physical Exam Constitutional:      Appearance: He is not diaphoretic.  HENT:     Head: Normocephalic.     Right Ear: Tympanic membrane, ear canal and external ear normal.     Left Ear: Tympanic membrane, ear canal and external ear normal.     Nose: Nose normal. No mucosal edema or rhinorrhea.     Mouth/Throat:     Mouth: Oropharynx is clear and moist and mucous membranes are normal.     Pharynx: Uvula midline. No oropharyngeal exudate.  Eyes:     Conjunctiva/sclera: Conjunctivae normal.  Neck:     Thyroid: No thyromegaly.     Trachea: Trachea normal. No tracheal tenderness or tracheal deviation.  Cardiovascular:     Rate and Rhythm: Normal rate and regular rhythm.     Heart sounds: Normal heart sounds, S1 normal and S2 normal. No murmur heard.   Pulmonary:     Effort: No respiratory distress.     Breath sounds: Normal breath sounds. No stridor. No wheezing (Bilateral inspiratory and expiratory wheezing all lung Migues) or  rales.  Musculoskeletal:        General: No edema.  Lymphadenopathy:     Head:     Right side of head: No tonsillar adenopathy.     Left side of head: No tonsillar adenopathy.     Cervical: No cervical adenopathy.  Skin:    Findings: No erythema or rash.     Nails: There is no clubbing.  Neurological:     Mental Status: He is alert.     Diagnostics:    Spirometry was performed and demonstrated an FEV1 of 1.77 at 58 % of predicted.  The patient had an Asthma Control Test with the following results: ACT Total Score: 17.    Assessment and Plan:   1. Moderate persistent  asthma, uncomplicated   2. Interstitial pulmonary disease (HCC)     1. Continue to Treat inflammation:   A. Breztri - 2 inhalations 2 times per day   B. Xolair injections and EpiPen    2. Continue to Treat reflux:   A.  Pantoprazole 40 mg in the a.m. + famotidine 20 mg in the p.m.  3.  If needed:   A. ProAir HFA 2 puffs every 4-6 hours  B. nasal saline washes  C. OTC antihistamine  D. Mucinex DM 2 tablets twice a day  E. Duoneb nebulized every 4-6 hours if needed.  F. Nasacort one spray each nostril one time per day  G. Acyclovir 200 - 4 times a day during fever blister outbreak  4. Obtain HRCT chest for ILD  5. Submit for Tezepelumab injections to replace omalizumab  6. Return to clinic in 3 months or earlier if problem    I think that Jahzier is definitely having a little more progression of his lung issue and whether this is secondary to some reversible asthmatic process or a reflection of his apparent interstitial lung disease is still an open question.  We will obtain a high-resolution chest CT scan to reevaluate his interstitial lung disease as his last CT scan was about 16 months ago.  We will change his biologic agent from omalizumab to Tezepelumab to address possible IL-13 driven mucus production.  We will get him started on that biologic agent as soon as possible.  I will contact him with the  results of his CT scan once it is available for review.  Laurette Schimke, MD Allergy / Immunology [facility]

## 2020-06-06 ENCOUNTER — Encounter: Payer: Self-pay | Admitting: Allergy and Immunology

## 2020-06-07 DIAGNOSIS — Z23 Encounter for immunization: Secondary | ICD-10-CM | POA: Diagnosis not present

## 2020-06-07 NOTE — Telephone Encounter (Signed)
Upon research it seems that there is not a PA needed for the Chest CT. Called [facility] and advised, [facility] stated that they will call the patient to get this scheduled. Called the patient and left a detailed voicemail per DPR permission advising that they will be calling.

## 2020-06-11 ENCOUNTER — Ambulatory Visit: Payer: Medicare Other

## 2020-06-13 ENCOUNTER — Telehealth: Payer: Self-pay | Admitting: *Deleted

## 2020-06-13 ENCOUNTER — Telehealth: Payer: Self-pay | Admitting: Allergy and Immunology

## 2020-06-13 MED ORDER — TRELEGY ELLIPTA 200-62.5-25 MCG/INH IN AEPB: 1.0000 | INHALATION_SPRAY | Freq: Every day | RESPIRATORY_TRACT | 5 refills | Status: DC

## 2020-06-13 NOTE — Telephone Encounter (Signed)
Called patient and advised Tezspire nto up with PAP at this time and will need to get same through patient assistance so patient can afford. He also advised his Breztri unaffordable and I will look into patient assistance for same

## 2020-06-13 NOTE — Telephone Encounter (Signed)
-----   Message from Derl Barrow, [place] sent at 06/05/2020 12:19 PM EST ----- Regarding: Tezepelumab injections Dr. Neldon Mc would like for the patient to switch from Xolair to [facility] injections. Can you please submit this to insurance please? Thank You.

## 2020-06-13 NOTE — Telephone Encounter (Signed)
Patient states new inhaler, Judithann Sauger, is over $500 and would like an alternative or something to lower the price.  Please advise.

## 2020-06-13 NOTE — Telephone Encounter (Signed)
[facility] [place] Patient was notified and verbalized on how to use the medication to replace Breztri.

## 2020-06-13 NOTE — Telephone Encounter (Signed)
Can we substitute with Trelegy 200 - 1 inhalation 1 time per day?

## 2020-06-13 NOTE — Telephone Encounter (Signed)
PA not required under patient's insurance. Spoke with pharmacy and they stated there was no need for PA... Dr. Neldon Mc do you have an alternative medication patient can use?

## 2020-06-13 NOTE — Telephone Encounter (Signed)
PA for Judithann Sauger was initiated through [email or web address] pending approval

## 2020-06-15 NOTE — Telephone Encounter (Signed)
Mailed patient enrollment for quick start program for [employer]

## 2020-06-18 DIAGNOSIS — J4540 Moderate persistent asthma, uncomplicated: Secondary | ICD-10-CM | POA: Diagnosis not present

## 2020-06-19 ENCOUNTER — Ambulatory Visit (INDEPENDENT_AMBULATORY_CARE_PROVIDER_SITE_OTHER): Payer: Medicare Other

## 2020-06-19 ENCOUNTER — Other Ambulatory Visit: Payer: Self-pay

## 2020-06-19 DIAGNOSIS — J4540 Moderate persistent asthma, uncomplicated: Secondary | ICD-10-CM | POA: Diagnosis not present

## 2020-06-22 ENCOUNTER — Ambulatory Visit
Admission: RE | Admit: 2020-06-22 | Discharge: 2020-06-22 | Disposition: A | Discharge status: Home / Self Care | Payer: Medicare Other | Source: Ambulatory Visit | Attending: Allergy and Immunology | Admitting: Allergy and Immunology

## 2020-06-22 DIAGNOSIS — I2510 Atherosclerotic heart disease of native coronary artery without angina pectoris: Secondary | ICD-10-CM | POA: Diagnosis not present

## 2020-06-22 DIAGNOSIS — J432 Centrilobular emphysema: Secondary | ICD-10-CM | POA: Diagnosis not present

## 2020-06-22 DIAGNOSIS — J8410 Pulmonary fibrosis, unspecified: Secondary | ICD-10-CM | POA: Diagnosis not present

## 2020-06-22 DIAGNOSIS — J438 Other emphysema: Secondary | ICD-10-CM | POA: Diagnosis not present

## 2020-07-03 ENCOUNTER — Other Ambulatory Visit: Payer: Self-pay

## 2020-07-03 ENCOUNTER — Ambulatory Visit (INDEPENDENT_AMBULATORY_CARE_PROVIDER_SITE_OTHER): Payer: Medicare Other

## 2020-07-03 DIAGNOSIS — J4540 Moderate persistent asthma, uncomplicated: Secondary | ICD-10-CM

## 2020-07-03 DIAGNOSIS — J4550 Severe persistent asthma, uncomplicated: Secondary | ICD-10-CM | POA: Diagnosis not present

## 2020-07-03 MED ORDER — AMBULATORY NON FORMULARY MEDICATION: 210.0000 mg | 5 refills | Status: DC

## 2020-07-03 NOTE — Progress Notes (Signed)
Immunotherapy   Patient Details  Name: Edwin [name] MRN: [id number] Date of Birth: [birth year]  07/03/2020  Edwin [name] started injections for   Tezspire 210 mg/1.91 mL QRF:[id number] EXP:01/31/2023 NDC: 49826-415-83  Frequency: every 4 weeks Epi-Pen:Epi-Pen Available  Consent signed and patient instructions given.   Edwin [name] I Edwin [name] 07/03/2020, 11:14 AM

## 2020-07-06 ENCOUNTER — Other Ambulatory Visit: Payer: Self-pay | Admitting: Allergy and Immunology

## 2020-07-30 ENCOUNTER — Other Ambulatory Visit: Payer: Self-pay | Admitting: Internal Medicine

## 2020-07-30 ENCOUNTER — Other Ambulatory Visit: Payer: Self-pay | Admitting: Allergy and Immunology

## 2020-07-30 DIAGNOSIS — E785 Hyperlipidemia, unspecified: Secondary | ICD-10-CM

## 2020-07-30 DIAGNOSIS — I10 Essential (primary) hypertension: Secondary | ICD-10-CM

## 2020-07-30 DIAGNOSIS — M1000 Idiopathic gout, unspecified site: Secondary | ICD-10-CM

## 2020-07-31 ENCOUNTER — Ambulatory Visit (INDEPENDENT_AMBULATORY_CARE_PROVIDER_SITE_OTHER): Payer: Medicare Other

## 2020-07-31 ENCOUNTER — Other Ambulatory Visit: Payer: Self-pay

## 2020-07-31 DIAGNOSIS — J4540 Moderate persistent asthma, uncomplicated: Secondary | ICD-10-CM

## 2020-07-31 DIAGNOSIS — J4550 Severe persistent asthma, uncomplicated: Secondary | ICD-10-CM | POA: Diagnosis not present

## 2020-07-31 MED ORDER — TEZSPIRE 210 MG/1.91ML ~~LOC~~ SOSY: 210.0000 mg | PREFILLED_SYRINGE | SUBCUTANEOUS | 0 refills | Status: DC

## 2020-08-03 ENCOUNTER — Telehealth: Payer: Self-pay | Admitting: Allergy and Immunology

## 2020-08-03 ENCOUNTER — Other Ambulatory Visit: Payer: Self-pay | Admitting: *Deleted

## 2020-08-03 DIAGNOSIS — Z20822 Contact with and (suspected) exposure to covid-19: Secondary | ICD-10-CM | POA: Diagnosis not present

## 2020-08-03 MED ORDER — PREDNISONE 10 MG PO TABS: ORAL_TABLET | ORAL | 0 refills | Status: DC

## 2020-08-03 MED ORDER — AZITHROMYCIN 500 MG PO TABS: ORAL_TABLET | ORAL | 0 refills | Status: DC

## 2020-08-03 NOTE — Telephone Encounter (Signed)
Spoke with [employer], he states that his cough started last night and his cough is productive (yellow/green). States no other symptoms.   Using:  Breztri 2 BID Protonix 40 QAM Famotidine 20 QHS Mucinex DM

## 2020-08-03 NOTE — Telephone Encounter (Signed)
Please inform Charod that we will send in Azithromycin 500 mg - 1 tablet 1 time a day for 3 days and prednisone 10 mg - 1 tablet 1 time a day for 10 days and he needs to get a Covid swab (rapid and if negative a PCR swab)

## 2020-08-03 NOTE — Telephone Encounter (Signed)
Jaquel informed and [facility] [address]

## 2020-08-03 NOTE — Telephone Encounter (Signed)
Patient called asking to speak to a nurse. Starting last night, patient is very congested and was told by Dr. Neldon Mc to call the office when this happens. Patient was deeply coughing while on the phone. Patient has not been taking any medications for it at this time.  Please advise.

## 2020-08-13 NOTE — Telephone Encounter (Signed)
Please make him an appointment tomorrow.

## 2020-08-13 NOTE — Telephone Encounter (Signed)
Pt states he is not feeling better and is demanding an appointment for tomorrow w/ Dr. Neldon Mc.   Please advise if he can be squeezed in.

## 2020-08-13 NOTE — Telephone Encounter (Signed)
Patient scheduled with Dr. Neldon Mc tomorrow at [facility].

## 2020-08-14 ENCOUNTER — Other Ambulatory Visit: Payer: Self-pay

## 2020-08-14 ENCOUNTER — Encounter: Payer: Self-pay | Admitting: Allergy and Immunology

## 2020-08-14 ENCOUNTER — Ambulatory Visit (INDEPENDENT_AMBULATORY_CARE_PROVIDER_SITE_OTHER): Payer: Medicare Other | Admitting: Allergy and Immunology

## 2020-08-14 VITALS — BP 118/78 | HR 98 | Temp 98.2°F | Resp 16

## 2020-08-14 DIAGNOSIS — K219 Gastro-esophageal reflux disease without esophagitis: Secondary | ICD-10-CM | POA: Diagnosis not present

## 2020-08-14 DIAGNOSIS — J449 Chronic obstructive pulmonary disease, unspecified: Secondary | ICD-10-CM | POA: Diagnosis not present

## 2020-08-14 DIAGNOSIS — J324 Chronic pansinusitis: Secondary | ICD-10-CM

## 2020-08-14 DIAGNOSIS — B001 Herpesviral vesicular dermatitis: Secondary | ICD-10-CM | POA: Diagnosis not present

## 2020-08-14 DIAGNOSIS — J3089 Other allergic rhinitis: Secondary | ICD-10-CM

## 2020-08-14 DIAGNOSIS — J849 Interstitial pulmonary disease, unspecified: Secondary | ICD-10-CM

## 2020-08-14 MED ORDER — AMOXICILLIN-POT CLAVULANATE 875-125 MG PO TABS: 1.0000 | ORAL_TABLET | Freq: Two times a day (BID) | ORAL | 0 refills | Status: AC

## 2020-08-14 MED ORDER — METHYLPREDNISOLONE ACETATE 80 MG/ML IJ SUSP
80.0000 mg | Freq: Once | INTRAMUSCULAR | Status: AC
  Administered 2020-08-14: 80 mg via INTRAMUSCULAR

## 2020-08-14 NOTE — Patient Instructions (Addendum)
   1. Continue to Treat inflammation:   A. Breztri - 2 inhalations 2 times per day   B. Tezepelumab injections every 4 weeks  C. Depomedrol 80 IM delivered in clinic today  D. Prednisone 10 mg - 3 tablets now  E. Prednisone 10 mg - 2 tablet daily for 2 weeks  2. Treat infection:   A. Augmentin 875 - 1 tablet 2 times per day for 10 days    3. Continue to Treat reflux:   A.  Pantoprazole 40 mg in the a.m. + famotidine 20 mg in the p.m.  4.  If needed:   A. ProAir HFA 2 puffs every 4-6 hours  B. nasal saline washes  C. OTC antihistamine  D. Mucinex DM 2 tablets twice a day  E. Duoneb nebulized every 4-6 hours if needed.  F. Nasacort one spray each nostril one time per day  G. Acyclovir 200 - 4 times a day during fever blister outbreak  5. Return to clinic in 2 weeks or earlier if problem

## 2020-08-14 NOTE — Progress Notes (Signed)
[facility]   Follow-up Note   Referring Provider: Janith [facility], MD Primary Provider: Janith [facility], MD Date of Office Visit: 08/14/2020  Subjective:   Edwin [name] (DOB: 1940/10/30) is a 80 y.o. male who returns to the All[facility] and [place] on 08/14/2020 in re-evaluation of the following:  HPI: Herschell returns to this clinic in evaluation of COPD/asthma overlap, interstitial lung disease, allergic rhinitis, history of chronic sinusitis, and history of recurrent fever blisters and LPR.  His last visit to this clinic was 05 June 2020.  Over the course of the past 3 weeks he has had lots of problems with his breathing.  He has unrelenting coughing and is very short of breath.  He has been having coughing to the point where he is having gagging.  He has had some nasal congestion and some yellow-green nasal discharge and some sore throat.  He has already been treated with azithromycin at 500 mg once a day for 3 days and prednisone at 10 mg once a day for 10 days.  Allergies as of 08/14/2020      Reactions   Ramipril    REACTION: chest congestion & cough. No angioedema      Medication List      acyclovir 200 MG capsule Commonly known as: ZOVIRAX Take 200 mg by mouth 4 (four) times daily as needed (for fever blisters/cold sores.).   acyclovir 200 MG capsule Commonly known as: ZOVIRAX Take 4 times daily during fever blister outbreak.   albuterol 108 (90 Base) MCG/ACT inhaler Commonly known as: Ventolin HFA INHALE 1-2 PUFFS INTO THE LUNGS EVERY 4 HOURS AS NEEDED FOR WHEEZING OR SHORTNESS OF BREATH   allopurinol 300 MG tablet Commonly known as: ZYLOPRIM Take 1 tablet (300 mg total) by mouth daily.   AMBULATORY NON FORMULARY MEDICATION Inject 210 mg into the skin every 28 (twenty-eight) days. Tezspire   atorvastatin 20 MG tablet Commonly known as: LIPITOR TAKE 1 TABLET BY MOUTH EVERY DAY   Breztri Aerosphere  160-9-4.8 MCG/ACT Aero Generic drug: Budeson-Glycopyrrol-Formoterol Inhale 2 puffs into the lungs in the morning and at bedtime.   brimonidine 0.1 % Soln Commonly known as: ALPHAGAN P Place 1 drop into both eyes 2 (two) times daily. Both eyes   dextromethorphan-guaiFENesin 30-600 MG 12hr tablet Commonly known as: MUCINEX DM Take 1 tablet by mouth 2 (two) times daily as needed for cough.   EPINEPHrine 0.3 mg/0.3 mL Soaj injection Commonly known as: EPI-PEN Inject 0.3 mLs (0.3 mg total) into the muscle once.   famotidine 20 MG tablet Commonly known as: PEPCID TAKE 1 TABLET BY MOUTH EVERYDAY AT BEDTIME   FLUoxetine 10 MG capsule Commonly known as: PROZAC TAKE 1 CAPSULE BY MOUTH EVERY DAY   Lumigan 0.01 % Soln Generic drug: bimatoprost   mometasone 0.1 % ointment Commonly known as: ELOCON Apply topically once daily as needed   olmesartan-hydrochlorothiazide 40-12.5 MG tablet Commonly known as: BENICAR HCT TAKE 1/2 TABLET BY MOUTH EVERY DAY   omeprazole 20 MG capsule Commonly known as: PRILOSEC Take 20 mg by mouth daily.   pantoprazole 40 MG tablet Commonly known as: PROTONIX TAKE 1 TABLET BY MOUTH EVERY DAY IN THE MORNING   psyllium 58.6 % powder Commonly known as: METAMUCIL Take 1 packet by mouth daily.   Tezspire 210 MG/1.91ML Sosy Generic drug: tezepelumab-ekko Inject 210 mg into the skin every 28 (twenty-eight) days.   Trelegy Ellipta 200-62.5-25 MCG/INH Aepb Generic  drug: Fluticasone-Umeclidin-Vilant Inhale 1 puff into the lungs daily.   VITAMIN B 12 PO Take 1 tablet by mouth daily.       Past Medical History:  Diagnosis Date  . Asthma   . COPD (chronic obstructive pulmonary disease) (HCC)    reactive airway disease  . Glaucoma   . Gout   . Hx of colonic polyps    Dr Earlean Shawl  . Hyperlipidemia    NMR lipoprofile 2005: LDL 113(1416/825), HLD 37, TG 190.  [name] Kitchen Hypertension   . Pneumonia     Past Surgical History:  Procedure Laterality Date  .  ANTERIOR CERVICAL DECOMP/DISCECTOMY FUSION N/A 03/08/2018   Procedure: ANTERIOR CERVICAL DECOMPRESSION/DISCECTOMY FUSION, INTERBODY PROSTHESIS,PLATE/SCREWS CERVICAL FOUR- CERVICAL FIVE, CERVICAL FIVE- CERVICAL SIX, CERVICAL SIX- CERVICAL SEVEN;  Surgeon: Newman Pies, MD;  Location: [facility];  Service: Neurosurgery;  Laterality: N/A;  anterior  . CATARACT EXTRACTION     bilat, implants  . CHOLECYSTECTOMY  1995  . LUMBAR LAMINECTOMY  1982  . POLYPECTOMY  2003    neg 2006, Dr.Medoff  . tear duct surgery  1998   for excess tearing, skin graft L foot @ age 69 1/2    Review of systems negative except as noted in HPI / PMHx or noted below:  Review of Systems  Constitutional: Negative.   HENT: Negative.   Eyes: Negative.   Respiratory: Negative.   Cardiovascular: Negative.   Gastrointestinal: Negative.   Genitourinary: Negative.   Musculoskeletal: Negative.   Skin: Negative.   Neurological: Negative.   Endo/Heme/Allergies: Negative.   Psychiatric/Behavioral: Negative.      Objective:   Vitals:   08/14/20 1618  BP: 118/78  Pulse: 98  Resp: 16  Temp: 98.2 F (36.8 C)  SpO2: 94%          Physical Exam Constitutional:      Appearance: He is not diaphoretic.  HENT:     Head: Normocephalic.     Right Ear: Tympanic membrane, ear canal and external ear normal.     Left Ear: Tympanic membrane, ear canal and external ear normal.     Nose: Nose normal. No mucosal edema or rhinorrhea.     Mouth/Throat:     Pharynx: Uvula midline. No oropharyngeal exudate.  Eyes:     Conjunctiva/sclera: Conjunctivae normal.  Neck:     Thyroid: No thyromegaly.     Trachea: Trachea normal. No tracheal tenderness or tracheal deviation.  Cardiovascular:     Rate and Rhythm: Normal rate and regular rhythm.     Heart sounds: Normal heart sounds, S1 normal and S2 normal. No murmur heard.   Pulmonary:     Effort: No respiratory distress.     Breath sounds: Normal breath sounds. No stridor. No  wheezing (Bilateral expiratory wheezes posterior lung [place]) or rales.  Lymphadenopathy:     Head:     Right side of head: No tonsillar adenopathy.     Left side of head: No tonsillar adenopathy.     Cervical: No cervical adenopathy.  Skin:    Findings: No erythema or rash.     Nails: There is no clubbing.  Neurological:     Mental Status: He is alert.     Diagnostics:    Spirometry was performed and demonstrated an FEV1 of 1.58 at 56 % of predicted.  Results of an HRCT chest obtained 22 June 2020 identified the following:  Cardiovascular: Normal heart size. No significant pericardial effusion/thickening. Three-vessel coronary atherosclerosis. Atherosclerotic nonaneurysmal thoracic aorta. Normal  caliber pulmonary arteries.  Mediastinum/Nodes: No discrete thyroid nodules. Unremarkable esophagus. No pathologically enlarged axillary, mediastinal or hilar lymph nodes, noting limited sensitivity for the detection of hilar adenopathy on this noncontrast study.  Lungs/Pleura: No pneumothorax. No pleural effusion. Mild centrilobular and paraseptal emphysema. No acute consolidative airspace disease or lung masses. Tiny dense nodules in the posterior lower lobes bilaterally are stable and considered benign. No new significant pulmonary nodules. No significant lobular air trapping or evidence of tracheobronchomalacia on the expiration sequence. Mild patchy subpleural reticulation and ground-glass opacity throughout the right greater than left lungs with associated mild traction bronchiectasis and architectural distortion. There is a basilar predominance to these findings. No frank honeycombing. No appreciable interval progression.  Assessment and Plan:   1. COPD with asthma ([place])   2. Interstitial lung disease (HCC)   3. Other allergic rhinitis   4. Chronic pansinusitis   5. LPRD (laryngopharyngeal reflux disease)   6. Herpes labialis     1. Continue to Treat  inflammation:   A. Breztri - 2 inhalations 2 times per day   B. Tezepelumab injections every 4 weeks  C. Depomedrol 80 IM delivered in clinic today  D. Prednisone 10 mg - 3 tablets now  E. Prednisone 10 mg - 2 tablet daily for 2 weeks  2. Treat infection:   A. Augmentin 875 - 1 tablet 2 times per day for 10 days    3. Continue to Treat reflux:   A.  Pantoprazole 40 mg in the a.m. + famotidine 20 mg in the p.m.  4.  If needed:   A. ProAir HFA 2 puffs every 4-6 hours  B. nasal saline washes  C. OTC antihistamine  D. Mucinex DM 2 tablets twice a day  E. Duoneb nebulized every 4-6 hours if needed.  F. Nasacort one spray each nostril one time per day  G. Acyclovir 200 - 4 times a day during fever blister outbreak  5. Return to clinic in 2 weeks or earlier if problem    I am going to assume that Jonuel has a respiratory tract infection that is giving rise to significant inflammation in his airway even in the face of utilizing a large collection of anti-inflammatory agents for his airway inflammation and treat him with a systemic steroid and broad-spectrum antibiotics and see him back in his clinic in 2 weeks to assess his response and consider further evaluation and treatment based upon his response.  Allena Katz, MD Allergy / Immunology [place]

## 2020-08-15 ENCOUNTER — Encounter: Payer: Self-pay | Admitting: Allergy and Immunology

## 2020-08-28 ENCOUNTER — Ambulatory Visit: Payer: Self-pay

## 2020-08-28 ENCOUNTER — Ambulatory Visit (INDEPENDENT_AMBULATORY_CARE_PROVIDER_SITE_OTHER): Payer: Medicare Other | Admitting: Allergy and Immunology

## 2020-08-28 ENCOUNTER — Telehealth: Payer: Self-pay | Admitting: *Deleted

## 2020-08-28 ENCOUNTER — Other Ambulatory Visit: Payer: Self-pay

## 2020-08-28 ENCOUNTER — Encounter: Payer: Self-pay | Admitting: Allergy and Immunology

## 2020-08-28 VITALS — BP 130/70 | HR 88 | Temp 98.2°F | Resp 18

## 2020-08-28 DIAGNOSIS — J849 Interstitial pulmonary disease, unspecified: Secondary | ICD-10-CM | POA: Diagnosis not present

## 2020-08-28 DIAGNOSIS — J324 Chronic pansinusitis: Secondary | ICD-10-CM

## 2020-08-28 DIAGNOSIS — J4550 Severe persistent asthma, uncomplicated: Secondary | ICD-10-CM

## 2020-08-28 DIAGNOSIS — K219 Gastro-esophageal reflux disease without esophagitis: Secondary | ICD-10-CM

## 2020-08-28 DIAGNOSIS — J3089 Other allergic rhinitis: Secondary | ICD-10-CM | POA: Diagnosis not present

## 2020-08-28 DIAGNOSIS — J449 Chronic obstructive pulmonary disease, unspecified: Secondary | ICD-10-CM

## 2020-08-28 DIAGNOSIS — J4489 Other specified chronic obstructive pulmonary disease: Secondary | ICD-10-CM

## 2020-08-28 DIAGNOSIS — J4540 Moderate persistent asthma, uncomplicated: Secondary | ICD-10-CM

## 2020-08-28 MED ORDER — PREDNISONE 10 MG PO TABS: ORAL_TABLET | ORAL | 0 refills | Status: DC

## 2020-08-28 NOTE — Telephone Encounter (Signed)
Called patient to discuss Tezspire injections at [facility] infusion since he is not eligible patient assistance with MCR and supplement but would be buy and bill if we were doing same but due to no Jcode we are not at this time.  [facility] is okay with [facility] and will send orders. Once we start buy and bill same will reach out to get him back in clinic for injections

## 2020-08-28 NOTE — Telephone Encounter (Signed)
-----   Message from Derl Barrow, [place] sent at 08/28/2020 11:47 AM EDT ----- Regarding: Tezspire Patient never received Tezspire form in the mail. He has now received 3 samples. I advised that he needs to call you. He states that he never received paperwork in the mail.

## 2020-08-28 NOTE — Progress Notes (Signed)
Immunotherapy   Patient Details  Name: VERDELL DYKMAN MRN: [id number] Date of Birth: 23-Nov-1940  08/28/2020  [place]  Patient received his 3rd dose of Tezspire in the [place]. Patient did not experience any issues.  LOT [id number] EXP 01/31/2023 Consent signed and patient instructions given.   Rashard Ryle Fernandez-Vernon 08/28/2020, 11:58 AM

## 2020-08-28 NOTE — Progress Notes (Signed)
[facility] [place]   Follow-up Note  Referring Provider: Janith [facility], MD Primary Provider: Janith [facility], MD Date of Office Visit: 08/28/2020  Subjective:   Edwin [name] (DOB: [birth year]) is a 80 y.o. male who returns to the Allergy and [place] on 08/28/2020 in re-evaluation of the following:  HPI: Edwin [name] returns to this clinic in evaluation of COPD/asthma overlap, interstitial lung disease, allergic rhinitis, history of chronic sinusitis, history of recurrent fever blisters, and LPR.  I last saw him in this clinic on 14 August 2020 at which point in time he was having a significant issue with both his upper and lower airway.  He has significantly improved since his last visit.  He is not as short of breath although he still gets a little bit short of breath if he exerts himself to any significant degree.  Most of his cough has resolved.  He is able to sleep through the night.  His head has cleared out and he does not have any yellow-green nasal discharge.  He has not been having any reflux issues.  Allergies as of 08/28/2020      Reactions   Ramipril    REACTION: chest congestion & cough. No angioedema      Medication List      acyclovir 200 MG capsule Commonly known as: ZOVIRAX Take 200 mg by mouth 4 (four) times daily as needed (for fever blisters/cold sores.).   albuterol 108 (90 Base) MCG/ACT inhaler Commonly known as: Ventolin HFA INHALE 1-2 PUFFS INTO THE LUNGS EVERY 4 HOURS AS NEEDED FOR WHEEZING OR SHORTNESS OF BREATH   allopurinol 300 MG tablet Commonly known as: ZYLOPRIM Take 1 tablet (300 mg total) by mouth daily.   AMBULATORY NON FORMULARY MEDICATION Inject 210 mg into the skin every 28 (twenty-eight) days. Tezspire   atorvastatin 20 MG tablet Commonly known as: LIPITOR TAKE 1 TABLET BY MOUTH EVERY DAY   Breztri Aerosphere 160-9-4.8 MCG/ACT Aero Generic drug: Budeson-Glycopyrrol-Formoterol Inhale 2  puffs into the lungs in the morning and at bedtime.   brimonidine 0.1 % Soln Commonly known as: ALPHAGAN P Place 1 drop into both eyes 2 (two) times daily. Both eyes   dextromethorphan-guaiFENesin 30-600 MG 12hr tablet Commonly known as: MUCINEX DM Take 1 tablet by mouth 2 (two) times daily as needed for cough.   EPINEPHrine 0.3 mg/0.3 mL Soaj injection Commonly known as: EPI-PEN Inject 0.3 mLs (0.3 mg total) into the muscle once.   famotidine 20 MG tablet Commonly known as: PEPCID TAKE 1 TABLET BY MOUTH EVERYDAY AT BEDTIME   FLUoxetine 10 MG capsule Commonly known as: PROZAC TAKE 1 CAPSULE BY MOUTH EVERY DAY   Lumigan 0.01 % Soln Generic drug: bimatoprost   mometasone 0.1 % ointment Commonly known as: ELOCON Apply topically once daily as needed   olmesartan-hydrochlorothiazide 40-12.5 MG tablet Commonly known as: BENICAR HCT TAKE 1/2 TABLET BY MOUTH EVERY DAY   omeprazole 20 MG capsule Commonly known as: PRILOSEC Take 20 mg by mouth daily.   pantoprazole 40 MG tablet Commonly known as: PROTONIX TAKE 1 TABLET BY MOUTH EVERY DAY IN THE MORNING   predniSONE 10 MG tablet Commonly known as: DELTASONE Take 1 an 1/2 tablets for 2 weeks then 1 tablet daily with 1/2 tablet every other day for 2 weeks, then 1/2 tablet dialy for 2 weeks then 1/2 tablet alternating days  for 2 weeks. Started by: Jiles Prows, MD   psyllium  58.6 % powder Commonly known as: METAMUCIL Take 1 packet by mouth daily.   Tezspire 210 MG/1.91ML Sosy Generic drug: tezepelumab-ekko Inject 210 mg into the skin every 28 (twenty-eight) days.   Trelegy Ellipta 200-62.5-25 MCG/INH Aepb Generic drug: Fluticasone-Umeclidin-Vilant Inhale 1 puff into the lungs daily.   VITAMIN B 12 PO Take 1 tablet by mouth daily.       Past Medical History:  Diagnosis Date  . Asthma   . COPD (chronic obstructive pulmonary disease) (HCC)    reactive airway disease  . Glaucoma   . Gout   . Hx of colonic polyps     Dr Earlean Shawl  . Hyperlipidemia    NMR lipoprofile 2005: LDL 113(1416/825), HLD 37, TG 190.  [name] Kitchen Hypertension   . Pneumonia     Past Surgical History:  Procedure Laterality Date  . ANTERIOR CERVICAL DECOMP/DISCECTOMY FUSION N/A 03/08/2018   Procedure: ANTERIOR CERVICAL DECOMPRESSION/DISCECTOMY FUSION, INTERBODY PROSTHESIS,PLATE/SCREWS CERVICAL FOUR- CERVICAL FIVE, CERVICAL FIVE- CERVICAL SIX, CERVICAL SIX- CERVICAL SEVEN;  Surgeon: Newman Pies, MD;  Location: [facility];  Service: Neurosurgery;  Laterality: N/A;  anterior  . CATARACT EXTRACTION     bilat, implants  . CHOLECYSTECTOMY  1995  . LUMBAR LAMINECTOMY  1982  . POLYPECTOMY  2003    neg 2006, Dr.Medoff  . tear duct surgery  1998   for excess tearing, skin graft L foot @ age 37 1/2    Review of systems negative except as noted in HPI / PMHx or noted below:  Review of Systems  Constitutional: Negative.   HENT: Negative.   Eyes: Negative.   Respiratory: Negative.   Cardiovascular: Negative.   Gastrointestinal: Negative.   Genitourinary: Negative.   Musculoskeletal: Negative.   Skin: Negative.   Neurological: Negative.   Endo/Heme/Allergies: Negative.   Psychiatric/Behavioral: Negative.      Objective:   Vitals:   08/28/20 1115  BP: 130/70  Pulse: 88  Resp: 18  Temp: 98.2 F (36.8 C)  SpO2: 94%          Physical Exam Constitutional:      Appearance: He is not diaphoretic.  HENT:     Head: Normocephalic.     Right Ear: Tympanic membrane, ear canal and external ear normal.     Left Ear: Tympanic membrane, ear canal and external ear normal.     Nose: Nose normal. No mucosal edema or rhinorrhea.     Mouth/Throat:     Pharynx: Uvula midline. No oropharyngeal exudate.  Eyes:     Conjunctiva/sclera: Conjunctivae normal.  Neck:     Thyroid: No thyromegaly.     Trachea: Trachea normal. No tracheal tenderness or tracheal deviation.  Cardiovascular:     Rate and Rhythm: Normal rate and regular rhythm.      Heart sounds: Normal heart sounds, S1 normal and S2 normal. No murmur heard.   Pulmonary:     Effort: No respiratory distress.     Breath sounds: Normal breath sounds. No stridor. No wheezing or rales.  Lymphadenopathy:     Head:     Right side of head: No tonsillar adenopathy.     Left side of head: No tonsillar adenopathy.     Cervical: No cervical adenopathy.  Skin:    Findings: No erythema or rash.     Nails: There is no clubbing.  Neurological:     Mental Status: He is alert.     Diagnostics:    Spirometry was performed and demonstrated an FEV1 of 1.99 at  66 % of predicted.   Assessment and Plan:   1. Moderate persistent asthma, uncomplicated   2. COPD with asthma ([place])   3. Interstitial lung disease (HCC)   4. Other allergic rhinitis   5. Chronic pansinusitis   6. LPRD (laryngopharyngeal reflux disease)     1. Continue to Treat inflammation:   A. Breztri - 2 inhalations 2 times per day   B. Tezepelumab injections every 4 weeks  2. Continue to Treat reflux:   A.  Pantoprazole 40 mg in the a.m. + famotidine 20 mg in the p.m.  3. Prednisone taper: prednisone 10 mg tablets - 1+1/2 daily for 2 weeks, then 1 daily for 2 weeks, then 1 every other day alternating with 1/2 every other day for 2 weeks (1,1/2,1,1/2...), then 1/2 tablet daily for 2 weeks, then 1/2 tablet every other day alternating with 0 tablets for 2 weeks (1/2, 0, 1/2, 0...)  4.  If needed:   A. ProAir HFA 2 puffs every 4-6 hours  B. nasal saline washes  C. OTC antihistamine  D. Mucinex DM 2 tablets twice a day  E. Duoneb nebulized every 4-6 hours if needed.  F. Nasacort one spray each nostril one time per day  G. Acyclovir 200 - 4 times a day during fever blister outbreak  5. Return to clinic in 12 weeks or earlier if problem    Mana is much better on his therapeutic plan and we will now begin to taper his systemic steroids as noted above.  He has been on 20 mg a day for the past 2 weeks.  He  will basically be on 3 months of systemic steroids at the time that he finishes his taper.  He will contact me should he develop significant problems in the face of the treatment noted above.  Allena Katz, MD Allergy / Immunology [place]

## 2020-08-28 NOTE — Patient Instructions (Addendum)
   1. Continue to Treat inflammation:   A. Breztri - 2 inhalations 2 times per day   B. Tezepelumab injections every 4 weeks  2. Continue to Treat reflux:   A.  Pantoprazole 40 mg in the a.m. + famotidine 20 mg in the p.m.  3. Prednisone taper: prednisone 10 mg tablets - 1+1/2 daily for 2 weeks, then 1 daily for 2 weeks, then 1 every other day alternating with 1/2 every other day for 2 weeks (1,1/2,1,1/2...), then 1/2 tablet daily for 2 weeks, then 1/2 tablet every other day alternating with 0 tablets for 2 weeks (1/2, 0, 1/2, 0...)  4.  If needed:   A. ProAir HFA 2 puffs every 4-6 hours  B. nasal saline washes  C. OTC antihistamine  D. Mucinex DM 2 tablets twice a day  E. Duoneb nebulized every 4-6 hours if needed.  F. Nasacort one spray each nostril one time per day  G. Acyclovir 200 - 4 times a day during fever blister outbreak  5. Return to clinic in 12 weeks or earlier if problem

## 2020-08-29 ENCOUNTER — Encounter: Payer: Self-pay | Admitting: Allergy and Immunology

## 2020-09-25 ENCOUNTER — Ambulatory Visit: Payer: Self-pay

## 2020-09-26 ENCOUNTER — Telehealth: Payer: Self-pay | Admitting: Allergy and Immunology

## 2020-09-26 ENCOUNTER — Ambulatory Visit: Payer: Self-pay

## 2020-09-26 DIAGNOSIS — J4550 Severe persistent asthma, uncomplicated: Secondary | ICD-10-CM | POA: Diagnosis not present

## 2020-09-26 MED ORDER — FAMOTIDINE 20 MG PO TABS: ORAL_TABLET | ORAL | 1 refills | Status: DC

## 2020-09-26 MED ORDER — TRELEGY ELLIPTA 200-62.5-25 MCG/INH IN AEPB: 1.0000 | INHALATION_SPRAY | Freq: Every day | RESPIRATORY_TRACT | 5 refills | Status: DC

## 2020-09-26 MED ORDER — ALBUTEROL SULFATE HFA 108 (90 BASE) MCG/ACT IN AERS: INHALATION_SPRAY | RESPIRATORY_TRACT | 3 refills | Status: DC

## 2020-09-26 MED ORDER — BREZTRI AEROSPHERE 160-9-4.8 MCG/ACT IN AERO: 2.0000 | INHALATION_SPRAY | Freq: Two times a day (BID) | RESPIRATORY_TRACT | 5 refills | Status: DC

## 2020-09-26 NOTE — Telephone Encounter (Signed)
Please advise on handicap renewal forms being completed and the disability that should be stated for the reasoning of the placard.  Is the reason that patient can not walk more than 200 feet without taking a rest? Albuterol has been sent in.

## 2020-09-26 NOTE — Telephone Encounter (Signed)
Patient came into the office and dropped off a renewal of disability parking placard to be filled out, placed in nurses station.   Also patient needs a refill on albuterol rescue inhaler sent to [facility] on [address].  Please advise.

## 2020-09-26 NOTE — Telephone Encounter (Signed)
Forms are in the [place] office waiting to be completed in the pending box in the nurses station.

## 2020-09-27 ENCOUNTER — Other Ambulatory Visit: Payer: Self-pay | Admitting: Internal Medicine

## 2020-09-27 DIAGNOSIS — I10 Essential (primary) hypertension: Secondary | ICD-10-CM

## 2020-10-01 ENCOUNTER — Encounter: Payer: Self-pay | Admitting: Internal Medicine

## 2020-10-01 ENCOUNTER — Ambulatory Visit: Payer: Medicare Other

## 2020-10-01 ENCOUNTER — Ambulatory Visit (INDEPENDENT_AMBULATORY_CARE_PROVIDER_SITE_OTHER): Payer: Medicare Other

## 2020-10-01 ENCOUNTER — Other Ambulatory Visit: Payer: Self-pay

## 2020-10-01 ENCOUNTER — Ambulatory Visit (INDEPENDENT_AMBULATORY_CARE_PROVIDER_SITE_OTHER): Payer: Medicare Other | Admitting: Internal Medicine

## 2020-10-01 VITALS — BP 162/88 | HR 105 | Temp 98.4°F | Resp 20 | Ht 70.0 in | Wt 208.4 lb

## 2020-10-01 DIAGNOSIS — I4510 Unspecified right bundle-branch block: Secondary | ICD-10-CM

## 2020-10-01 DIAGNOSIS — R7303 Prediabetes: Secondary | ICD-10-CM | POA: Diagnosis not present

## 2020-10-01 DIAGNOSIS — E118 Type 2 diabetes mellitus with unspecified complications: Secondary | ICD-10-CM

## 2020-10-01 DIAGNOSIS — I10 Essential (primary) hypertension: Secondary | ICD-10-CM | POA: Diagnosis not present

## 2020-10-01 DIAGNOSIS — I2510 Atherosclerotic heart disease of native coronary artery without angina pectoris: Secondary | ICD-10-CM

## 2020-10-01 DIAGNOSIS — R0602 Shortness of breath: Secondary | ICD-10-CM | POA: Insufficient documentation

## 2020-10-01 DIAGNOSIS — R059 Cough, unspecified: Secondary | ICD-10-CM | POA: Diagnosis not present

## 2020-10-01 DIAGNOSIS — Z0001 Encounter for general adult medical examination with abnormal findings: Secondary | ICD-10-CM

## 2020-10-01 DIAGNOSIS — R000 Tachycardia, unspecified: Secondary | ICD-10-CM | POA: Insufficient documentation

## 2020-10-01 DIAGNOSIS — E785 Hyperlipidemia, unspecified: Secondary | ICD-10-CM | POA: Diagnosis not present

## 2020-10-01 DIAGNOSIS — R058 Other specified cough: Secondary | ICD-10-CM

## 2020-10-01 DIAGNOSIS — M1000 Idiopathic gout, unspecified site: Secondary | ICD-10-CM | POA: Diagnosis not present

## 2020-10-01 LAB — BASIC METABOLIC PANEL
BUN: 24 mg/dL — ABNORMAL HIGH (ref 6–23)
CO2: 28 mEq/L (ref 19–32)
Calcium: 9.5 mg/dL (ref 8.4–10.5)
Chloride: 100 mEq/L (ref 96–112)
Creatinine, Ser: 1.14 mg/dL (ref 0.40–1.50)
GFR: 61.31 mL/min (ref >60.00)
Glucose, Bld: 117 mg/dL — ABNORMAL HIGH (ref 70–99)
Potassium: 4 mEq/L (ref 3.5–5.1)
Sodium: 140 mEq/L (ref 135–145)

## 2020-10-01 LAB — HEPATIC FUNCTION PANEL
ALT: 36 U/L (ref 0–53)
AST: 28 U/L (ref 0–37)
Albumin: 4.6 g/dL (ref 3.5–5.2)
Alkaline Phosphatase: 45 U/L (ref 39–117)
Bilirubin, Direct: 0.1 mg/dL (ref 0.0–0.3)
Total Bilirubin: 0.8 mg/dL (ref 0.2–1.2)
Total Protein: 7.2 g/dL (ref 6.0–8.3)

## 2020-10-01 LAB — CBC WITH DIFFERENTIAL/PLATELET
Basophils Absolute: 0 10*3/uL (ref 0.0–0.1)
Basophils Relative: 0.5 % (ref 0.0–3.0)
Eosinophils Absolute: 0 10*3/uL (ref 0.0–0.7)
Eosinophils Relative: 0.2 % (ref 0.0–5.0)
HCT: 42 % (ref 39.0–52.0)
Hemoglobin: 14.1 g/dL (ref 13.0–17.0)
Lymphocytes Relative: 11.2 % — ABNORMAL LOW (ref 12.0–46.0)
Lymphs Abs: 1 10*3/uL (ref 0.7–4.0)
MCHC: 33.5 g/dL (ref 30.0–36.0)
MCV: 97.6 fl (ref 78.0–100.0)
Monocytes Absolute: 0.4 10*3/uL (ref 0.1–1.0)
Monocytes Relative: 4.2 % (ref 3.0–12.0)
Neutro Abs: 7.6 10*3/uL (ref 1.4–7.7)
Neutrophils Relative %: 83.9 % — ABNORMAL HIGH (ref 43.0–77.0)
Platelets: 144 10*3/uL — ABNORMAL LOW (ref 150.0–400.0)
RBC: 4.31 Mil/uL (ref 4.22–5.81)
RDW: 16.1 % — ABNORMAL HIGH (ref 11.5–15.5)
WBC: 9.1 10*3/uL (ref 4.0–10.5)

## 2020-10-01 LAB — D-DIMER, QUANTITATIVE: D-Dimer, Quant: 0.49 mcg/mL FEU (ref <0.50)

## 2020-10-01 LAB — LIPID PANEL
Cholesterol: 192 mg/dL (ref 0–200)
HDL: 70.9 mg/dL (ref >39.00)
NonHDL: 121.48
Total CHOL/HDL Ratio: 3
Triglycerides: 284 mg/dL — ABNORMAL HIGH (ref 0.0–149.0)
VLDL: 56.8 mg/dL — ABNORMAL HIGH (ref 0.0–40.0)

## 2020-10-01 LAB — LDL CHOLESTEROL, DIRECT: Direct LDL: 79 mg/dL

## 2020-10-01 LAB — TSH: TSH: 1.33 u[IU]/mL (ref 0.35–4.50)

## 2020-10-01 LAB — HEMOGLOBIN A1C: Hgb A1c MFr Bld: 6.6 % — ABNORMAL HIGH (ref 4.6–6.5)

## 2020-10-01 LAB — BRAIN NATRIURETIC PEPTIDE: Pro B Natriuretic peptide (BNP): 25 pg/mL (ref 0.0–100.0)

## 2020-10-01 LAB — TROPONIN I (HIGH SENSITIVITY): High Sens Troponin I: 11 ng/L (ref 2–17)

## 2020-10-01 MED ORDER — OLMESARTAN MEDOXOMIL-HCTZ 40-12.5 MG PO TABS: 1.0000 | ORAL_TABLET | Freq: Every day | ORAL | 1 refills | Status: DC

## 2020-10-01 MED ORDER — ALLOPURINOL 300 MG PO TABS: 300.0000 mg | ORAL_TABLET | Freq: Every day | ORAL | 1 refills | Status: DC

## 2020-10-01 NOTE — Progress Notes (Signed)
Subjective:  Patient ID: Edwin [name], male    DOB: 1940-10-15  Age: 80 y.o. MRN: [id number]  CC: Hypertension, Cough, and Annual Exam  This visit occurred during the SARS-CoV-2 public health emergency.  Safety protocols were in place, including screening questions prior to the visit, additional usage of staff PPE, and extensive cleaning of exam room while observing appropriate contact time as indicated for disinfecting solutions.    HPI Edwin [name] presents for f/up -   He complains of chronic, worsening shortness of breath and dyspnea on exertion.  He also has a chronic cough.  He believes this is related to asthma.  He has been seeing his allergist recently and is currently taking a course of systemic steroids.  He denies chest pain, diaphoresis, dizziness, lightheadedness, headache, or blurred vision.  Outpatient Medications Prior to Visit  Medication Sig Dispense Refill  . acyclovir (ZOVIRAX) 200 MG capsule Take 200 mg by mouth 4 (four) times daily as needed (for fever blisters/cold sores.).    [name] Kitchen albuterol (VENTOLIN HFA) 108 (90 Base) MCG/ACT inhaler INHALE 1-2 PUFFS INTO THE LUNGS EVERY 4 HOURS AS NEEDED FOR WHEEZING OR SHORTNESS OF BREATH 18 g 3  . AMBULATORY NON FORMULARY MEDICATION Inject 210 mg into the skin every 28 (twenty-eight) days. Tezspire 1 Syringe 5  . atorvastatin (LIPITOR) 20 MG tablet TAKE 1 TABLET BY MOUTH EVERY DAY 90 tablet 0  . brimonidine (ALPHAGAN P) 0.1 % SOLN Place 1 drop into both eyes 2 (two) times daily. Both eyes    . Budeson-Glycopyrrol-Formoterol (BREZTRI AEROSPHERE) 160-9-4.8 MCG/ACT AERO Inhale 2 puffs into the lungs in the morning and at bedtime. 10.7 g 5  . Cyanocobalamin (VITAMIN B 12 PO) Take 1 tablet by mouth daily.    [name] Kitchen dextromethorphan-guaiFENesin (MUCINEX DM) 30-600 MG 12hr tablet Take 1 tablet by mouth 2 (two) times daily as needed for cough.     . EPINEPHrine 0.3 mg/0.3 mL IJ SOAJ injection Inject 0.3 mLs (0.3 mg total) into the muscle  once. (Patient taking differently: Inject 0.3 mg into the muscle daily as needed (anaphylactic allergic reactions).) 2 Device 1  . famotidine (PEPCID) 20 MG tablet TAKE 1 TABLET BY MOUTH EVERYDAY AT BEDTIME 90 tablet 1  . FLUoxetine (PROZAC) 10 MG capsule TAKE 1 CAPSULE BY MOUTH EVERY DAY 90 capsule 1  . LUMIGAN 0.01 % SOLN     . mometasone (ELOCON) 0.1 % ointment Apply topically once daily as needed 45 g 1  . pantoprazole (PROTONIX) 40 MG tablet TAKE 1 TABLET BY MOUTH EVERY DAY IN THE MORNING 90 tablet 1  . predniSONE (DELTASONE) 10 MG tablet Take 1 an 1/2 tablets for 2 weeks then 1 tablet daily with 1/2 tablet every other day for 2 weeks, then 1/2 tablet dialy for 2 weeks then 1/2 tablet alternating days  for 2 weeks. 63 tablet 0  . psyllium (METAMUCIL) 58.6 % powder Take 1 packet by mouth daily.    Eilene Ghazi (TEZSPIRE) 210 MG/1.91ML SOSY Inject 210 mg into the skin every 28 (twenty-eight) days. 1.91 mL 0  . allopurinol (ZYLOPRIM) 300 MG tablet Take 1 tablet (300 mg total) by mouth daily. 90 tablet 1  . olmesartan-hydrochlorothiazide (BENICAR HCT) 40-12.5 MG tablet TAKE 1/2 TABLET BY MOUTH EVERY DAY 45 tablet 0  . Fluticasone-Umeclidin-Vilant (TRELEGY ELLIPTA) 200-62.5-25 MCG/INH AEPB Inhale 1 puff into the lungs daily. (Patient not taking: Reported on 10/01/2020) 60 each 5  . omeprazole (PRILOSEC) 20 MG capsule Take 20 mg by mouth  daily. (Patient not taking: Reported on 10/01/2020)    . omalizumab Arvid Right) injection 375 mg      No facility-administered medications prior to visit.    ROS Review of Systems  Constitutional: Negative.  Negative for appetite change, chills, diaphoresis and fatigue.  HENT: Negative.  Negative for sore throat and trouble swallowing.   Eyes: Negative.   Respiratory: Positive for cough and shortness of breath. Negative for chest tightness, wheezing and stridor.   Cardiovascular: Negative for chest pain, palpitations and leg swelling.  Gastrointestinal:  Negative for abdominal pain, constipation, diarrhea, nausea and vomiting.  Endocrine: Negative.   Genitourinary: Negative.  Negative for difficulty urinating.  Musculoskeletal: Negative.  Negative for arthralgias and myalgias.  Skin: Negative.  Negative for color change.  Neurological: Negative.  Negative for dizziness, weakness, light-headedness, numbness and headaches.  Hematological: Negative for adenopathy. Does not bruise/bleed easily.  Psychiatric/Behavioral: Negative.     Objective:  BP (!) 162/88 (BP Location: Right Arm, Patient Position: Sitting, Cuff Size: Large)   Pulse (!) 105   Temp 98.4 F (36.9 C) (Oral)   Resp 20   Ht 5\' 10"  (1.778 m)   Wt 208 lb 6.4 oz (94.5 kg)   SpO2 95%   BMI 29.90 kg/m   BP Readings from Last 3 Encounters:  10/03/20 133/85  10/01/20 (!) 162/88  08/28/20 130/70    Wt Readings from Last 3 Encounters:  10/03/20 213 lb (96.6 kg)  10/01/20 208 lb 6.4 oz (94.5 kg)  06/05/20 216 lb 6.4 oz (98.2 kg)    Physical Exam Vitals reviewed.  Constitutional:      General: He is not in acute distress.    Appearance: He is ill-appearing. He is not toxic-appearing or diaphoretic.  HENT:     Nose: Nose normal.     Mouth/Throat:     Mouth: Mucous membranes are moist.  Eyes:     General: No scleral icterus.    Conjunctiva/sclera: Conjunctivae normal.  Cardiovascular:     Rate and Rhythm: Regular rhythm. Tachycardia present.     Heart sounds: No gallop.      Comments: EKG- Sinus tachycardia, 107 bpm RBBB is new Low voltage  Pulmonary:     Effort: Tachypnea and accessory muscle usage present.     Breath sounds: Examination of the right-lower field reveals rales. Examination of the left-lower field reveals rales. Rales present. No decreased breath sounds, wheezing or rhonchi.  Abdominal:     General: Abdomen is flat.     Palpations: There is no mass.     Tenderness: There is no abdominal tenderness. There is no guarding.  Musculoskeletal:         General: Normal range of motion.     Right lower leg: No edema.     Left lower leg: No edema.  Skin:    General: Skin is warm and dry.     Coloration: Skin is not pale.  Neurological:     General: No focal deficit present.     Mental Status: He is alert. Mental status is at baseline.  Psychiatric:        Mood and Affect: Mood normal.        Behavior: Behavior normal.     Lab Results  Component Value Date   WBC 9.1 10/01/2020   HGB 14.1 10/01/2020   HCT 42.0 10/01/2020   PLT 144.0 (L) 10/01/2020   GLUCOSE 117 (H) 10/01/2020   CHOL 192 10/01/2020   TRIG 284.0 (H) 10/01/2020  HDL 70.90 10/01/2020   LDLDIRECT 79.0 10/01/2020   LDLCALC 43 09/08/2019   ALT 36 10/01/2020   AST 28 10/01/2020   NA 140 10/01/2020   K 4.0 10/01/2020   CL 100 10/01/2020   CREATININE 1.14 10/01/2020   BUN 24 (H) 10/01/2020   CO2 28 10/01/2020   TSH 1.33 10/01/2020   PSA 0.87 05/05/2016   HGBA1C 6.6 (H) 10/01/2020    CT Chest High Resolution  Result Date: 06/22/2020 CLINICAL DATA:  COPD. Former smoker. Wheezing. Follow-up possible interstitial lung disease. EXAM: CT CHEST WITHOUT CONTRAST TECHNIQUE: Multidetector CT imaging of the chest was performed following the standard protocol without intravenous contrast. High resolution imaging of the lungs, as well as inspiratory and expiratory imaging, was performed. COMPARISON:  11/03/2018 chest CT. 02/08/2020 chest radiograph. FINDINGS: Cardiovascular: Normal heart size. No significant pericardial effusion/thickening. Three-vessel coronary atherosclerosis. Atherosclerotic nonaneurysmal thoracic aorta. Normal caliber pulmonary arteries. Mediastinum/Nodes: No discrete thyroid nodules. Unremarkable esophagus. No pathologically enlarged axillary, mediastinal or hilar lymph nodes, noting limited sensitivity for the detection of hilar adenopathy on this noncontrast study. Lungs/Pleura: No pneumothorax. No pleural effusion. Mild centrilobular and paraseptal  emphysema. No acute consolidative airspace disease or lung masses. Tiny dense nodules in the posterior lower lobes bilaterally are stable and considered benign. No new significant pulmonary nodules. No significant lobular air trapping or evidence of tracheobronchomalacia on the expiration sequence. Mild patchy subpleural reticulation and ground-glass opacity throughout the right greater than left lungs with associated mild traction bronchiectasis and architectural distortion. There is a basilar predominance to these findings. No frank honeycombing. No appreciable interval progression. Upper abdomen: Stable mild-to-moderate elevation of the right hemidiaphragm. Cholecystectomy. Musculoskeletal: No aggressive appearing focal osseous lesions. Partially visualized surgical hardware from ACDF. Mild thoracic spondylosis. IMPRESSION: 1. Spectrum of findings suggestive of a mild basilar predominant fibrotic interstitial lung disease without frank honeycombing, without appreciable interval progression. Findings are indeterminate for UIP per consensus guidelines: Diagnosis of Idiopathic Pulmonary Fibrosis: An Official ATS/ERS/JRS/ALAT Clinical Practice Guideline. [place], Iss 5, [id number], Jan 31 2017. 2. Three-vessel coronary atherosclerosis. 3. Chronic mild-to-moderate elevation of the right hemidiaphragm. 4. Aortic Atherosclerosis (ICD10-I70.0) and Emphysema (ICD10-J43.9). Electronically Signed   By: Ilona Sorrel M.D.   On: 06/22/2020 16:14   DG Chest 2 View  Result Date: 10/01/2020 CLINICAL DATA:  cough and sob EXAM: CHEST - 2 VIEW COMPARISON:  Chest radiograph 02/08/2020, chest CT 06/22/2020 FINDINGS: Unchanged cardiomediastinal silhouette. Unchanged scarring/fibrotic change, right greater than left. No new focal airspace disease. No pleural effusion or pneumothorax. Thoracic spondylosis. No acute osseous abnormality. Partially visualized cervical spine fusion hardware IMPRESSION: Chronic  interstitial parenchymal changes. No new focal airspace disease. Electronically Signed   By: Maurine Simmering   On: 10/01/2020 14:43    Assessment & Plan:   Mihir was seen today for hypertension, cough and annual exam.  Diagnoses and all orders for this visit:  Essential hypertension- His blood pressure is not adequately well controlled.  I recommended that he be more compliant with the ARB and thiazide diuretic. -     CBC with Differential/Platelet; Future -     Basic metabolic panel; Future -     Hepatic function panel; Future -     EKG 12-Lead -     TSH; Future -     TSH -     Hepatic function panel -     Basic metabolic panel -     CBC with Differential/Platelet -  olmesartan-hydrochlorothiazide (BENICAR HCT) 40-12.5 MG tablet; Take 1 tablet by mouth daily.  Prediabetes-his A1c is up to 6.6%.  Medical therapy is not yet indicated. -     Basic metabolic panel; Future -     Hemoglobin A1c; Future -     Hemoglobin A1c -     Basic metabolic panel  Hyperlipidemia with target LDL less than 130- LDL goal achieved. Doing well on the statin -     Lipid panel; Future -     Hepatic function panel; Future -     Hepatic function panel -     Lipid panel  SOB (shortness of breath) on exertion- This is most likely related to the asthma.  Labs are negative for secondary causes.  He has new right bundle blanch block so I recommended that he have a cardiac work-up. -     D-dimer, quantitative; Future -     Troponin I (High Sensitivity); Future -     Brain natriuretic peptide; Future -     DG Chest 2 View; Future -     EKG 12-Lead -     Brain natriuretic peptide -     Troponin I (High Sensitivity) -     D-dimer, quantitative  Chronic tachycardia -     D-dimer, quantitative; Future -     Troponin I (High Sensitivity); Future -     Brain natriuretic peptide; Future -     EKG 12-Lead -     Ambulatory referral to Cardiology -     TSH; Future -     TSH -     Brain natriuretic peptide -      Troponin I (High Sensitivity) -     D-dimer, quantitative  Cough with sputum- His chest x-ray is negative for mass or infiltrate. -     DG Chest 2 View; Future  New onset right bundle branch block (RBBB) -     Ambulatory referral to Cardiology  Idiopathic gout, unspecified chronicity, unspecified site -     allopurinol (ZYLOPRIM) 300 MG tablet; Take 1 tablet (300 mg total) by mouth daily.  Other orders -     LDL cholesterol, direct   I have discontinued Treasure W. Trzcinski's olmesartan-hydrochlorothiazide and Trelegy Ellipta. I am also having him start on olmesartan-hydrochlorothiazide. Additionally, I am having him maintain his brimonidine, psyllium, dextromethorphan-guaiFENesin, EPINEPHrine, acyclovir, Lumigan, Cyanocobalamin (VITAMIN B 12 PO), mometasone, FLUoxetine, AMBULATORY NON FORMULARY MEDICATION, pantoprazole, atorvastatin, Tezspire, predniSONE, albuterol, famotidine, Breztri Aerosphere, and allopurinol. We will stop administering omalizumab.  Meds ordered this encounter  Medications  . allopurinol (ZYLOPRIM) 300 MG tablet    Sig: Take 1 tablet (300 mg total) by mouth daily.    Dispense:  90 tablet    Refill:  1  . olmesartan-hydrochlorothiazide (BENICAR HCT) 40-12.5 MG tablet    Sig: Take 1 tablet by mouth daily.    Dispense:  90 tablet    Refill:  1     Follow-up: Return in about 3 months (around 01/01/2021).  Scarlette Calico, MD

## 2020-10-01 NOTE — Patient Instructions (Signed)

## 2020-10-02 NOTE — Telephone Encounter (Signed)
Forms have been completed and sighed by Dr.Kozlow. Patient has been aware of the completion of the forms and has verbalized that he would retreive the papers after lunch time tomorrow from the [employer] office. Papers are waiting in the front office for patients retrieval.

## 2020-10-03 ENCOUNTER — Encounter: Payer: Self-pay | Admitting: Cardiology

## 2020-10-03 ENCOUNTER — Ambulatory Visit: Payer: Medicare Other | Admitting: Cardiology

## 2020-10-03 ENCOUNTER — Other Ambulatory Visit: Payer: Self-pay

## 2020-10-03 VITALS — BP 133/85 | HR 111 | Temp 98.0°F | Resp 17 | Ht 70.0 in | Wt 213.0 lb

## 2020-10-03 DIAGNOSIS — I2510 Atherosclerotic heart disease of native coronary artery without angina pectoris: Secondary | ICD-10-CM

## 2020-10-03 DIAGNOSIS — I10 Essential (primary) hypertension: Secondary | ICD-10-CM | POA: Diagnosis not present

## 2020-10-03 DIAGNOSIS — E7800 Pure hypercholesterolemia, unspecified: Secondary | ICD-10-CM

## 2020-10-03 DIAGNOSIS — R000 Tachycardia, unspecified: Secondary | ICD-10-CM | POA: Diagnosis not present

## 2020-10-03 DIAGNOSIS — J432 Centrilobular emphysema: Secondary | ICD-10-CM

## 2020-10-03 DIAGNOSIS — R0602 Shortness of breath: Secondary | ICD-10-CM | POA: Diagnosis not present

## 2020-10-03 DIAGNOSIS — I4510 Unspecified right bundle-branch block: Secondary | ICD-10-CM | POA: Diagnosis not present

## 2020-10-03 MED ORDER — VERAPAMIL HCL ER 120 MG PO TBCR: 180.0000 mg | EXTENDED_RELEASE_TABLET | ORAL | 2 refills | Status: DC

## 2020-10-03 MED ORDER — ASPIRIN 81 MG PO CHEW
81.0000 mg | CHEWABLE_TABLET | Freq: Every day | ORAL | Status: AC
Start: 2020-10-03 — End: ?

## 2020-10-03 NOTE — Progress Notes (Signed)
Primary Physician/Referring:  Janith Lima, MD  Patient ID: Edwin [name], male    DOB: 23-Jul-1940, 80 y.o.   MRN: [id number]  Chief Complaint  Patient presents with  . chronic tachycardia  . rbbb    Ref by Dr Scarlette Calico  . New Patient (Initial Visit)  . RBBB  . Shortness of Breath   HPI:    Edwin [name]  is a 80 y.o. Caucasian male patient with prior history of greater than 60-70-pack-year history of smoking, quit in 1985, mild pulmonary fibrosis, chronic dyspnea, hyperglycemia, hypertension and hyperlipidemia referred to me for evaluation of abnormal EKG and dyspnea on exertion and coronary atherosclerosis noted on the CT scan.  He has noticed gradually worsening dyspnea.  States that about a month ago while he was moving objects from his car, he thought that he was started to make it as with exertional activity he developed severe dyspnea that needed about 20 to 30 minutes of resting.  Did not have any associated chest pain.  Past Medical History:  Diagnosis Date  . Asthma   . COPD (chronic obstructive pulmonary disease) (HCC)    reactive airway disease  . Glaucoma   . Gout   . Hx of colonic polyps    Dr Earlean Shawl  . Hyperlipidemia    NMR lipoprofile 2005: LDL 113(1416/825), HLD 37, TG 190.  [name] Kitchen Hypertension   . Pneumonia    Past Surgical History:  Procedure Laterality Date  . ANTERIOR CERVICAL DECOMP/DISCECTOMY FUSION N/A 03/08/2018   Procedure: ANTERIOR CERVICAL DECOMPRESSION/DISCECTOMY FUSION, INTERBODY PROSTHESIS,PLATE/SCREWS CERVICAL FOUR- CERVICAL FIVE, CERVICAL FIVE- CERVICAL SIX, CERVICAL SIX- CERVICAL SEVEN;  Surgeon: Newman Pies, MD;  Location: [place];  Service: Neurosurgery;  Laterality: N/A;  anterior  . CATARACT EXTRACTION     bilat, implants  . CHOLECYSTECTOMY  1995  . LUMBAR LAMINECTOMY  1982  . POLYPECTOMY  2003    neg 2006, Dr.Medoff  . tear duct surgery  1998   for excess tearing, skin graft L foot @ age 68 1/2   Family History  Problem  Relation Age of Onset  . Heart attack Father 34       due to asthma flare  . Asthma Father   . Emphysema Father   . Hypertension Mother   . Cancer Mother         ? primary  . Asthma Son        x2  . Allergic rhinitis Neg Hx   . Angioedema Neg Hx   . Atopy Neg Hx   . Eczema Neg Hx   . Immunodeficiency Neg Hx   . Urticaria Neg Hx     Social History   Tobacco Use  . Smoking status: Former Smoker    Packs/day: 2.00    Years: 25.00    Pack years: 50.00    Types: Cigarettes    Quit date: 06/03/1983    Years since quitting: 37.3  . Smokeless tobacco: Never Used  . Tobacco comment: up to 2 ppd  Substance Use Topics  . Alcohol use: No   Marital Status: Married  ROS  Review of Systems  Constitutional: Negative.  Cardiovascular: Positive for dyspnea on exertion. Negative for chest pain and leg swelling.  Endocrine: Negative.   Skin: Negative.   Musculoskeletal: Positive for arthritis.  Gastrointestinal: Negative for melena.  Genitourinary: Negative.   All other systems reviewed and are negative.  Objective  Blood pressure 133/85, pulse (!) 111, temperature 98 F (36.7 C),  resp. rate 17, height 5\' 10"  (1.778 m), weight 213 lb (96.6 kg), SpO2 94 %. Body mass index is 30.56 kg/m.  Vitals with BMI 10/03/2020 10/01/2020 08/28/2020  Height 5\' 10"  5\' 10"  -  Weight 213 lbs 208 lbs 6 oz -  BMI 37.62 83.1 -  Systolic 517 616 073  Diastolic 85 88 70  Pulse 710 105 88     Physical Exam  Constitutional: No distress.  Eyes: Conjunctivae are normal.  Neck: No JVD present.  Cardiovascular: Regular rhythm, normal heart sounds, intact distal pulses and normal pulses. Exam reveals no gallop.  No murmur heard. Pulmonary/Chest: Effort normal. He has bibasilar rales. He exhibits no tenderness.  Abdominal: Soft. Bowel sounds are normal.  Musculoskeletal:        General: No edema. Normal range of motion.     Cervical back: Neck supple.  Neurological: He is alert and oriented to person,  place, and time.  Skin: Skin is warm.     Laboratory examination:   Recent Labs    10/01/20 1406  NA 140  K 4.0  CL 100  CO2 28  GLUCOSE 117*  BUN 24*  CREATININE 1.14  CALCIUM 9.5   estimated creatinine clearance is 61.2 mL/min (by C-G formula based on SCr of 1.14 mg/dL).  CMP Latest Ref Rng & Units 10/01/2020 09/08/2019 08/18/2018  Glucose 70 - 99 mg/dL 117(H) 138(H) 125(H)  BUN 6 - 23 mg/dL 24(H) 22 23  Creatinine 0.40 - 1.50 mg/dL 1.14 1.17 1.15  Sodium 135 - 145 mEq/L 140 138 138  Potassium 3.5 - 5.1 mEq/L 4.0 3.9 3.9  Chloride 96 - 112 mEq/L 100 101 98  CO2 19 - 32 mEq/L 28 27 32  Calcium 8.4 - 10.5 mg/dL 9.5 9.5 9.4  Total Protein 6.0 - 8.3 g/dL 7.2 7.1 7.1  Total Bilirubin 0.2 - 1.2 mg/dL 0.8 0.6 0.7  Alkaline Phos 39 - 117 U/L 45 52 61  AST 0 - 37 U/L 28 34 19  ALT 0 - 53 U/L 36 25 21   CBC Latest Ref Rng & Units 10/01/2020 09/08/2019 08/18/2018  WBC 4.0 - 10.5 K/uL 9.1 7.0 10.8(H)  Hemoglobin 13.0 - 17.0 g/dL 14.1 14.8 15.0  Hematocrit 39.0 - 52.0 % 42.0 43.4 44.7  Platelets 150.0 - 400.0 K/uL 144.0(L) 156.0 218.0    Lipid Panel Recent Labs    10/01/20 1406  CHOL 192  TRIG 284.0*  VLDL 56.8*  HDL 70.90  CHOLHDL 3  LDLDIRECT 79.0   Lipid Panel     Component Value Date/Time   CHOL 192 10/01/2020 1406   TRIG 284.0 (H) 10/01/2020 1406   HDL 70.90 10/01/2020 1406   CHOLHDL 3 10/01/2020 1406   VLDL 56.8 (H) 10/01/2020 1406   LDLCALC 43 09/08/2019 0940   LDLDIRECT 79.0 10/01/2020 1406     HEMOGLOBIN A1C Lab Results  Component Value Date   HGBA1C 6.6 (H) 10/01/2020   TSH Recent Labs    10/01/20 1406  TSH 1.33    Medications and allergies   Allergies  Allergen Reactions  . Ramipril     REACTION: chest congestion & cough. No angioedema     Current Outpatient Medications on File Prior to Visit  Medication Sig Dispense Refill  . acyclovir (ZOVIRAX) 200 MG capsule Take 200 mg by mouth 4 (four) times daily as needed (for fever blisters/cold  sores.).    [name] Kitchen albuterol (VENTOLIN HFA) 108 (90 Base) MCG/ACT inhaler INHALE 1-2 PUFFS INTO THE LUNGS EVERY  4 HOURS AS NEEDED FOR WHEEZING OR SHORTNESS OF BREATH 18 g 3  . allopurinol (ZYLOPRIM) 300 MG tablet Take 1 tablet (300 mg total) by mouth daily. 90 tablet 1  . AMBULATORY NON FORMULARY MEDICATION Inject 210 mg into the skin every 28 (twenty-eight) days. Tezspire 1 Syringe 5  . atorvastatin (LIPITOR) 20 MG tablet TAKE 1 TABLET BY MOUTH EVERY DAY 90 tablet 0  . brimonidine (ALPHAGAN P) 0.1 % SOLN Place 1 drop into both eyes 2 (two) times daily. Both eyes    . Budeson-Glycopyrrol-Formoterol (BREZTRI AEROSPHERE) 160-9-4.8 MCG/ACT AERO Inhale 2 puffs into the lungs in the morning and at bedtime. 10.7 g 5  . Cyanocobalamin (VITAMIN B 12 PO) Take 1 tablet by mouth daily.    [name] Kitchen dextromethorphan-guaiFENesin (MUCINEX DM) 30-600 MG 12hr tablet Take 1 tablet by mouth 2 (two) times daily as needed for cough.     . EPINEPHrine 0.3 mg/0.3 mL IJ SOAJ injection Inject 0.3 mLs (0.3 mg total) into the muscle once. (Patient taking differently: Inject 0.3 mg into the muscle daily as needed (anaphylactic allergic reactions).) 2 Device 1  . famotidine (PEPCID) 20 MG tablet TAKE 1 TABLET BY MOUTH EVERYDAY AT BEDTIME 90 tablet 1  . FLUoxetine (PROZAC) 10 MG capsule TAKE 1 CAPSULE BY MOUTH EVERY DAY 90 capsule 1  . LUMIGAN 0.01 % SOLN     . mometasone (ELOCON) 0.1 % ointment Apply topically once daily as needed 45 g 1  . olmesartan-hydrochlorothiazide (BENICAR HCT) 40-12.5 MG tablet Take 1 tablet by mouth daily. 90 tablet 1  . pantoprazole (PROTONIX) 40 MG tablet TAKE 1 TABLET BY MOUTH EVERY DAY IN THE MORNING 90 tablet 1  . predniSONE (DELTASONE) 10 MG tablet Take 1 an 1/2 tablets for 2 weeks then 1 tablet daily with 1/2 tablet every other day for 2 weeks, then 1/2 tablet dialy for 2 weeks then 1/2 tablet alternating days  for 2 weeks. 63 tablet 0  . psyllium (METAMUCIL) 58.6 % powder Take 1 packet by mouth daily.     Eilene Ghazi (TEZSPIRE) 210 MG/1.91ML SOSY Inject 210 mg into the skin every 28 (twenty-eight) days. 1.91 mL 0   No current facility-administered medications on file prior to visit.     Radiology:   CT of the abdomen 05/29/2017:  1. Sigmoid diverticulosis with perhaps mild sigmoid wall thickening. Mild or resolving diverticulitis or colitis is possible, but there is no mesenteric inflammation. See also #2. 2. No other acute or inflammatory process in the abdomen or pelvis, however, patchy opacity in the left lower lobe could reflect Bronchopneumonia. No left pleural effusion. Query cough. 3. Aortic Atherosclerosis (ICD10-I70.0). Fatty liver disease. Left nephrolithiasis. Lumbar facet and right hip degeneration.  Chest x-ray 10/01/2020: Unchanged cardiomediastinal silhouette. Unchanged scarring/fibrotic change, right greater than left. No new focal airspace disease. No pleural effusion or pneumothorax. Thoracic spondylosis. No acute osseous abnormality. Partially visualized cervical spine fusion hardware  High resolution CT scan 06/22/2020: 1. Spectrum of findings suggestive of a mild basilar predominant fibrotic interstitial lung disease without frank honeycombing, without appreciable interval progression. Findings are indeterminate for UIP per consensus guidelines: Diagnosis of Idiopathic PulmonaryFibrosis:  2. Three-vessel coronary atherosclerosis. 3. Chronic mild-to-moderate elevation of the right hemidiaphragm. 4. Aortic Atherosclerosis  Cardiac Studies:   Echocardiogram 01/08/2016: - Left ventricle: The cavity size was normal. Wall thickness was   increased in a pattern of mild LVH. Systolic function was normal.   The estimated ejection fraction was in the range of 60% to  65%.   Wall motion was normal; there were no regional wall motion   abnormalities. Doppler parameters are consistent with abnormal   left ventricular relaxation (grade 1 diastolic dysfunction). The    E/e&' ratio is between 8-15, suggesting indeterminate LV filling   pressure. - Left atrium: The atrium was normal in size. - Tricuspid valve: There was trivial regurgitation. - Pulmonary arteries: PA peak pressure: 38 mm Hg (S). - Inferior vena cava: The vessel was normal in size. The   respirophasic diameter changes were in the normal range (>= 50%),   consistent with normal central venous pressure.  EKG:     EKG 10/03/2020: Sinus tachycardia at rate of 109 bpm. Right bundle branch block.  Low-voltage complexes.  Pulmonary disease pattern.   Assessment     ICD-10-CM   1. Sinus tachycardia  R00.0 verapamil (CALAN-SR) 120 MG CR tablet  2. Coronary artery calcification seen on CAT scan  I25.10 PCV MYOCARDIAL PERFUSION WITH LEXISCAN    aspirin (ASPIRIN CHILDRENS) 81 MG chewable tablet  3. SOB (shortness of breath)  R06.02 EKG 12-Lead    PCV ECHOCARDIOGRAM COMPLETE    PCV MYOCARDIAL PERFUSION WITH LEXISCAN  4. Centrilobular emphysema ([place])  J43.2   5. Essential hypertension  I10 verapamil (CALAN-SR) 120 MG CR tablet  6. Hypercholesteremia  E78.00      Medications Discontinued During This Encounter  Medication Reason  . omeprazole (PRILOSEC) 20 MG capsule Error    Meds ordered this encounter  Medications  . verapamil (CALAN-SR) 120 MG CR tablet    Sig: Take 1.5 tablets (180 mg total) by mouth every morning.    Dispense:  30 tablet    Refill:  2  . aspirin (ASPIRIN CHILDRENS) 81 MG chewable tablet    Sig: Chew 1 tablet (81 mg total) by mouth daily.   Orders Placed This Encounter  Procedures  . PCV MYOCARDIAL PERFUSION WITH LEXISCAN    Standing Status:   Future    Standing Expiration Date:   12/03/2020  . EKG 12-Lead  . PCV ECHOCARDIOGRAM COMPLETE    Standing Status:   Future    Standing Expiration Date:   10/03/2021   Recommendations:   Edwin [name] is a 80 y.o. Caucasian male patient with prior history of greater than 60-70-pack-year history of smoking, quit in 1985,  mild pulmonary fibrosis, chronic dyspnea, hyperglycemia, hypertension and hyperlipidemia referred to me for evaluation of abnormal EKG and dyspnea on exertion and coronary atherosclerosis noted on the CT scan.  His symptoms of dyspnea are multifactorial including presence of COPD and emphysema, pulmonary fibrosis, chronically elevated right hemidiaphragm, mild overweight/obesity, deconditioning and age.  However in view of severe triple-vessel coronary calcification noted on CT scan, marked dyspnea with exertional activity that has been gradually progressing, he needs cardiac restratification. Schedule for a Lexiscan Nuclear stress test to evaluate for myocardial ischemia. Patient unable to do treadmill stress testing due to dyspnea. Will schedule for an echocardiogram.   In view of underlying sinus tachycardia, hypertension, I will start him on verapamil 100 mg daily.  Would not state losing high-dose negative chronotropic agents in view of advanced age and potential for conduction system disease as he has new onset right bundle branch block.  With regard to hyperlipidemia, he has markedly elevated triglycerides, on appropriate moderate dose high intensity statins, he has poor eating habits and we discussed making lifestyle changes and the importance of lifestyle changes on coronary calcification, coronary disease and risk of future CV events.  He appears to be motivated.  Otherwise blood pressure is well controlled, no clinical signs of heart failure, I will see him back after the test and make further recommendation. I spent 60 minutes in the evaluation of his external labs, investigations and also regarding multiple etiologies for his dyspnea and tachycardia and mildly abnormal EKG.    Adrian Prows, MD, FACC 10/03/2020, 10:09 PM Office: [phone]

## 2020-10-04 DIAGNOSIS — E118 Type 2 diabetes mellitus with unspecified complications: Secondary | ICD-10-CM | POA: Insufficient documentation

## 2020-10-04 DIAGNOSIS — Z0001 Encounter for general adult medical examination with abnormal findings: Secondary | ICD-10-CM | POA: Insufficient documentation

## 2020-10-05 ENCOUNTER — Ambulatory Visit: Payer: Medicare Other

## 2020-10-07 DIAGNOSIS — J441 Chronic obstructive pulmonary disease with (acute) exacerbation: Secondary | ICD-10-CM | POA: Diagnosis not present

## 2020-10-07 DIAGNOSIS — R059 Cough, unspecified: Secondary | ICD-10-CM | POA: Diagnosis not present

## 2020-10-09 ENCOUNTER — Encounter: Payer: Self-pay | Admitting: Allergy and Immunology

## 2020-10-09 ENCOUNTER — Ambulatory Visit (INDEPENDENT_AMBULATORY_CARE_PROVIDER_SITE_OTHER): Payer: Medicare Other | Admitting: Allergy and Immunology

## 2020-10-09 ENCOUNTER — Telehealth: Payer: Self-pay

## 2020-10-09 ENCOUNTER — Other Ambulatory Visit: Payer: Self-pay

## 2020-10-09 VITALS — BP 102/60 | HR 94 | Resp 14 | Ht 70.5 in | Wt 212.4 lb

## 2020-10-09 DIAGNOSIS — J449 Chronic obstructive pulmonary disease, unspecified: Secondary | ICD-10-CM

## 2020-10-09 DIAGNOSIS — K219 Gastro-esophageal reflux disease without esophagitis: Secondary | ICD-10-CM

## 2020-10-09 DIAGNOSIS — I2510 Atherosclerotic heart disease of native coronary artery without angina pectoris: Secondary | ICD-10-CM | POA: Diagnosis not present

## 2020-10-09 DIAGNOSIS — J849 Interstitial pulmonary disease, unspecified: Secondary | ICD-10-CM

## 2020-10-09 DIAGNOSIS — B001 Herpesviral vesicular dermatitis: Secondary | ICD-10-CM | POA: Diagnosis not present

## 2020-10-09 DIAGNOSIS — J324 Chronic pansinusitis: Secondary | ICD-10-CM

## 2020-10-09 DIAGNOSIS — J3089 Other allergic rhinitis: Secondary | ICD-10-CM

## 2020-10-09 MED ORDER — BREZTRI AEROSPHERE 160-9-4.8 MCG/ACT IN AERO
2.0000 | INHALATION_SPRAY | Freq: Two times a day (BID) | RESPIRATORY_TRACT | 5 refills | Status: DC
Start: 2020-10-09 — End: 2021-01-01

## 2020-10-09 MED ORDER — PANTOPRAZOLE SODIUM 40 MG PO TBEC: DELAYED_RELEASE_TABLET | ORAL | 1 refills | Status: DC

## 2020-10-09 MED ORDER — ALBUTEROL SULFATE HFA 108 (90 BASE) MCG/ACT IN AERS: INHALATION_SPRAY | RESPIRATORY_TRACT | 3 refills | Status: DC

## 2020-10-09 MED ORDER — MOMETASONE FUROATE 0.1 % EX OINT: TOPICAL_OINTMENT | CUTANEOUS | 1 refills | Status: DC

## 2020-10-09 NOTE — Progress Notes (Signed)
[place] [facility]   Follow-up Note  Referring Provider: Janith Lima, MD Primary Provider: Janith Lima, MD Date of Office Visit: 10/09/2020  Subjective:   Edwin [name] (DOB: [birth year]) is a 80 y.o. male who returns to the [facility] on 10/09/2020 in re-evaluation of the following:  HPI: Dacoda returns to this clinic in evaluation of COPD/asthma overlap, ILD, allergic rhinitis, history of chronic sinusitis, history of recurrent fever blisters, and LPR.  Her last visit to this clinic was 28 August 2020.  In the face of utilizing a large amount of medications directed against respiratory tract inflammation including anti-TSLP antibody and a very slow taper of systemic steroids he continues to have problems with shortness of breath especially when attempting to exert himself to any degree.  He did visit with cardiology recently and is scheduled to have an echocardiogram to assess his heart function.  Apparently he developed an episode of vomiting and diarrhea 5 days ago and felt really bad for the weekend but fortunely that appears to have resolved.  He was tested for COVID which was negative.  He has been given a Z-Pak starting 2 days ago which has helped some of his green sputum production that seems to develop around the same point in time or a little bit preceding his vomiting and diarrhea episode.  He still continues to have these unrelenting coughing spells that last about 5 minutes about 4 times per day.  His reflux has been under very good control.  Allergies as of 10/09/2020      Reactions   Ramipril    REACTION: chest congestion & cough. No angioedema      Medication List       Accurate as of Oct 09, 2020  9:35 AM. If you have any questions, ask your nurse or doctor.        acyclovir 200 MG capsule Commonly known as: ZOVIRAX Take 200 mg by mouth 4 (four) times daily as needed (for fever blisters/cold  sores.).   albuterol 108 (90 Base) MCG/ACT inhaler Commonly known as: Ventolin HFA INHALE 1-2 PUFFS INTO THE LUNGS EVERY 4 HOURS AS NEEDED FOR WHEEZING OR SHORTNESS OF BREATH   allopurinol 300 MG tablet Commonly known as: ZYLOPRIM Take 1 tablet (300 mg total) by mouth daily.   AMBULATORY NON FORMULARY MEDICATION Inject 210 mg into the skin every 28 (twenty-eight) days. Tezspire   aspirin 81 MG chewable tablet Commonly known as: Aspirin Childrens Chew 1 tablet (81 mg total) by mouth daily.   atorvastatin 20 MG tablet Commonly known as: LIPITOR TAKE 1 TABLET BY MOUTH EVERY DAY   Breztri Aerosphere 160-9-4.8 MCG/ACT Aero Generic drug: Budeson-Glycopyrrol-Formoterol Inhale 2 puffs into the lungs in the morning and at bedtime.   brimonidine 0.1 % Soln Commonly known as: ALPHAGAN P Place 1 drop into both eyes 2 (two) times daily. Both eyes   dextromethorphan-guaiFENesin 30-600 MG 12hr tablet Commonly known as: MUCINEX DM Take 1 tablet by mouth 2 (two) times daily as needed for cough.   EPINEPHrine 0.3 mg/0.3 mL Soaj injection Commonly known as: EPI-PEN Inject 0.3 mLs (0.3 mg total) into the muscle once. What changed:   when to take this  reasons to take this   famotidine 20 MG tablet Commonly known as: PEPCID TAKE 1 TABLET BY MOUTH EVERYDAY AT BEDTIME   FLUoxetine 10 MG capsule Commonly known as: PROZAC TAKE 1 CAPSULE BY MOUTH EVERY DAY  Lumigan 0.01 % Soln Generic drug: bimatoprost   mometasone 0.1 % ointment Commonly known as: ELOCON Apply topically once daily as needed   olmesartan-hydrochlorothiazide 40-12.5 MG tablet Commonly known as: Benicar HCT Take 1 tablet by mouth daily.   pantoprazole 40 MG tablet Commonly known as: PROTONIX TAKE 1 TABLET BY MOUTH EVERY DAY IN THE MORNING   predniSONE 10 MG tablet Commonly known as: DELTASONE Take 1 an 1/2 tablets for 2 weeks then 1 tablet daily with 1/2 tablet every other day for 2 weeks, then 1/2 tablet  dialy for 2 weeks then 1/2 tablet alternating days  for 2 weeks.   psyllium 58.6 % powder Commonly known as: METAMUCIL Take 1 packet by mouth daily.   Tezspire 210 MG/1.91ML Sosy Generic drug: tezepelumab-ekko Inject 210 mg into the skin every 28 (twenty-eight) days.   verapamil 120 MG CR tablet Commonly known as: CALAN-SR Take 1.5 tablets (180 mg total) by mouth every morning.   VITAMIN B 12 PO Take 1 tablet by mouth daily.       Past Medical History:  Diagnosis Date  . Asthma   . COPD (chronic obstructive pulmonary disease) (HCC)    reactive airway disease  . Glaucoma   . Gout   . Hx of colonic polyps    Dr Earlean Shawl  . Hyperlipidemia    NMR lipoprofile 2005: LDL 113(1416/825), HLD 37, TG 190.  [name] Kitchen Hypertension   . Pneumonia     Past Surgical History:  Procedure Laterality Date  . ANTERIOR CERVICAL DECOMP/DISCECTOMY FUSION N/A 03/08/2018   Procedure: ANTERIOR CERVICAL DECOMPRESSION/DISCECTOMY FUSION, INTERBODY PROSTHESIS,PLATE/SCREWS CERVICAL FOUR- CERVICAL FIVE, CERVICAL FIVE- CERVICAL SIX, CERVICAL SIX- CERVICAL SEVEN;  Surgeon: Newman Pies, MD;  Location: [facility];  Service: Neurosurgery;  Laterality: N/A;  anterior  . CATARACT EXTRACTION     bilat, implants  . CHOLECYSTECTOMY  1995  . LUMBAR LAMINECTOMY  1982  . POLYPECTOMY  2003    neg 2006, Dr.Medoff  . tear duct surgery  1998   for excess tearing, skin graft L foot @ age 6 1/2    Review of systems negative except as noted in HPI / PMHx or noted below:  Review of Systems  Constitutional: Negative.   HENT: Negative.   Eyes: Negative.   Respiratory: Negative.   Cardiovascular: Negative.   Gastrointestinal: Negative.   Genitourinary: Negative.   Musculoskeletal: Negative.   Skin: Negative.   Neurological: Negative.   Endo/Heme/Allergies: Negative.   Psychiatric/Behavioral: Negative.      Objective:   Vitals:   10/09/20 0911  BP: 102/60  Pulse: 94  Resp: 14  SpO2: 100%   Height: 5' 10.5"  (179.1 cm)  Weight: 212 lb 6.4 oz (96.3 kg)   Physical Exam Constitutional:      Appearance: He is not diaphoretic.     Comments: Tachypneic with exertion  HENT:     Head: Normocephalic.     Right Ear: Tympanic membrane, ear canal and external ear normal.     Left Ear: Tympanic membrane, ear canal and external ear normal.     Nose: Nose normal. No mucosal edema or rhinorrhea.     Mouth/Throat:     Pharynx: Uvula midline. No oropharyngeal exudate.  Eyes:     Conjunctiva/sclera: Conjunctivae normal.  Neck:     Thyroid: No thyromegaly.     Trachea: Trachea normal. No tracheal tenderness or tracheal deviation.  Cardiovascular:     Rate and Rhythm: Normal rate and regular rhythm.  Heart sounds: Normal heart sounds, S1 normal and S2 normal. No murmur heard.   Pulmonary:     Effort: No respiratory distress.     Breath sounds: Normal breath sounds. No stridor. No wheezing or rales.  Lymphadenopathy:     Head:     Right side of head: No tonsillar adenopathy.     Left side of head: No tonsillar adenopathy.     Cervical: No cervical adenopathy.  Skin:    Findings: No erythema or rash.     Nails: There is no clubbing.  Neurological:     Mental Status: He is alert.     Diagnostics:    Spirometry was performed and demonstrated an FEV1 of 1.75 at 60 % of predicted.  Oxygen saturation at rest on room air was 92%.  Oxygen saturation walking the hallway on room air was 92%.  Results of a chest x-ray obtained 01 Oct 2020 identified the following:  Unchanged cardiomediastinal silhouette. Unchanged scarring/fibrotic change, right greater than left. No new focal airspace disease. No pleural effusion or pneumothorax. Thoracic spondylosis. No acute osseous abnormality. Partially visualized cervical spine fusion Hardware  Results of an echocardiogram obtained 08 January 2016 identified the following:  LVEF 60-65%, mild LVH, normal wall motion, diastolic dysfunction, indeterminate LV  filling pressure, normal LA size, trivial TR,RVSP 38 mmHg, normal IVC.   Assessment and Plan:   1. COPD with asthma ([place])   2. Interstitial lung disease (HCC)   3. Other allergic rhinitis   4. Chronic pansinusitis   5. LPRD (laryngopharyngeal reflux disease)   6. Herpes labialis     1. Continue to Treat inflammation:   A. Breztri - 2 inhalations 2 times per day   B. Tezepelumab injections every 4 weeks  2. Continue to Treat reflux:   A.  Pantoprazole 40 mg in the a.m. + famotidine 20 mg in the p.m.  3. CONTINUE Prednisone taper: prednisone 10 mg tablets - 1+1/2 daily for 2 weeks, then 1 daily for 2 weeks, then 1 every other day alternating with 1/2 every other day for 2 weeks (1,1/2,1,1/2...), then 1/2 tablet daily for 2 weeks, then 1/2 tablet every other day alternating with 0 tablets for 2 weeks (1/2, 0, 1/2, 0...)  4.  If needed:   A. ProAir HFA 2 puffs every 4-6 hours  B. nasal saline washes  C. OTC antihistamine  D. Mucinex DM 2 tablets twice a day  E. Duoneb nebulized every 4-6 hours if needed.  F. Nasacort one spray each nostril one time per day  G. Acyclovir 200 - 4 times a day during fever blister outbreak  5. Revisit with pulmonology  6. Obtain cardiology order ECHO  5. Return to clinic in 12 weeks or earlier if problem    Edwin [name] is maximally treated for his inflammatory component contributing to his respiratory tract symptoms and his reflux also appears to be under good control.  He will be having an echo performed by cardiology but I suspect that this is going to be relatively normal as was his previous echo of 2017.  I think his big issue is the fact that he has significant scarring of his airway from some type of fibrotic process that may or may not be a progressively active process that is responsible for the bulk of his respiratory tract symptoms.  We will refer him back to pulmonology regarding further evaluation for this issue.  He will continue on the  therapy noted above with a very slow  taper of his systemic steroids and continue on anti-inflammatory agents for his airway and therapy directed against reflux.  Allena Katz, MD Allergy / Immunology [place]

## 2020-10-09 NOTE — Patient Instructions (Addendum)
   1. Continue to Treat inflammation:   A. Breztri - 2 inhalations 2 times per day   B. Tezepelumab injections every 4 weeks  2. Continue to Treat reflux:   A.  Pantoprazole 40 mg in the a.m. + famotidine 20 mg in the p.m.  3. CONTINUE Prednisone taper: prednisone 10 mg tablets - 1+1/2 daily for 2 weeks, then 1 daily for 2 weeks, then 1 every other day alternating with 1/2 every other day for 2 weeks (1,1/2,1,1/2...), then 1/2 tablet daily for 2 weeks, then 1/2 tablet every other day alternating with 0 tablets for 2 weeks (1/2, 0, 1/2, 0...)  4.  If needed:   A. ProAir HFA 2 puffs every 4-6 hours  B. nasal saline washes  C. OTC antihistamine  D. Mucinex DM 2 tablets twice a day  E. Duoneb nebulized every 4-6 hours if needed.  F. Nasacort one spray each nostril one time per day  G. Acyclovir 200 - 4 times a day during fever blister outbreak  5. Revisit with pulmonology  6. Obtain cardiology order ECHO  5. Return to clinic in 12 weeks or earlier if problem

## 2020-10-09 NOTE — Telephone Encounter (Signed)
Please send referral for a revisit with pulmonology for LPRD Laryngopharyngeal reflux disease. Patient last saw [facility] with Dr. Melvyn Novas

## 2020-10-09 NOTE — Telephone Encounter (Signed)
Revisit with pulmonology for LPRD Laryngopharyngeal reflux disease. Patient last saw [facility] with Dr. Melvyn Novas.

## 2020-10-10 ENCOUNTER — Encounter: Payer: Self-pay | Admitting: Allergy and Immunology

## 2020-10-10 NOTE — Telephone Encounter (Signed)
Please send him to see Dr. Shearon Stalls or Dr. Wonda Amis, not Dr. Melvyn Novas per Dr. Neldon Mc

## 2020-10-10 NOTE — Telephone Encounter (Signed)
Patient referred to Dr. Shearon Stalls with [facility]. Patient informed referral was placed and told that if he does not hear from them by Friday to give them a call to schedule.

## 2020-10-15 ENCOUNTER — Other Ambulatory Visit: Payer: Self-pay

## 2020-10-15 ENCOUNTER — Ambulatory Visit: Payer: Medicare Other

## 2020-10-15 DIAGNOSIS — I2510 Atherosclerotic heart disease of native coronary artery without angina pectoris: Secondary | ICD-10-CM | POA: Diagnosis not present

## 2020-10-15 DIAGNOSIS — R0602 Shortness of breath: Secondary | ICD-10-CM

## 2020-10-15 NOTE — Progress Notes (Signed)
Low risk stress test and will discuss on OV  Lexiscan Tetrofosmin stress test 10/15/2020: Lexiscan nuclear stress test performed using 1-day protocol. Apical thinning on stress images likely physiological. Decreased tracer uptake in inferior/inferoseptal myocardium on rest images likely due to gut attenuation. Stress LVEF 57%. Low risk study

## 2020-10-16 NOTE — Progress Notes (Signed)
Echocardiogram 10/15/2020: Normal LV systolic function with EF 57%. Mild concentric hypertrophy of the left ventricle. Normal global wall motion. Left ventricle cavity is normal in size. Doppler evidence of grade I (impaired) diastolic dysfunction, normal LAP.  No significant valvular abnormality. IVC not well visualized.

## 2020-10-24 DIAGNOSIS — J4550 Severe persistent asthma, uncomplicated: Secondary | ICD-10-CM | POA: Diagnosis not present

## 2020-10-25 ENCOUNTER — Other Ambulatory Visit: Payer: Self-pay | Admitting: Cardiology

## 2020-10-25 DIAGNOSIS — I10 Essential (primary) hypertension: Secondary | ICD-10-CM

## 2020-10-25 DIAGNOSIS — R000 Tachycardia, unspecified: Secondary | ICD-10-CM

## 2020-10-26 ENCOUNTER — Other Ambulatory Visit: Payer: Self-pay | Admitting: Internal Medicine

## 2020-10-26 DIAGNOSIS — F3342 Major depressive disorder, recurrent, in full remission: Secondary | ICD-10-CM

## 2020-11-08 ENCOUNTER — Telehealth: Payer: Self-pay

## 2020-11-08 NOTE — Telephone Encounter (Signed)
Patient did not take the Verapamil, and he feels ok, a little dizzy still, but feels better. He wants to know if he is in any danger not taking it today, and should he come in tomorrow for a nurse visit blood pressure reading, as he does not have a BP machine. Please advise.

## 2020-11-08 NOTE — Telephone Encounter (Signed)
Patient called and stated that after taking the Verapamil 180mg  that he takes every morning, is making him extremely dizzy and it is getting worse by the day. He read on the bottle "DO NOT SKIP OR DISCONTINUE", so he is concerned that if he stops it, it could be bad, or if he continues it, his dizziness may get worse.   He has not taken the one from this morning, and wants to wait for a response before he does take it. Please advise.

## 2020-11-08 NOTE — Telephone Encounter (Signed)
Please obtain heart rate and blood pressure readings if possible and let me know.

## 2020-11-09 NOTE — Telephone Encounter (Signed)
Advised him over my chart messaging

## 2020-11-12 ENCOUNTER — Other Ambulatory Visit: Payer: Self-pay

## 2020-11-12 ENCOUNTER — Ambulatory Visit (INDEPENDENT_AMBULATORY_CARE_PROVIDER_SITE_OTHER): Payer: Medicare Other | Admitting: Internal Medicine

## 2020-11-12 ENCOUNTER — Encounter: Payer: Self-pay | Admitting: Internal Medicine

## 2020-11-12 VITALS — BP 112/70 | HR 90 | Temp 97.4°F | Ht 68.0 in | Wt 206.6 lb

## 2020-11-12 DIAGNOSIS — R0602 Shortness of breath: Secondary | ICD-10-CM

## 2020-11-12 DIAGNOSIS — J449 Chronic obstructive pulmonary disease, unspecified: Secondary | ICD-10-CM

## 2020-11-12 DIAGNOSIS — J8489 Other specified interstitial pulmonary diseases: Secondary | ICD-10-CM

## 2020-11-12 DIAGNOSIS — R768 Other specified abnormal immunological findings in serum: Secondary | ICD-10-CM

## 2020-11-12 DIAGNOSIS — J986 Disorders of diaphragm: Secondary | ICD-10-CM

## 2020-11-12 LAB — SEDIMENTATION RATE: Sed Rate: 23 mm/hr — ABNORMAL HIGH (ref 0–20)

## 2020-11-12 LAB — C-REACTIVE PROTEIN: CRP: <1 mg/dL (ref 0.5–20.0)

## 2020-11-12 LAB — CK: Total CK: 118 U/L (ref 7–232)

## 2020-11-12 NOTE — Patient Instructions (Addendum)
The patient should have follow up scheduled with myself in 1 months.   Prior to next visit patient should have:  Sniff Test  Blood work   Return paperwork at next visit

## 2020-11-12 NOTE — Progress Notes (Signed)
Edwin [name]    [id number]    1941/03/26  Primary Care Physician:Jones, Arvid Right, MD Date of Appointment: 11/12/2020 New Patient Evaluation, Referred by Dr. Neldon Mc  Chief complaint:   Chief Complaint  Patient presents with   Consult    Shortness of breath when lying down and with exertion, worse over last 5 years. Having dizzy spells, not sure if from medication.       HPI: Edwin [name] is a 80 y.o. gentleman with former tobacco use disorder who presents for new patient evaluation of shortness of breath.   He has had long standing shortness of breath and been diagnosed with asthma with COPD overlap syndrome. Currently Seeing Dr. Neldon Mc and is on anti-TSLP therapy. No childhood asthma or respiratory disease.  He gets recurrent exacerbations for his breathing and gets steroids for asthma but thinks they only help a little - it's not a dramatic improvement.At one point he was on prednisone for 3 months - and has had some depo-medrol shots as well. He has had 4 tespire injections so far - not sure if it's helping yet.   He coughs daily and brings up thick mucus. He does have wheezing and chest tightness. He does not experience relief with albuterol inhaler.   Has had some reflux and is on PPI currently. 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 No birds or chickens No water damage or mold exposure in home No hot tub at home No myalgias +arthralgias in hands Denies skin rash or lesions Denies nail changes or finger splitting + Raynaud's Denies dry eyes or dry mouth Denies dysphagia or heartburn PPD negative and no h/o exposure No h/o chemo/XRT/amiodarone/macrodantin/MTX Lifelong resident of [place] - from [place] Travel history - nothing international in the last 5 years. Has been to [place] and [place] in the past.   Denies lightheadedness, syncope  or h/o seizure Denies vision changes Denies atopy or sinusitis Denies excessive thirst or urination. No h/o DM Denies wt change or heat or cold intolerance. 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  Exposures: lives at home with his wife.  Smoking history: 50 pack years, quit in [place] History   Occupation: Charity fundraiser   Occupation: PRESIDENT    Employer: [employer]  Tobacco Use   Smoking status: Former    Packs/day: 2.00    Years: 25.00    Pack years: 50.00    Types: Cigarettes    Quit date: 06/03/1983    Years since quitting: 37.4   Smokeless tobacco: Never   Tobacco comments:    up to 2 ppd  Vaping Use   Vaping Use: Never used  Substance and Sexual Activity   Alcohol use: No   Drug use: No   Sexual activity: Yes    Relevant family history:  Family History  Problem Relation Age of Onset   Heart attack Father 42       due to asthma flare   Asthma Father    Emphysema Father    Hypertension Mother    Cancer Mother         ? primary  Asthma Son        x2   Allergic rhinitis Neg Hx    Angioedema Neg Hx    Atopy Neg Hx    Eczema Neg Hx    Immunodeficiency Neg Hx    Urticaria Neg Hx     Past Medical History:  Diagnosis Date   Asthma    COPD (chronic obstructive pulmonary disease) ([place])    reactive airway disease   Glaucoma    Gout    Hx of colonic polyps    Dr Earlean Shawl   Hyperlipidemia    NMR lipoprofile 2005: LDL 113(1416/825), HLD 37, TG 190.   Hypertension    Pneumonia     Past Surgical History:  Procedure Laterality Date   ANTERIOR CERVICAL DECOMP/DISCECTOMY FUSION N/A 03/08/2018   Procedure: ANTERIOR CERVICAL DECOMPRESSION/DISCECTOMY FUSION, INTERBODY PROSTHESIS,PLATE/SCREWS CERVICAL FOUR- CERVICAL FIVE, CERVICAL FIVE- CERVICAL SIX,  CERVICAL SIX- CERVICAL SEVEN;  Surgeon: Newman Pies, MD;  Location: [place];  Service: Neurosurgery;  Laterality: N/A;  anterior   CATARACT EXTRACTION     bilat, implants   [place]   POLYPECTOMY  2003    neg 2006, Dr.Medoff   tear duct surgery  1998   for excess tearing, skin graft L foot @ age 62 1/2    Physical Exam: Blood pressure 112/70, pulse 90, temperature (!) 97.4 F (36.3 C), temperature source Temporal, height 5\' 8"  (1.727 m), weight 206 lb 9.6 oz (93.7 kg), SpO2 97 %. Gen:      No acute distress, HOH ENT:  no nasal polyps, mucus membranes moist Lungs:    No increased respiratory effort, symmetric chest wall excursion, bibasilar crackles R>L CV:         Regular rate and rhythm; no murmurs, rubs, or gallops.  No pedal edema MSK: no acute synovitis of DIP or PIP joints, no mechanics hands.  Skin:      Warm and dry; no rashes Neuro: normal speech, no focal facial asymmetry Psych: alert and oriented x3, normal mood and affect  Data Reviewed/Medical Decision Making:  Independent interpretation of tests: Imaging:  Review of patient's CT Chest from 2019 and 2022 images revealed peripheral ground glass opacities with traction bronchiectasis in a craniocaudal distribution, R>L side. Mild progression in the last three years. Background of centrilobular emphysema.  There is also disproportionate elevation of the right hemidiaphragm.The patient's images have been independently reviewed by me.    PFTs: I have personally reviewed the patient's PFTs and last full set of PFTs in 2019 show moderate restriction to ventilation with reduced diffusion capacity. Spirometry done May 2022 shows no airflow limitation - FVC has dropped to 2.32 from 2.85 in 2019 which is a significant drop.   PFT Results Latest Ref Rng & Units 12/11/2017 09/09/2017  FVC-Pre L 2.85 2.88  FVC-Predicted Pre % 66 67  FVC-Post L 2.90 -  FVC-Predicted Post % 67 -  Pre FEV1/FVC %  % 75 74  Post FEV1/FCV % % 75 -  FEV1-Pre L 2.13 2.14  FEV1-Predicted Pre % 69 69  FEV1-Post L 2.18 -  DLCO uncorrected ml/min/mmHg 18.18 18.65  DLCO UNC% % 55 56  DLVA Predicted % 84 82  TLC L 5.36 5.14  TLC % Predicted % 75 71  RV % Predicted % 94 84    Labs:  2019 ANA, CCP, SSA SSB normal. IgE elevated 588 Immunocap testing shows reactivity to yeast, dust, aspergillus, grass, cat and dog dander  Lab  Results  Component Value Date   WBC 9.1 10/01/2020   HGB 14.1 10/01/2020   HCT 42.0 10/01/2020   MCV 97.6 10/01/2020   PLT 144.0 (L) 10/01/2020   Lab Results  Component Value Date   NA 140 10/01/2020   K 4.0 10/01/2020   CL 100 10/01/2020   CO2 28 10/01/2020     Immunization status:  Immunization History  Administered Date(s) Administered   Influenza Split 02/19/2012   Influenza Whole 03/09/2010, 03/17/2011   Influenza, High Dose Seasonal PF 01/18/2015, 03/03/2016, 02/17/2017, 02/22/2018, 01/17/2019   Influenza-Unspecified 03/11/2013, 03/04/2014   PFIZER(Purple Top)SARS-COV-2 Vaccination 06/23/2019, 07/13/2019, 02/20/2020   Pneumococcal Conjugate-13 03/09/2008, 01/23/2016   Pneumococcal Polysaccharide-23 07/08/2012, 09/08/2019   Td 06/02/2006   Tdap 01/23/2016   Zoster Recombinat (Shingrix) 01/17/2019, 05/14/2019   Zoster, Live 06/26/2010     I reviewed prior external note(s) from Dr. Neldon Mc, Dr. Melvyn Novas  I reviewed the result(s) of the labs and imaging as noted above.   I have ordered PFT - full   Assessment:  Shortness of breath NSIP Elevated Right Hemidiaphragm Asthma COPD Overlap Syndrome   Plan/Recommendations:  Mr. Delpilar has longstanding dyspnea which has worsened over the past 5 years.  He has a background of emphysema from prior tobacco use, but also has a slowly progressing NSIP.  He has had some preliminary work-up with rheumatologic studies in 2019, but he has ongoing Raynaud's and arthralgias which I do think warrant additional testing.  I will  have him follow-up in ILD packet as well.  Complicating the picture is his elevated right hemidiaphragm which has been present for at least the past 13 years.  This may be acquired either from viral infection versus when he had his gallbladder taken out.  We will get a sniff test to confirm because I think this would be diagnostically satisfying for him.  His FVC has dropped substantially in the last 4 years and this may be related to his worsening NSIP but may also be related to his worsening diaphragm weakness.  We will get a sniff test blood work and have him follow the ILD questionnaire.  Continue current medications for now.  I will see him back in 4 weeks. We discussed disease management and progression at length today regarding multiple etiologies for his dyspnea.    Return in about 4 weeks (around 12/10/2020).   Lenice Llamas, MD [facility] [place]

## 2020-11-13 LAB — ANTI-SCLERODERMA ANTIBODY: Scleroderma (Scl-70) (ENA) Antibody, IgG: <1 AI

## 2020-11-13 LAB — ANA: Anti Nuclear Antibody (ANA): NEGATIVE

## 2020-11-13 LAB — ANTI-DNA ANTIBODY, DOUBLE-STRANDED: ds DNA Ab: <1 IU/mL

## 2020-11-13 LAB — ALDOLASE: Aldolase: 3 U/L (ref <=8.1)

## 2020-11-13 LAB — RHEUMATOID FACTOR: Rheumatoid fact SerPl-aCnc: <14 IU/mL (ref <14)

## 2020-11-13 LAB — CYCLIC CITRUL PEPTIDE ANTIBODY, IGG: Cyclic Citrullin Peptide Ab: <16 UNITS

## 2020-11-14 LAB — RNP ANTIBODIES: ENA RNP Ab: <0.2 AI (ref 0.0–0.9)

## 2020-11-15 ENCOUNTER — Other Ambulatory Visit: Payer: Self-pay

## 2020-11-15 ENCOUNTER — Ambulatory Visit (HOSPITAL_COMMUNITY)
Admission: RE | Admit: 2020-11-15 | Discharge: 2020-11-15 | Disposition: A | Discharge status: Home / Self Care | Payer: Medicare Other | Source: Ambulatory Visit | Attending: Internal Medicine | Admitting: Internal Medicine

## 2020-11-15 DIAGNOSIS — R0602 Shortness of breath: Secondary | ICD-10-CM | POA: Diagnosis not present

## 2020-11-15 DIAGNOSIS — J986 Disorders of diaphragm: Secondary | ICD-10-CM | POA: Insufficient documentation

## 2020-11-15 DIAGNOSIS — R0600 Dyspnea, unspecified: Secondary | ICD-10-CM | POA: Diagnosis not present

## 2020-11-19 ENCOUNTER — Ambulatory Visit: Payer: Medicare Other | Admitting: Cardiology

## 2020-11-19 ENCOUNTER — Encounter: Payer: Self-pay | Admitting: Cardiology

## 2020-11-19 ENCOUNTER — Other Ambulatory Visit: Payer: Self-pay

## 2020-11-19 VITALS — BP 102/62 | HR 93 | Temp 98.4°F | Resp 16 | Ht 68.0 in | Wt 208.6 lb

## 2020-11-19 DIAGNOSIS — R0602 Shortness of breath: Secondary | ICD-10-CM | POA: Diagnosis not present

## 2020-11-19 DIAGNOSIS — I2510 Atherosclerotic heart disease of native coronary artery without angina pectoris: Secondary | ICD-10-CM

## 2020-11-19 DIAGNOSIS — J432 Centrilobular emphysema: Secondary | ICD-10-CM | POA: Diagnosis not present

## 2020-11-19 DIAGNOSIS — E7800 Pure hypercholesterolemia, unspecified: Secondary | ICD-10-CM

## 2020-11-19 NOTE — Progress Notes (Signed)
Primary Physician/Referring:  Janith Lima, MD  Patient ID: Edwin [name], male    DOB: [birth year], 80 y.o.   MRN: [id number]  Chief Complaint  Patient presents with   Coronary Artery Disease   Tachycardia   Shortness of Breath   Follow-up    6 weeks   HPI:    Edwin [name]  is a 80 y.o. Caucasian male patient with prior history of greater than 60-70-pack-year history of smoking, quit in 1985, mild pulmonary fibrosis, chronic dyspnea, hyperglycemia, hypertension and hyperlipidemia referred to me for evaluation of abnormal EKG and dyspnea on exertion and coronary atherosclerosis noted on the CT scan.  He has noticed gradually worsening dyspnea.  Due to coronary calcification and hypertension and hyperlipidemia as risk factors, he underwent echocardiogram and stress test who presents for follow-up.  He has now been evaluated by pulmonary critical care, and found to have elevated right hemidiaphragm.  No new symptoms, states that he has made lifestyle changes with regard to evidence of excess carbohydrates and fat since his triglycerides were elevated after his last office visit.  No new symptomatology.  Past Medical History:  Diagnosis Date   Asthma    COPD (chronic obstructive pulmonary disease) ([place])    reactive airway disease   Glaucoma    Gout    Hx of colonic polyps    Dr Earlean Shawl   Hyperlipidemia    NMR lipoprofile 2005: LDL 113(1416/825), HLD 37, TG 190.   Hypertension    Pneumonia    Past Surgical History:  Procedure Laterality Date   ANTERIOR CERVICAL DECOMP/DISCECTOMY FUSION N/A 03/08/2018   Procedure: ANTERIOR CERVICAL DECOMPRESSION/DISCECTOMY FUSION, INTERBODY PROSTHESIS,PLATE/SCREWS CERVICAL FOUR- CERVICAL FIVE, CERVICAL FIVE- CERVICAL SIX, CERVICAL SIX- CERVICAL SEVEN;  Surgeon: Newman Pies, MD;  Location: [place];  Service: Neurosurgery;  Laterality: N/A;  anterior   CATARACT EXTRACTION     bilat, implants   [place]   POLYPECTOMY  2003    neg 2006, Dr.Medoff   tear duct surgery  1998   for excess tearing, skin graft L foot @ age 57 1/2   Family History  Problem Relation Age of Onset   Heart attack Father 69       due to asthma flare   Asthma Father    Emphysema Father    Hypertension Mother    Cancer Mother         ? primary   Asthma Son        x2   Allergic rhinitis Neg Hx    Angioedema Neg Hx    Atopy Neg Hx    Eczema Neg Hx    Immunodeficiency Neg Hx    Urticaria Neg Hx     Social History   Tobacco Use   Smoking status: Former    Packs/day: 2.00    Years: 25.00    Pack years: 50.00    Types: Cigarettes    Quit date: 06/03/1983    Years since quitting: 37.4   Smokeless tobacco: Never   Tobacco comments:    up to 2 ppd  Substance Use Topics   Alcohol use: No   Marital Status: Married  ROS  Review of Systems  Constitutional: Negative.  Cardiovascular:  Positive for dyspnea on exertion. Negative for chest pain and leg swelling.  Endocrine: Negative.   Skin: Negative.   Musculoskeletal:  Positive for arthritis.  Gastrointestinal:  Negative for melena.  Genitourinary: Negative.   All  other systems reviewed and are negative. Objective  Blood pressure 102/62, pulse 93, temperature 98.4 F (36.9 C), temperature source Temporal, resp. rate 16, height 5\' 8"  (1.727 m), weight 208 lb 9.6 oz (94.6 kg), SpO2 93 %. Body mass index is 31.72 kg/m.  Vitals with BMI 11/19/2020 11/12/2020 10/09/2020  Height 5\' 8"  5\' 8"  5' 10.5"  Weight 208 lbs 10 oz 206 lbs 10 oz 212 lbs 6 oz  BMI 31.72 63.89 37.34  Systolic 287 681 157  Diastolic 62 70 60  Pulse 93 90 94     Physical Exam  Constitutional: No distress.  Eyes: Conjunctivae are normal.  Neck: No JVD present.  Cardiovascular: Regular rhythm, normal heart sounds, intact distal pulses and normal pulses. Exam reveals no gallop.  No murmur heard. Pulmonary/Chest: Effort normal. He has bibasilar rales. He exhibits no tenderness.   Abdominal: Soft. Bowel sounds are normal.  Musculoskeletal:        General: No edema. Normal range of motion.     Cervical back: Neck supple.  Neurological: He is alert and oriented to person, place, and time.  Skin: Skin is warm.    Laboratory examination:   Recent Labs    10/01/20 1406  NA 140  K 4.0  CL 100  CO2 28  GLUCOSE 117*  BUN 24*  CREATININE 1.14  CALCIUM 9.5   CrCl cannot be calculated (Patient's most recent lab result is older than the maximum 21 days allowed.).  CMP Latest Ref Rng & Units 10/01/2020 09/08/2019 08/18/2018  Glucose 70 - 99 mg/dL 117(H) 138(H) 125(H)  BUN 6 - 23 mg/dL 24(H) 22 23  Creatinine 0.40 - 1.50 mg/dL 1.14 1.17 1.15  Sodium 135 - 145 mEq/L 140 138 138  Potassium 3.5 - 5.1 mEq/L 4.0 3.9 3.9  Chloride 96 - 112 mEq/L 100 101 98  CO2 19 - 32 mEq/L 28 27 32  Calcium 8.4 - 10.5 mg/dL 9.5 9.5 9.4  Total Protein 6.0 - 8.3 g/dL 7.2 7.1 7.1  Total Bilirubin 0.2 - 1.2 mg/dL 0.8 0.6 0.7  Alkaline Phos 39 - 117 U/L 45 52 61  AST 0 - 37 U/L 28 34 19  ALT 0 - 53 U/L 36 25 21   CBC Latest Ref Rng & Units 10/01/2020 09/08/2019 08/18/2018  WBC 4.0 - 10.5 K/uL 9.1 7.0 10.8(H)  Hemoglobin 13.0 - 17.0 g/dL 14.1 14.8 15.0  Hematocrit 39.0 - 52.0 % 42.0 43.4 44.7  Platelets 150.0 - 400.0 K/uL 144.0(L) 156.0 218.0    Lipid Panel Recent Labs    10/01/20 1406  CHOL 192  TRIG 284.0*  VLDL 56.8*  HDL 70.90  CHOLHDL 3  LDLDIRECT 79.0   Lipid Panel     Component Value Date/Time   CHOL 192 10/01/2020 1406   TRIG 284.0 (H) 10/01/2020 1406   HDL 70.90 10/01/2020 1406   CHOLHDL 3 10/01/2020 1406   VLDL 56.8 (H) 10/01/2020 1406   LDLCALC 43 09/08/2019 0940   LDLDIRECT 79.0 10/01/2020 1406     HEMOGLOBIN A1C Lab Results  Component Value Date   HGBA1C 6.6 (H) 10/01/2020   TSH Recent Labs    10/01/20 1406  TSH 1.33    Medications and allergies   Allergies  Allergen Reactions   Ramipril     REACTION: chest congestion & cough. No angioedema      Current Outpatient Medications on File Prior to Visit  Medication Sig Dispense Refill   albuterol (VENTOLIN HFA) 108 (90 Base) MCG/ACT inhaler INHALE  1-2 PUFFS INTO THE LUNGS EVERY 4 HOURS AS NEEDED FOR WHEEZING OR SHORTNESS OF BREATH 18 g 3   allopurinol (ZYLOPRIM) 300 MG tablet Take 1 tablet (300 mg total) by mouth daily. 90 tablet 1   aspirin (ASPIRIN CHILDRENS) 81 MG chewable tablet Chew 1 tablet (81 mg total) by mouth daily.     atorvastatin (LIPITOR) 20 MG tablet TAKE 1 TABLET BY MOUTH EVERY DAY 90 tablet 0   brimonidine (ALPHAGAN P) 0.1 % SOLN Place 1 drop into both eyes 2 (two) times daily. Both eyes     Budeson-Glycopyrrol-Formoterol (BREZTRI AEROSPHERE) 160-9-4.8 MCG/ACT AERO Inhale 2 puffs into the lungs in the morning and at bedtime. 10.7 g 5   Cyanocobalamin (VITAMIN B 12 PO) Take 1 tablet by mouth daily.     dextromethorphan-guaiFENesin (MUCINEX DM) 30-600 MG 12hr tablet Take 1 tablet by mouth 2 (two) times daily as needed for cough.      EPINEPHrine 0.3 mg/0.3 mL IJ SOAJ injection Inject 0.3 mLs (0.3 mg total) into the muscle once. (Patient taking differently: Inject 0.3 mg into the muscle daily as needed (anaphylactic allergic reactions).) 2 Device 1   famotidine (PEPCID) 20 MG tablet TAKE 1 TABLET BY MOUTH EVERYDAY AT BEDTIME 90 tablet 1   FLUoxetine (PROZAC) 10 MG capsule TAKE 1 CAPSULE BY MOUTH EVERY DAY 90 capsule 1   LUMIGAN 0.01 % SOLN      mometasone (ELOCON) 0.1 % ointment Apply topically once daily as needed 45 g 1   olmesartan-hydrochlorothiazide (BENICAR HCT) 40-12.5 MG tablet Take 1 tablet by mouth daily. 90 tablet 1   pantoprazole (PROTONIX) 40 MG tablet TAKE 1 TABLET BY MOUTH EVERY DAY IN THE MORNING 90 tablet 1   psyllium (METAMUCIL) 58.6 % powder Take 1 packet by mouth daily.     Tezepelumab-ekko (TEZSPIRE) 210 MG/1.91ML SOSY Inject 210 mg into the skin every 28 (twenty-eight) days. 1.91 mL 0   No current facility-administered medications on file prior to  visit.     Radiology:   CT of the abdomen 05/29/2017:  1. Sigmoid diverticulosis with perhaps mild sigmoid wall thickening. Mild or resolving diverticulitis or colitis is possible, but there is no mesenteric inflammation. See also #2. 2. No other acute or inflammatory process in the abdomen or pelvis, however, patchy opacity in the left lower lobe could reflect Bronchopneumonia. No left pleural effusion. Query cough. 3. Aortic Atherosclerosis (ICD10-I70.0). Fatty liver disease. Left nephrolithiasis. Lumbar facet and right hip degeneration.  Chest x-ray 10/01/2020: Unchanged cardiomediastinal silhouette. Unchanged scarring/fibrotic change, right greater than left. No new focal airspace disease. No pleural effusion or pneumothorax. Thoracic spondylosis. No acute osseous abnormality. Partially visualized cervical spine fusion hardware  High resolution CT scan 06/22/2020: 1. Spectrum of findings suggestive of a mild basilar predominant fibrotic interstitial lung disease without frank honeycombing, without appreciable interval progression. Findings are indeterminate for UIP per consensus guidelines: Diagnosis of Idiopathic PulmonaryFibrosis:  2. Three-vessel coronary atherosclerosis. 3. Chronic mild-to-moderate elevation of the right hemidiaphragm. 4. Aortic Atherosclerosis  Cardiac Studies:   Echocardiogram 10/15/2020: Normal LV systolic function with EF 57%. Mild concentric hypertrophy of the left ventricle. Normal global wall motion. Left ventricle cavity is normal in size. Doppler evidence of grade I (impaired) diastolic dysfunction, normal LAP.  No significant valvular abnormality. IVC not well visualized.  No significant change from 01/08/2016.  Lexiscan Tetrofosmin stress test 10/15/2020: Lexiscan nuclear stress test performed using 1-day protocol. Apical thinning on stress images likely physiological. Decreased tracer uptake in inferior/inferoseptal myocardium on  rest images likely due  to gut attenuation. Stress LVEF 57%. Low risk study  EKG:   EKG 10/03/2020: Sinus tachycardia at rate of 109 bpm. Right bundle branch block.  Low-voltage complexes.  Pulmonary disease pattern.   Assessment     ICD-10-CM   1. Coronary artery calcification seen on CAT scan  I25.10     2. SOB (shortness of breath)  R06.02     3. Centrilobular emphysema ([place])  J43.2     4. Hypercholesteremia  E78.00        Medications Discontinued During This Encounter  Medication Reason   acyclovir (ZOVIRAX) 200 MG capsule Error   verapamil (CALAN-SR) 120 MG CR tablet Error    No orders of the defined types were placed in this encounter.  No orders of the defined types were placed in this encounter.  Recommendations:   Edwin [name] is a 80 y.o. Caucasian male patient with prior history of greater than 60-70-pack-year history of smoking, quit in 1985, mild pulmonary fibrosis, chronic dyspnea, hyperglycemia, hypertension and hyperlipidemia referred to me for evaluation of abnormal EKG and dyspnea on exertion and coronary atherosclerosis noted on the CT scan.  His symptoms of dyspnea are multifactorial including presence of COPD and emphysema, pulmonary fibrosis, chronically elevated right hemidiaphragm, mild overweight/obesity, deconditioning and age.    His nuclear stress test and echocardiogram are essentially low risk.  His blood pressure is well controlled, I had started him on verapamil for underlying sinus tachycardia and hypertension number he developed marked dizziness.  His blood pressure without antihypertensive medication is normal and he has discontinued taking verapamil.  We could certainly continue to watch his blood pressure closely and not start him on any medications for now.  Suspect his underlying sinus tachycardia is related to COPD and restrictive lung disease.  Pulmonary evaluation is ongoing.  With regard to mixed hyperlipidemia, patient has made significant lifestyle  changes.  Suspect his triglycerides will improve and he will need follow-up lipid profile testing at some point during his next annual visit.  He has new onset diabetes mellitus, would have a low threshold to start him on metformin.  Otherwise overall low risk from cardiac standpoint, no clinical evidence of heart failure, I will see him back on a as needed basis.  Patient was extremely pleased and satisfied with the explanation and he will call us if cardiac issues were to arise.  I thank Dr. Scarlette Calico for sending this pleasant patient to me for evaluation.    Adrian Prows, MD, [facility] 11/19/2020, 11:56 PM Office: [phone]

## 2020-11-20 ENCOUNTER — Ambulatory Visit: Payer: Medicare Other | Admitting: Allergy and Immunology

## 2020-11-21 DIAGNOSIS — J4550 Severe persistent asthma, uncomplicated: Secondary | ICD-10-CM | POA: Diagnosis not present

## 2020-11-24 ENCOUNTER — Other Ambulatory Visit: Payer: Self-pay | Admitting: Cardiology

## 2020-11-24 DIAGNOSIS — R000 Tachycardia, unspecified: Secondary | ICD-10-CM

## 2020-11-24 DIAGNOSIS — I10 Essential (primary) hypertension: Secondary | ICD-10-CM

## 2020-12-04 ENCOUNTER — Other Ambulatory Visit: Payer: Self-pay | Admitting: Internal Medicine

## 2020-12-04 DIAGNOSIS — E785 Hyperlipidemia, unspecified: Secondary | ICD-10-CM

## 2020-12-17 DIAGNOSIS — H53431 Sector or arcuate defects, right eye: Secondary | ICD-10-CM | POA: Diagnosis not present

## 2020-12-17 DIAGNOSIS — H52223 Regular astigmatism, bilateral: Secondary | ICD-10-CM | POA: Diagnosis not present

## 2020-12-17 DIAGNOSIS — H401132 Primary open-angle glaucoma, bilateral, moderate stage: Secondary | ICD-10-CM | POA: Diagnosis not present

## 2020-12-17 DIAGNOSIS — H353112 Nonexudative age-related macular degeneration, right eye, intermediate dry stage: Secondary | ICD-10-CM | POA: Diagnosis not present

## 2020-12-17 DIAGNOSIS — H5203 Hypermetropia, bilateral: Secondary | ICD-10-CM | POA: Diagnosis not present

## 2020-12-17 DIAGNOSIS — H524 Presbyopia: Secondary | ICD-10-CM | POA: Diagnosis not present

## 2020-12-19 DIAGNOSIS — J4550 Severe persistent asthma, uncomplicated: Secondary | ICD-10-CM | POA: Diagnosis not present

## 2020-12-21 DIAGNOSIS — B078 Other viral warts: Secondary | ICD-10-CM | POA: Diagnosis not present

## 2020-12-21 DIAGNOSIS — C44529 Squamous cell carcinoma of skin of other part of trunk: Secondary | ICD-10-CM | POA: Diagnosis not present

## 2020-12-21 DIAGNOSIS — D225 Melanocytic nevi of trunk: Secondary | ICD-10-CM | POA: Diagnosis not present

## 2020-12-25 ENCOUNTER — Ambulatory Visit (INDEPENDENT_AMBULATORY_CARE_PROVIDER_SITE_OTHER): Payer: Medicare Other | Admitting: Internal Medicine

## 2020-12-25 ENCOUNTER — Encounter: Payer: Self-pay | Admitting: Internal Medicine

## 2020-12-25 ENCOUNTER — Other Ambulatory Visit: Payer: Self-pay

## 2020-12-25 VITALS — BP 116/64 | HR 91 | Temp 98.0°F | Ht 70.0 in | Wt 205.8 lb

## 2020-12-25 DIAGNOSIS — J986 Disorders of diaphragm: Secondary | ICD-10-CM

## 2020-12-25 DIAGNOSIS — R0602 Shortness of breath: Secondary | ICD-10-CM

## 2020-12-25 NOTE — Patient Instructions (Addendum)
Please schedule follow up scheduled with myself in 2-4 weeks.  If my schedule is not open yet, we will contact you with a reminder closer to that time.  Before your next visit I would like you to have: PFT - sitting and supine spirometry.   The surgery we discussed is called diaphgramatic plication.   I think your cough is related to Reflux.    What is GERD? Gastroesophageal reflux disease (GERD) is gastroesophageal reflux diseasewhich occurs when the lower esophageal sphincter (LES) opens spontaneously, for varying periods of time, or does not close properly and stomach contents rise up into the esophagus. GER is also called acid reflux or acid regurgitation, because digestive juices--called acids--rise up with the food. The esophagus is the tube that carries food from the mouth to the stomach. The LES is a ring of muscle at the bottom of the esophagus that acts like a valve between the esophagus and stomach.  When acid reflux occurs, food or fluid can be tasted in the back of the mouth. When refluxed stomach acid touches the lining of the esophagus it may cause a burning sensation in the chest or throat called heartburn or acid indigestion. Occasional reflux is common. Persistent reflux that occurs more than twice a week is considered GERD, and it can eventually lead to more serious health problems. People of all ages can have GERD. Studies have shown that GERD may worsen or contribute to asthma, chronic cough, and pulmonary fibrosis.   What are the symptoms of GERD? The main symptom of GERD in adults is frequent heartburn, also called acid indigestion--burning-type pain in the lower part of the mid-chest, behind the breast bone, and in the mid-abdomen.  Not all reflux is acidic in nature, and many patients don't have heart burn at all. Sometimes it feels like a cough (either dry or with mucus), choking sensation, asthma, shortness of breath, waking up at night, frequent throat clearing, or  trouble swallowing.    What causes GERD? The reason some people develop GERD is still unclear. However, research shows that in people with GERD, the LES relaxes while the rest of the esophagus is working. Anatomical abnormalities such as a hiatal hernia may also contribute to GERD. A hiatal hernia occurs when the upper part of the stomach and the LES move above the diaphragm, the muscle wall that separates the stomach from the chest. Normally, the diaphragm helps the LES keep acid from rising up into the esophagus. When a hiatal hernia is present, acid reflux can occur more easily. A hiatal hernia can occur in people of any age and is most often a normal finding in otherwise healthy people over age 57. Most of the time, a hiatal hernia produces no symptoms.   Other factors that may contribute to GERD include - Obesity or recent weight gain - Pregnancy  - Smoking  - Diet - Certain medications  Common foods that can worsen reflux symptoms include: - carbonated beverages - artificial sweeteners - citrus fruits  - chocolate  - drinks with caffeine or alcohol  - fatty and fried foods  - garlic and onions  - mint flavorings  - spicy foods  - tomato-based foods, like spaghetti sauce, salsa, chili, and pizza   Lifestyle Changes If you smoke, stop.  Avoid foods and beverages that worsen symptoms (see above.) Lose weight if needed.  Eat small, frequent meals.  Wear loose-fitting clothes.  Avoid lying down for 3 hours after a meal.  Raise the head  of your bed 6 to 8 inches by securing wood blocks under the bedposts. Just using extra pillows will not help, but using a wedge-shaped pillow may be helpful.  Medications  H2 blockers, such as cimetidine (Tagamet HB), famotidine (Pepcid AC), nizatidine (Axid AR), and ranitidine (Zantac 75), decrease acid production. They are available in prescription strength and over-the-counter strength. These drugs provide short-term relief and are effective for  about half of those who have GERD symptoms.  Proton pump inhibitors include omeprazole (Prilosec, Zegerid), lansoprazole (Prevacid), pantoprazole (Protonix), rabeprazole (Aciphex), and esomeprazole (Nexium), which are available by prescription. Prilosec is also available in over-the-counter strength. Proton pump inhibitors are more effective than H2 blockers and can relieve symptoms and heal the esophageal lining in almost everyone who has GERD.  Because drugs work in different ways, combinations of medications may help control symptoms. People who get heartburn after eating may take both antacids and H2 blockers. The antacids work first to neutralize the acid in the stomach, and then the H2 blockers act on acid production. By the time the antacid stops working, the H2 blocker will have stopped acid production. Your health care provider is the best source of information about how to use medications for GERD.   Points to Remember 1. You can have GERD without having heartburn. Your symptoms could include a dry cough, asthma symptoms, or trouble swallowing.  2. Taking medications daily as prescribed is important in controlling you symptoms.  Sometimes it can take up to 8 weeks to fully achieve the effects of the medications prescribed.  3. Coughing related to GERD can be difficult to treat and is very frustrating!  However, it is important to stick with these medications and lifestyle modifications before pursuing more aggressive or invasive test and treatments.

## 2020-12-25 NOTE — Progress Notes (Signed)
Edwin [name]    [id number]    Apr 20, 1941  Primary Care Physician:Jones, Arvid Right, MD Date of Appointment: 12/25/2020 Established Patient Visit  Chief complaint:   Chief Complaint  Patient presents with   Follow-up    Pt states he has been about the same since last visit. States SOB is about the same. States he is fine sitting at rest and when lying down but has the SOB with exertion and also has some complaints of coughing and wheezing.     HPI: Edwin [name] is a 80 y.o. man with shortness of breath, ILD, elevated right diaphgram, asthma.   Interval Updates: Here for follow up after sniff test showed right diaphragm hemiparesis. Still sob with minimal exertion. Has coughing worse with bending forwards. No chest tightness. No improvement in symptoms yet with tespire.   I have reviewed the patient's family social and past medical history and updated as appropriate.   Past Medical History:  Diagnosis Date   Asthma    COPD (chronic obstructive pulmonary disease) ([place])    reactive airway disease   Glaucoma    Gout    Hx of colonic polyps    Dr Earlean Shawl   Hyperlipidemia    NMR lipoprofile 2005: LDL 113(1416/825), HLD 37, TG 190.   Hypertension    Pneumonia     Past Surgical History:  Procedure Laterality Date   ANTERIOR CERVICAL DECOMP/DISCECTOMY FUSION N/A 03/08/2018   Procedure: ANTERIOR CERVICAL DECOMPRESSION/DISCECTOMY FUSION, INTERBODY PROSTHESIS,PLATE/SCREWS CERVICAL FOUR- CERVICAL FIVE, CERVICAL FIVE- CERVICAL SIX, CERVICAL SIX- CERVICAL SEVEN;  Surgeon: Newman Pies, MD;  Location: [facility];  Service: Neurosurgery;  Laterality: N/A;  anterior   CATARACT EXTRACTION     bilat, implants   [place]   POLYPECTOMY  2003    neg 2006, Dr.Medoff   tear duct surgery  1998   for excess tearing, skin graft L foot @ age 9 1/2    Family History  Problem Relation Age of Onset   Heart attack Father 92       due to  asthma flare   Asthma Father    Emphysema Father    Hypertension Mother    Cancer Mother         ? primary   Asthma Son        x2   Allergic rhinitis Neg Hx    Angioedema Neg Hx    Atopy Neg Hx    Eczema Neg Hx    Immunodeficiency Neg Hx    Urticaria Neg Hx     Social History   Occupational History   Occupation: Charity fundraiser   Occupation: PRESIDENT    Employer: [employer]  Tobacco Use   Smoking status: Former    Packs/day: 3.00    Years: 25.00    Pack years: 75.00    Types: Cigarettes    Start date: 06/03/1963    Quit date: 06/03/1983    Years since quitting: 37.5   Smokeless tobacco: Never  Vaping Use   Vaping Use: Never used  Substance and Sexual Activity   Alcohol use: No   Drug use: No   Sexual activity: Yes     Physical Exam: Blood pressure 116/64, pulse 91, temperature 98 F (36.7 C), temperature source Oral, height _0  (1.778 m), weight 205 lb 12.8 oz (93.4 kg), SpO2 96 %.  Gen:      No acute distress ENT:  no nasal polyps, mucus membranes moist Lungs:    No increased respiratory effort, symmetric chest wall excursion, clear to auscultation bilaterally, no wheezes or crackles CV:         Regular rate and rhythm; no murmurs, rubs, or gallops.  No pedal edema   Data Reviewed: Imaging: I have personally reviewed the Snifg test June 2022 shows right sided diphragmatic dysfunction   PFTs:  PFT Results Latest Ref Rng & Units 12/11/2017 09/09/2017  FVC-Pre L 2.85 2.88  FVC-Predicted Pre % 66 67  FVC-Post L 2.90 -  FVC-Predicted Post % 67 -  Pre FEV1/FVC % % 75 74  Post FEV1/FCV % % 75 -  FEV1-Pre L 2.13 2.14  FEV1-Predicted Pre % 69 69  FEV1-Post L 2.18 -  DLCO uncorrected ml/min/mmHg 18.18 18.65  DLCO UNC% % 55 56  DLVA Predicted % 84 82  TLC L 5.36 5.14  TLC % Predicted % 75 71  RV % Predicted % 94 84   I have personally reviewed the patient's PFTs and Spirometry shows an FVC 2.32%   Labs: Anti SCL-70, ANA, aldolase, CK, CCP, DsDNA, RF, all  negative. ESR mildly elevated.   Immunization status: Immunization History  Administered Date(s) Administered   Influenza Split 02/19/2012   Influenza Whole 03/09/2010, 03/17/2011   Influenza, High Dose Seasonal PF 01/18/2015, 03/03/2016, 02/17/2017, 02/22/2018, 01/17/2019   Influenza-Unspecified 03/11/2013, 03/04/2014   PFIZER(Purple Top)SARS-COV-2 Vaccination 06/23/2019, 07/13/2019, 02/20/2020   Pneumococcal Conjugate-13 03/09/2008, 01/23/2016   Pneumococcal Polysaccharide-23 07/08/2012, 09/08/2019   Td 06/02/2006   Tdap 01/23/2016   Zoster Recombinat (Shingrix) 01/17/2019, 05/14/2019   Zoster, Live 06/26/2010    Assessment:  Shortness of breath Right diaphragm paralysis Interstitial Lung Disease - NSIP Chronic Cough Possible Asthma - on anti-TSLP biologic   Plan/Recommendations: Sitting and supine spirometry to evaluate how much of his symptoms are related to the right diaphragm hemiparesis. This probably needs to be done in the hospital pft lab.  Consider thoracic surgery referral for plication. He is interested in at least discussing this Discuss at MDD ILD conference regarding ILD. Possible NSIP? He has raynaud's but his serologic work up this far unremarkable.  Chronic cough may be related to GERD? Discussed lifestyle modifications in addition to pharmacotherapy.   Return to Care: Return in about 4 weeks (around 01/22/2021).   Lenice Llamas, MD [facility] [place]

## 2021-01-01 ENCOUNTER — Encounter: Payer: Self-pay | Admitting: Allergy and Immunology

## 2021-01-01 ENCOUNTER — Telehealth: Payer: Self-pay | Admitting: *Deleted

## 2021-01-01 ENCOUNTER — Other Ambulatory Visit: Payer: Self-pay

## 2021-01-01 ENCOUNTER — Ambulatory Visit (INDEPENDENT_AMBULATORY_CARE_PROVIDER_SITE_OTHER): Payer: Medicare Other | Admitting: Allergy and Immunology

## 2021-01-01 VITALS — BP 124/76 | HR 85 | Temp 98.1°F | Resp 18 | Ht 70.0 in | Wt 207.8 lb

## 2021-01-01 DIAGNOSIS — J4489 Other specified chronic obstructive pulmonary disease: Secondary | ICD-10-CM

## 2021-01-01 DIAGNOSIS — B001 Herpesviral vesicular dermatitis: Secondary | ICD-10-CM

## 2021-01-01 DIAGNOSIS — J3089 Other allergic rhinitis: Secondary | ICD-10-CM | POA: Diagnosis not present

## 2021-01-01 DIAGNOSIS — J449 Chronic obstructive pulmonary disease, unspecified: Secondary | ICD-10-CM

## 2021-01-01 DIAGNOSIS — K219 Gastro-esophageal reflux disease without esophagitis: Secondary | ICD-10-CM

## 2021-01-01 DIAGNOSIS — J324 Chronic pansinusitis: Secondary | ICD-10-CM

## 2021-01-01 DIAGNOSIS — J849 Interstitial pulmonary disease, unspecified: Secondary | ICD-10-CM

## 2021-01-01 DIAGNOSIS — I2510 Atherosclerotic heart disease of native coronary artery without angina pectoris: Secondary | ICD-10-CM

## 2021-01-01 MED ORDER — FAMOTIDINE 20 MG PO TABS: ORAL_TABLET | ORAL | 1 refills | Status: DC

## 2021-01-01 MED ORDER — ALBUTEROL SULFATE HFA 108 (90 BASE) MCG/ACT IN AERS: INHALATION_SPRAY | RESPIRATORY_TRACT | 2 refills | Status: DC

## 2021-01-01 MED ORDER — BREZTRI AEROSPHERE 160-9-4.8 MCG/ACT IN AERO: 2.0000 | INHALATION_SPRAY | Freq: Two times a day (BID) | RESPIRATORY_TRACT | 5 refills | Status: DC

## 2021-01-01 MED ORDER — PANTOPRAZOLE SODIUM 40 MG PO TBEC: DELAYED_RELEASE_TABLET | ORAL | 1 refills | Status: DC

## 2021-01-01 NOTE — Patient Instructions (Addendum)
   1. Continue to Treat inflammation:   A. Breztri - 2 inhalations 2 times per day   B. DISCONTINUE TEZEPELUMAB  2. Continue to Treat reflux / LPR:   A. Pantoprazole 40 mg in the a.m. + famotidine 20 mg in the p.m.  B. Minimize alcohol, caffeine, chocolate  C. Decrease abdominal girth  D. No late, big meals in the evening  3. If needed:   A. ProAir HFA 2 puffs every 4-6 hours  B. nasal saline washes  C. OTC antihistamine  D. Mucinex DM 2 tablets twice a day  E. Duoneb nebulized every 4-6 hours if needed.  F. Nasacort one spray each nostril one time per day  G. Acyclovir 200 - 4 times a day during fever blister outbreak  4. Obtain fall flu vaccine  5. Return to clinic in December 2022 or earlier if problem

## 2021-01-01 NOTE — Telephone Encounter (Signed)
-----   Message from Jiles Prows, MD sent at 01/01/2021 11:41 AM EDT ----- Harless Nakayama for the next 3 months to see what happens

## 2021-01-01 NOTE — Progress Notes (Signed)
[facility]   Follow-up Note  Referring Provider: Janith Lima, MD Primary Provider: Janith Lima, MD Date of Office Visit: 01/01/2021  Subjective:   Edwin [name] (DOB: 10-May-1941) is a 80 y.o. male who returns to the [facility] on 01/01/2021 in re-evaluation of the following:  HPI: Malak returns to this clinic in evaluation of COPD/asthma overlap, ILD, allergic rhinitis, history of chronic sinusitis, history of recurrent fever blisters, and LPR.  His last visit to this clinic with me was 09 Oct 2020.  He has 2 main issues that bother him.  The first is that he has still very significant dyspnea on exertion.  He has had evaluation with Dr. Shearon Stalls who feels that a component of this issue is tied up with right diaphragmatic hemiparalysis and she is having him undergo evaluation for that issue.  The second issue is that he still makes large amounts of sputum production.  He has some cough but it is really sputum production that bothers him.  He still continues to cough and has some throat clearing.  He thinks his reflux is under good control.  He still drinks some alcohol eats some chocolate and drinks some caffeine.  He has had a few fever blister outbreaks requiring the use of Valtrex with successful response.  He continues to use a triple inhaler on a consistent basis.  He does use albuterol a few times a week which does help his symptoms somewhat.  He questions whether or not his anti-TSL P antibody injections are doing anything for him and he is interested in discontinuing this biologic agent.  Allergies as of 01/01/2021       Reactions   Ramipril    REACTION: chest congestion & cough. No angioedema        Medication List    albuterol 108 (90 Base) MCG/ACT inhaler Commonly known as: Ventolin HFA INHALE 1-2 PUFFS INTO THE LUNGS EVERY 4 HOURS AS NEEDED FOR WHEEZING OR SHORTNESS OF BREATH   allopurinol 300  MG tablet Commonly known as: ZYLOPRIM Take 1 tablet (300 mg total) by mouth daily.   aspirin 81 MG chewable tablet Commonly known as: Aspirin Childrens Chew 1 tablet (81 mg total) by mouth daily.   atorvastatin 20 MG tablet Commonly known as: LIPITOR TAKE 1 TABLET BY MOUTH EVERY DAY   Breztri Aerosphere 160-9-4.8 MCG/ACT Aero Generic drug: Budeson-Glycopyrrol-Formoterol Inhale 2 puffs into the lungs in the morning and at bedtime.   brimonidine 0.1 % Soln Commonly known as: ALPHAGAN P Place 1 drop into both eyes 2 (two) times daily. Both eyes   dextromethorphan-guaiFENesin 30-600 MG 12hr tablet Commonly known as: MUCINEX DM Take 1 tablet by mouth 2 (two) times daily as needed for cough.   EPINEPHrine 0.3 mg/0.3 mL Soaj injection Commonly known as: EPI-PEN Inject 0.3 mLs (0.3 mg total) into the muscle once.   famotidine 20 MG tablet Commonly known as: PEPCID TAKE 1 TABLET BY MOUTH EVERYDAY AT BEDTIME   FLUoxetine 10 MG capsule Commonly known as: PROZAC TAKE 1 CAPSULE BY MOUTH EVERY DAY   Lumigan 0.01 % Soln Generic drug: bimatoprost   mometasone 0.1 % ointment Commonly known as: ELOCON Apply topically once daily as needed   olmesartan-hydrochlorothiazide 40-12.5 MG tablet Commonly known as: Benicar HCT Take 1 tablet by mouth daily.   pantoprazole 40 MG tablet Commonly known as: PROTONIX TAKE 1 TABLET BY MOUTH EVERY DAY IN THE MORNING  psyllium 58.6 % powder Commonly known as: METAMUCIL Take 1 packet by mouth daily.   Tezspire 210 MG/1.91ML syringe Generic drug: tezepelumab-ekko Inject 210 mg into the skin every 28 (twenty-eight) days.   VITAMIN B 12 PO Take 1 tablet by mouth daily.     Past Medical History:  Diagnosis Date   Asthma    COPD (chronic obstructive pulmonary disease) ([place])    reactive airway disease   Glaucoma    Gout    Hx of colonic polyps    Dr Earlean Shawl   Hyperlipidemia    NMR lipoprofile 2005: LDL 113(1416/825), HLD 37, TG 190.    Hypertension    Pneumonia     Past Surgical History:  Procedure Laterality Date   ANTERIOR CERVICAL DECOMP/DISCECTOMY FUSION N/A 03/08/2018   Procedure: ANTERIOR CERVICAL DECOMPRESSION/DISCECTOMY FUSION, INTERBODY PROSTHESIS,PLATE/SCREWS CERVICAL FOUR- CERVICAL FIVE, CERVICAL FIVE- CERVICAL SIX, CERVICAL SIX- CERVICAL SEVEN;  Surgeon: Newman Pies, MD;  Location: [facility];  Service: Neurosurgery;  Laterality: N/A;  anterior   CATARACT EXTRACTION     bilat, implants   [place]   POLYPECTOMY  2003    neg 2006, Dr.Medoff   tear duct surgery  1998   for excess tearing, skin graft L foot @ age 64 1/2    Review of systems negative except as noted in HPI / PMHx or noted below:  Review of Systems  Constitutional: Negative.   HENT: Negative.    Eyes: Negative.   Respiratory: Negative.    Cardiovascular: Negative.   Gastrointestinal: Negative.   Genitourinary: Negative.   Musculoskeletal: Negative.   Skin: Negative.   Neurological: Negative.   Endo/Heme/Allergies: Negative.   Psychiatric/Behavioral: Negative.      Objective:   Vitals:   01/01/21 1059  BP: 124/76  Pulse: 85  Resp: 18  Temp: 98.1 F (36.7 C)  SpO2: 96%   Height: '5\' 10"'$  (177.8 cm)  Weight: 207 lb 12.8 oz (94.3 kg)   Physical Exam  Diagnostics:    Spirometry was performed and demonstrated an FEV1 of 1.85 at 63 % of predicted.  The patient had an Asthma Control Test with the following results: ACT Total Score: 22.    Assessment and Plan:   1. COPD with asthma ([place])   2. Interstitial lung disease ([facility])   3. Other allergic rhinitis   4. Chronic pansinusitis   5. LPRD (laryngopharyngeal reflux disease)   6. Herpes labialis    1. Continue to Treat inflammation:   A. Breztri - 2 inhalations 2 times per day   B. DISCONTINUE TEZEPELUMAB  2. Continue to Treat reflux / LPR:   A. Pantoprazole 40 mg in the a.m. + famotidine 20 mg in the p.m.  B. Minimize alcohol,  caffeine, chocolate  C. Decrease abdominal girth  D. No late, big meals in the evening  3. If needed:   A. ProAir HFA 2 puffs every 4-6 hours  B. nasal saline washes  C. OTC antihistamine  D. Mucinex DM 2 tablets twice a day  E. Duoneb nebulized every 4-6 hours if needed.  F. Nasacort one spray each nostril one time per day  G. Acyclovir 200 - 4 times a day during fever blister outbreak  4. Obtain fall flu vaccine  5. Return to clinic in December 2022 or earlier if problem    Quill appears to be stable regarding his respiratory tract issue on a large collection of medical therapy directed against inflammation and his reflux  induced respiratory disease.  He could be a little bit more vigilant about addressing his reflux induced respiratory disease with some of the suggestions noted above which would include minimizing alcohol and caffeine and chocolate and abdominal girth.  I suspect that if he was to lose weight and his intra-abdominal pressure decreased then he may have less impact from his paralyzed right hemidiaphragm regarding his breathing issue.  I am going to see if he does well as we discontinue his anti-TSLP antibody as he is not really sure that it has added anything above and beyond using his triple inhaler.  He will continue to utilize a collection of medical therapy should it be required as noted above.  I will see him back in this clinic in December 2022 or earlier if there is a problem.   Allena Katz, MD Allergy / Immunology [place]

## 2021-01-01 NOTE — Telephone Encounter (Signed)
Called and advised [facility] to put patients Tezspire on hold for three months

## 2021-01-02 ENCOUNTER — Encounter: Payer: Self-pay | Admitting: Allergy and Immunology

## 2021-01-04 ENCOUNTER — Other Ambulatory Visit: Payer: Self-pay | Admitting: Physician Assistant

## 2021-01-04 LAB — SARS CORONAVIRUS 2 (TAT 6-24 HRS): SARS Coronavirus 2: NEGATIVE

## 2021-01-08 ENCOUNTER — Inpatient Hospital Stay (HOSPITAL_COMMUNITY): Admission: RE | Admit: 2021-01-08 | Payer: Medicare Other | Source: Ambulatory Visit

## 2021-01-08 ENCOUNTER — Ambulatory Visit (HOSPITAL_COMMUNITY)
Admission: RE | Admit: 2021-01-08 | Discharge: 2021-01-08 | Disposition: A | Discharge status: Home / Self Care | Payer: Medicare Other | Source: Ambulatory Visit | Attending: Internal Medicine | Admitting: Internal Medicine

## 2021-01-08 DIAGNOSIS — J986 Disorders of diaphragm: Secondary | ICD-10-CM | POA: Diagnosis not present

## 2021-01-08 DIAGNOSIS — R0602 Shortness of breath: Secondary | ICD-10-CM | POA: Diagnosis not present

## 2021-01-08 LAB — PULMONARY FUNCTION TEST
FEF 25-75 Post: 0.57 L/sec
FEF 25-75 Pre: 1.55 L/sec
FEF2575-%Change-Post: -63 %
FEF2575-%Pred-Post: 27 %
FEF2575-%Pred-Pre: 74 %
FEV1-%Change-Post: -17 %
FEV1-%Pred-Post: 57 %
FEV1-%Pred-Pre: 69 %
FEV1-Post: 1.71 L
FEV1-Pre: 2.08 L
FEV1FVC-%Change-Post: -14 %
FEV1FVC-%Pred-Pre: 100 %
FEV6-%Change-Post: -11 %
FEV6-%Pred-Post: 64 %
FEV6-%Pred-Pre: 72 %
FEV6-Post: 2.49 L
FEV6-Pre: 2.82 L
FEV6FVC-%Change-Post: -8 %
FEV6FVC-%Pred-Post: 95 %
FEV6FVC-%Pred-Pre: 104 %
FVC-%Change-Post: -3 %
FVC-%Pred-Post: 66 %
FVC-%Pred-Pre: 69 %
FVC-Post: 2.78 L
FVC-Pre: 2.87 L
Post FEV1/FVC ratio: 62 %
Post FEV6/FVC ratio: 90 %
Pre FEV1/FVC ratio: 72 %
Pre FEV6/FVC Ratio: 98 %

## 2021-01-09 ENCOUNTER — Ambulatory Visit (INDEPENDENT_AMBULATORY_CARE_PROVIDER_SITE_OTHER): Payer: Medicare Other | Admitting: Internal Medicine

## 2021-01-09 DIAGNOSIS — L570 Actinic keratosis: Secondary | ICD-10-CM | POA: Diagnosis not present

## 2021-01-09 DIAGNOSIS — D485 Neoplasm of uncertain behavior of skin: Secondary | ICD-10-CM | POA: Diagnosis not present

## 2021-01-09 DIAGNOSIS — X32XXXD Exposure to sunlight, subsequent encounter: Secondary | ICD-10-CM | POA: Diagnosis not present

## 2021-01-09 DIAGNOSIS — D225 Melanocytic nevi of trunk: Secondary | ICD-10-CM | POA: Diagnosis not present

## 2021-01-16 ENCOUNTER — Ambulatory Visit (INDEPENDENT_AMBULATORY_CARE_PROVIDER_SITE_OTHER): Payer: Self-pay | Admitting: Internal Medicine

## 2021-01-16 ENCOUNTER — Other Ambulatory Visit: Payer: Self-pay

## 2021-01-16 ENCOUNTER — Encounter: Payer: Self-pay | Admitting: Internal Medicine

## 2021-01-16 VITALS — BP 120/70 | HR 94 | Temp 98.2°F | Ht 70.0 in | Wt 209.0 lb

## 2021-01-16 DIAGNOSIS — J986 Disorders of diaphragm: Secondary | ICD-10-CM

## 2021-01-16 DIAGNOSIS — R0602 Shortness of breath: Secondary | ICD-10-CM

## 2021-01-16 NOTE — Progress Notes (Signed)
Edwin [name]    [id number]    [birth year]  Primary Care Physician:Jones, Arvid Right, MD Date of Appointment: 01/16/2021 Established Patient Visit  Chief complaint:   Chief Complaint  Patient presents with   Follow-up    Patient reports about the same.      HPI: Edwin [name] is a 80 y.o. man with shortness of breath, ILD, elevated right diaphgram, asthma.   Interval Updates: Stopped tespire. Hasn't noticed any change in his breathing since stopping this.   ACT:  Asthma Control Test ACT Total Score  01/16/2021 12  01/01/2021 22  06/05/2020 17    I presented his case in multildisciplinary ILD conference and his RLL changes are most likely secondary to atelectasis from diaphragmatic paralysis.   He has completed his sitting and supine spirometry.  Here with his wife today.   I have reviewed the patient's family social and past medical history and updated as appropriate.   Past Medical History:  Diagnosis Date   Asthma    COPD (chronic obstructive pulmonary disease) ([place])    reactive airway disease   Glaucoma    Gout    Hx of colonic polyps    Dr Earlean Shawl   Hyperlipidemia    NMR lipoprofile 2005: LDL 113(1416/825), HLD 37, TG 190.   Hypertension    Pneumonia     Past Surgical History:  Procedure Laterality Date   ANTERIOR CERVICAL DECOMP/DISCECTOMY FUSION N/A 03/08/2018   Procedure: ANTERIOR CERVICAL DECOMPRESSION/DISCECTOMY FUSION, INTERBODY PROSTHESIS,PLATE/SCREWS CERVICAL FOUR- CERVICAL FIVE, CERVICAL FIVE- CERVICAL SIX, CERVICAL SIX- CERVICAL SEVEN;  Surgeon: Newman Pies, MD;  Location: [facility];  Service: Neurosurgery;  Laterality: N/A;  anterior   CATARACT EXTRACTION     bilat, implants   [place]   POLYPECTOMY  2003    neg 2006, Dr.Medoff   tear duct surgery  1998   for excess tearing, skin graft L foot @ age 37 1/2    Family History  Problem Relation Age of Onset   Heart attack Father 21       due  to asthma flare   Asthma Father    Emphysema Father    Hypertension Mother    Cancer Mother         ? primary   Asthma Son        x2   Allergic rhinitis Neg Hx    Angioedema Neg Hx    Atopy Neg Hx    Eczema Neg Hx    Immunodeficiency Neg Hx    Urticaria Neg Hx     Social History   Occupational History   Occupation: Charity fundraiser   Occupation: PRESIDENT    Employer: [employer]  Tobacco Use   Smoking status: Former    Packs/day: 3.00    Years: 25.00    Pack years: 75.00    Types: Cigarettes    Start date: 06/03/1963    Quit date: 06/03/1983    Years since quitting: 37.6   Smokeless tobacco: Never  Vaping Use   Vaping Use: Never used  Substance and Sexual Activity   Alcohol use: No   Drug use: No   Sexual activity: Yes     Physical Exam: Blood pressure 120/70, pulse 94, temperature 98.2 F (36.8 C), temperature source Oral, height '5\' 10"'  (1.778 m), weight 209 lb (94.8 kg), SpO2 94 %.  Gen:      No acute distress ENT:  no nasal polyps, mucus membranes moist Lungs:    No increased respiratory effort, symmetric chest wall excursion, clear to auscultation bilaterally, no wheezes or crackles CV:         Regular rate and rhythm; no murmurs, rubs, or gallops.  No pedal edema   Data Reviewed: Imaging: I have personally reviewed the Snifg test June 2022 shows right sided diphragmatic dysfunction   PFTs:  PFT Results Latest Ref Rng & Units 01/08/2021 12/11/2017 09/09/2017  FVC-Pre L 2.87 2.85 2.88  FVC-Predicted Pre % 69 66 67  FVC-Post L 2.78 2.90 -  FVC-Predicted Post % 66 67 -  Pre FEV1/FVC % % 72 75 74  Post FEV1/FCV % % 62 75 -  FEV1-Pre L 2.08 2.13 2.14  FEV1-Predicted Pre % 69 69 69  FEV1-Post L 1.71 2.18 -  DLCO uncorrected ml/min/mmHg - 18.18 18.65  DLCO UNC% % - 55 56  DLVA Predicted % - 84 82  TLC L - 5.36 5.14  TLC % Predicted % - 75 71  RV % Predicted % - 94 84   I have personally reviewed the patient's PFTs and Spirometry shows an FVC 2.32%  Sitting  and supine spirometry reviewed - greater than 10% change in FVC, FEV1 and TLC.    Labs: Anti SCL-70, ANA, aldolase, CK, CCP, DsDNA, RF, all negative. ESR mildly elevated.   Immunization status: Immunization History  Administered Date(s) Administered   Influenza Split 02/19/2012   Influenza Whole 03/09/2010, 03/17/2011   Influenza, High Dose Seasonal PF 01/18/2015, 03/03/2016, 02/17/2017, 02/22/2018, 01/17/2019   Influenza-Unspecified 03/11/2013, 03/04/2014   PFIZER(Purple Top)SARS-COV-2 Vaccination 06/23/2019, 07/13/2019, 02/20/2020, 06/07/2020   Pneumococcal Conjugate-13 03/09/2008, 01/23/2016   Pneumococcal Polysaccharide-23 07/08/2012, 09/08/2019   Td 06/02/2006   Tdap 01/23/2016   Zoster Recombinat (Shingrix) 01/17/2019, 05/14/2019   Zoster, Live 06/26/2010    Assessment:  Shortness of breath Right diaphragm paralysis Chronic Cough - most likely GERD Possible Asthma  Plan/Recommendations: Sitting and supine spirometry demonstrates a drop in greater than 10% of his FEV1, FVC and TLC. This is consistent with diaphragmatic weakness. We discussed the risks and benefits of plication and currently he feels that this surgery would be too aggressive for him at this time. I agree.   We also discussed the results of his MDD ILD conference - I do not think he has ILD. These changes are related to diaphragmatic paresis and associated atelectasis.   Continue PPI for GERD.  We discussed lifestyle modifications for GERD.   Continue current asthma therapy. I did ask him to consider talking to Dr. Neldon Mc to see if a methacholine challenge would be appropriate. I wonder how much of his symptoms are related to asthma.  We discussed regular exercise with walking daily. He will touch base with me in a few months if his symptoms persist.   I spent 30 minutes in the care of this patient today including pre-charting, chart review, review of results, face-to-face care, coordination of care and  communication with consultants etc.).   Return to Care: Return if symptoms worsen or fail to improve.   Lenice Llamas, MD Pulmonary and [place]

## 2021-01-16 NOTE — Patient Instructions (Addendum)
Please come back and see me as needed for your breathing.   The test to rule out asthma is called a methacholine challenge test.

## 2021-02-11 NOTE — Progress Notes (Signed)
Interstitial Lung Disease Multidisciplinary Conference   Edwin [name]    MRN [id number]    DOB December 18, 1940  Primary Care Physician:Jones, Arvid Right, MD  Referring Physician: Dr Lenice Llamas  Time of Conference: 7.30am- 8.30am Date of conference: 01/09/21 Location of Conference: -  Virtual  Participating Pulmonary: Dr. Brand Males, MD - yes,  Dr Marshell Garfinkel, MD - yes. Dr Lenice Llamas - yes Pathology: Dr Jaquita Folds, MD - no , Dr Enid Cutter - no Radiology: Dr Salvatore Marvel MD - no, Dr Vinnie Langton MD - yes,  Dr Lorin Picket, MD - no , Dr Eddie Candle - no Others: no  Brief History: Has ILD but also has diaphragmatic paralysis which could be contributing. I am obtaining sitting and supine spirometry, but wanted to discuss his ILD pattern in the setting of raynauds. Could this be IPAF? Blood work done so far only shows mildly elevated ESR. Has done ILD packet.  Serology: Results for VANE, YAPP (MRN [id number]) as of 02/11/2021 11:19  Ref. Range 11/12/2020 12:23  Anti Nuclear Antibody (ANA) Latest Ref Range: NEGATIVE  NEGATIVE  Cyclic Citrullin Peptide Ab Latest Units: UNITS <16  ds DNA Ab Latest Units: IU/mL <1  ENA RNP Ab Latest Ref Range: 0.0 - 0.9 AI <0.2  RA Latex Turbid. Latest Ref Range: <14 IU/mL <14  Scleroderma (Scl-70) (ENA) Antibody, IgG Latest Ref Range: <1.0 NEG AI <1.0 NEG    MDD discussion of CT scan    - Date or time period of scan: Jan 2022 HRCT andprior  - What is the final conclusion per 2018 ATS/Fleischner Criteria - all films have elevated right diaphragm. IN that right base there is scar. All related to right hemi. In LLL there is some findings but is non specific (not even ILA).   Pathology discussion of biopsy none  PFTs:  PFT Results Latest Ref Rng & Units 01/08/2021 12/11/2017 09/09/2017  FVC-Pre L 2.87 2.85 2.88  FVC-Predicted Pre % 69 66 67  FVC-Post L 2.78 2.90 -  FVC-Predicted Post % 66 67 -  Pre FEV1/FVC % % 72 75 74  Post  FEV1/FCV % % 62 75 -  FEV1-Pre L 2.08 2.13 2.14  FEV1-Predicted Pre % 69 69 69  FEV1-Post L 1.71 2.18 -  DLCO uncorrected ml/min/mmHg - 18.18 18.65  DLCO UNC% % - 55 56  DLVA Predicted % - 84 82  TLC L - 5.36 5.14  TLC % Predicted % - 75 71  RV % Predicted % - 94 84     Labs: x*  MDD Impression/Recs: DX: is RIGHT DIAPH  paralysis. Rx control GERD and other measures directed by clinician   Time Spent in preparation and discussion:  > 30 min    SIGNATURE   Dr. Brand Males, M.D., F.C.C.P,  Pulmonary and Critical Care Medicine Staff Physician, [place] Director - Interstitial Lung Disease  Program  Pulmonary [place] [facility] [place], [place], [address]  Pager: [phone], If no answer or between  15:00h - 7:00h: call [phone] Telephone: [phone]  11:19 AM 02/11/2021 ...................................................................................................................[name] Kitchen References: Diagnosis of Hypersensitivity Pneumonitis in Adults. An Official ATS/JRS/ALAT Clinical Practice Guideline. Ragu G et al, Miguel Barrera Aug 1;202(3):e36-e69.       Diagnosis of Idiopathic Pulmonary Fibrosis. An Official ATS/ERS/JRS/ALAT Clinical Practice Guideline. Raghu G et al, [place]. 2018 Sep 1;198(5):e44-e68.  IPF Suspected   Histopath ology Pattern      UIP  Probable UIP  Indeterminate for  UIP  Alternative  diagnosis    UIP  IPF  IPF  IPF  Non-IPF dx   HRCT   Probabe UIP  IPF  IPF  IPF (Likely)**  Non-IPF dx  Pattern  Indeterminate for UIP  IPF  IPF (Likely)**  Indeterminate  for IPF**  Non-IPF dx    Alternative diagnosis  IPF (Likely)**/ non-IPF dx  Non-IPF dx  Non-IPF dx  Non-IPF dx     Idiopathic pulmonary fibrosis diagnosis based upon HRCT and Biopsy paterns.  ** IPF is the likely diagnosis when any of following features are  present:  Moderate-to-severe traction bronchiectasis/bronchiolectasis (defined as mild traction bronchiectasis/bronchiolectasis in four or more lobes including the lingual as a lobe, or moderate to severe traction bronchiectasis in two or more lobes) in a man over age 98 years or in a woman over age 70 years Extensive (>30%) reticulation on HRCT and an age >70 years  Increased neutrophils and/or absence of lymphocytosis in BAL fluid  Multidisciplinary discussion reaches a confident diagnosis of IPF.   **Indeterminate for IPF  Without an adequate biopsy is unlikely to be IPF  With an adequate biopsy may be reclassified to a more specific diagnosis after multidisciplinary discussion and/or additional consultation.   dx = diagnosis; HRCT = high-resolution computed tomography; IPF = idiopathic pulmonary fibrosis; UIP = usual interstitial pneumonia.

## 2021-02-12 DIAGNOSIS — D485 Neoplasm of uncertain behavior of skin: Secondary | ICD-10-CM | POA: Diagnosis not present

## 2021-02-12 DIAGNOSIS — X32XXXD Exposure to sunlight, subsequent encounter: Secondary | ICD-10-CM | POA: Diagnosis not present

## 2021-02-12 DIAGNOSIS — D225 Melanocytic nevi of trunk: Secondary | ICD-10-CM | POA: Diagnosis not present

## 2021-02-12 DIAGNOSIS — L570 Actinic keratosis: Secondary | ICD-10-CM | POA: Diagnosis not present

## 2021-02-12 DIAGNOSIS — Z1283 Encounter for screening for malignant neoplasm of skin: Secondary | ICD-10-CM | POA: Diagnosis not present

## 2021-02-19 DIAGNOSIS — Z23 Encounter for immunization: Secondary | ICD-10-CM | POA: Diagnosis not present

## 2021-03-10 ENCOUNTER — Other Ambulatory Visit: Payer: Self-pay | Admitting: Internal Medicine

## 2021-03-10 DIAGNOSIS — F3342 Major depressive disorder, recurrent, in full remission: Secondary | ICD-10-CM

## 2021-03-10 DIAGNOSIS — I10 Essential (primary) hypertension: Secondary | ICD-10-CM

## 2021-03-10 DIAGNOSIS — M1000 Idiopathic gout, unspecified site: Secondary | ICD-10-CM

## 2021-03-19 ENCOUNTER — Ambulatory Visit (INDEPENDENT_AMBULATORY_CARE_PROVIDER_SITE_OTHER): Payer: Medicare Other | Admitting: Allergy and Immunology

## 2021-03-19 ENCOUNTER — Other Ambulatory Visit: Payer: Self-pay

## 2021-03-19 VITALS — BP 138/72 | HR 83 | Temp 98.2°F | Resp 16 | Ht 70.0 in | Wt 211.8 lb

## 2021-03-19 DIAGNOSIS — J45901 Unspecified asthma with (acute) exacerbation: Secondary | ICD-10-CM

## 2021-03-19 DIAGNOSIS — I2510 Atherosclerotic heart disease of native coronary artery without angina pectoris: Secondary | ICD-10-CM

## 2021-03-19 DIAGNOSIS — J441 Chronic obstructive pulmonary disease with (acute) exacerbation: Secondary | ICD-10-CM | POA: Diagnosis not present

## 2021-03-19 DIAGNOSIS — J3089 Other allergic rhinitis: Secondary | ICD-10-CM | POA: Diagnosis not present

## 2021-03-19 DIAGNOSIS — J324 Chronic pansinusitis: Secondary | ICD-10-CM | POA: Diagnosis not present

## 2021-03-19 DIAGNOSIS — J449 Chronic obstructive pulmonary disease, unspecified: Secondary | ICD-10-CM

## 2021-03-19 DIAGNOSIS — K219 Gastro-esophageal reflux disease without esophagitis: Secondary | ICD-10-CM | POA: Diagnosis not present

## 2021-03-19 DIAGNOSIS — J849 Interstitial pulmonary disease, unspecified: Secondary | ICD-10-CM | POA: Diagnosis not present

## 2021-03-19 DIAGNOSIS — B001 Herpesviral vesicular dermatitis: Secondary | ICD-10-CM

## 2021-03-19 MED ORDER — METHYLPREDNISOLONE ACETATE 80 MG/ML IJ SUSP
80.0000 mg | Freq: Once | INTRAMUSCULAR | Status: AC
  Administered 2021-03-19: 80 mg via INTRAMUSCULAR

## 2021-03-19 MED ORDER — AZITHROMYCIN 500 MG PO TABS: 500.0000 mg | ORAL_TABLET | Freq: Every day | ORAL | 0 refills | Status: AC

## 2021-03-19 MED ORDER — MOMETASONE FUROATE 0.1 % EX OINT: TOPICAL_OINTMENT | CUTANEOUS | 1 refills | Status: AC

## 2021-03-19 MED ORDER — EPINEPHRINE 0.3 MG/0.3ML IJ SOAJ: 0.3000 mg | Freq: Once | INTRAMUSCULAR | 1 refills | Status: AC

## 2021-03-19 MED ORDER — PANTOPRAZOLE SODIUM 40 MG PO TBEC: DELAYED_RELEASE_TABLET | ORAL | 1 refills | Status: DC

## 2021-03-19 MED ORDER — FAMOTIDINE 20 MG PO TABS
ORAL_TABLET | ORAL | 1 refills | Status: DC
Start: 2021-03-19 — End: 2021-08-27

## 2021-03-19 NOTE — Progress Notes (Signed)
[facility]   Follow-up Note  Referring Provider: Janith Lima, MD Primary Provider: Janith Lima, MD Date of Office Visit: 03/19/2021  Subjective:   Edwin [name] (DOB: 1940-09-11) is a 80 y.o. male who returns to the [facility] on 03/19/2021 in re-evaluation of the following:  HPI: Kamare returns to this clinic in evaluation of COPD/asthma overlap, component of interstitial lung disease, right hemiparalysis of diaphragm, allergic rhinitis and history of chronic sinusitis, history of recurrent fever blisters, and reflux induced respiratory disease.  His last visit to this clinic with me was 01 January 2021.  Overall he has done relatively well until this past weekend.  On Saturday he developed nasal congestion and coughing and he has had yellow-green sputum production and some shortness of breath.  He has not had any constitutional symptoms and has not had any fever.  He was at a group meeting in [place] where he met many people with close personal contact.  He has received 5 COVID vaccines with his most recent vaccine approximately 2 and half weeks ago along with his flu vaccine.   Allergies as of 03/19/2021       Reactions   Ramipril Other (See Comments)   REACTION: chest congestion & cough. No angioedema        Medication List    albuterol 108 (90 Base) MCG/ACT inhaler Commonly known as: Ventolin HFA INHALE 1-2 PUFFS INTO THE LUNGS EVERY 4 HOURS AS NEEDED FOR WHEEZING OR SHORTNESS OF BREATH   allopurinol 300 MG tablet Commonly known as: ZYLOPRIM Take 1 tablet (300 mg total) by mouth daily.   aspirin 81 MG chewable tablet Commonly known as: Aspirin Childrens Chew 1 tablet (81 mg total) by mouth daily.   atorvastatin 20 MG tablet Commonly known as: LIPITOR TAKE 1 TABLET BY MOUTH EVERY DAY   Breztri Aerosphere 160-9-4.8 MCG/ACT Aero Generic drug: Budeson-Glycopyrrol-Formoterol Inhale 2 puffs  into the lungs in the morning and at bedtime.   brimonidine 0.1 % Soln Commonly known as: ALPHAGAN P Place 1 drop into both eyes 2 (two) times daily. Both eyes   dextromethorphan-guaiFENesin 30-600 MG 12hr tablet Commonly known as: MUCINEX DM Take 1 tablet by mouth 2 (two) times daily as needed for cough.   EPINEPHrine 0.3 mg/0.3 mL Soaj injection Commonly known as: EPI-PEN Inject 0.3 mLs (0.3 mg total) into the muscle once.   famotidine 20 MG tablet Commonly known as: PEPCID TAKE 1 TABLET BY MOUTH EVERYDAY AT BEDTIME   FLUoxetine 10 MG capsule Commonly known as: PROZAC TAKE 1 CAPSULE BY MOUTH EVERY DAY   Lumigan 0.01 % Soln Generic drug: bimatoprost   mometasone 0.1 % ointment Commonly known as: ELOCON Apply topically once daily as needed   olmesartan-hydrochlorothiazide 40-12.5 MG tablet Commonly known as: Benicar HCT Take 1 tablet by mouth daily.   pantoprazole 40 MG tablet Commonly known as: PROTONIX TAKE 1 TABLET BY MOUTH EVERY DAY IN THE MORNING   psyllium 58.6 % powder Commonly known as: METAMUCIL Take 1 packet by mouth daily.   VITAMIN B 12 PO Take 1 tablet by mouth daily.    Past Medical History:  Diagnosis Date   Asthma    COPD (chronic obstructive pulmonary disease) ([facility])    reactive airway disease   Glaucoma    Gout    Hx of colonic polyps    Dr Earlean Shawl   Hyperlipidemia    NMR lipoprofile 2005: LDL 929(2446/286),  HLD 37, TG 190.   Hypertension    Pneumonia     Past Surgical History:  Procedure Laterality Date   ANTERIOR CERVICAL DECOMP/DISCECTOMY FUSION N/A 03/08/2018   Procedure: ANTERIOR CERVICAL DECOMPRESSION/DISCECTOMY FUSION, INTERBODY PROSTHESIS,PLATE/SCREWS CERVICAL FOUR- CERVICAL FIVE, CERVICAL FIVE- CERVICAL SIX, CERVICAL SIX- CERVICAL SEVEN;  Surgeon: Newman Pies, MD;  Location: [facility];  Service: Neurosurgery;  Laterality: N/A;  anterior   CATARACT EXTRACTION     bilat, implants   [place]   POLYPECTOMY  2003    neg 2006, Dr.Medoff   tear duct surgery  1998   for excess tearing, skin graft L foot @ age 24 1/2    Review of systems negative except as noted in HPI / PMHx or noted below:  Review of Systems  Constitutional: Negative.   HENT: Negative.    Eyes: Negative.   Respiratory: Negative.    Cardiovascular: Negative.   Gastrointestinal: Negative.   Genitourinary: Negative.   Musculoskeletal: Negative.   Skin: Negative.   Neurological: Negative.   Endo/Heme/Allergies: Negative.   Psychiatric/Behavioral: Negative.      Objective:   Vitals:   03/19/21 0844  BP: 138/72  Pulse: 83  Resp: 16  Temp: 98.2 F (36.8 C)  SpO2: 95%   Height: '5\' 10"'  (177.8 cm)  Weight: 211 lb 12.8 oz (96.1 kg)   Physical Exam Constitutional:      Appearance: He is not diaphoretic.  HENT:     Head: Normocephalic.     Right Ear: Tympanic membrane, ear canal and external ear normal.     Left Ear: Tympanic membrane, ear canal and external ear normal.     Nose: Nose normal. No mucosal edema or rhinorrhea.     Mouth/Throat:     Pharynx: Uvula midline. No oropharyngeal exudate.  Eyes:     Conjunctiva/sclera: Conjunctivae normal.  Neck:     Thyroid: No thyromegaly.     Trachea: Trachea normal. No tracheal tenderness or tracheal deviation.  Cardiovascular:     Rate and Rhythm: Normal rate and regular rhythm.     Heart sounds: Normal heart sounds, S1 normal and S2 normal. No murmur heard. Pulmonary:     Effort: No respiratory distress.     Breath sounds: No stridor. Wheezing (Bilateral inspiratory and expiratory wheezing all lung [place]) present. No rales.  Lymphadenopathy:     Head:     Right side of head: No tonsillar adenopathy.     Left side of head: No tonsillar adenopathy.     Cervical: No cervical adenopathy.  Skin:    Findings: No erythema or rash.     Nails: There is no clubbing.  Neurological:     Mental Status: He is alert.    Diagnostics:    Spirometry  was performed and demonstrated an FEV1 of 1.74 at 60 % of predicted.  The patient had an Asthma Control Test with the following results: ACT Total Score: 13.    BIANAX NOW Covid swab negative  Assessment and Plan:   1. Asthma with COPD with exacerbation ([place])   2. Interstitial lung disease ([facility])   3. Other allergic rhinitis   4. Chronic pansinusitis   5. LPRD (laryngopharyngeal reflux disease)   6. Herpes labialis     1. Continue to Treat inflammation:   A. Breztri - 2 inhalations 2 times per day   2. Continue to Treat reflux / LPR:   A. Pantoprazole 40 mg in the a.m. +  famotidine 20 mg in the p.m.  B. Minimize alcohol, caffeine, chocolate  C. Decrease abdominal girth  D. No late, big meals in the evening  3. If needed:   A. ProAir HFA 2 puffs every 4-6 hours  B. nasal saline washes  C. OTC antihistamine  D. Mucinex DM 2 tablets twice a day  E. Duoneb nebulized every 4-6 hours if needed.  F. Nasacort one spray each nostril one time per day  G. Acyclovir 200 - 4 times a day during fever blister outbreak  4. For this recent event:   A.  Depo-Medrol 80 IM delivered in clinic today  B.  Prednisone 20 mg delivered in clinic today  C.  Azithromycin 500 mg -1 tablet 1 time per day for 3 days only  5. Return to clinic in January 2023 or earlier if problem    Julia will use a systemic steroid and a broad-spectrum antibiotic to treat what appears to be a respiratory tract infection.  Although this may be viral in etiology we also need to cover the possibility of mycoplasma especially given his previous lung disease condition.  Assuming he does well with this plan he will continue to use a triple inhaler and therapy directed against reflux and we will see him back in this clinic in January 2023 or earlier if there is a problem.   Allena Katz, MD Allergy / Immunology [facility]

## 2021-03-19 NOTE — Patient Instructions (Addendum)
   1. Continue to Treat inflammation:   A. Breztri - 2 inhalations 2 times per day   2. Continue to Treat reflux / LPR:   A. Pantoprazole 40 mg in the a.m. + famotidine 20 mg in the p.m.  B. Minimize alcohol, caffeine, chocolate  C. Decrease abdominal girth  D. No late, big meals in the evening  3. If needed:   A. ProAir HFA 2 puffs every 4-6 hours  B. nasal saline washes  C. OTC antihistamine  D. Mucinex DM 2 tablets twice a day  E. Duoneb nebulized every 4-6 hours if needed.  F. Nasacort one spray each nostril one time per day  G. Acyclovir 200 - 4 times a day during fever blister outbreak  4. For this recent event:   A.  Depo-Medrol 80 IM delivered in clinic today  B.  Prednisone 20 mg delivered in clinic today  C.  Azithromycin 500 mg -1 tablet 1 time per day for 3 days only  5. Return to clinic in January 2023 or earlier if problem

## 2021-03-20 ENCOUNTER — Telehealth: Payer: Self-pay | Admitting: *Deleted

## 2021-03-20 ENCOUNTER — Encounter: Payer: Self-pay | Admitting: Allergy and Immunology

## 2021-03-20 NOTE — Telephone Encounter (Signed)
-----   Message from Jiles Prows, MD sent at 03/20/2021  6:55 AM EDT ----- Please contact Zeddie to inquire how he is doing today with his treatment established yesterday.

## 2021-03-20 NOTE — Telephone Encounter (Signed)
Called and spoke with the patient and he stated that he is feeling much better today compared to yesterday. He states that the prednisone and antibiotics must be really kicking in and the Depo injection has really helped.

## 2021-03-26 ENCOUNTER — Telehealth: Payer: Self-pay | Admitting: Allergy and Immunology

## 2021-03-26 MED ORDER — PREDNISONE 10 MG PO TABS: 10.0000 mg | ORAL_TABLET | Freq: Every day | ORAL | 0 refills | Status: AC

## 2021-03-26 MED ORDER — AZITHROMYCIN 500 MG PO TABS: 500.0000 mg | ORAL_TABLET | Freq: Every day | ORAL | 0 refills | Status: AC

## 2021-03-26 NOTE — Telephone Encounter (Signed)
Please inform Edwin [name], that we will have him use another 3 days of azithromycin at 500 mg once a day for 3 days, and start prednisone at 10 mg a day for the next 10 days.

## 2021-03-26 NOTE — Telephone Encounter (Signed)
Spoke with patient, informed him of Dr. Bruna Potter recommendation. Patient verbalized understanding. Patient will call if he has any problems.

## 2021-03-26 NOTE — Telephone Encounter (Signed)
Pt would like Dr. Neldon Mc to know he is better than he was last Tuesday, however he still has lots of congestion and is coughing up green phlegm.   Best contact number: [phone]

## 2021-04-02 ENCOUNTER — Other Ambulatory Visit: Payer: Self-pay | Admitting: Internal Medicine

## 2021-04-02 DIAGNOSIS — M1000 Idiopathic gout, unspecified site: Secondary | ICD-10-CM

## 2021-04-17 ENCOUNTER — Telehealth: Payer: Self-pay | Admitting: Internal Medicine

## 2021-04-17 ENCOUNTER — Other Ambulatory Visit: Payer: Self-pay | Admitting: Internal Medicine

## 2021-04-17 DIAGNOSIS — I10 Essential (primary) hypertension: Secondary | ICD-10-CM

## 2021-04-17 MED ORDER — OLMESARTAN MEDOXOMIL-HCTZ 40-12.5 MG PO TABS: 1.0000 | ORAL_TABLET | Freq: Every day | ORAL | 0 refills | Status: DC

## 2021-04-17 NOTE — Telephone Encounter (Signed)
Patient requesting short fill until med refill ov  1st available 12/20 @ 8:40am

## 2021-05-12 ENCOUNTER — Other Ambulatory Visit: Payer: Self-pay | Admitting: Internal Medicine

## 2021-05-12 DIAGNOSIS — F3342 Major depressive disorder, recurrent, in full remission: Secondary | ICD-10-CM

## 2021-05-21 ENCOUNTER — Other Ambulatory Visit: Payer: Self-pay

## 2021-05-21 ENCOUNTER — Ambulatory Visit (INDEPENDENT_AMBULATORY_CARE_PROVIDER_SITE_OTHER): Payer: Medicare Other

## 2021-05-21 ENCOUNTER — Encounter: Payer: Self-pay | Admitting: Internal Medicine

## 2021-05-21 ENCOUNTER — Ambulatory Visit (INDEPENDENT_AMBULATORY_CARE_PROVIDER_SITE_OTHER): Payer: Medicare Other | Admitting: Internal Medicine

## 2021-05-21 VITALS — BP 124/72 | HR 87 | Temp 98.4°F | Ht 70.0 in | Wt 212.0 lb

## 2021-05-21 DIAGNOSIS — N1831 Chronic kidney disease, stage 3a: Secondary | ICD-10-CM | POA: Diagnosis not present

## 2021-05-21 DIAGNOSIS — I2510 Atherosclerotic heart disease of native coronary artery without angina pectoris: Secondary | ICD-10-CM | POA: Diagnosis not present

## 2021-05-21 DIAGNOSIS — R058 Other specified cough: Secondary | ICD-10-CM | POA: Insufficient documentation

## 2021-05-21 DIAGNOSIS — E118 Type 2 diabetes mellitus with unspecified complications: Secondary | ICD-10-CM

## 2021-05-21 DIAGNOSIS — R059 Cough, unspecified: Secondary | ICD-10-CM | POA: Diagnosis not present

## 2021-05-21 DIAGNOSIS — E785 Hyperlipidemia, unspecified: Secondary | ICD-10-CM | POA: Diagnosis not present

## 2021-05-21 DIAGNOSIS — I10 Essential (primary) hypertension: Secondary | ICD-10-CM | POA: Diagnosis not present

## 2021-05-21 LAB — BASIC METABOLIC PANEL
BUN: 19 mg/dL (ref 6–23)
CO2: 32 mEq/L (ref 19–32)
Calcium: 9.6 mg/dL (ref 8.4–10.5)
Chloride: 100 mEq/L (ref 96–112)
Creatinine, Ser: 1.39 mg/dL (ref 0.40–1.50)
GFR: 48.11 mL/min — ABNORMAL LOW (ref >60.00)
Glucose, Bld: 113 mg/dL — ABNORMAL HIGH (ref 70–99)
Potassium: 4.2 mEq/L (ref 3.5–5.1)
Sodium: 139 mEq/L (ref 135–145)

## 2021-05-21 LAB — CBC WITH DIFFERENTIAL/PLATELET
Basophils Absolute: 0 10*3/uL (ref 0.0–0.1)
Basophils Relative: 0.5 % (ref 0.0–3.0)
Eosinophils Absolute: 0.1 10*3/uL (ref 0.0–0.7)
Eosinophils Relative: 0.9 % (ref 0.0–5.0)
HCT: 40.9 % (ref 39.0–52.0)
Hemoglobin: 13.6 g/dL (ref 13.0–17.0)
Lymphocytes Relative: 35.7 % (ref 12.0–46.0)
Lymphs Abs: 2.7 10*3/uL (ref 0.7–4.0)
MCHC: 33.2 g/dL (ref 30.0–36.0)
MCV: 98.6 fl (ref 78.0–100.0)
Monocytes Absolute: 0.8 10*3/uL (ref 0.1–1.0)
Monocytes Relative: 10 % (ref 3.0–12.0)
Neutro Abs: 4 10*3/uL (ref 1.4–7.7)
Neutrophils Relative %: 52.9 % (ref 43.0–77.0)
Platelets: 155 10*3/uL (ref 150.0–400.0)
RBC: 4.15 Mil/uL — ABNORMAL LOW (ref 4.22–5.81)
RDW: 16.4 % — ABNORMAL HIGH (ref 11.5–15.5)
WBC: 7.5 10*3/uL (ref 4.0–10.5)

## 2021-05-21 LAB — HEMOGLOBIN A1C: Hgb A1c MFr Bld: 5.8 % (ref 4.6–6.5)

## 2021-05-21 MED ORDER — OLMESARTAN MEDOXOMIL 40 MG PO TABS: 40.0000 mg | ORAL_TABLET | Freq: Every day | ORAL | 1 refills | Status: DC

## 2021-05-21 MED ORDER — ATORVASTATIN CALCIUM 20 MG PO TABS: 20.0000 mg | ORAL_TABLET | Freq: Every day | ORAL | 1 refills | Status: DC

## 2021-05-21 NOTE — Progress Notes (Signed)
Subjective:  Patient ID: Edwin [name], male    DOB: 03-03-1941  Age: 80 y.o. MRN: [id number]  CC: COPD, Cough, Hypertension, and Diabetes  This visit occurred during the SARS-CoV-2 public health emergency.  Safety protocols were in place, including screening questions prior to the visit, additional usage of staff PPE, and extensive cleaning of exam room while observing appropriate contact time as indicated for disinfecting solutions.    HPI Edwin [name] presents for f/up -  He had a COPD exacerbation a few weeks ago and was seen by pulmonary and has been taking prednisone and Zithromax.  He tells me his cough is per productive of scant amount of yellow phlegm but is improving.  He has unchanged shortness of breath.  He denies chest pain, hemoptysis, fever, or chills.  Outpatient Medications Prior to Visit  Medication Sig Dispense Refill   albuterol (VENTOLIN HFA) 108 (90 Base) MCG/ACT inhaler INHALE 1-2 PUFFS INTO THE LUNGS EVERY 4 HOURS AS NEEDED FOR WHEEZING OR SHORTNESS OF BREATH 18 g 2   allopurinol (ZYLOPRIM) 300 MG tablet TAKE 1 TABLET BY MOUTH EVERY DAY 90 tablet 1   aspirin (ASPIRIN CHILDRENS) 81 MG chewable tablet Chew 1 tablet (81 mg total) by mouth daily.     brimonidine (ALPHAGAN P) 0.1 % SOLN Place 1 drop into both eyes 2 (two) times daily. Both eyes     Budeson-Glycopyrrol-Formoterol (BREZTRI AEROSPHERE) 160-9-4.8 MCG/ACT AERO Inhale 2 puffs into the lungs in the morning and at bedtime. 10.7 g 5   Cyanocobalamin (VITAMIN B 12 PO) Take 1 tablet by mouth daily.     dextromethorphan-guaiFENesin (MUCINEX DM) 30-600 MG 12hr tablet Take 1 tablet by mouth 2 (two) times daily as needed for cough.      famotidine (PEPCID) 20 MG tablet TAKE 1 TABLET BY MOUTH EVERYDAY AT BEDTIME 90 tablet 1   FLUoxetine (PROZAC) 10 MG capsule TAKE 1 CAPSULE BY MOUTH EVERY DAY 90 capsule 1   LUMIGAN 0.01 % SOLN      mometasone (ELOCON) 0.1 % ointment Apply topically once daily as needed 45 g 1    pantoprazole (PROTONIX) 40 MG tablet TAKE 1 TABLET BY MOUTH EVERY DAY IN THE MORNING 90 tablet 1   psyllium (METAMUCIL) 58.6 % powder Take 1 packet by mouth daily.     atorvastatin (LIPITOR) 20 MG tablet TAKE 1 TABLET BY MOUTH EVERY DAY 90 tablet 1   olmesartan-hydrochlorothiazide (BENICAR HCT) 40-12.5 MG tablet Take 1 tablet by mouth daily. 90 tablet 0   No facility-administered medications prior to visit.    ROS Review of Systems  Constitutional:  Negative for chills, diaphoresis, fatigue and fever.  HENT: Negative.  Negative for trouble swallowing.   Respiratory:  Positive for cough, shortness of breath and wheezing. Negative for chest tightness.   Cardiovascular:  Negative for chest pain, palpitations and leg swelling.  Gastrointestinal:  Negative for abdominal pain, diarrhea, nausea and vomiting.  Genitourinary: Negative.   Musculoskeletal: Negative.  Negative for arthralgias and myalgias.  Skin: Negative.   Neurological:  Negative for dizziness, weakness and light-headedness.  Hematological:  Negative for adenopathy. Does not bruise/bleed easily.  Psychiatric/Behavioral: Negative.     Objective:  BP 124/72 (BP Location: Left Arm, Patient Position: Sitting, Cuff Size: Large)    Pulse 87    Temp 98.4 F (36.9 C) (Oral)    Ht 5\' 10"  (1.778 m)    Wt 212 lb (96.2 kg)    SpO2 95%    BMI 30.42  kg/m   BP Readings from Last 3 Encounters:  05/21/21 124/72  03/19/21 138/72  01/16/21 120/70    Wt Readings from Last 3 Encounters:  05/21/21 212 lb (96.2 kg)  03/19/21 211 lb 12.8 oz (96.1 kg)  01/16/21 209 lb (94.8 kg)    Physical Exam Vitals reviewed.  Constitutional:      General: He is not in acute distress.    Appearance: He is not ill-appearing, toxic-appearing or diaphoretic.  HENT:     Nose: Nose normal.     Mouth/Throat:     Mouth: Mucous membranes are moist.  Eyes:     General: No scleral icterus.    Conjunctiva/sclera: Conjunctivae normal.  Cardiovascular:      Rate and Rhythm: Normal rate and regular rhythm.     Heart sounds: No murmur heard. Pulmonary:     Effort: No tachypnea, accessory muscle usage or respiratory distress.     Breath sounds: Examination of the right-upper field reveals rhonchi. Examination of the left-upper field reveals rhonchi. Examination of the right-middle field reveals rhonchi. Examination of the left-middle field reveals rhonchi. Examination of the right-lower field reveals wheezing and rhonchi. Examination of the left-lower field reveals wheezing, rhonchi and rales. Wheezing, rhonchi and rales present. No decreased breath sounds.     Comments: Diffuse exp rhonchi Abdominal:     General: Abdomen is flat.     Palpations: There is no mass.     Tenderness: There is no abdominal tenderness. There is no guarding.     Hernia: No hernia is present.  Musculoskeletal:        General: Normal range of motion.     Cervical back: Neck supple.     Right lower leg: No edema.     Left lower leg: No edema.  Lymphadenopathy:     Cervical: No cervical adenopathy.  Skin:    General: Skin is warm and dry.  Neurological:     General: No focal deficit present.     Mental Status: He is alert and oriented to person, place, and time.    Lab Results  Component Value Date   WBC 7.5 05/21/2021   HGB 13.6 05/21/2021   HCT 40.9 05/21/2021   PLT 155.0 05/21/2021   GLUCOSE 113 (H) 05/21/2021   CHOL 192 10/01/2020   TRIG 284.0 (H) 10/01/2020   HDL 70.90 10/01/2020   LDLDIRECT 79.0 10/01/2020   LDLCALC 43 09/08/2019   ALT 36 10/01/2020   AST 28 10/01/2020   NA 139 05/21/2021   K 4.2 05/21/2021   CL 100 05/21/2021   CREATININE 1.39 05/21/2021   BUN 19 05/21/2021   CO2 32 05/21/2021   TSH 1.33 10/01/2020   PSA 0.87 05/05/2016   HGBA1C 5.8 05/21/2021    DG Chest 2 View  Result Date: 05/21/2021 CLINICAL DATA:  Cough.  Chronic intermittent productive cough. EXAM: CHEST - 2 VIEW COMPARISON:  Radiograph 10/01/2020. High-resolution  chest CT 06/22/2020 FINDINGS: Unchanged elevation of right hemidiaphragm. Stable heart size and mediastinal contours. No acute airspace disease. No pleural fluid or pneumothorax. Subpleural reticular changes in the right greater than left lung base, also seen on prior chest CT. No acute osseous abnormalities are seen. IMPRESSION: 1. No acute findings. 2. Unchanged elevation of right hemidiaphragm. 3. Subpleural reticular changes in the right greater than left lung base, also seen on prior high-resolution chest CT, possibly related to chronic interstitial lung disease. Electronically Signed   By: Keith Rake M.D.   On: 05/21/2021 12:03  Assessment & Plan:   Murle was seen today for copd, cough, hypertension and diabetes.  Diagnoses and all orders for this visit:  Type II diabetes mellitus with manifestations ([place])- His blood sugar is very well controlled. -     Basic metabolic panel; Future -     Hemoglobin A1c; Future -     Hemoglobin A1c -     Basic metabolic panel -     olmesartan (BENICAR) 40 MG tablet; Take 1 tablet (40 mg total) by mouth daily.  Hyperlipidemia with target LDL less than 130- LDL goal achieved. Doing well on the statin  -     atorvastatin (LIPITOR) 20 MG tablet; Take 1 tablet (20 mg total) by mouth daily.  Cough productive of purulent sputum- Based on his symptoms, exam, and chest x-ray I do not think another round of antibiotics is indicated. -     CBC with Differential/Platelet; Future -     DG Chest 2 View; Future -     CBC with Differential/Platelet  Essential hypertension- His BP is well controlled.  Will continue the current dose of the ARB. -     CBC with Differential/Platelet; Future -     Basic metabolic panel; Future -     Basic metabolic panel -     CBC with Differential/Platelet -     olmesartan (BENICAR) 40 MG tablet; Take 1 tablet (40 mg total) by mouth daily.  Stage 3a chronic kidney disease ([place])- He will avoid nephrotoxic agents.   I have  discontinued Kacper W. Debell's olmesartan-hydrochlorothiazide. I have also changed his atorvastatin. Additionally, I am having him start on olmesartan. Lastly, I am having him maintain his brimonidine, psyllium, dextromethorphan-guaiFENesin, Lumigan, Cyanocobalamin (VITAMIN B 12 PO), aspirin, Breztri Aerosphere, albuterol, mometasone, pantoprazole, famotidine, allopurinol, and FLUoxetine.  Meds ordered this encounter  Medications   atorvastatin (LIPITOR) 20 MG tablet    Sig: Take 1 tablet (20 mg total) by mouth daily.    Dispense:  90 tablet    Refill:  1   olmesartan (BENICAR) 40 MG tablet    Sig: Take 1 tablet (40 mg total) by mouth daily.    Dispense:  90 tablet    Refill:  1     Follow-up: Return in about 6 months (around 11/19/2021).  Scarlette Calico, MD

## 2021-05-21 NOTE — Patient Instructions (Signed)
Type 2 Diabetes Mellitus, Diagnosis, Adult ?Type 2 diabetes (type 2 diabetes mellitus) is a long-term, or chronic, disease. In type 2 diabetes, one or both of these problems may be present: ?The pancreas does not make enough of a hormone called insulin. ?Cells in the body do not respond properly to the insulin that the body makes (insulin resistance). ?Normally, insulin allows blood sugar (glucose) to enter cells in the body. The cells use glucose for energy. Insulin resistance or lack of insulin causes excess glucose to build up in the blood instead of going into cells. This causes high blood glucose (hyperglycemia).  ?What are the causes? ?The exact cause of type 2 diabetes is not known. ?What increases the risk? ?The following factors may make you more likely to develop this condition: ?Having a family member with type 2 diabetes. ?Being overweight or obese. ?Being inactive (sedentary). ?Having been diagnosed with insulin resistance. ?Having a history of prediabetes, diabetes when you were pregnant (gestational diabetes), or polycystic ovary syndrome (PCOS). ?What are the signs or symptoms? ?In the early stage of this condition, you may not have symptoms. Symptoms develop slowly and may include: ?Increased thirst or hunger. ?Increased urination. ?Unexplained weight loss. ?Tiredness (fatigue) or weakness. ?Vision changes, such as blurry vision. ?Dark patches on the skin. ?How is this diagnosed? ?This condition is diagnosed based on your symptoms, your medical history, a physical exam, and your blood glucose level. Your blood glucose may be checked with one or more of the following blood tests: ?A fasting blood glucose (FBG) test. You will not be allowed to eat (you will fast) for 8 hours or longer before a blood sample is taken. ?A random blood glucose test. This test checks blood glucose at any time of day regardless of when you ate. ?An A1C (hemoglobin A1C) blood test. This test provides information about blood  glucose levels over the previous 2-3 months. ?An oral glucose tolerance test (OGTT). This test measures your blood glucose at two times: ?After fasting. This is your baseline blood glucose level. ?Two hours after drinking a beverage that contains glucose. ?You may be diagnosed with type 2 diabetes if: ?Your fasting blood glucose level is 126 mg/dL (7.0 mmol/L) or higher. ?Your random blood glucose level is 200 mg/dL (11.1 mmol/L) or higher. ?Your A1C level is 6.5% or higher. ?Your oral glucose tolerance test result is higher than 200 mg/dL (11.1 mmol/L). ?These blood tests may be repeated to confirm your diagnosis. ?How is this treated? ?Your treatment may be managed by a specialist called an endocrinologist. Type 2 diabetes may be treated by following instructions from your health care provider about: ?Making dietary and lifestyle changes. These may include: ?Following a personalized nutrition plan that is developed by a registered dietitian. ?Exercising regularly. ?Finding ways to manage stress. ?Checking your blood glucose level as often as told. ?Taking diabetes medicines or insulin daily. This helps to keep your blood glucose levels in the healthy range. ?Taking medicines to help prevent complications from diabetes. Medicines may include: ?Aspirin. ?Medicine to lower cholesterol. ?Medicine to control blood pressure. ?Your health care provider will set treatment goals for you. Your goals will be based on your age, other medical conditions you have, and how you respond to diabetes treatment. Generally, the goal of treatment is to maintain the following blood glucose levels: ?Before meals: 80-130 mg/dL (4.4-7.2 mmol/L). ?After meals: below 180 mg/dL (10 mmol/L). ?A1C level: less than 7%. ?Follow these instructions at home: ?Questions to ask your health care provider ?  Consider asking the following questions: ?Should I meet with a certified diabetes care and education specialist? ?What diabetes medicines do I need,  and when should I take them? ?What equipment will I need to manage my diabetes at home? ?How often do I need to check my blood glucose? ?Where can I find a support group for people with diabetes? ?What number can I call if I have questions? ?When is my next appointment? ?General instructions ?Take over-the-counter and prescription medicines only as told by your health care provider. ?Keep all follow-up visits. This is important. ?Where to find more information ?For help and guidance and for more information about diabetes, please visit: ?American Diabetes Association (ADA): www.diabetes.org ?American Association of Diabetes Care and Education Specialists (ADCES): www.diabeteseducator.org ?International Diabetes Federation (IDF): www.idf.org ?Contact a health care provider if: ?Your blood glucose is at or above 240 mg/dL (13.3 mmol/L) for 2 days in a row. ?You have been sick or have had a fever for 2 days or longer, and you are not getting better. ?You have any of the following problems for more than 6 hours: ?You cannot eat or drink. ?You have nausea and vomiting. ?You have diarrhea. ?Get help right away if: ?You have severe hypoglycemia. This means your blood glucose is lower than 54 mg/dL (3.0 mmol/L). ?You become confused or you have trouble thinking clearly. ?You have difficulty breathing. ?You have moderate or large ketone levels in your urine. ?These symptoms may represent a serious problem that is an emergency. Do not wait to see if the symptoms will go away. Get medical help right away. Call your local emergency services (911 in the [place] Do not drive yourself to the hospital. ?Summary ?Type 2 diabetes mellitus is a long-term, or chronic, disease. In type 2 diabetes, the pancreas does not make enough of a hormone called insulin, or cells in the body do not respond properly to insulin that the body makes. ?This condition is treated by making dietary and lifestyle changes and taking diabetes medicines or  insulin. ?Your health care provider will set treatment goals for you. Your goals will be based on your age, other medical conditions you have, and how you respond to diabetes treatment. ?Keep all follow-up visits. This is important. ?This information is not intended to replace advice given to you by your health care provider. Make sure you discuss any questions you have with your health care provider. ?Document Revised: 08/13/2020 Document Reviewed: 08/13/2020 ?Elsevier Patient Education ? 2022 Elsevier Inc. ? ?

## 2021-06-10 ENCOUNTER — Ambulatory Visit: Payer: Medicare Other | Admitting: Allergy and Immunology

## 2021-06-11 DIAGNOSIS — H353112 Nonexudative age-related macular degeneration, right eye, intermediate dry stage: Secondary | ICD-10-CM | POA: Diagnosis not present

## 2021-06-11 DIAGNOSIS — H53431 Sector or arcuate defects, right eye: Secondary | ICD-10-CM | POA: Diagnosis not present

## 2021-06-11 DIAGNOSIS — H401132 Primary open-angle glaucoma, bilateral, moderate stage: Secondary | ICD-10-CM | POA: Diagnosis not present

## 2021-06-25 DIAGNOSIS — D225 Melanocytic nevi of trunk: Secondary | ICD-10-CM | POA: Diagnosis not present

## 2021-06-25 DIAGNOSIS — L570 Actinic keratosis: Secondary | ICD-10-CM | POA: Diagnosis not present

## 2021-06-25 DIAGNOSIS — D485 Neoplasm of uncertain behavior of skin: Secondary | ICD-10-CM | POA: Diagnosis not present

## 2021-06-25 DIAGNOSIS — X32XXXD Exposure to sunlight, subsequent encounter: Secondary | ICD-10-CM | POA: Diagnosis not present

## 2021-07-17 ENCOUNTER — Ambulatory Visit: Payer: Medicare Other

## 2021-07-24 ENCOUNTER — Ambulatory Visit: Payer: Medicare Other

## 2021-07-30 ENCOUNTER — Other Ambulatory Visit: Payer: Self-pay

## 2021-07-30 ENCOUNTER — Ambulatory Visit (INDEPENDENT_AMBULATORY_CARE_PROVIDER_SITE_OTHER): Payer: Medicare Other | Admitting: Allergy and Immunology

## 2021-07-30 ENCOUNTER — Encounter: Payer: Self-pay | Admitting: Allergy and Immunology

## 2021-07-30 ENCOUNTER — Telehealth: Payer: Self-pay

## 2021-07-30 VITALS — BP 130/78 | HR 88 | Temp 97.9°F | Resp 19

## 2021-07-30 DIAGNOSIS — J849 Interstitial pulmonary disease, unspecified: Secondary | ICD-10-CM

## 2021-07-30 DIAGNOSIS — J3089 Other allergic rhinitis: Secondary | ICD-10-CM

## 2021-07-30 DIAGNOSIS — J324 Chronic pansinusitis: Secondary | ICD-10-CM

## 2021-07-30 DIAGNOSIS — J45901 Unspecified asthma with (acute) exacerbation: Secondary | ICD-10-CM | POA: Diagnosis not present

## 2021-07-30 DIAGNOSIS — J986 Disorders of diaphragm: Secondary | ICD-10-CM

## 2021-07-30 DIAGNOSIS — J441 Chronic obstructive pulmonary disease with (acute) exacerbation: Secondary | ICD-10-CM | POA: Diagnosis not present

## 2021-07-30 DIAGNOSIS — K219 Gastro-esophageal reflux disease without esophagitis: Secondary | ICD-10-CM

## 2021-07-30 MED ORDER — AMOXICILLIN-POT CLAVULANATE 875-125 MG PO TABS
1.0000 | ORAL_TABLET | Freq: Two times a day (BID) | ORAL | 0 refills | Status: AC
Start: 2021-07-30 — End: 2021-08-09

## 2021-07-30 NOTE — Patient Instructions (Addendum)
° °  1. Continue to Treat inflammation:   A. Breztri - 2 inhalations 2 times per day   2. Continue to Treat reflux / LPR:   A. Pantoprazole 40 mg in the a.m. + famotidine 20 mg in the p.m.  B. Minimize alcohol, caffeine, chocolate  C. Decrease abdominal girth  D. No late, big meals in the evening  3. If needed:   A. ProAir HFA 2 puffs every 4-6 hours  B. nasal saline washes  C. OTC antihistamine  D. Mucinex DM 2 tablets twice a day  E. Duoneb nebulized every 4-6 hours if needed.  F. Nasacort one spray each nostril one time per day  G. Acyclovir 200 - 4 times a day during fever blister outbreak  4. For this recent event:   A.  Prednisone 20 mg delivered in clinic now. B.  Prednisone 20 mg this afternoon C.  Prednisone 10 mg - 2 tabs daily x 5 days, then 1 tabs daily x 5 days  D.  Augmentin 875 - 1 tablet 2 times per day for 10 days  E.  Ibuprofen 600 mg 2-3 times per day  F.  Do not become dehydrated and push fluids  D.  DuoNeb administered in clinic today  E.  Oxygen at 2L [place]  5. Return to clinic Friday or earlier if problem

## 2021-07-30 NOTE — Telephone Encounter (Signed)
Called and spoke with the patient, he states that he is feeling much better. He confirmed that [facility] has come by and dropped off the oxygen and supplies for him. He states that he will come by Thursday with our Oxygen tank and return it to the [place] office.

## 2021-07-30 NOTE — Progress Notes (Signed)
[facility]   Follow-up Note  Referring Provider: Janith Lima, MD Primary Provider: Janith Lima, MD Date of Office Visit: 07/30/2021  Subjective:   Edwin [name] (DOB: Jan 21, 1941) is a 81 y.o. male who returns to the [facility] on 07/30/2021 in re-evaluation of the following:  HPI: Edwin [name] returns to this clinic in evaluation of COPD/asthma overlap, ILD, LPR, allergic rhinitis, chronic sinusitis, and right hemiparesis of diaphragm.  I last saw him in this clinic on 19 March 2021.  He was in his usual state of pulmonary health with some dyspnea on exertion at baseline but approximately 7 days ago he had acute onset of shortness of breath and coughing and wheezing and fever and achiness and had some chills and overall feels terrible.  His fever last night was 100.9.  He woke up last night with the sheets soaking wet.  He checked a COVID swab yesterday which was negative.  He has coughed so hard that he has had some laryngeal spasm that lasts several seconds.  He has had gagging and retching.  Allergies as of 07/30/2021       Reactions   Ramipril Other (See Comments)   REACTION: chest congestion & cough. No angioedema        Medication List    albuterol 108 (90 Base) MCG/ACT inhaler Commonly known as: Ventolin HFA INHALE 1-2 PUFFS INTO THE LUNGS EVERY 4 HOURS AS NEEDED FOR WHEEZING OR SHORTNESS OF BREATH   allopurinol 300 MG tablet Commonly known as: ZYLOPRIM TAKE 1 TABLET BY MOUTH EVERY DAY   aspirin 81 MG chewable tablet Commonly known as: Aspirin Childrens Chew 1 tablet (81 mg total) by mouth daily.   atorvastatin 20 MG tablet Commonly known as: LIPITOR Take 1 tablet (20 mg total) by mouth daily.   betamethasone dipropionate 0.05 % cream Apply topically.   Breztri Aerosphere 160-9-4.8 MCG/ACT Aero Generic drug: Budeson-Glycopyrrol-Formoterol Inhale 2 puffs into the lungs in the morning and at  bedtime.   brimonidine 0.1 % Soln Commonly known as: ALPHAGAN P Place 1 drop into both eyes 2 (two) times daily. Both eyes   dextromethorphan-guaiFENesin 30-600 MG 12hr tablet Commonly known as: MUCINEX DM Take 1 tablet by mouth 2 (two) times daily as needed for cough.   famotidine 20 MG tablet Commonly known as: PEPCID TAKE 1 TABLET BY MOUTH EVERYDAY AT BEDTIME   FLUoxetine 10 MG capsule Commonly known as: PROZAC TAKE 1 CAPSULE BY MOUTH EVERY DAY   Lumigan 0.01 % Soln Generic drug: bimatoprost   mometasone 0.1 % ointment Commonly known as: ELOCON Apply topically once daily as needed   olmesartan 40 MG tablet Commonly known as: BENICAR Take 1 tablet (40 mg total) by mouth daily.   pantoprazole 40 MG tablet Commonly known as: PROTONIX TAKE 1 TABLET BY MOUTH EVERY DAY IN THE MORNING   psyllium 58.6 % powder Commonly known as: METAMUCIL Take 1 packet by mouth daily.   VITAMIN B 12 PO Take 1 tablet by mouth daily.    Past Medical History:  Diagnosis Date   Asthma    COPD (chronic obstructive pulmonary disease) ([place])    reactive airway disease   Glaucoma    Gout    Hx of colonic polyps    Dr Earlean Shawl   Hyperlipidemia    NMR lipoprofile 2005: LDL 113(1416/825), HLD 37, TG 190.   Hypertension    Pneumonia     Past Surgical  History:  Procedure Laterality Date   ANTERIOR CERVICAL DECOMP/DISCECTOMY FUSION N/A 03/08/2018   Procedure: ANTERIOR CERVICAL DECOMPRESSION/DISCECTOMY FUSION, INTERBODY PROSTHESIS,PLATE/SCREWS CERVICAL FOUR- CERVICAL FIVE, CERVICAL FIVE- CERVICAL SIX, CERVICAL SIX- CERVICAL SEVEN;  Surgeon: Newman Pies, MD;  Location: [facility];  Service: Neurosurgery;  Laterality: N/A;  anterior   CATARACT EXTRACTION     bilat, implants   [place]   POLYPECTOMY  2003    neg 2006, Dr.Medoff   tear duct surgery  1998   for excess tearing, skin graft L foot @ age 41 1/2    Review of systems negative except as noted  in HPI / PMHx or noted below:  Review of Systems  Constitutional: Negative.   HENT: Negative.    Eyes: Negative.   Respiratory: Negative.    Cardiovascular: Negative.   Gastrointestinal: Negative.   Genitourinary: Negative.   Musculoskeletal: Negative.   Skin: Negative.   Neurological: Negative.   Endo/Heme/Allergies: Negative.   Psychiatric/Behavioral: Negative.      Objective:   Vitals:   07/30/21 0836  BP: 130/78  Pulse: 88  Resp: 19  Temp: 97.9 F (36.6 C)  SpO2: (!) 88%          Physical Exam Constitutional:      Appearance: He is not diaphoretic.  HENT:     Head: Normocephalic.     Right Ear: Tympanic membrane, ear canal and external ear normal.     Left Ear: Tympanic membrane, ear canal and external ear normal.     Nose: Nose normal. No mucosal edema or rhinorrhea.     Mouth/Throat:     Pharynx: Uvula midline. No oropharyngeal exudate.  Eyes:     Conjunctiva/sclera: Conjunctivae normal.  Neck:     Thyroid: No thyromegaly.     Trachea: Trachea normal. No tracheal tenderness or tracheal deviation.  Cardiovascular:     Rate and Rhythm: Normal rate and regular rhythm.     Heart sounds: Normal heart sounds, S1 normal and S2 normal. No murmur heard. Pulmonary:     Effort: No respiratory distress.     Breath sounds: No stridor. Wheezing (Expiratory wheezes most prominently left lower base posteriorly) present. No rales.  Lymphadenopathy:     Head:     Right side of head: No tonsillar adenopathy.     Left side of head: No tonsillar adenopathy.     Cervical: No cervical adenopathy.  Skin:    Findings: No erythema or rash.     Nails: There is no clubbing.  Neurological:     Mental Status: He is alert.    Diagnostics:    Spirometry was performed and demonstrated an FEV1 of 1.27 at 44 % of predicted.  His previous FEV1 was 1.74.  The patient had an Asthma Control Test with the following results: ACT Total Score: 9.    Oxygen saturation on room air at  rest was 92%.  Oxygen saturation on room air walking the hallway once was 88%.  Oxygen saturation on 2 L nasal cannula at rest was 94%.  Oxygen saturation on 2 L nasal cannula walking the hallway once was 93%.  Assessment and Plan:   1. Asthma with COPD with exacerbation ([place])   2. Interstitial lung disease ([place])   3. LPRD (laryngopharyngeal reflux disease)   4. Diaphragm dysfunction   5. Other allergic rhinitis   6. Chronic pansinusitis     1. Continue to Treat inflammation:   A. Breztri -  2 inhalations 2 times per day   2. Continue to Treat reflux / LPR:   A. Pantoprazole 40 mg in the a.m. + famotidine 20 mg in the p.m.  B. Minimize alcohol, caffeine, chocolate  C. Decrease abdominal girth  D. No late, big meals in the evening  3. If needed:   A. ProAir HFA 2 puffs every 4-6 hours  B. nasal saline washes  C. OTC antihistamine  D. Mucinex DM 2 tablets twice a day  E. Duoneb nebulized every 4-6 hours if needed.  F. Nasacort one spray each nostril one time per day  G. Acyclovir 200 - 4 times a day during fever blister outbreak  4. For this recent event:   A.  Prednisone 20 mg delivered in clinic now. B.  Prednisone 20 mg this afternoon C.  Prednisone 10 mg - 2 tabs daily x 5 days, then 1 tabs daily x 5 days  D.  Augmentin 875 - 1 tablet 2 times per day for 10 days  E.  Ibuprofen 600 mg 2-3 times per day  F.  Do not become dehydrated and push fluids  D.  DuoNeb administered in clinic today  E.  Oxygen at 2L [place]  5. Return to clinic Friday or earlier if problem    Skyler appears to have an infection that is affecting his airway.  Although this may be viral in nature I am going to give him a broad-spectrum antibiotic as he does appear to have some localized findings on physical exam today suggesting that there is a significant pocket of inflammation/infection affecting his left lower lung.  And I will give him some anti-inflammatory agents to deal with the inflammatory  component of this issue.  Assuming he does well with this plan I will see him back in this clinic in Friday or earlier if there is a problem.   Allena Katz, MD Allergy / Immunology [place]

## 2021-07-30 NOTE — Telephone Encounter (Signed)
Spoke with Caryl Pina at [facility] to help get patient set up with oxygen at home. Order has been placed and Caryl Pina from [facility] stated they will pull patient and give him a call to get it set up. Caryl Pina will contact the office if there is any other information needed.

## 2021-07-30 NOTE — Telephone Encounter (Signed)
Received Oxygen Therapy SWO form, form has been signed and faxed back to [place].

## 2021-07-31 ENCOUNTER — Encounter: Payer: Self-pay | Admitting: Allergy and Immunology

## 2021-08-01 ENCOUNTER — Ambulatory Visit (INDEPENDENT_AMBULATORY_CARE_PROVIDER_SITE_OTHER): Payer: Medicare Other

## 2021-08-01 DIAGNOSIS — Z0000 Encounter for general adult medical examination without abnormal findings: Secondary | ICD-10-CM

## 2021-08-01 NOTE — Progress Notes (Signed)
I connected with Edwin [name] today by telephone and verified that I am speaking with the correct person using two identifiers. Location patient: home Location provider: work Persons participating in the virtual visit: patient, provider.   I discussed the limitations, risks, security and privacy concerns of performing an evaluation and management service by telephone and the availability of in person appointments. I also discussed with the patient that there may be a patient responsible charge related to this service. The patient expressed understanding and verbally consented to this telephonic visit.    Interactive audio and video telecommunications were attempted between this provider and patient, however failed, due to patient having technical difficulties OR patient did not have access to video capability.  We continued and completed visit with audio only.  Some vital signs may be absent or patient reported.   Time Spent with patient on telephone encounter: 40 minutes  Subjective:   Edwin [name] is a 81 y.o. male who presents for Medicare Annual/Subsequent preventive examination.  Review of Systems     Cardiac Risk Factors include: advanced age (>74men, >44 women);dyslipidemia;family history of premature cardiovascular disease;hypertension;male gender;diabetes mellitus     Objective:    There were no vitals filed for this visit. There is no height or weight on file to calculate BMI.  Advanced Directives 08/01/2021 12/30/2018 03/08/2018 11/10/2017 03/04/2016 01/06/2016 02/15/2015  Does Patient Have a Medical Advance Directive? Yes Yes Yes Yes Yes Yes No;Yes  Type of Advance Directive Living will;Healthcare Power of [place];Living will Living will [place];Living will [place];Living will [place];Living will Living will  Does patient want to make changes to medical advance directive? No - Patient  declined - No - Patient declined - No - Patient declined No - Patient declined -  Copy of [place] in Chart? No - copy requested No - copy requested - No - copy requested Yes No - copy requested -  Pre-existing out of facility DNR order (yellow form or pink MOST form) - - - - - - -    Current Medications (verified) Outpatient Encounter Medications as of 08/01/2021  Medication Sig   albuterol (VENTOLIN HFA) 108 (90 Base) MCG/ACT inhaler INHALE 1-2 PUFFS INTO THE LUNGS EVERY 4 HOURS AS NEEDED FOR WHEEZING OR SHORTNESS OF BREATH   allopurinol (ZYLOPRIM) 300 MG tablet TAKE 1 TABLET BY MOUTH EVERY DAY   amoxicillin-clavulanate (AUGMENTIN) 875-125 MG tablet Take 1 tablet by mouth 2 (two) times daily for 10 days.   aspirin (ASPIRIN CHILDRENS) 81 MG chewable tablet Chew 1 tablet (81 mg total) by mouth daily.   atorvastatin (LIPITOR) 20 MG tablet Take 1 tablet (20 mg total) by mouth daily.   betamethasone dipropionate 0.05 % cream Apply topically.   brimonidine (ALPHAGAN P) 0.1 % SOLN Place 1 drop into both eyes 2 (two) times daily. Both eyes   Budeson-Glycopyrrol-Formoterol (BREZTRI AEROSPHERE) 160-9-4.8 MCG/ACT AERO Inhale 2 puffs into the lungs in the morning and at bedtime.   Cyanocobalamin (VITAMIN B 12 PO) Take 1 tablet by mouth daily.   dextromethorphan-guaiFENesin (MUCINEX DM) 30-600 MG 12hr tablet Take 1 tablet by mouth 2 (two) times daily as needed for cough.    famotidine (PEPCID) 20 MG tablet TAKE 1 TABLET BY MOUTH EVERYDAY AT BEDTIME   FLUoxetine (PROZAC) 10 MG capsule TAKE 1 CAPSULE BY MOUTH EVERY DAY   LUMIGAN 0.01 % SOLN    mometasone (ELOCON) 0.1 % ointment Apply topically  once daily as needed   olmesartan (BENICAR) 40 MG tablet Take 1 tablet (40 mg total) by mouth daily.   pantoprazole (PROTONIX) 40 MG tablet TAKE 1 TABLET BY MOUTH EVERY DAY IN THE MORNING   psyllium (METAMUCIL) 58.6 % powder Take 1 packet by mouth daily.   No facility-administered encounter  medications on file as of 08/01/2021.    Allergies (verified) Ramipril   History: Past Medical History:  Diagnosis Date   Asthma    COPD (chronic obstructive pulmonary disease) ([place])    reactive airway disease   Glaucoma    Gout    Hx of colonic polyps    Dr Earlean Shawl   Hyperlipidemia    NMR lipoprofile 2005: LDL 113(1416/825), HLD 37, TG 190.   Hypertension    Pneumonia    Past Surgical History:  Procedure Laterality Date   ANTERIOR CERVICAL DECOMP/DISCECTOMY FUSION N/A 03/08/2018   Procedure: ANTERIOR CERVICAL DECOMPRESSION/DISCECTOMY FUSION, INTERBODY PROSTHESIS,PLATE/SCREWS CERVICAL FOUR- CERVICAL FIVE, CERVICAL FIVE- CERVICAL SIX, CERVICAL SIX- CERVICAL SEVEN;  Surgeon: Newman Pies, MD;  Location: [facility];  Service: Neurosurgery;  Laterality: N/A;  anterior   CATARACT EXTRACTION     bilat, implants   [place]   POLYPECTOMY  2003    neg 2006, Dr.Medoff   tear duct surgery  1998   for excess tearing, skin graft L foot @ age 42 1/2   Family History  Problem Relation Age of Onset   Heart attack Father 65       due to asthma flare   Asthma Father    Emphysema Father    Hypertension Mother    Cancer Mother         ? primary   Asthma Son        x2   Allergic rhinitis Neg Hx    Angioedema Neg Hx    Atopy Neg Hx    Eczema Neg Hx    Immunodeficiency Neg Hx    Urticaria Neg Hx    Social History   Socioeconomic History   Marital status: Married    Spouse name: Not on file   Number of children: 2   Years of education: Not on file   Highest education level: Not on file  Occupational History   Occupation: Charity fundraiser   Occupation: PRESIDENT    Employer: [employer]  Tobacco Use   Smoking status: Former    Packs/day: 3.00    Years: 25.00    Pack years: 75.00    Types: Cigarettes    Start date: 06/03/1963    Quit date: 06/03/1983    Years since quitting: 38.1   Smokeless tobacco: Never  Vaping Use   Vaping Use: Never used   Substance and Sexual Activity   Alcohol use: No   Drug use: No   Sexual activity: Yes  Other Topics Concern   Not on file  Social History Narrative   Regular Exercise- no         Social Determinants of Health   Financial Resource Strain: Low Risk    Difficulty of Paying Living Expenses: Not hard at all  Food Insecurity: No Food Insecurity   Worried About Charity fundraiser in the Last Year: Never true   Ran Out of Food in the Last Year: Never true  Transportation Needs: No Transportation Needs   Lack of Transportation (Medical): No   Lack of Transportation (Non-Medical): No  Physical Activity: Inactive   Days  of Exercise per Week: 0 days   Minutes of Exercise per Session: 0 min  Stress: No Stress Concern Present   Feeling of Stress : Not at all  Social Connections: Socially Integrated   Frequency of Communication with Friends and Family: More than three times a week   Frequency of Social Gatherings with Friends and Family: More than three times a week   Attends Religious Services: More than 4 times per year   Active Member of [employer] or Organizations: Yes   Attends Archivist Meetings: Never   Marital Status: Married    Tobacco Counseling Counseling given: Not Answered   Clinical Intake:  Pre-visit preparation completed: Yes  Pain : No/denies pain     Nutritional Risks: None Diabetes: Yes CBG done?: No Did pt. bring in CBG monitor from home?: No  How often do you need to have someone help you when you read instructions, pamphlets, or other written materials from your doctor or pharmacy?: 1 - Never What is the last grade level you completed in school?: Bachelor's Degree  Diabetic? yes  Interpreter Needed?: No  Information entered by :: Lisette Abu, LPN   Activities of Daily Living In your present state of health, do you have any difficulty performing the following activities: 08/01/2021 10/01/2020  Hearing? Tempie Donning  Vision? N N  Difficulty  concentrating or making decisions? N N  Walking or climbing stairs? N Y  Comment - due to COPD  Dressing or bathing? N N  Doing errands, shopping? N N  Preparing Food and eating ? N -  Using the Toilet? N -  In the past six months, have you accidently leaked urine? N -  Do you have problems with loss of bowel control? N -  Managing your Medications? N -  Managing your Finances? N -  Housekeeping or managing your Housekeeping? N -  Some recent data might be hidden    Patient Care Team: Janith Lima, MD as PCP - General (Internal Medicine) Tanda Rockers, MD as Consulting Physician (Pulmonary Disease) Neldon Mc Donnamarie Poag, MD as Consulting Physician (Allergy and Immunology) Keene Breath., MD (Ophthalmology)  Indicate any recent Medical Services you may have received from other than [facility] providers in the past year (date may be approximate).     Assessment:   This is a routine wellness examination for Kelvyn.  Hearing/Vision screen Hearing Screening - Comments:: Patient has hearing difficulty and wears hearing aids. Vision Screening - Comments:: Patient wears corrective glasses/contacts.  Eye exam done annually by: Barbie Haggis, MD.  Dietary issues and exercise activities discussed: Current Exercise Habits: The patient does not participate in regular exercise at present, Exercise limited by: respiratory conditions(s)   Goals Addressed             This Visit's Progress    Patient Stated       Patient declined health goal at this time.      Depression Screen PHQ 2/9 Scores 08/01/2021 05/21/2021 10/01/2020 10/01/2020 09/08/2019 12/30/2018 08/19/2018  PHQ - 2 Score 0 0 0 0 0 0 0  PHQ- 9 Score - 0 3 - 2 - -    Fall Risk Fall Risk  08/01/2021 10/01/2020 09/08/2019 12/30/2018 08/19/2018  Falls in the past year? 0 0 0 0 0  Number falls in past yr: 0 0 0 0 -  Injury with Fall? 0 0 0 0 -  Risk for fall due to : No Fall Risks - Orthopedic patient - -  Follow up Falls evaluation completed -  Falls evaluation completed - -    FALL RISK PREVENTION PERTAINING TO THE HOME:  Any stairs in or around the home? Yes  If so, are there any without handrails? No  Home free of loose throw rugs in walkways, pet beds, electrical cords, etc? Yes  Adequate lighting in your home to reduce risk of falls? Yes   ASSISTIVE DEVICES UTILIZED TO PREVENT FALLS:  Life alert? No  Use of a cane, walker or w/c? No  Grab bars in the bathroom? Yes  Shower chair or bench in shower? Yes  Elevated toilet seat or a handicapped toilet? No   TIMED UP AND GO:  Was the test performed? No .  Length of time to ambulate 10 feet: n/a sec.   Gait steady and fast without use of assistive device  Cognitive Function: Normal cognitive status assessed by direct observation by this Nurse Health Advisor. No abnormalities found.   MMSE - Mini Mental State Exam 11/10/2017  Not completed: Refused        Immunizations Immunization History  Administered Date(s) Administered   Influenza Split 02/19/2012   Influenza Whole 03/09/2010, 03/17/2011   Influenza, High Dose Seasonal PF 01/18/2015, 03/03/2016, 02/17/2017, 02/22/2018, 01/17/2019   Influenza-Unspecified 03/04/2014, 03/13/2021   PFIZER(Purple Top)SARS-COV-2 Vaccination 06/23/2019, 07/13/2019, 02/20/2020, 06/07/2020   Pfizer Covid-19 Vaccine Bivalent Booster 5y-11y 03/15/2021   Pneumococcal Conjugate-13 03/09/2008, 01/23/2016   Pneumococcal Polysaccharide-23 07/08/2012, 09/08/2019   Td 06/02/2006   Tdap 01/23/2016   Zoster Recombinat (Shingrix) 01/17/2019, 05/14/2019   Zoster, Live 06/26/2010    TDAP status: Up to date  Flu Vaccine status: Up to date  Pneumococcal vaccine status: Up to date  Covid-19 vaccine status: Completed vaccines  Qualifies for Shingles Vaccine? Yes   Zostavax completed Yes   Shingrix Completed?: Yes  Screening Tests Health Maintenance  Topic Date Due   FOOT EXAM  Never done   OPHTHALMOLOGY EXAM  Never done   HEMOGLOBIN  A1C  11/19/2021   TETANUS/TDAP  01/22/2026   Pneumonia Vaccine 17+ Years old  Completed   INFLUENZA VACCINE  Completed   COVID-19 Vaccine  Completed   Zoster Vaccines- Shingrix  Completed   HPV VACCINES  Aged Out    Health Maintenance  Health Maintenance Due  Topic Date Due   FOOT EXAM  Never done   OPHTHALMOLOGY EXAM  Never done    Colorectal cancer screening: No longer required.   Lung Cancer Screening: (Low Dose CT Chest recommended if Age 78-80 years, 30 pack-year currently smoking OR have quit w/in 15years.) does not qualify.   Lung Cancer Screening Referral: no  Additional Screening:  Hepatitis C Screening: does not qualify; Completed no  Vision Screening: Recommended annual ophthalmology exams for early detection of glaucoma and other disorders of the eye. Is the patient up to date with their annual eye exam?  Yes  Who is the provider or what is the name of the office in which the patient attends annual eye exams? Dr. Barbie Haggis If pt is not established with a provider, would they like to be referred to a provider to establish care? No .   Dental Screening: Recommended annual dental exams for proper oral hygiene  Community Resource Referral / Chronic Care Management: CRR required this visit?  No   CCM required this visit?  No      Plan:     I have personally reviewed and noted the following in the patients chart:   Medical  and social history Use of alcohol, tobacco or illicit drugs  Current medications and supplements including opioid prescriptions. Patient is not currently taking opioid prescriptions. Functional ability and status Nutritional status Physical activity Advanced directives List of other physicians Hospitalizations, surgeries, and ER visits in previous 12 months Vitals Screenings to include cognitive, depression, and falls Referrals and appointments  In addition, I have reviewed and discussed with patient certain preventive protocols,  quality metrics, and best practice recommendations. A written personalized care plan for preventive services as well as general preventive health recommendations were provided to patient.     Sheral Flow, LPN   01/07/4165   Nurse Notes:  Patient is cogitatively intact. There were no vitals filed for this visit. There is no height or weight on file to calculate BMI. Patient stated that he has no issues with gait or balance; does not use any assistive devices.

## 2021-08-01 NOTE — Patient Instructions (Signed)
Edwin [name] , Thank you for taking time to come for your Medicare Wellness Visit. I appreciate your ongoing commitment to your health goals. Please review the following plan we discussed and let me know if I can assist you in the future.   Screening recommendations/referrals: Colonoscopy: Not a candidate for screening due to age Recommended yearly ophthalmology/optometry visit for glaucoma screening and checkup Recommended yearly dental visit for hygiene and checkup  Vaccinations: Influenza vaccine: 03/13/2021 Pneumococcal vaccine: 03/09/2008, 07/08/2012, 01/23/2016, 09/08/2019 Tdap vaccine: 01/23/2016; due every 10 years Shingles vaccine: 01/17/2019, 05/14/2019   Covid-19: 06/23/2019, 07/13/2019, 02/20/2020, 06/07/2020, 03/15/2021  Advanced directives: Please bring a copy of your health care power of attorney and living will to the office at your convenience.  Conditions/risks identified: Yes; Client understands the importance of follow-up appointments with providers by attending scheduled visits and discussed goals to eat healthier, increase physical activity 5 times a week for 30 minutes each, exercise the brain by doing stimulating brain exercises (reading, adult coloring, crafting, listening to music, puzzles, etc.), socialize and enjoy life more, get enough sleep at least 8-9 hours average per night and make time for laughter.  Next appointment: Please schedule your next Medicare Wellness Visit with your Nurse Health Advisor in 1 year by calling [phone].  Preventive Care 35 Years and Older, Male Preventive care refers to lifestyle choices and visits with your health care provider that can promote health and wellness. What does preventive care include? A yearly physical exam. This is also called an annual well check. Dental exams once or twice a year. Routine eye exams. Ask your health care provider how often you should have your eyes checked. Personal lifestyle choices, including: Daily care  of your teeth and gums. Regular physical activity. Eating a healthy diet. Avoiding tobacco and drug use. Limiting alcohol use. Practicing safe sex. Taking low doses of aspirin every day. Taking vitamin and mineral supplements as recommended by your health care provider. What happens during an annual well check? The services and screenings done by your health care provider during your annual well check will depend on your age, overall health, lifestyle risk factors, and family history of disease. Counseling  Your health care provider may ask you questions about your: Alcohol use. Tobacco use. Drug use. Emotional well-being. Home and relationship well-being. Sexual activity. Eating habits. History of falls. Memory and ability to understand (cognition). Work and work Statistician. Screening  You may have the following tests or measurements: Height, weight, and BMI. Blood pressure. Lipid and cholesterol levels. These may be checked every 5 years, or more frequently if you are over 52 years old. Skin check. Lung cancer screening. You may have this screening every year starting at age 53 if you have a 30-pack-year history of smoking and currently smoke or have quit within the past 15 years. Fecal occult blood test (FOBT) of the stool. You may have this test every year starting at age 17. Flexible sigmoidoscopy or colonoscopy. You may have a sigmoidoscopy every 5 years or a colonoscopy every 10 years starting at age 72. Prostate cancer screening. Recommendations will vary depending on your family history and other risks. Hepatitis C blood test. Hepatitis B blood test. Sexually transmitted disease (STD) testing. Diabetes screening. This is done by checking your blood sugar (glucose) after you have not eaten for a while (fasting). You may have this done every 1-3 years. Abdominal aortic aneurysm (AAA) screening. You may need this if you are a current or former smoker. Osteoporosis. You may  be screened starting at age 21 if you are at high risk. Talk with your health care provider about your test results, treatment options, and if necessary, the need for more tests. Vaccines  Your health care provider may recommend certain vaccines, such as: Influenza vaccine. This is recommended every year. Tetanus, diphtheria, and acellular pertussis (Tdap, Td) vaccine. You may need a Td booster every 10 years. Zoster vaccine. You may need this after age 42. Pneumococcal 13-valent conjugate (PCV13) vaccine. One dose is recommended after age 21. Pneumococcal polysaccharide (PPSV23) vaccine. One dose is recommended after age 79. Talk to your health care provider about which screenings and vaccines you need and how often you need them. This information is not intended to replace advice given to you by your health care provider. Make sure you discuss any questions you have with your health care provider. Document Released: 06/15/2015 Document Revised: 02/06/2016 Document Reviewed: 03/20/2015 Elsevier Interactive Patient Education  2017 [place] Prevention in the Home Falls can cause injuries. They can happen to people of all ages. There are many things you can do to make your home safe and to help prevent falls. What can I do on the outside of my home? Regularly fix the edges of walkways and driveways and fix any cracks. Remove anything that might make you trip as you walk through a door, such as a raised step or threshold. Trim any bushes or trees on the path to your home. Use bright outdoor lighting. Clear any walking paths of anything that might make someone trip, such as rocks or tools. Regularly check to see if handrails are loose or broken. Make sure that both sides of any steps have handrails. Any raised decks and porches should have guardrails on the edges. Have any leaves, snow, or ice cleared regularly. Use sand or salt on walking paths during winter. Clean up any spills in  your garage right away. This includes oil or grease spills. What can I do in the bathroom? Use night lights. Install grab bars by the toilet and in the tub and shower. Do not use towel bars as grab bars. Use non-skid mats or decals in the tub or shower. If you need to sit down in the shower, use a plastic, non-slip stool. Keep the floor dry. Clean up any water that spills on the floor as soon as it happens. Remove soap buildup in the tub or shower regularly. Attach bath mats securely with double-sided non-slip rug tape. Do not have throw rugs and other things on the floor that can make you trip. What can I do in the bedroom? Use night lights. Make sure that you have a light by your bed that is easy to reach. Do not use any sheets or blankets that are too big for your bed. They should not hang down onto the floor. Have a firm chair that has side arms. You can use this for support while you get dressed. Do not have throw rugs and other things on the floor that can make you trip. What can I do in the kitchen? Clean up any spills right away. Avoid walking on wet floors. Keep items that you use a lot in easy-to-reach places. If you need to reach something above you, use a strong step stool that has a grab bar. Keep electrical cords out of the way. Do not use floor polish or wax that makes floors slippery. If you must use wax, use non-skid floor wax. Do not have  throw rugs and other things on the floor that can make you trip. What can I do with my stairs? Do not leave any items on the stairs. Make sure that there are handrails on both sides of the stairs and use them. Fix handrails that are broken or loose. Make sure that handrails are as long as the stairways. Check any carpeting to make sure that it is firmly attached to the stairs. Fix any carpet that is loose or worn. Avoid having throw rugs at the top or bottom of the stairs. If you do have throw rugs, attach them to the floor with carpet  tape. Make sure that you have a light switch at the top of the stairs and the bottom of the stairs. If you do not have them, ask someone to add them for you. What else can I do to help prevent falls? Wear shoes that: Do not have high heels. Have rubber bottoms. Are comfortable and fit you well. Are closed at the toe. Do not wear sandals. If you use a stepladder: Make sure that it is fully opened. Do not climb a closed stepladder. Make sure that both sides of the stepladder are locked into place. Ask someone to hold it for you, if possible. Clearly mark and make sure that you can see: Any grab bars or handrails. First and last steps. Where the edge of each step is. Use tools that help you move around (mobility aids) if they are needed. These include: Canes. Walkers. Scooters. Crutches. Turn on the lights when you go into a dark area. Replace any light bulbs as soon as they burn out. Set up your furniture so you have a clear path. Avoid moving your furniture around. If any of your floors are uneven, fix them. If there are any pets around you, be aware of where they are. Review your medicines with your doctor. Some medicines can make you feel dizzy. This can increase your chance of falling. Ask your doctor what other things that you can do to help prevent falls. This information is not intended to replace advice given to you by your health care provider. Make sure you discuss any questions you have with your health care provider. Document Released: 03/15/2009 Document Revised: 10/25/2015 Document Reviewed: 06/23/2014 Elsevier Interactive Patient Education  2017 [employer].

## 2021-08-02 ENCOUNTER — Encounter: Payer: Self-pay | Admitting: Allergy

## 2021-08-02 ENCOUNTER — Other Ambulatory Visit: Payer: Self-pay

## 2021-08-02 ENCOUNTER — Ambulatory Visit (INDEPENDENT_AMBULATORY_CARE_PROVIDER_SITE_OTHER): Payer: Medicare Other | Admitting: Allergy

## 2021-08-02 VITALS — BP 124/74 | HR 94 | Temp 98.4°F | Resp 20 | Ht 70.0 in | Wt 212.2 lb

## 2021-08-02 DIAGNOSIS — J441 Chronic obstructive pulmonary disease with (acute) exacerbation: Secondary | ICD-10-CM | POA: Diagnosis not present

## 2021-08-02 DIAGNOSIS — J45901 Unspecified asthma with (acute) exacerbation: Secondary | ICD-10-CM

## 2021-08-02 MED ORDER — ALBUTEROL SULFATE (2.5 MG/3ML) 0.083% IN NEBU
2.5000 mg | INHALATION_SOLUTION | Freq: Once | RESPIRATORY_TRACT | Status: AC
  Administered 2021-08-02: 2.5 mg via RESPIRATORY_TRACT

## 2021-08-02 NOTE — Progress Notes (Signed)
? ? ?Follow-up Note ? ?RE: TYBERIUS RYNER MRN: [id number] DOB: 31-Aug-1940 ?Date of Office Visit: 08/02/2021 ? ? ?History of present illness: ?Edwin [name] is a 81 y.o. male presenting today for follow-up of asthma with COPD with exacerbation.  He also has history of interstitial lung disease, LPRD, diaphragm dysfunction, allergic rhinitis with history of pansinusitis.  She was seen in the office on 07/30/2021 Dr. Neldon Mc.  At that visit he was having dyspnea on exertion and acute shortness of breath and coughing with wheeze, fever and achiness.  At that visit his oxygen saturations were 92% at rest.  Upon walking it dropped to 88%.  He was placed on 2 L nasal cannula all that improved to 94% at rest and 93% walking. ?He was recommended to take a 10-day prednisone burst as well as Augmentin.  He was also set up with home oxygen to use with activity. ? ?He states he is doing much better.  He states his breathing is much better.  The productive mucus he states has gone from green to clear.  ?He states he slept "like a baby" last night.  ?He has been taking his prednisone and Augmentin.  He states he has been using Advil PM at night.  He states he does not need to use oxygen with sleeping.  He has used it during the day at times.  He does have a pulse ox at home.  He states his pulse ox has been mostly 94% at home. ? ?Review of systems: ?Review of Systems  ?Constitutional:   ?     See HPI  ?HENT: Negative.    ?Eyes: Negative.   ?Respiratory:    ?     See HPI  ?Cardiovascular: Negative.   ?Musculoskeletal: Negative.   ?Skin: Negative.   ?Allergic/Immunologic: Negative.   ?Neurological: Negative.    ? ?All other systems negative unless noted above in HPI ? ?Past medical/social/surgical/family history have been reviewed and are unchanged unless specifically indicated below. ? ?No changes ? ?Medication List: ?Current Outpatient Medications  ?Medication Sig Dispense Refill  ? albuterol (VENTOLIN HFA) 108 (90 Base) MCG/ACT  inhaler INHALE 1-2 PUFFS INTO THE LUNGS EVERY 4 HOURS AS NEEDED FOR WHEEZING OR SHORTNESS OF BREATH 18 g 2  ? allopurinol (ZYLOPRIM) 300 MG tablet TAKE 1 TABLET BY MOUTH EVERY DAY 90 tablet 1  ? amoxicillin-clavulanate (AUGMENTIN) 875-125 MG tablet Take 1 tablet by mouth 2 (two) times daily for 10 days. 20 tablet 0  ? aspirin (ASPIRIN CHILDRENS) 81 MG chewable tablet Chew 1 tablet (81 mg total) by mouth daily.    ? atorvastatin (LIPITOR) 20 MG tablet Take 1 tablet (20 mg total) by mouth daily. 90 tablet 1  ? betamethasone dipropionate 0.05 % cream Apply topically.    ? brimonidine (ALPHAGAN P) 0.1 % SOLN Place 1 drop into both eyes 2 (two) times daily. Both eyes    ? Budeson-Glycopyrrol-Formoterol (BREZTRI AEROSPHERE) 160-9-4.8 MCG/ACT AERO Inhale 2 puffs into the lungs in the morning and at bedtime. 10.7 g 5  ? Cyanocobalamin (VITAMIN B 12 PO) Take 1 tablet by mouth daily.    ? dextromethorphan-guaiFENesin (MUCINEX DM) 30-600 MG 12hr tablet Take 1 tablet by mouth 2 (two) times daily as needed for cough.     ? famotidine (PEPCID) 20 MG tablet TAKE 1 TABLET BY MOUTH EVERYDAY AT BEDTIME 90 tablet 1  ? FLUoxetine (PROZAC) 10 MG capsule TAKE 1 CAPSULE BY MOUTH EVERY DAY 90 capsule 1  ? LUMIGAN 0.01 %  SOLN     ? mometasone (ELOCON) 0.1 % ointment Apply topically once daily as needed 45 g 1  ? olmesartan (BENICAR) 40 MG tablet Take 1 tablet (40 mg total) by mouth daily. 90 tablet 1  ? pantoprazole (PROTONIX) 40 MG tablet TAKE 1 TABLET BY MOUTH EVERY DAY IN THE MORNING 90 tablet 1  ? psyllium (METAMUCIL) 58.6 % powder Take 1 packet by mouth daily.    ? ?No current facility-administered medications for this visit.  ?  ? ?Known medication allergies: ?Allergies  ?Allergen Reactions  ? Ramipril Other (See Comments)  ?  REACTION: chest congestion & cough. No angioedema  ? ? ? ?Physical examination: ?Blood pressure 124/74, pulse 94, temperature 98.4 ?F (36.9 ?C), resp. rate 20, height 5\' 10"  (1.778 m), weight 212 lb 4 oz (96.3  kg), SpO2 94 %. ? ?General: Alert, interactive, in no acute distress. ?HEENT: PERRLA, TMs pearly gray, turbinates minimally edematous without discharge, post-pharynx non erythematous. ?Neck: Supple without lymphadenopathy. ?Lungs: Expiratory wheeze throughout all [place] otherwise clear without rales or rhonchi . {no increased work of breathing. ?CV: Normal S1, S2 without murmurs. ?Abdomen: Nondistended, nontender. ?Skin: Warm and dry, without lesions or rashes. ?Extremities:  No clubbing, cyanosis or edema. ?Neuro:   Grossly intact. ? ?Diagnositics/Labs: ?Albuterol nebulized today in the office due to wheezing prior to discharge. ? ? ?Assessment and plan: ?Asthma with COPD with exacerbation ?Interstitial lung disease ?LPRD ?Diaphragm dysfunction ?Other allergic rhinitis ?Chronic pansinusitis ? ?He is doing much better from his respiratory illness with the prednisone and Augmentin.  I have advised him to complete these courses.  I have also counseled him on ensuring that his oxygen saturations remain above 92% at home (hopefully higher).  He is hopeful that this will not be something he will need to continue on long-term. ? ?1. Continue to Treat inflammation: ? ? AJudithann Sauger - 2 inhalations 2 times per day  ? ?2. Continue to Treat reflux / LPR: ? ? A. Pantoprazole 40 mg in the a.m. + famotidine 20 mg in the p.m. ? B. Minimize alcohol, caffeine, chocolate ? C. Decrease abdominal girth ? D. No late, big meals in the evening ? ?3. If needed: ? ? A. ProAir HFA 2 puffs every 4-6 hours ? B. nasal saline washes ? C. OTC antihistamine ? D. Mucinex DM 2 tablets twice a day ? E. Duoneb nebulized every 4-6 hours if needed. ? F. Nasacort one spray each nostril one time per day ? G. Acyclovir 200 - 4 times a day during fever blister outbreak ? ?4. Complete the medications as below for current episde: ? ?A.  Prednisone 10 day course as directed ? B.  Augmentin 10 day course as directed ? C.  Ibuprofen 600 mg 2-3 times per day ? D.   Do not become dehydrated and push fluids ? E.  DuoNeb administered in clinic today for wheeze ? F.  Check pulse ox at home periodically and maintain oxygen levels above 92%.   If dropping below 92% then can try use of your albuterol inhaler/nebulizer and/or start supplemental Oxygen 2L therapy ? ?5. Return to clinic in 4 weeks with Dr. Neldon Mc ? ?I appreciate the opportunity to take part in Pratt's care. Please do not hesitate to contact me with questions. ? ?Sincerely, ? ? ?Prudy Feeler, MD ?Allergy/Immunology ?[facility] ? ? ?

## 2021-08-02 NOTE — Patient Instructions (Addendum)
? ?  1. Continue to Treat inflammation: ? ? AJudithann Sauger - 2 inhalations 2 times per day  ? ?2. Continue to Treat reflux / LPR: ? ? A. Pantoprazole 40 mg in the a.m. + famotidine 20 mg in the p.m. ? B. Minimize alcohol, caffeine, chocolate ? C. Decrease abdominal girth ? D. No late, big meals in the evening ? ?3. If needed: ? ? A. ProAir HFA 2 puffs every 4-6 hours ? B. nasal saline washes ? C. OTC antihistamine ? D. Mucinex DM 2 tablets twice a day ? E. Duoneb nebulized every 4-6 hours if needed. ? F. Nasacort one spray each nostril one time per day ? G. Acyclovir 200 - 4 times a day during fever blister outbreak ? ?4. Complete the medications as below for current episde: ? ?A.  Prednisone 10 day course as directed ? B.  Augmentin 10 day course as directed ? C.  Ibuprofen 600 mg 2-3 times per day ? D.  Do not become dehydrated and push fluids ? E.  DuoNeb administered in clinic today for wheeze ? F.  Check pulse ox at home periodically and maintain oxygen levels above 92%.   If dropping below 92% then can try use of your albuterol inhaler/nebulizer and/or start supplemental Oxygen 2L therapy ? ?5. Return to clinic in 4 weeks with Dr. Neldon Mc ? ?  ?  ? ?  ? ?  ? ?   ?  ?

## 2021-08-23 ENCOUNTER — Other Ambulatory Visit: Payer: Self-pay

## 2021-08-23 ENCOUNTER — Encounter: Payer: Self-pay | Admitting: Podiatry

## 2021-08-23 ENCOUNTER — Ambulatory Visit (INDEPENDENT_AMBULATORY_CARE_PROVIDER_SITE_OTHER): Payer: Medicare Other | Admitting: Podiatry

## 2021-08-23 DIAGNOSIS — N1831 Chronic kidney disease, stage 3a: Secondary | ICD-10-CM

## 2021-08-23 DIAGNOSIS — M79674 Pain in right toe(s): Secondary | ICD-10-CM | POA: Diagnosis not present

## 2021-08-23 DIAGNOSIS — B351 Tinea unguium: Secondary | ICD-10-CM | POA: Diagnosis not present

## 2021-08-23 DIAGNOSIS — E118 Type 2 diabetes mellitus with unspecified complications: Secondary | ICD-10-CM

## 2021-08-23 DIAGNOSIS — M79675 Pain in left toe(s): Secondary | ICD-10-CM | POA: Diagnosis not present

## 2021-08-23 NOTE — Progress Notes (Signed)
This patient presents  to my office for at risk foot care.  This patient requires this care by a professional since this patient will be at risk due to having diabetes and stage 3a CKD.  This patient is unable to cut nails himself since the patient cannot reach his nails.These nails are painful walking and wearing shoes.  He says his wife cut his nails and drew blood.  This patient presents for at risk foot care today. ? ?General Appearance  Alert, conversant and in no acute stress. ? ?Vascular  Dorsalis pedis and posterior tibial  pulses are palpable  bilaterally.  Capillary return is within normal limits  bilaterally. Temperature is within normal limits  bilaterally. ? ?Neurologic  Senn-Weinstein monofilament wire test within normal limits  bilaterally. Muscle power within normal limits bilaterally. ? ?Nails Thick disfigured discolored nails with subungual debris  from hallux to fifth toes bilaterally. No evidence of bacterial infection or drainage bilaterally. ? ?Orthopedic  No limitations of motion  feet .  No crepitus or effusions noted.  No bony pathology or digital deformities noted. Mild  HAV  B/L.  DJD 1st  MPJ  B/L. ? ?Skin  normotropic skin with no porokeratosis noted bilaterally.  No signs of infections or ulcers noted.    ? ?Onychomycosis  Pain in right toes  Pain in left toes ? ?Consent was obtained for treatment procedures.   Mechanical debridement of nails 1-5  bilaterally performed with a nail nipper.  Filed with dremel without incident.  ? ? ?Return office visit   3 months                   Told patient to return for periodic foot care and evaluation due to potential at risk complications. ? ? ?Gardiner Barefoot DPM   ?

## 2021-08-27 ENCOUNTER — Ambulatory Visit (INDEPENDENT_AMBULATORY_CARE_PROVIDER_SITE_OTHER): Payer: Medicare Other | Admitting: Allergy and Immunology

## 2021-08-27 ENCOUNTER — Other Ambulatory Visit: Payer: Self-pay

## 2021-08-27 ENCOUNTER — Encounter: Payer: Self-pay | Admitting: Allergy and Immunology

## 2021-08-27 VITALS — BP 148/82 | HR 89 | Temp 97.8°F | Resp 16 | Ht 70.5 in | Wt 212.5 lb

## 2021-08-27 DIAGNOSIS — M810 Age-related osteoporosis without current pathological fracture: Secondary | ICD-10-CM

## 2021-08-27 DIAGNOSIS — J449 Chronic obstructive pulmonary disease, unspecified: Secondary | ICD-10-CM | POA: Diagnosis not present

## 2021-08-27 DIAGNOSIS — J849 Interstitial pulmonary disease, unspecified: Secondary | ICD-10-CM | POA: Diagnosis not present

## 2021-08-27 DIAGNOSIS — J3089 Other allergic rhinitis: Secondary | ICD-10-CM | POA: Diagnosis not present

## 2021-08-27 DIAGNOSIS — J479 Bronchiectasis, uncomplicated: Secondary | ICD-10-CM | POA: Diagnosis not present

## 2021-08-27 DIAGNOSIS — K219 Gastro-esophageal reflux disease without esophagitis: Secondary | ICD-10-CM | POA: Diagnosis not present

## 2021-08-27 MED ORDER — BREZTRI AEROSPHERE 160-9-4.8 MCG/ACT IN AERO: 2.0000 | INHALATION_SPRAY | Freq: Two times a day (BID) | RESPIRATORY_TRACT | 5 refills | Status: DC

## 2021-08-27 MED ORDER — FAMOTIDINE 20 MG PO TABS
ORAL_TABLET | ORAL | 1 refills | Status: DC
Start: 2021-08-27 — End: 2021-11-19

## 2021-08-27 MED ORDER — LEVOCETIRIZINE DIHYDROCHLORIDE 5 MG PO TABS: 5.0000 mg | ORAL_TABLET | Freq: Two times a day (BID) | ORAL | 5 refills | Status: DC | PRN

## 2021-08-27 MED ORDER — TRIAMCINOLONE ACETONIDE 55 MCG/ACT NA AERO: 1.0000 | INHALATION_SPRAY | Freq: Every day | NASAL | 5 refills | Status: DC

## 2021-08-27 MED ORDER — PANTOPRAZOLE SODIUM 40 MG PO TBEC: 40.0000 mg | DELAYED_RELEASE_TABLET | Freq: Every morning | ORAL | 1 refills | Status: DC

## 2021-08-27 MED ORDER — ALBUTEROL SULFATE HFA 108 (90 BASE) MCG/ACT IN AERS: 2.0000 | INHALATION_SPRAY | RESPIRATORY_TRACT | 1 refills | Status: DC | PRN

## 2021-08-27 MED ORDER — DM-GUAIFENESIN ER 30-600 MG PO TB12: 1.0000 | ORAL_TABLET | Freq: Two times a day (BID) | ORAL | 3 refills | Status: DC | PRN

## 2021-08-27 MED ORDER — ACYCLOVIR 200 MG PO CAPS
200.0000 mg | ORAL_CAPSULE | Freq: Four times a day (QID) | ORAL | 5 refills | Status: DC
Start: 2021-08-27 — End: 2022-07-15

## 2021-08-27 NOTE — Progress Notes (Addendum)
? ?[facility] ? ? ?Follow-up Note ? ?Referring Provider: Janith Lima, MD ?Primary Provider: Janith Lima, MD ?Date of Office Visit: 08/27/2021 ? ?Subjective:  ? ?Edwin [name] (DOB: 06-23-1940) is a 81 y.o. male who returns to the [facility] on 08/27/2021 in re-evaluation of the following: ? ?HPI: Edwin [name] presents to this clinic in evaluation of COPD/asthma overlap, interstitial lung disease, bronchiectasis, right hemiparesis of diaphragm, allergic rhinitis/chronic sinusitis, LPR.  His last visit to this clinic with me was 30 July 2021. ? ?During his last visit he had a very significant febrile respiratory event for which we started him on oxygen and gave him prolonged systemic steroids and broad-spectrum antibiotic.  He is improved significantly from that event although he still has lots of difficulty getting mucus out of his chest.  He still goes through coughing spells unable to clear his lungs of mucus.  He is now using oxygen mostly in the afternoon and at night.  He does believe that oxygen administration makes him feel much better.  He usually starts his oxygen when his oxygen saturation drops down to 88 to 90%. ? ?At this point his upper airways are doing quite well.  He believes that his reflux is also under good control at this point.  He has not been having any problems with fever blisters. ? ?He has never had a bone densitometry scan completed. ? ?Allergies as of 08/27/2021   ? ?   Reactions  ? Ramipril Other (See Comments)  ? REACTION: chest congestion & cough. No angioedema  ? ?  ? ?  ?Medication List  ? ? ?albuterol 108 (90 Base) MCG/ACT inhaler ?Commonly known as: Ventolin HFA ?INHALE 1-2 PUFFS INTO THE LUNGS EVERY 4 HOURS AS NEEDED FOR WHEEZING OR SHORTNESS OF BREATH ?  ?allopurinol 300 MG tablet ?Commonly known as: ZYLOPRIM ?TAKE 1 TABLET BY MOUTH EVERY DAY ?  ?aspirin 81 MG chewable tablet ?Commonly known as: Aspirin  Childrens ?Chew 1 tablet (81 mg total) by mouth daily. ?  ?atorvastatin 20 MG tablet ?Commonly known as: LIPITOR ?Take 1 tablet (20 mg total) by mouth daily. ?  ?betamethasone dipropionate 0.05 % cream ?Apply topically. ?  ?Breztri Aerosphere 160-9-4.8 MCG/ACT Aero ?Generic drug: Budeson-Glycopyrrol-Formoterol ?Inhale 2 puffs into the lungs in the morning and at bedtime. ?  ?brimonidine 0.1 % Soln ?Commonly known as: ALPHAGAN P ?Place 1 drop into both eyes 2 (two) times daily. Both eyes ?  ?dextromethorphan-guaiFENesin 30-600 MG 12hr tablet ?Commonly known as: Butts DM ?Take 1 tablet by mouth 2 (two) times daily as needed for cough. ?  ?famotidine 20 MG tablet ?Commonly known as: PEPCID ?TAKE 1 TABLET BY MOUTH EVERYDAY AT BEDTIME ?  ?FLUoxetine 10 MG capsule ?Commonly known as: PROZAC ?TAKE 1 CAPSULE BY MOUTH EVERY DAY ?  ?Lumigan 0.01 % Soln ?Generic drug: bimatoprost ?  ?mometasone 0.1 % ointment ?Commonly known as: ELOCON ?Apply topically once daily as needed ?  ?olmesartan 40 MG tablet ?Commonly known as: BENICAR ?Take 1 tablet (40 mg total) by mouth daily. ?  ?pantoprazole 40 MG tablet ?Commonly known as: PROTONIX ?TAKE 1 TABLET BY MOUTH EVERY DAY IN THE MORNING ?  ?psyllium 58.6 % powder ?Commonly known as: METAMUCIL ?Take 1 packet by mouth daily. ?  ?VITAMIN B 12 PO ?Take 1 tablet by mouth daily. ?  ? ?  ? ? ?Past Medical History:  ?Diagnosis Date  ? Asthma   ?  COPD (chronic obstructive pulmonary disease) ([place])   ? reactive airway disease  ? Glaucoma   ? Gout   ? Hx of colonic polyps   ? Dr Earlean Shawl  ? Hyperlipidemia   ? NMR lipoprofile 2005: LDL 169(6789/381), HLD 37, TG 190.  ? Hypertension   ? Pneumonia   ? ? ?Past Surgical History:  ?Procedure Laterality Date  ? ANTERIOR CERVICAL DECOMP/DISCECTOMY FUSION N/A 03/08/2018  ? Procedure: ANTERIOR CERVICAL DECOMPRESSION/DISCECTOMY FUSION, INTERBODY PROSTHESIS,PLATE/SCREWS CERVICAL FOUR- CERVICAL FIVE, CERVICAL FIVE- CERVICAL SIX, CERVICAL SIX- CERVICAL SEVEN;   Surgeon: Newman Pies, MD;  Location: [facility];  Service: Neurosurgery;  Laterality: N/A;  anterior  ? CATARACT EXTRACTION    ? bilat, implants  ? CHOLECYSTECTOMY  1995  ? LUMBAR LAMINECTOMY  1982  ? POLYPECTOMY  2003  ?  neg 2006, Dr.Medoff  ? tear duct surgery  1998  ? for excess tearing, skin graft L foot @ age 26 1/2  ? ? ?Review of systems negative except as noted in HPI / PMHx or noted below: ? ?Review of Systems  ?Constitutional: Negative.   ?HENT: Negative.    ?Eyes: Negative.   ?Respiratory: Negative.    ?Cardiovascular: Negative.   ?Gastrointestinal: Negative.   ?Genitourinary: Negative.   ?Musculoskeletal: Negative.   ?Skin: Negative.   ?Neurological: Negative.   ?Endo/Heme/Allergies: Negative.   ?Psychiatric/Behavioral: Negative.    ? ? ?Objective:  ? ?Vitals:  ? 08/27/21 1045  ?BP: (!) 148/82  ?Pulse: 89  ?Resp: 16  ?Temp: 97.8 ?F (36.6 ?C)  ?SpO2: 95%  ? ?Height: 5' 10.5" (179.1 cm)  ?Weight: 212 lb 8 oz (96.4 kg)  ? ?Physical Exam ?Constitutional:   ?   Appearance: He is not diaphoretic.  ?   Comments: Coughing  ?HENT:  ?   Head: Normocephalic.  ?   Right Ear: Tympanic membrane, ear canal and external ear normal.  ?   Left Ear: Tympanic membrane, ear canal and external ear normal.  ?   Nose: Nose normal. No mucosal edema or rhinorrhea.  ?   Mouth/Throat:  ?   Pharynx: Uvula midline. No oropharyngeal exudate.  ?Eyes:  ?   Conjunctiva/sclera: Conjunctivae normal.  ?Neck:  ?   Thyroid: No thyromegaly.  ?   Trachea: Trachea normal. No tracheal tenderness or tracheal deviation.  ?Cardiovascular:  ?   Rate and Rhythm: Normal rate and regular rhythm.  ?   Heart sounds: Normal heart sounds, S1 normal and S2 normal. No murmur heard. ?Pulmonary:  ?   Effort: No respiratory distress.  ?   Breath sounds: No stridor. Wheezing (Scattered expiratory wheezes posterior lung Gillie bilaterally) present. No rales.  ?Lymphadenopathy:  ?   Head:  ?   Right side of head: No tonsillar adenopathy.  ?   Left side of head: No  tonsillar adenopathy.  ?   Cervical: No cervical adenopathy.  ?Skin: ?   Findings: No erythema or rash.  ?   Nails: There is no clubbing.  ?Neurological:  ?   Mental Status: He is alert.  ? ? ?Diagnostics:  ?  ?Spirometry was performed and demonstrated an FEV1 of 1.46 at 50 % of predicted. ? ?Assessment and Plan:  ? ?1. COPD with asthma ([place])   ?2. Interstitial lung disease ([place])   ?3. Bronchiectasis without complication ([place])   ?4. Other allergic rhinitis   ?5. LPRD (laryngopharyngeal reflux disease)   ? ? ?1. Continue to Treat inflammation: ? ? AJudithann Sauger - 2 inhalations 2 times per  day  ? ?2. Continue to Treat reflux / LPR: ? ? A. Pantoprazole 40 mg in the a.m. + famotidine 20 mg in the p.m. ? B. Minimize alcohol, caffeine, chocolate ? C. Decrease abdominal girth ? D. No late, big meals in the evening ? ?3. If needed: ? ? A. ProAir HFA 2 puffs every 4-6 hours ? B. nasal saline washes ? C. OTC antihistamine ? D. Mucinex DM 2 tablets twice a day ? E. Duoneb nebulized every 4-6 hours if needed. ? F. Nasacort one spray each nostril one time per day ? G. Acyclovir 200 - 4 times a day during fever blister outbreak ? ?4.  Continue oxygen at 2 liters [place] administration ? ?5. Submit for [employer] percussion vest ? ?6.  Obtain bone densitometry scan for osteoporosis  ? ?7. Return to clinic in 12 weeks or earlier if problem ? ?Edwin [name] is certainly a lot better than he was during his last evaluation with me at which point in time he had a febrile illness associated with hypoxemia.  But he is just not clearing out his lungs of the mucus.  I think it would be best if we tried him on a percussion vest to see if we can clear out his chest of mucus.  As well, we need to obtain a bone densitometry scan given the amount of systemic steroid that he has received over the course of the past 10 years or so.  He will continue on a collection of anti-inflammatory agents for his airway and also continue to address his reflux problem as  noted above.  I will see him back in this clinic in 12 weeks or earlier if there is a problem.  ? ?Allena Katz, MD ?Allergy / Immunology ?[facility] ? ?Addendum 09 September 2021: For appr

## 2021-08-27 NOTE — Patient Instructions (Addendum)
? ?  1. Continue to Treat inflammation: ? ? AJudithann Sauger - 2 inhalations 2 times per day  ? ?2. Continue to Treat reflux / LPR: ? ? A. Pantoprazole 40 mg in the a.m. + famotidine 20 mg in the p.m. ? B. Minimize alcohol, caffeine, chocolate ? C. Decrease abdominal girth ? D. No late, big meals in the evening ? ?3. If needed: ? ? A. ProAir HFA 2 puffs every 4-6 hours ? B. nasal saline washes ? C. OTC antihistamine ? D. Mucinex DM 2 tablets twice a day ? E. Duoneb nebulized every 4-6 hours if needed. ? F. Nasacort one spray each nostril one time per day ? G. Acyclovir 200 - 4 times a day during fever blister outbreak ? ?4.  Continue oxygen at 2 liters [place] administration ? ?5. Submit for [employer] percussion vest ? ?6.  Obtain bone densitometry scan for osteoporosis  ? ?7. Return to clinic in 12 weeks or earlier if problem ? ?  ?  ? ?  ? ?  ? ?   ?  ?

## 2021-08-28 ENCOUNTER — Encounter: Payer: Self-pay | Admitting: Allergy and Immunology

## 2021-08-29 ENCOUNTER — Telehealth: Payer: Self-pay | Admitting: *Deleted

## 2021-08-29 ENCOUNTER — Encounter: Payer: Self-pay | Admitting: *Deleted

## 2021-08-29 NOTE — Telephone Encounter (Signed)
Called patient and provided with phone number and address to [facility] so that he can call and get his Bone Density Scan scheduled. Prescription and chart notes have been faxed to [employer] at [phone].  ?

## 2021-08-29 NOTE — Telephone Encounter (Signed)
-----   Message from Jiles Prows, MD sent at 08/28/2021  6:44 AM EDT ----- ?Do we have bone density study scheduled? How is the submission for the percussion vest going? Any response yet from [employer]?  ? ?

## 2021-09-09 NOTE — Telephone Encounter (Signed)
Spoke with Judson Roch from [employer] and she states that she needs an addendum to his most recent office note stating that you considered a manual CPT but due to the patients advanced lung disease he could not tolerate. She states that it also needs to say that he has had a productive cough for more than 6 months. After the addendum I will fax to her and they can mail out the vest to the patient. ?

## 2021-09-09 NOTE — Telephone Encounter (Signed)
New office notes have been faxed to [place] at [phone].  ?

## 2021-09-12 NOTE — Telephone Encounter (Signed)
Addendum note has been faxed to [place] with [facility].  ?

## 2021-09-12 NOTE — Telephone Encounter (Signed)
Spoke with Edwin [name] with [employer] and she states that there needs to be one more addendum to your last note with today's date just stating that the patient would not be able to tolerate the Manual CPT due to the percussion and positioning that comes with performing the Manual CPT.  ?

## 2021-09-25 ENCOUNTER — Other Ambulatory Visit: Payer: Self-pay | Admitting: Internal Medicine

## 2021-09-25 DIAGNOSIS — M1000 Idiopathic gout, unspecified site: Secondary | ICD-10-CM

## 2021-09-30 NOTE — Telephone Encounter (Signed)
Received email from [place] with [employer],  ?"patient has been trained in the home and is using his vest. Please see attached training form and scan into pts chart."  ? ?I have printed training form and placed in nurses station to be scanned in.  ? ? ?

## 2021-09-30 NOTE — Telephone Encounter (Signed)
Noted! Thank you

## 2021-10-14 ENCOUNTER — Other Ambulatory Visit: Payer: Self-pay | Admitting: Internal Medicine

## 2021-10-14 DIAGNOSIS — E785 Hyperlipidemia, unspecified: Secondary | ICD-10-CM

## 2021-10-14 DIAGNOSIS — E118 Type 2 diabetes mellitus with unspecified complications: Secondary | ICD-10-CM

## 2021-10-14 DIAGNOSIS — F3342 Major depressive disorder, recurrent, in full remission: Secondary | ICD-10-CM

## 2021-10-14 DIAGNOSIS — I10 Essential (primary) hypertension: Secondary | ICD-10-CM

## 2021-11-13 ENCOUNTER — Ambulatory Visit (INDEPENDENT_AMBULATORY_CARE_PROVIDER_SITE_OTHER): Payer: Medicare Other | Admitting: Podiatry

## 2021-11-13 ENCOUNTER — Encounter: Payer: Self-pay | Admitting: Podiatry

## 2021-11-13 DIAGNOSIS — E118 Type 2 diabetes mellitus with unspecified complications: Secondary | ICD-10-CM | POA: Diagnosis not present

## 2021-11-13 DIAGNOSIS — M79674 Pain in right toe(s): Secondary | ICD-10-CM

## 2021-11-13 DIAGNOSIS — M79675 Pain in left toe(s): Secondary | ICD-10-CM

## 2021-11-13 DIAGNOSIS — B351 Tinea unguium: Secondary | ICD-10-CM

## 2021-11-13 NOTE — Progress Notes (Signed)
This patient presents  to my office for at risk foot care.  This patient requires this care by a professional since this patient will be at risk due to having diabetes and stage 3a CKD.  This patient is unable to cut nails himself since the patient cannot reach his nails.These nails are painful walking and wearing shoes.   This patient presents for at risk foot care today.  General Appearance  Alert, conversant and in no acute stress.  Vascular  Dorsalis pedis and posterior tibial  pulses are palpable  bilaterally.  Capillary return is within normal limits  bilaterally. Temperature is within normal limits  bilaterally.  Neurologic  Senn-Weinstein monofilament wire test within normal limits  bilaterally. Muscle power within normal limits bilaterally.  Nails Thick disfigured discolored nails with subungual debris  from hallux to fifth toes bilaterally. No evidence of bacterial infection or drainage bilaterally.  Orthopedic  No limitations of motion  feet .  No crepitus or effusions noted.  No bony pathology or digital deformities noted. Mild  HAV  B/L.  DJD 1st  MPJ  B/L.  Skin  normotropic skin with no porokeratosis noted bilaterally.  No signs of infections or ulcers noted.     Onychomycosis  Pain in right toes  Pain in left toes  Consent was obtained for treatment procedures.   Mechanical debridement of nails 1-5  bilaterally performed with a nail nipper.  Filed with dremel without incident.    Return office visit  10 weeks                   Told patient to return for periodic foot care and evaluation due to potential at risk complications.   Jamaal Bernasconi DPM   

## 2021-11-19 ENCOUNTER — Other Ambulatory Visit: Payer: Self-pay | Admitting: Internal Medicine

## 2021-11-19 ENCOUNTER — Ambulatory Visit (INDEPENDENT_AMBULATORY_CARE_PROVIDER_SITE_OTHER): Payer: Medicare Other | Admitting: Allergy and Immunology

## 2021-11-19 VITALS — BP 138/82 | HR 90 | Temp 98.4°F | Resp 18 | Ht 67.32 in | Wt 213.0 lb

## 2021-11-19 DIAGNOSIS — K219 Gastro-esophageal reflux disease without esophagitis: Secondary | ICD-10-CM

## 2021-11-19 DIAGNOSIS — J849 Interstitial pulmonary disease, unspecified: Secondary | ICD-10-CM | POA: Diagnosis not present

## 2021-11-19 DIAGNOSIS — J3089 Other allergic rhinitis: Secondary | ICD-10-CM

## 2021-11-19 DIAGNOSIS — J986 Disorders of diaphragm: Secondary | ICD-10-CM

## 2021-11-19 DIAGNOSIS — J479 Bronchiectasis, uncomplicated: Secondary | ICD-10-CM

## 2021-11-19 DIAGNOSIS — B001 Herpesviral vesicular dermatitis: Secondary | ICD-10-CM

## 2021-11-19 DIAGNOSIS — I10 Essential (primary) hypertension: Secondary | ICD-10-CM

## 2021-11-19 DIAGNOSIS — J449 Chronic obstructive pulmonary disease, unspecified: Secondary | ICD-10-CM | POA: Diagnosis not present

## 2021-11-19 DIAGNOSIS — E118 Type 2 diabetes mellitus with unspecified complications: Secondary | ICD-10-CM

## 2021-11-19 MED ORDER — FAMOTIDINE 20 MG PO TABS: 20.0000 mg | ORAL_TABLET | Freq: Every evening | ORAL | 1 refills | Status: DC

## 2021-11-19 MED ORDER — TRIAMCINOLONE ACETONIDE 55 MCG/ACT NA AERO: 1.0000 | INHALATION_SPRAY | Freq: Every day | NASAL | 5 refills | Status: DC

## 2021-11-19 MED ORDER — ALBUTEROL SULFATE HFA 108 (90 BASE) MCG/ACT IN AERS: 2.0000 | INHALATION_SPRAY | RESPIRATORY_TRACT | 1 refills | Status: DC | PRN

## 2021-11-19 MED ORDER — PANTOPRAZOLE SODIUM 40 MG PO TBEC: 40.0000 mg | DELAYED_RELEASE_TABLET | Freq: Every morning | ORAL | 1 refills | Status: DC

## 2021-11-19 MED ORDER — LEVOCETIRIZINE DIHYDROCHLORIDE 5 MG PO TABS: 5.0000 mg | ORAL_TABLET | Freq: Two times a day (BID) | ORAL | 5 refills | Status: DC | PRN

## 2021-11-19 MED ORDER — DM-GUAIFENESIN ER 30-600 MG PO TB12: 1.0000 | ORAL_TABLET | Freq: Two times a day (BID) | ORAL | 3 refills | Status: AC | PRN

## 2021-11-19 MED ORDER — BREZTRI AEROSPHERE 160-9-4.8 MCG/ACT IN AERO
2.0000 | INHALATION_SPRAY | Freq: Two times a day (BID) | RESPIRATORY_TRACT | 5 refills | Status: DC
Start: 2021-11-19 — End: 2022-05-13

## 2021-11-19 NOTE — Patient Instructions (Signed)
   1. Continue to Treat inflammation:   A. Breztri - 2 inhalations 2 times per day   2. Continue to Treat reflux / LPR:   A. Pantoprazole 40 mg in the a.m. + famotidine 20 mg in the p.m.  3. If needed:   A. ProAir HFA 2 puffs every 4-6 hours  B. nasal saline washes  C. OTC antihistamine  D. Mucinex DM 2 tablets twice a day  E. Duoneb nebulized every 4-6 hours if needed.  F. Nasacort one spray each nostril one time per day  G. Acyclovir 200 - 4 times a day during fever blister outbreak  4.  Continue oxygen at 2 liters [place] administration  5.  Continue [employer] percussion vest  6.  Obtain bone densitometry scan for osteoporosis September 2023  7. Return to clinic in 6 months or earlier if problem

## 2021-11-19 NOTE — Progress Notes (Unsigned)
Edwin [name]   Follow-up Note  Referring Provider: Janith Lima, MD Primary Provider: Janith Lima, MD Date of Office Visit: 11/19/2021  Subjective:   Edwin [name] (DOB: 1941/02/08) is a 81 y.o. male who returns to the [facility] on 11/19/2021 in re-evaluation of the following:  HPI: [name] returns to this clinic in evaluation of COPD/asthma overlap, interstitial lung disease, bronchiectasis, right hemiparesis from diaphragm, allergic rhinitis and chronic sinusitis, and LPR and history of herpes labialis.  His last visit to this clinic was 27 August 2021.  He is doing wonderful which is a big change for [employer].  He has been doing well ever since he started his percussion vest.  He feels that this helps clear out his chest and he does not have to cough and gag and retch to clear out his chest at this point.  He is still somewhat short of breath if he exerts himself to any significant degree.  He still has oxygen available when needed.  But lately he has not had to use any of his oxygen.  He bases that she is on home oximetry measurements and he usually will start to put on his oxygen around 94% and Lasix.  His reflux has been under excellent control.  His bone densitometry scan is scheduled for September 2023.  He has not had any fever blister outbreaks.  Allergies as of 11/19/2021       Reactions   Ramipril Other (See Comments)   REACTION: chest congestion & cough. No angioedema        Medication List    acyclovir 200 MG capsule Commonly known as: Zovirax Take 1 capsule (200 mg total) by mouth 4 (four) times daily.   albuterol 108 (90 Base) MCG/ACT inhaler Commonly known as: Ventolin HFA Inhale 2 puffs into the lungs every 4 (four) hours as needed for wheezing or shortness of breath. INHALE 1-2 PUFFS INTO THE LUNGS EVERY 4 HOURS AS NEEDED FOR WHEEZING OR SHORTNESS OF BREATH   allopurinol 300 MG  tablet Commonly known as: ZYLOPRIM TAKE 1 TABLET BY MOUTH EVERY DAY   aspirin 81 MG chewable tablet Commonly known as: Aspirin Childrens Chew 1 tablet (81 mg total) by mouth daily.   atorvastatin 20 MG tablet Commonly known as: LIPITOR TAKE 1 TABLET BY MOUTH EVERY DAY   betamethasone dipropionate 0.05 % cream Apply topically.   Breztri Aerosphere 160-9-4.8 MCG/ACT Aero Generic drug: Budeson-Glycopyrrol-Formoterol Inhale 2 puffs into the lungs in the morning and at bedtime.   brimonidine 0.1 % Soln Commonly known as: ALPHAGAN P Place 1 drop into both eyes 2 (two) times daily. Both eyes   brimonidine 0.2 % ophthalmic solution Commonly known as: ALPHAGAN   dextromethorphan-guaiFENesin 30-600 MG 12hr tablet Commonly known as: MUCINEX DM Take 1 tablet by mouth 2 (two) times daily as needed for cough.   famotidine 20 MG tablet Commonly known as: PEPCID TAKE 1 TABLET BY MOUTH EVERYDAY AT BEDTIME   FLUoxetine 10 MG capsule Commonly known as: PROZAC TAKE 1 CAPSULE BY MOUTH EVERY DAY   levocetirizine 5 MG tablet Commonly known as: XYZAL Take 1 tablet (5 mg total) by mouth 2 (two) times daily as needed for allergies (Can take an extra dose during flare).   Lumigan 0.01 % Soln Generic drug: bimatoprost   mometasone 0.1 % ointment Commonly known as: ELOCON Apply topically once daily as needed   olmesartan 40 MG tablet Commonly  known as: BENICAR TAKE 1 TABLET BY MOUTH EVERY DAY   pantoprazole 40 MG tablet Commonly known as: PROTONIX Take 1 tablet (40 mg total) by mouth in the morning. TAKE 1 TABLET BY MOUTH EVERY DAY IN THE MORNING   psyllium 58.6 % powder Commonly known as: METAMUCIL Take 1 packet by mouth daily.   triamcinolone 55 MCG/ACT Aero nasal inhaler Commonly known as: NASACORT Place 1 spray into the nose daily.   VITAMIN B 12 PO Take 1 tablet by mouth daily.    Past Medical History:  Diagnosis Date   Asthma    COPD (chronic obstructive pulmonary  disease) ([place])    reactive airway disease   Glaucoma    Gout    Hx of colonic polyps    Dr Earlean Shawl   Hyperlipidemia    NMR lipoprofile 2005: LDL 113(1416/825), HLD 37, TG 190.   Hypertension    Pneumonia     Past Surgical History:  Procedure Laterality Date   ANTERIOR CERVICAL DECOMP/DISCECTOMY FUSION N/A 03/08/2018   Procedure: ANTERIOR CERVICAL DECOMPRESSION/DISCECTOMY FUSION, INTERBODY PROSTHESIS,PLATE/SCREWS CERVICAL FOUR- CERVICAL FIVE, CERVICAL FIVE- CERVICAL SIX, CERVICAL SIX- CERVICAL SEVEN;  Surgeon: Newman Pies, MD;  Location: [facility];  Service: Neurosurgery;  Laterality: N/A;  anterior   CATARACT EXTRACTION     bilat, implants   [place]   POLYPECTOMY  2003    neg 2006, Dr.Medoff   tear duct surgery  1998   for excess tearing, skin graft L foot @ age 24 1/2    Review of systems negative except as noted in HPI / PMHx or noted below:  Review of Systems  Constitutional: Negative.   HENT: Negative.    Eyes: Negative.   Respiratory: Negative.    Cardiovascular: Negative.   Gastrointestinal: Negative.   Genitourinary: Negative.   Musculoskeletal: Negative.   Skin: Negative.   Neurological: Negative.   Endo/Heme/Allergies: Negative.   Psychiatric/Behavioral: Negative.       Objective:   Vitals:   11/19/21 1510  BP: 138/82  Pulse: 90  Resp: 18  Temp: 98.4 F (36.9 C)  SpO2: 93%   Height: 5' 7.32" (171 cm)  Weight: 213 lb (96.6 kg)   Physical Exam Constitutional:      Appearance: He is not diaphoretic.  HENT:     Head: Normocephalic.     Right Ear: Tympanic membrane, ear canal and external ear normal.     Left Ear: Tympanic membrane, ear canal and external ear normal.     Nose: Nose normal. No mucosal edema or rhinorrhea.     Mouth/Throat:     Pharynx: Uvula midline. No oropharyngeal exudate.  Eyes:     Conjunctiva/sclera: Conjunctivae normal.  Neck:     Thyroid: No thyromegaly.     Trachea: Trachea  normal. No tracheal tenderness or tracheal deviation.  Cardiovascular:     Rate and Rhythm: Normal rate and regular rhythm.     Heart sounds: Normal heart sounds, S1 normal and S2 normal. No murmur heard. Pulmonary:     Effort: No respiratory distress.     Breath sounds: Normal breath sounds. No stridor. No wheezing or rales.  Lymphadenopathy:     Head:     Right side of head: No tonsillar adenopathy.     Left side of head: No tonsillar adenopathy.     Cervical: No cervical adenopathy.  Skin:    Findings: No erythema or rash.     Nails: There is  no clubbing.  Neurological:     Mental Status: He is alert.     Diagnostics:    Spirometry was performed and demonstrated an FEV1 of 1.57 at 60 % of predicted.  The patient had an Asthma Control Test with the following results:  .    Assessment and Plan:   1. COPD with asthma ([place])   2. Interstitial lung disease ([place])   3. Bronchiectasis without complication (H[facility])   4. Other allergic rhinitis   5. LPRD (laryngopharyngeal reflux disease)   6. Herpes labialis   7. Diaphragm dysfunction     1. Continue to Treat inflammation:   A. Breztri - 2 inhalations 2 times per day   2. Continue to Treat reflux / LPR:   A. Pantoprazole 40 mg in the a.m. + famotidine 20 mg in the p.m.  3. If needed:   A. ProAir HFA 2 puffs every 4-6 hours  B. nasal saline washes  C. OTC antihistamine  D. Mucinex DM 2 tablets twice a day  E. Duoneb nebulized every 4-6 hours if needed.  F. Nasacort one spray each nostril one time per day  G. Acyclovir 200 - 4 times a day during fever blister outbreak  4.  Continue oxygen at 2 liters [place] administration  5.  Continue [employer] percussion vest  6.  Obtain bone densitometry scan for osteoporosis September 2023  7. Return to clinic in 6 months or earlier if problem  Whitley appears to be doing quite well on his current plan and it does appear that the use of his percussion vest twice a day has resulted  in significant improvement regarding clearing of mucus from his airway and we will keep him on this vest at this point in time as well as a collection of anti-inflammatory agents for his airway and therapy directed against reflux as noted above.  He has several other agents that he can utilize should they be required as noted above.  Assuming he does well with this plan I will see him back in this clinic in 6 months or earlier if there is a problem.   Allena Katz, MD Allergy / Immunology [place]

## 2021-11-20 ENCOUNTER — Encounter: Payer: Self-pay | Admitting: Allergy and Immunology

## 2021-11-27 ENCOUNTER — Telehealth: Payer: Self-pay | Admitting: Allergy and Immunology

## 2021-11-27 ENCOUNTER — Ambulatory Visit: Payer: Medicare Other | Admitting: Podiatry

## 2021-11-27 MED ORDER — PREDNISONE 10 MG PO TABS: ORAL_TABLET | ORAL | 0 refills | Status: DC

## 2021-11-27 MED ORDER — AZITHROMYCIN 500 MG PO TABS
ORAL_TABLET | ORAL | 0 refills | Status: DC
Start: 2021-11-27 — End: 2022-07-10

## 2021-11-27 NOTE — Telephone Encounter (Signed)
Patient's wife called stating that patient does not feel good. He had a fever last night. However, he is currently having a headache, some coughing, and thick mucus. They did a at home covid test which was negative.  [place] are at the beach. They are requesting some medication be sent to [facility] in [address] [place] [address]  Please advise.

## 2021-11-27 NOTE — Telephone Encounter (Signed)
Rx have been sent as instructed by Dr Neldon Mc. Patient have been notified.

## 2021-12-04 ENCOUNTER — Telehealth: Payer: Self-pay | Admitting: Allergy and Immunology

## 2021-12-04 MED ORDER — PREDNISONE 10 MG PO TABS: ORAL_TABLET | ORAL | 0 refills | Status: DC

## 2021-12-04 NOTE — Telephone Encounter (Signed)
Patient called and said that he was some better but still has a cough and green mucus and wanted to see if you could call in something else for him. He made appointment for Tuesday to see . [facility] [phone]

## 2021-12-04 NOTE — Telephone Encounter (Signed)
Patient's wife called in on 6/28/203. Prescriptions were sent.

## 2021-12-04 NOTE — Telephone Encounter (Signed)
Spoken to patient and sent in prednisone 10 mg as instructed by Dr Neldon Mc

## 2021-12-10 ENCOUNTER — Ambulatory Visit (INDEPENDENT_AMBULATORY_CARE_PROVIDER_SITE_OTHER): Payer: Medicare Other | Admitting: Allergy and Immunology

## 2021-12-10 VITALS — BP 128/82 | HR 81 | Temp 98.1°F | Ht 68.0 in | Wt 204.0 lb

## 2021-12-10 DIAGNOSIS — K219 Gastro-esophageal reflux disease without esophagitis: Secondary | ICD-10-CM | POA: Diagnosis not present

## 2021-12-10 DIAGNOSIS — J479 Bronchiectasis, uncomplicated: Secondary | ICD-10-CM | POA: Diagnosis not present

## 2021-12-10 DIAGNOSIS — J449 Chronic obstructive pulmonary disease, unspecified: Secondary | ICD-10-CM

## 2021-12-10 DIAGNOSIS — J3089 Other allergic rhinitis: Secondary | ICD-10-CM

## 2021-12-10 DIAGNOSIS — J986 Disorders of diaphragm: Secondary | ICD-10-CM

## 2021-12-10 DIAGNOSIS — B001 Herpesviral vesicular dermatitis: Secondary | ICD-10-CM

## 2021-12-10 NOTE — Progress Notes (Unsigned)
Edwin [name]   Follow-up Note  Referring Provider: Janith Lima, MD Primary Provider: Janith Lima, MD Date of Office Visit: 12/10/2021  Subjective:   Edwin [name] (DOB: 12-29-1940) is a 81 y.o. male who returns to the [facility] [place] on 12/10/2021 in re-evaluation of the following:  HPI: Edwin [name] returns to this clinic in evaluation of COPD/asthma overlap, bronchiectasis, right hemiparesis diaphragm, allergic rhinitis, chronic sinusitis, LPR, and history of herpes labialis.  His last visit to this clinic was 19 November 2021.  He was really doing well until 27 November 2021 at which point in time he became acutely ill while at the beach with temperature 100.7, chills, aches, coughing, dyspnea, and feeling as though he was beat with a bat.  He was given azithromycin and a low-dose of systemic steroids utilizing 20 mg of prednisone for 3 days and then subsequently has been using 10 mg of prednisone for 10 days.  He has 4 days left on his prednisone.  He did perform a home COVID test which was negative.  Currently, he has resolved his fever and he has resolved his aches and chills and a lot of his cough is improved but he is still pretty dyspneic and basically gets short of breath exerting himself to any significant degree.  He does have oxygen at home and has been using it at nighttime and says he also has been using it sometimes during the day.  This appears to help him when he uses oxygen.  Allergies as of 12/10/2021       Reactions   Ramipril Other (See Comments)   REACTION: chest congestion & cough. No angioedema        Medication List    acyclovir 200 MG capsule Commonly known as: Zovirax Take 1 capsule (200 mg total) by mouth 4 (four) times daily.   albuterol 108 (90 Base) MCG/ACT inhaler Commonly known as: Ventolin HFA Inhale 2 puffs into the lungs every 4 (four) hours as needed for wheezing or shortness of breath.  INHALE 1-2 PUFFS INTO THE LUNGS EVERY 4 HOURS AS NEEDED FOR WHEEZING OR SHORTNESS OF BREATH   allopurinol 300 MG tablet Commonly known as: ZYLOPRIM TAKE 1 TABLET BY MOUTH EVERY DAY   aspirin 81 MG chewable tablet Commonly known as: Aspirin Childrens Chew 1 tablet (81 mg total) by mouth daily.   atorvastatin 20 MG tablet Commonly known as: LIPITOR TAKE 1 TABLET BY MOUTH EVERY DAY   azithromycin 500 MG tablet Commonly known as: Zithromax Take 1 tablet once a day for 3 days   betamethasone dipropionate 0.05 % cream Apply topically.   Breztri Aerosphere 160-9-4.8 MCG/ACT Aero Generic drug: Budeson-Glycopyrrol-Formoterol Inhale 2 puffs into the lungs in the morning and at bedtime.   brimonidine 0.1 % Soln Commonly known as: ALPHAGAN P Place 1 drop into both eyes 2 (two) times daily. Both eyes   brimonidine 0.2 % ophthalmic solution Commonly known as: ALPHAGAN   dextromethorphan-guaiFENesin 30-600 MG 12hr tablet Commonly known as: MUCINEX DM Take 1 tablet by mouth 2 (two) times daily as needed for cough.   famotidine 20 MG tablet Commonly known as: PEPCID Take 1 tablet (20 mg total) by mouth at bedtime. TAKE 1 TABLET BY MOUTH EVERYDAY AT BEDTIME   FLUoxetine 10 MG capsule Commonly known as: PROZAC TAKE 1 CAPSULE BY MOUTH EVERY DAY   levocetirizine 5 MG tablet Commonly known as: XYZAL Take 1 tablet (5 mg  total) by mouth 2 (two) times daily as needed for allergies (Can take an extra dose during flare).   Lumigan 0.01 % Soln Generic drug: bimatoprost   mometasone 0.1 % ointment Commonly known as: ELOCON Apply topically once daily as needed   olmesartan 40 MG tablet Commonly known as: BENICAR TAKE 1 TABLET BY MOUTH EVERY DAY   pantoprazole 40 MG tablet Commonly known as: PROTONIX Take 1 tablet (40 mg total) by mouth in the morning. TAKE 1 TABLET BY MOUTH EVERY DAY IN THE MORNING   predniSONE 10 MG tablet Commonly known as: DELTASONE Take 1 tablet once a day for  10 days   psyllium 58.6 % powder Commonly known as: METAMUCIL Take 1 packet by mouth daily.   triamcinolone 55 MCG/ACT Aero nasal inhaler Commonly known as: NASACORT Place 1 spray into the nose daily.   VITAMIN B 12 PO Take 1 tablet by mouth daily.    Past Medical History:  Diagnosis Date   Asthma    COPD (chronic obstructive pulmonary disease) ([place])    reactive airway disease   Glaucoma    Gout    Hx of colonic polyps    Dr Earlean Shawl   Hyperlipidemia    NMR lipoprofile 2005: LDL 113(1416/825), HLD 37, TG 190.   Hypertension    Pneumonia     Past Surgical History:  Procedure Laterality Date   ANTERIOR CERVICAL DECOMP/DISCECTOMY FUSION N/A 03/08/2018   Procedure: ANTERIOR CERVICAL DECOMPRESSION/DISCECTOMY FUSION, INTERBODY PROSTHESIS,PLATE/SCREWS CERVICAL FOUR- CERVICAL FIVE, CERVICAL FIVE- CERVICAL SIX, CERVICAL SIX- CERVICAL SEVEN;  Surgeon: Newman Pies, MD;  Location: [facility];  Service: Neurosurgery;  Laterality: N/A;  anterior   CATARACT EXTRACTION     bilat, implants   [place]   POLYPECTOMY  2003    neg 2006, Dr.Medoff   tear duct surgery  1998   for excess tearing, skin graft L foot @ age 68 1/2    Review of systems negative except as noted in HPI / PMHx or noted below:  Review of Systems  Constitutional: Negative.   HENT: Negative.    Eyes: Negative.   Respiratory: Negative.    Cardiovascular: Negative.   Gastrointestinal: Negative.   Genitourinary: Negative.   Musculoskeletal: Negative.   Skin: Negative.   Neurological: Negative.   Endo/Heme/Allergies: Negative.   Psychiatric/Behavioral: Negative.       Objective:   Vitals:   12/10/21 1128  BP: 128/82  Pulse: 81  Temp: 98.1 F (36.7 C)  SpO2: 92%   Height: '5\' 8"'$  (172.7 cm)  Weight: 204 lb (92.5 kg)   Physical Exam Constitutional:      Appearance: He is not diaphoretic.  HENT:     Head: Normocephalic.     Right Ear: Tympanic membrane, ear canal  and external ear normal.     Left Ear: Tympanic membrane, ear canal and external ear normal.     Nose: Nose normal. No mucosal edema or rhinorrhea.     Mouth/Throat:     Pharynx: Uvula midline. No oropharyngeal exudate.  Eyes:     Conjunctiva/sclera: Conjunctivae normal.  Neck:     Thyroid: No thyromegaly.     Trachea: Trachea normal. No tracheal tenderness or tracheal deviation.  Cardiovascular:     Rate and Rhythm: Normal rate and regular rhythm.     Heart sounds: Normal heart sounds, S1 normal and S2 normal. No murmur heard. Pulmonary:     Effort: No respiratory distress.  Breath sounds: Normal breath sounds. No stridor. No wheezing or rales.  Lymphadenopathy:     Head:     Right side of head: No tonsillar adenopathy.     Left side of head: No tonsillar adenopathy.     Cervical: No cervical adenopathy.  Skin:    Findings: No erythema or rash.     Nails: There is no clubbing.  Neurological:     Mental Status: He is alert.     Diagnostics:    Spirometry was performed and demonstrated an FEV1 of 1.29 at 50 % of predicted.  Oxygen saturation at rest on room air was 93%.  Oxygen saturation walking the hallway on room air was 88%.  Assessment and Plan:   1. COPD with asthma ([place])   2. Bronchiectasis without complication (HCC)   3. Other allergic rhinitis   4. LPRD (laryngopharyngeal reflux disease)   5. Herpes labialis   6. Diaphragm dysfunction     1. Continue to Treat inflammation:   A. Breztri - 2 inhalations 2 times per day   B. Continue prednisone taper  2. Continue to Treat reflux / LPR:   A. Pantoprazole 40 mg in the a.m. + famotidine 20 mg in the p.m.  3. If needed:   A. ProAir HFA 2 puffs every 4-6 hours  B. nasal saline washes  C. OTC antihistamine  D. Mucinex DM 2 tablets twice a day  E. Duoneb nebulized every 4-6 hours if needed.  F. Nasacort one spray each nostril one time per day  G. Acyclovir 200 - 4 times a day during fever blister  outbreak  4.  Continue oxygen at 2 liters [place] administration  5.  Continue [employer] percussion vest  6.  Return to clinic in 6 months or earlier if problem  7.  Obtain fall flu vaccine and RSV vaccine.  Bijan appears to have developed a viral respiratory tract infection that has given rise to significant inflammation of his airway and fortunately he appears to be improving although is still somewhat dyspneic and has had an exacerbation of his exertional induced hypoxemia.  He will continue on anti-inflammatory agents for his airway and continue on therapy for reflux and will continue on oxygen supplementation.  At this point I am going to assume that he will continue to improve each day and within a week or 2 he will be back to baseline but if not he is going to contact me and we will have him undergo further evaluation and treatment.   Allena Katz, MD Allergy / Immunology [place]

## 2021-12-10 NOTE — Patient Instructions (Addendum)
   1. Continue to Treat inflammation:   A. Breztri - 2 inhalations 2 times per day   B. Continue prednisone taper  2. Continue to Treat reflux / LPR:   A. Pantoprazole 40 mg in the a.m. + famotidine 20 mg in the p.m.  3. If needed:   A. ProAir HFA 2 puffs every 4-6 hours  B. nasal saline washes  C. OTC antihistamine  D. Mucinex DM 2 tablets twice a day  E. Duoneb nebulized every 4-6 hours if needed.  F. Nasacort one spray each nostril one time per day  G. Acyclovir 200 - 4 times a day during fever blister outbreak  4.  Continue oxygen at 2 liters [place] administration  5.  Continue [employer] percussion vest  6.  Return to clinic in 6 months or earlier if problem  7.  Obtain fall flu vaccine and RSV vaccine.

## 2021-12-11 ENCOUNTER — Encounter: Payer: Self-pay | Admitting: Allergy and Immunology

## 2022-01-02 ENCOUNTER — Telehealth: Payer: Self-pay

## 2022-01-02 NOTE — Telephone Encounter (Signed)
Received continued medical need prescription renewal from [employer] on 01/01/22.  Form was completed and faxed back to [employer] with most recent office notes.

## 2022-01-20 ENCOUNTER — Telehealth: Payer: Self-pay | Admitting: *Deleted

## 2022-01-20 NOTE — Patient Outreach (Signed)
  Care Coordination   01/20/2022 Name: Edwin [name] MRN: [id number] DOB: [birth year]   Care Coordination Outreach Attempts:  An unsuccessful telephone outreach was attempted today to offer the patient information about available care coordination services as a benefit of their health plan.   Follow Up Plan:  Additional outreach attempts will be made to offer the patient care coordination information and services.   Encounter Outcome:  Pt. Request to Call Back  Care Coordination Interventions Activated:  No   Care Coordination Interventions:  No, not indicated    Emelia Loron RN, BSN [place] [phone] [name]

## 2022-01-21 ENCOUNTER — Telehealth: Payer: Self-pay | Admitting: *Deleted

## 2022-01-21 NOTE — Patient Outreach (Signed)
  Care Coordination   01/21/2022 Name: Edwin [name] MRN: [id number] DOB: 1941/03/21   Care Coordination Outreach Attempts:  Contact was made with the patient today to offer care coordination services as a benefit of their health plan. Patient declined services. Follow Up Plan:  No further outreach attempts will be made at this time.  Encounter Outcome:  Pt. Visit Completed  Care Coordination Interventions Activated:  No   Care Coordination Interventions:  No, not indicated    Emelia Loron RN, BSN [place] [phone] [email or web address]

## 2022-01-22 ENCOUNTER — Other Ambulatory Visit: Payer: Self-pay | Admitting: Internal Medicine

## 2022-01-22 DIAGNOSIS — M1000 Idiopathic gout, unspecified site: Secondary | ICD-10-CM

## 2022-01-22 DIAGNOSIS — E785 Hyperlipidemia, unspecified: Secondary | ICD-10-CM

## 2022-01-28 ENCOUNTER — Telehealth: Payer: Self-pay | Admitting: Allergy and Immunology

## 2022-01-28 ENCOUNTER — Telehealth: Payer: Self-pay | Admitting: *Deleted

## 2022-01-28 ENCOUNTER — Other Ambulatory Visit: Payer: Self-pay | Admitting: *Deleted

## 2022-01-28 MED ORDER — NIRMATRELVIR/RITONAVIR (PAXLOVID)TABLET: 3.0000 | ORAL_TABLET | Freq: Two times a day (BID) | ORAL | 0 refills | Status: AC

## 2022-01-28 MED ORDER — PREDNISONE 10 MG PO TABS: ORAL_TABLET | ORAL | 0 refills | Status: DC

## 2022-01-28 NOTE — Telephone Encounter (Signed)
Collie Siad called in and states that she picked up PAXLOVID the pharmacist suggested to her that Edwin [name] should cut the dose of his LIPITOR in half and Collie Siad wants to make sure with Dr. Neldon Mc this is ok to do.   Please advise.

## 2022-01-28 NOTE — Telephone Encounter (Signed)
Please inform Edwin [name] that there is no problem cutting his Lipitor in half and in fact he can discontinue his Lipitor for 5 days with no problem.

## 2022-01-28 NOTE — Telephone Encounter (Signed)
-----   Message from Jiles Prows, MD sent at 01/28/2022  8:08 AM EDT ----- Edwin [name] just called me and stated that he has swab positive COVID that started 24 hours ago.  He is very sick with head congestion and chest congestion and fever and fatigue.  Please call out Paxlovid.  Please call out prednisone 10 mg 1 tablet daily for 10 days.  Please call out these prescriptions ASAP.

## 2022-01-28 NOTE — Telephone Encounter (Signed)
RX sent to pharmacy  

## 2022-01-29 ENCOUNTER — Telehealth: Payer: Self-pay | Admitting: *Deleted

## 2022-01-29 NOTE — Telephone Encounter (Signed)
Called patient and advised. Patient verbalized understanding.  

## 2022-01-29 NOTE — Telephone Encounter (Signed)
Spoke to patient to inquire how he is doing and he advised feeling some better. He did advise that his wife Collie Siad now has [place] and she has tried to get Paxlovid through her PCP with a televisit but has twice been able to get online with MD.  I advised Dr Neldon Mc of same and hs advised me to call same in for her and advise her to check herr medications with pharmacist prior to starting the [place]. Patient and wife understood instructions and Rx called to [facility].

## 2022-01-31 ENCOUNTER — Ambulatory Visit: Payer: Medicare Other | Admitting: Podiatry

## 2022-02-13 ENCOUNTER — Other Ambulatory Visit: Payer: Self-pay | Admitting: Internal Medicine

## 2022-02-13 DIAGNOSIS — F3342 Major depressive disorder, recurrent, in full remission: Secondary | ICD-10-CM

## 2022-02-17 DIAGNOSIS — Z23 Encounter for immunization: Secondary | ICD-10-CM | POA: Diagnosis not present

## 2022-02-18 ENCOUNTER — Ambulatory Visit
Admission: RE | Admit: 2022-02-18 | Discharge: 2022-02-18 | Disposition: A | Discharge status: Home / Self Care | Payer: Medicare Other | Source: Ambulatory Visit | Attending: Allergy and Immunology | Admitting: Allergy and Immunology

## 2022-02-18 DIAGNOSIS — Z13820 Encounter for screening for osteoporosis: Secondary | ICD-10-CM | POA: Diagnosis not present

## 2022-02-18 DIAGNOSIS — J479 Bronchiectasis, uncomplicated: Secondary | ICD-10-CM

## 2022-02-18 DIAGNOSIS — M810 Age-related osteoporosis without current pathological fracture: Secondary | ICD-10-CM

## 2022-03-03 ENCOUNTER — Other Ambulatory Visit: Payer: Self-pay | Admitting: Internal Medicine

## 2022-03-03 DIAGNOSIS — F3342 Major depressive disorder, recurrent, in full remission: Secondary | ICD-10-CM

## 2022-03-04 ENCOUNTER — Ambulatory Visit (INDEPENDENT_AMBULATORY_CARE_PROVIDER_SITE_OTHER): Payer: Medicare Other | Admitting: Podiatry

## 2022-03-04 ENCOUNTER — Encounter: Payer: Self-pay | Admitting: Podiatry

## 2022-03-04 DIAGNOSIS — E118 Type 2 diabetes mellitus with unspecified complications: Secondary | ICD-10-CM | POA: Diagnosis not present

## 2022-03-04 DIAGNOSIS — M79674 Pain in right toe(s): Secondary | ICD-10-CM

## 2022-03-04 DIAGNOSIS — N1831 Chronic kidney disease, stage 3a: Secondary | ICD-10-CM

## 2022-03-04 DIAGNOSIS — M79675 Pain in left toe(s): Secondary | ICD-10-CM

## 2022-03-04 DIAGNOSIS — B351 Tinea unguium: Secondary | ICD-10-CM | POA: Diagnosis not present

## 2022-03-04 NOTE — Progress Notes (Signed)
This patient presents  to my office for at risk foot care.  This patient requires this care by a professional since this patient will be at risk due to having diabetes and stage 3a CKD.  This patient is unable to cut nails himself since the patient cannot reach his nails.These nails are painful walking and wearing shoes.   This patient presents for at risk foot care today.  General Appearance  Alert, conversant and in no acute stress.  Vascular  Dorsalis pedis and posterior tibial  pulses are palpable  bilaterally.  Capillary return is within normal limits  bilaterally. Temperature is within normal limits  bilaterally.  Neurologic  Senn-Weinstein monofilament wire test within normal limits  bilaterally. Muscle power within normal limits bilaterally.  Nails Thick disfigured discolored nails with subungual debris  from hallux to fifth toes bilaterally. No evidence of bacterial infection or drainage bilaterally.  Orthopedic  No limitations of motion  feet .  No crepitus or effusions noted.  No bony pathology or digital deformities noted. Mild  HAV  B/L.  DJD 1st  MPJ  B/L.  Skin  normotropic skin with no porokeratosis noted bilaterally.  No signs of infections or ulcers noted.     Onychomycosis  Pain in right toes  Pain in left toes  Consent was obtained for treatment procedures.   Mechanical debridement of nails 1-5  bilaterally performed with a nail nipper.  Filed with dremel without incident.    Return office visit  10 weeks                   Told patient to return for periodic foot care and evaluation due to potential at risk complications.   Adir Schicker DPM   

## 2022-03-13 DIAGNOSIS — Z961 Presence of intraocular lens: Secondary | ICD-10-CM | POA: Diagnosis not present

## 2022-03-13 DIAGNOSIS — H401132 Primary open-angle glaucoma, bilateral, moderate stage: Secondary | ICD-10-CM | POA: Diagnosis not present

## 2022-03-13 DIAGNOSIS — H53431 Sector or arcuate defects, right eye: Secondary | ICD-10-CM | POA: Diagnosis not present

## 2022-03-13 DIAGNOSIS — H353112 Nonexudative age-related macular degeneration, right eye, intermediate dry stage: Secondary | ICD-10-CM | POA: Diagnosis not present

## 2022-03-25 DIAGNOSIS — L570 Actinic keratosis: Secondary | ICD-10-CM | POA: Diagnosis not present

## 2022-03-25 DIAGNOSIS — L821 Other seborrheic keratosis: Secondary | ICD-10-CM | POA: Diagnosis not present

## 2022-03-25 DIAGNOSIS — X32XXXD Exposure to sunlight, subsequent encounter: Secondary | ICD-10-CM | POA: Diagnosis not present

## 2022-03-25 DIAGNOSIS — Z1283 Encounter for screening for malignant neoplasm of skin: Secondary | ICD-10-CM | POA: Diagnosis not present

## 2022-04-15 ENCOUNTER — Other Ambulatory Visit: Payer: Self-pay

## 2022-04-15 ENCOUNTER — Ambulatory Visit (INDEPENDENT_AMBULATORY_CARE_PROVIDER_SITE_OTHER): Payer: Medicare Other | Admitting: Allergy and Immunology

## 2022-04-15 ENCOUNTER — Encounter: Payer: Self-pay | Admitting: Allergy and Immunology

## 2022-04-15 VITALS — BP 142/68 | HR 103 | Temp 98.1°F | Resp 14 | Ht 68.0 in | Wt 209.3 lb

## 2022-04-15 DIAGNOSIS — J4489 Other specified chronic obstructive pulmonary disease: Secondary | ICD-10-CM | POA: Diagnosis not present

## 2022-04-15 DIAGNOSIS — B999 Unspecified infectious disease: Secondary | ICD-10-CM | POA: Diagnosis not present

## 2022-04-15 DIAGNOSIS — B001 Herpesviral vesicular dermatitis: Secondary | ICD-10-CM | POA: Diagnosis not present

## 2022-04-15 DIAGNOSIS — J986 Disorders of diaphragm: Secondary | ICD-10-CM

## 2022-04-15 DIAGNOSIS — K219 Gastro-esophageal reflux disease without esophagitis: Secondary | ICD-10-CM | POA: Diagnosis not present

## 2022-04-15 DIAGNOSIS — J479 Bronchiectasis, uncomplicated: Secondary | ICD-10-CM | POA: Diagnosis not present

## 2022-04-15 DIAGNOSIS — J3089 Other allergic rhinitis: Secondary | ICD-10-CM | POA: Diagnosis not present

## 2022-04-15 MED ORDER — PANTOPRAZOLE SODIUM 40 MG PO TBEC: 40.0000 mg | DELAYED_RELEASE_TABLET | Freq: Every morning | ORAL | 1 refills | Status: DC

## 2022-04-15 MED ORDER — IPRATROPIUM-ALBUTEROL 0.5-2.5 (3) MG/3ML IN SOLN: 3.0000 mL | Freq: Four times a day (QID) | RESPIRATORY_TRACT | 2 refills | Status: DC | PRN

## 2022-04-15 MED ORDER — AMOXICILLIN-POT CLAVULANATE 875-125 MG PO TABS: 1.0000 | ORAL_TABLET | Freq: Two times a day (BID) | ORAL | 0 refills | Status: AC

## 2022-04-15 MED ORDER — TRIAMCINOLONE ACETONIDE 55 MCG/ACT NA AERO: 1.0000 | INHALATION_SPRAY | Freq: Every day | NASAL | 5 refills | Status: DC

## 2022-04-15 MED ORDER — TRELEGY ELLIPTA 200-62.5-25 MCG/ACT IN AEPB: 1.0000 | INHALATION_SPRAY | Freq: Every day | RESPIRATORY_TRACT | 5 refills | Status: DC

## 2022-04-15 MED ORDER — FAMOTIDINE 20 MG PO TABS: 20.0000 mg | ORAL_TABLET | Freq: Every evening | ORAL | 1 refills | Status: DC

## 2022-04-15 MED ORDER — ALBUTEROL SULFATE HFA 108 (90 BASE) MCG/ACT IN AERS: 2.0000 | INHALATION_SPRAY | RESPIRATORY_TRACT | 1 refills | Status: DC | PRN

## 2022-04-15 MED ORDER — PREDNISONE 10 MG PO TABS: ORAL_TABLET | ORAL | 0 refills | Status: DC

## 2022-04-15 NOTE — Patient Instructions (Addendum)
   1. Continue to Treat inflammation:   A.  Replace Breztri with Trelegy 200 -1 inhalation 1 time per day (empty lungs)  2. Continue to Treat reflux / LPR:   A. Pantoprazole 40 mg in the a.m. + famotidine 20 mg in the p.m.  3. If needed:   A. ProAir HFA 2 puffs every 4-6 hours  B. nasal saline washes  C. OTC antihistamine  D. Mucinex DM 2 tablets twice a day  E. Duoneb nebulized every 4-6 hours if needed.  F. Nasacort one spray each nostril one time per day  G. Acyclovir 200 - 4 times a day during fever blister outbreak  4.  Continue oxygen at 2 liters [place] administration  5.  Continue Respirtech percussion vest  6.  Obtain blood - IgA/G/M, IgE, antipneumococcal ab, antitetanus ab, CBC w/d  7.  Treat recent event with the following:   A. Prednisone 10 mg -1 tablet 2x/day for 5 days, then 1x/day for 5 days  B.  Augmentin 875 -1 tablet 2 times per day for 10 days  8.  Return to clinic in 6 months or earlier if problem  9.  Obtain fall flu vaccine and RSV vaccine.

## 2022-04-15 NOTE — Progress Notes (Unsigned)
[facility]   Follow-up Note  Referring Provider: Janith Lima, MD Primary Provider: Janith Lima, MD Date of Office Visit: 04/15/2022  Subjective:   Edwin [name] (DOB: February 14, 1941) is a 81 y.o. male who returns to the [facility] on 04/15/2022 in re-evaluation of the following:  HPI: Salil turns to this clinic in evaluation of COPD/asthma overlap, bronchiectasis, right hemiparesis diaphragm, allergic rhinitis, chronic sinusitis, LPR, and history of herpes labialis.  His last visit to this clinic was 10 December 2021.  He developed COVID in August 2023 which we treated with Paxlovid and he did better but he never completely bounced back and continue Toprol wheezing and some chest congestion and over the course of the past 2 weeks has become much worse.  He thinks that the last 2-week event might have started with a head cold but he is definitely had ugly nasal discharge although he still can smell.  He is definitely had coughing and wheezing but no chest pain.  He does not use his oxygen on a regular basis.  He only uses it "when he needs it".  He is not sleeping with his oxygen.  He has received the flu vaccine and the RSV vaccine.  Allergies as of 04/15/2022       Reactions   Ramipril Other (See Comments)   REACTION: chest congestion & cough. No angioedema        Medication List    acyclovir 200 MG capsule Commonly known as: Zovirax Take 1 capsule (200 mg total) by mouth 4 (four) times daily.   albuterol 108 (90 Base) MCG/ACT inhaler Commonly known as: Ventolin HFA Inhale 2 puffs into the lungs every 4 (four) hours as needed for wheezing or shortness of breath. INHALE 1-2 PUFFS INTO THE LUNGS EVERY 4 HOURS AS NEEDED FOR WHEEZING OR SHORTNESS OF BREATH   allopurinol 300 MG tablet Commonly known as: ZYLOPRIM TAKE 1 TABLET BY MOUTH EVERY DAY   aspirin 81 MG chewable tablet Commonly known as: Aspirin  Childrens Chew 1 tablet (81 mg total) by mouth daily.   atorvastatin 20 MG tablet Commonly known as: LIPITOR TAKE 1 TABLET BY MOUTH EVERY DAY   azithromycin 500 MG tablet Commonly known as: Zithromax Take 1 tablet once a day for 3 days   betamethasone dipropionate 0.05 % cream Apply topically.   Breztri Aerosphere 160-9-4.8 MCG/ACT Aero Generic drug: Budeson-Glycopyrrol-Formoterol Inhale 2 puffs into the lungs in the morning and at bedtime.   brimonidine 0.1 % Soln Commonly known as: ALPHAGAN P Place 1 drop into both eyes 2 (two) times daily. Both eyes   brimonidine 0.2 % ophthalmic solution Commonly known as: ALPHAGAN   dextromethorphan-guaiFENesin 30-600 MG 12hr tablet Commonly known as: MUCINEX DM Take 1 tablet by mouth 2 (two) times daily as needed for cough.   famotidine 20 MG tablet Commonly known as: PEPCID Take 1 tablet (20 mg total) by mouth at bedtime. TAKE 1 TABLET BY MOUTH EVERYDAY AT BEDTIME   FLUoxetine 10 MG capsule Commonly known as: PROZAC TAKE 1 CAPSULE BY MOUTH EVERY DAY   levocetirizine 5 MG tablet Commonly known as: XYZAL Take 1 tablet (5 mg total) by mouth 2 (two) times daily as needed for allergies (Can take an extra dose during flare).   Lumigan 0.01 % Soln Generic drug: bimatoprost   mometasone 0.1 % ointment Commonly known as: ELOCON Apply topically once daily as needed   olmesartan 40  MG tablet Commonly known as: BENICAR TAKE 1 TABLET BY MOUTH EVERY DAY   pantoprazole 40 MG tablet Commonly known as: PROTONIX Take 1 tablet (40 mg total) by mouth in the morning. TAKE 1 TABLET BY MOUTH EVERY DAY IN THE MORNING   psyllium 58.6 % powder Commonly known as: METAMUCIL Take 1 packet by mouth daily.   triamcinolone 55 MCG/ACT Aero nasal inhaler Commonly known as: NASACORT Place 1 spray into the nose daily.   VITAMIN B 12 PO Take 1 tablet by mouth daily.        Past Medical History:  Diagnosis Date   Asthma    COPD (chronic  obstructive pulmonary disease) ([place])    reactive airway disease   Glaucoma    Gout    Hx of colonic polyps    Dr Earlean Shawl   Hyperlipidemia    NMR lipoprofile 2005: LDL 113(1416/825), HLD 37, TG 190.   Hypertension    Pneumonia     Past Surgical History:  Procedure Laterality Date   ANTERIOR CERVICAL DECOMP/DISCECTOMY FUSION N/A 03/08/2018   Procedure: ANTERIOR CERVICAL DECOMPRESSION/DISCECTOMY FUSION, INTERBODY PROSTHESIS,PLATE/SCREWS CERVICAL FOUR- CERVICAL FIVE, CERVICAL FIVE- CERVICAL SIX, CERVICAL SIX- CERVICAL SEVEN;  Surgeon: Newman Pies, MD;  Location: [facility];  Service: Neurosurgery;  Laterality: N/A;  anterior   CATARACT EXTRACTION     bilat, implants   [place]   POLYPECTOMY  2003    neg 2006, Dr.Medoff   tear duct surgery  1998   for excess tearing, skin graft L foot @ age 65 1/2    Review of systems negative except as noted in HPI / PMHx or noted below:  Review of Systems  Constitutional: Negative.   HENT: Negative.    Eyes: Negative.   Respiratory: Negative.    Cardiovascular: Negative.   Gastrointestinal: Negative.   Genitourinary: Negative.   Musculoskeletal: Negative.   Skin: Negative.   Neurological: Negative.   Endo/Heme/Allergies: Negative.   Psychiatric/Behavioral: Negative.       Objective:   Vitals:   04/15/22 1343  BP: (!) 142/68  Pulse: (!) 103  Resp: 14  Temp: 98.1 F (36.7 C)  SpO2: 93%   Height: '5\' 8"'$  (172.7 cm)  Weight: 209 lb 4.8 oz (94.9 kg)   Physical Exam Constitutional:      Appearance: He is not diaphoretic.     Comments: Tachypneic  HENT:     Head: Normocephalic.     Right Ear: Tympanic membrane, ear canal and external ear normal.     Left Ear: Tympanic membrane, ear canal and external ear normal.     Nose: Nose normal. No mucosal edema or rhinorrhea.     Mouth/Throat:     Pharynx: Uvula midline. No oropharyngeal exudate.  Eyes:     Conjunctiva/sclera: Conjunctivae normal.   Neck:     Thyroid: No thyromegaly.     Trachea: Trachea normal. No tracheal tenderness or tracheal deviation.  Cardiovascular:     Rate and Rhythm: Normal rate and regular rhythm.     Heart sounds: Normal heart sounds, S1 normal and S2 normal. No murmur heard. Pulmonary:     Effort: No respiratory distress.     Breath sounds: No stridor. Wheezing (Lateral expiratory wheezing all lung Babineaux) present. No rales.  Lymphadenopathy:     Head:     Right side of head: No tonsillar adenopathy.     Left side of head: No tonsillar adenopathy.     Cervical:  No cervical adenopathy.  Skin:    Findings: No erythema or rash.     Nails: There is no clubbing.  Neurological:     Mental Status: He is alert.     Diagnostics:    Spirometry was performed and demonstrated an FEV1 of 1.49 at 57 % of predicted.  The patient had an Asthma Control Test with the following results: ACT Total Score: 12.    Assessment and Plan:   1. COPD with asthma   2. Bronchiectasis without complication (HCC)   3. Other allergic rhinitis   4. LPRD (laryngopharyngeal reflux disease)   5. Herpes labialis   6. Diaphragm dysfunction   7. Recurrent infections    1. Continue to Treat inflammation:   A.  Replace Breztri with Trelegy 200 -1 inhalation 1 time per day (empty lungs)  2. Continue to Treat reflux / LPR:   A. Pantoprazole 40 mg in the a.m. + famotidine 20 mg in the p.m.  3. If needed:   A. ProAir HFA 2 puffs every 4-6 hours  B. nasal saline washes  C. OTC antihistamine  D. Mucinex DM 2 tablets twice a day  E. Duoneb nebulized every 4-6 hours if needed.  F. Nasacort one spray each nostril one time per day  G. Acyclovir 200 - 4 times a day during fever blister outbreak  4.  Continue oxygen at 2 liters [place] administration  5.  Continue Respirtech percussion vest  6.  Obtain blood - IgA/G/M, IgE, antipneumococcal ab, antitetanus ab, CBC w/d  7.  Treat recent event with the following:   A.  Prednisone 10 mg -1 tablet 2x/day for 5 days, then 1x/day for 5 days  B.  Augmentin 875 -1 tablet 2 times per day for 10 days  8.  Return to clinic in 6 months or earlier if problem  9.  Obtain fall flu vaccine and RSV vaccine.  Kelli once again has an inflammatory flare of his respiratory tract and this appears to be precipitated by a respiratory tract infection and we will treat him with the therapy noted above which includes a systemic steroid and a broad-spectrum antibiotic while he continues on a large collection of anti-inflammatory agents for his airway therapy directed against reflux.  And, we will evaluate him for a B-cell immune defect as he did have a distant history of borderline hypogammaglobulinemia.  I will contact him with the results of his blood test once they are available for review.   Allena Katz, MD Allergy / Immunology [facility]

## 2022-04-16 ENCOUNTER — Encounter: Payer: Self-pay | Admitting: Allergy and Immunology

## 2022-04-17 ENCOUNTER — Other Ambulatory Visit: Payer: Self-pay | Admitting: *Deleted

## 2022-04-17 MED ORDER — IPRATROPIUM-ALBUTEROL 0.5-2.5 (3) MG/3ML IN SOLN: 3.0000 mL | Freq: Four times a day (QID) | RESPIRATORY_TRACT | 2 refills | Status: DC | PRN

## 2022-04-18 LAB — CBC WITH DIFFERENTIAL
Basophils Absolute: 0.1 10*3/uL (ref 0.0–0.2)
Basos: 1 %
EOS (ABSOLUTE): 0.1 10*3/uL (ref 0.0–0.4)
Eos: 1 %
Hematocrit: 46.6 % (ref 37.5–51.0)
Hemoglobin: 15.7 g/dL (ref 13.0–17.7)
Immature Grans (Abs): 0 10*3/uL (ref 0.0–0.1)
Immature Granulocytes: 0 %
Lymphocytes Absolute: 2.5 10*3/uL (ref 0.7–3.1)
Lymphs: 26 %
MCH: 32.8 pg (ref 26.6–33.0)
MCHC: 33.7 g/dL (ref 31.5–35.7)
MCV: 97 fL (ref 79–97)
Monocytes Absolute: 0.7 10*3/uL (ref 0.1–0.9)
Monocytes: 7 %
Neutrophils Absolute: 6.1 10*3/uL (ref 1.4–7.0)
Neutrophils: 65 %
RBC: 4.79 x10E6/uL (ref 4.14–5.80)
RDW: 14.2 % (ref 11.6–15.4)
WBC: 9.4 10*3/uL (ref 3.4–10.8)

## 2022-04-18 LAB — STREP PNEUMONIAE 23 SEROTYPES IGG
Pneumo Ab Type 1*: 5.3 ug/mL (ref >1.3)
Pneumo Ab Type 12 (12F)*: 2.7 ug/mL (ref >1.3)
Pneumo Ab Type 14*: 7.7 ug/mL (ref >1.3)
Pneumo Ab Type 17 (17F)*: 4.4 ug/mL (ref >1.3)
Pneumo Ab Type 19 (19F)*: 22 ug/mL (ref >1.3)
Pneumo Ab Type 2*: 15.3 ug/mL (ref >1.3)
Pneumo Ab Type 20*: 12.7 ug/mL (ref >1.3)
Pneumo Ab Type 22 (22F)*: 4.2 ug/mL (ref >1.3)
Pneumo Ab Type 23 (23F)*: 0.9 ug/mL — ABNORMAL LOW (ref >1.3)
Pneumo Ab Type 26 (6B)*: 6.7 ug/mL (ref >1.3)
Pneumo Ab Type 3*: 1.5 ug/mL (ref >1.3)
Pneumo Ab Type 34 (10A)*: 8.3 ug/mL (ref >1.3)
Pneumo Ab Type 4*: 0.4 ug/mL — ABNORMAL LOW (ref >1.3)
Pneumo Ab Type 43 (11A)*: >7.6 ug/mL (ref >1.3)
Pneumo Ab Type 5*: 27.6 ug/mL (ref >1.3)
Pneumo Ab Type 51 (7F)*: 1.4 ug/mL (ref >1.3)
Pneumo Ab Type 54 (15B)*: >22 ug/mL (ref >1.3)
Pneumo Ab Type 56 (18C)*: 5.9 ug/mL (ref >1.3)
Pneumo Ab Type 57 (19A)*: 3.8 ug/mL (ref >1.3)
Pneumo Ab Type 68 (9V)*: 6.1 ug/mL (ref >1.3)
Pneumo Ab Type 70 (33F)*: 5.4 ug/mL (ref >1.3)
Pneumo Ab Type 8*: 4.4 ug/mL (ref >1.3)
Pneumo Ab Type 9 (9N)*: 10.8 ug/mL (ref >1.3)

## 2022-04-18 LAB — IGE: IgE (Immunoglobulin E), Serum: 280 IU/mL (ref 6–495)

## 2022-04-18 LAB — DIPHTHERIA / TETANUS ANTIBODY PANEL
Diphtheria Ab: 0.92 IU/mL (ref <0.10)
Tetanus Ab, IgG: 0.55 IU/mL (ref <0.10)

## 2022-04-18 LAB — IGG, IGA, IGM
IgA/Immunoglobulin A, Serum: 200 mg/dL (ref 61–437)
IgG (Immunoglobin G), Serum: 837 mg/dL (ref 603–1613)
IgM (Immunoglobulin M), Srm: 38 mg/dL (ref 15–143)

## 2022-04-24 ENCOUNTER — Other Ambulatory Visit: Payer: Self-pay | Admitting: Internal Medicine

## 2022-04-24 DIAGNOSIS — E118 Type 2 diabetes mellitus with unspecified complications: Secondary | ICD-10-CM

## 2022-04-24 DIAGNOSIS — I10 Essential (primary) hypertension: Secondary | ICD-10-CM

## 2022-05-07 ENCOUNTER — Telehealth: Payer: Self-pay | Admitting: Allergy and Immunology

## 2022-05-07 NOTE — Telephone Encounter (Signed)
Edwin [name] (wife) called in and states she is really concerned about Edwin [name]. She states that she was at a yacht club back on November 17th and [name] couldn't catch his breath and was coughing.  Edwin [name] decided to bring him home and had him take his nebulizer treatment.  Edwin [name] stated one of the doctors at the [employer] mentioned he was concerned about his health.  Edwin [name] states that [employer] just isn't himself.   Edwin [name] states that he is fatigued, depressed, coughing, and just can't catch his breath.  Edwin [name] stated that [place] uses his nebulizer a couple of times a day but feels he should use it more.  They did make an appointment for December 12th but states they would come be seen sooner if need be.  Please advise.

## 2022-05-08 ENCOUNTER — Other Ambulatory Visit: Payer: Self-pay | Admitting: *Deleted

## 2022-05-08 DIAGNOSIS — R053 Chronic cough: Secondary | ICD-10-CM

## 2022-05-08 DIAGNOSIS — R0602 Shortness of breath: Secondary | ICD-10-CM

## 2022-05-08 NOTE — Telephone Encounter (Signed)
Wife informed and order for xray sent to [facility] at [address].

## 2022-05-09 ENCOUNTER — Ambulatory Visit
Admission: RE | Admit: 2022-05-09 | Discharge: 2022-05-09 | Disposition: A | Discharge status: Home / Self Care | Payer: Medicare Other | Source: Ambulatory Visit | Attending: Allergy and Immunology | Admitting: Allergy and Immunology

## 2022-05-09 DIAGNOSIS — R059 Cough, unspecified: Secondary | ICD-10-CM | POA: Diagnosis not present

## 2022-05-09 DIAGNOSIS — R0602 Shortness of breath: Secondary | ICD-10-CM | POA: Diagnosis not present

## 2022-05-12 ENCOUNTER — Ambulatory Visit (INDEPENDENT_AMBULATORY_CARE_PROVIDER_SITE_OTHER): Payer: Medicare Other | Admitting: Podiatry

## 2022-05-12 VITALS — BP 138/78

## 2022-05-12 DIAGNOSIS — M79674 Pain in right toe(s): Secondary | ICD-10-CM

## 2022-05-12 DIAGNOSIS — M79675 Pain in left toe(s): Secondary | ICD-10-CM

## 2022-05-12 DIAGNOSIS — E118 Type 2 diabetes mellitus with unspecified complications: Secondary | ICD-10-CM

## 2022-05-12 DIAGNOSIS — B351 Tinea unguium: Secondary | ICD-10-CM

## 2022-05-12 DIAGNOSIS — N1831 Chronic kidney disease, stage 3a: Secondary | ICD-10-CM

## 2022-05-12 NOTE — Progress Notes (Signed)
This patient presents  to my office for at risk foot care.  This patient requires this care by a professional since this patient will be at risk due to having diabetes and stage 3a CKD.  This patient is unable to cut nails himself since the patient cannot reach his nails.These nails are painful walking and wearing shoes.   This patient presents for at risk foot care today.  General Appearance  Alert, conversant and in no acute stress.  Vascular  Dorsalis pedis and posterior tibial  pulses are palpable  bilaterally.  Capillary return is within normal limits  bilaterally. Temperature is within normal limits  bilaterally.  Neurologic  Senn-Weinstein monofilament wire test within normal limits  bilaterally. Muscle power within normal limits bilaterally.  Nails Thick disfigured discolored nails with subungual debris  from hallux to fifth toes bilaterally. No evidence of bacterial infection or drainage bilaterally.  Orthopedic  No limitations of motion  feet .  No crepitus or effusions noted.  No bony pathology or digital deformities noted. Mild  HAV  B/L.  DJD 1st  MPJ  B/L.  Skin  normotropic skin with no porokeratosis noted bilaterally.  No signs of infections or ulcers noted.     Onychomycosis  Pain in right toes  Pain in left toes  Consent was obtained for treatment procedures.   Mechanical debridement of nails 1-5  bilaterally performed with a nail nipper.  Filed with dremel without incident.    Return office visit  10 weeks                   Told patient to return for periodic foot care and evaluation due to potential at risk complications.   Naje Rice DPM   

## 2022-05-13 ENCOUNTER — Ambulatory Visit (INDEPENDENT_AMBULATORY_CARE_PROVIDER_SITE_OTHER): Payer: Medicare Other | Admitting: Allergy and Immunology

## 2022-05-13 VITALS — BP 132/86 | HR 75 | Temp 97.7°F | Resp 16 | Ht 68.0 in | Wt 206.1 lb

## 2022-05-13 DIAGNOSIS — K219 Gastro-esophageal reflux disease without esophagitis: Secondary | ICD-10-CM

## 2022-05-13 DIAGNOSIS — J4489 Other specified chronic obstructive pulmonary disease: Secondary | ICD-10-CM

## 2022-05-13 DIAGNOSIS — J3089 Other allergic rhinitis: Secondary | ICD-10-CM

## 2022-05-13 DIAGNOSIS — B001 Herpesviral vesicular dermatitis: Secondary | ICD-10-CM | POA: Diagnosis not present

## 2022-05-13 DIAGNOSIS — J986 Disorders of diaphragm: Secondary | ICD-10-CM

## 2022-05-13 DIAGNOSIS — J479 Bronchiectasis, uncomplicated: Secondary | ICD-10-CM

## 2022-05-13 MED ORDER — PREDNISONE 10 MG PO TABS: ORAL_TABLET | ORAL | 0 refills | Status: DC

## 2022-05-13 MED ORDER — ALBUTEROL SULFATE HFA 108 (90 BASE) MCG/ACT IN AERS: 2.0000 | INHALATION_SPRAY | RESPIRATORY_TRACT | 1 refills | Status: DC | PRN

## 2022-05-13 MED ORDER — FAMOTIDINE 40 MG PO TABS: 40.0000 mg | ORAL_TABLET | Freq: Every day | ORAL | 1 refills | Status: DC

## 2022-05-13 MED ORDER — TRELEGY ELLIPTA 200-62.5-25 MCG/ACT IN AEPB: 1.0000 | INHALATION_SPRAY | Freq: Every day | RESPIRATORY_TRACT | 5 refills | Status: DC

## 2022-05-13 MED ORDER — LEVOCETIRIZINE DIHYDROCHLORIDE 5 MG PO TABS: 5.0000 mg | ORAL_TABLET | Freq: Every day | ORAL | 1 refills | Status: DC | PRN

## 2022-05-13 MED ORDER — PANTOPRAZOLE SODIUM 40 MG PO TBEC: 40.0000 mg | DELAYED_RELEASE_TABLET | Freq: Every morning | ORAL | 1 refills | Status: DC

## 2022-05-13 NOTE — Patient Instructions (Signed)
   1. Continue to Treat inflammation:   A.  Replace Breztri with Trelegy 200 -1 inhalation 1 time per day (empty lungs)  B.  Prednisone 10 mg - 1 tab daily x 14 days, then 1/2 tab daily x 14 days  2. Continue to Treat reflux / LPR:   A. Pantoprazole 40 mg in the a.m. + famotidine 40 mg in the p.m.  3. If needed:   A. ProAir HFA 2 puffs every 4-6 hours  B. nasal saline washes  C. OTC antihistamine  D. Mucinex DM 2 tablets twice a day  E. Duoneb nebulized every 4-6 hours if needed.  F. Nasacort one spray each nostril one time per day  G. Acyclovir 200 - 4 times a day during fever blister outbreak  4.  Continue oxygen at 2 liters [place] administration  5.  Continue Respirtech percussion vest  6.  Return to clinic in 4 weeks or earlier if problem

## 2022-05-13 NOTE — Progress Notes (Unsigned)
[facility]   Follow-up Note  Referring Provider: Janith Lima, MD Primary Provider: Janith Lima, MD Date of Office Visit: 05/13/2022  Subjective:   Edwin [name] (DOB: Oct 10, 1940) is a 81 y.o. male who returns to the [facility] on 05/13/2022 in re-evaluation of the following:  HPI: Greene returns to this clinic in evaluation of COPD/asthma overlap, bronchiectasis, right hemiparesis diaphragm, allergic rhinitis, chronic sinusitis, LPR, and history of intermittent herpes labialis.  I last saw him in this clinic on 15 April 2022.  When I last saw him in this clinic he was having an acute exacerbation of respiratory tract inflammation with lots of cough with some ugly nasal discharge and inability to smell.  We started him on Augmentin and prednisone and he is definitely better.  However, he still continues to cough and most of his cough is now reserved during the daytime as he can sleep through the night without any respiratory tract symptoms.  And he still has a lot of dyspnea on exertion.  This is also an intermittent issue.  Sometimes he can exert himself there are problems sometimes he just gets out of breath.  He does not exercise any significant amount.  Short acting bronchodilator does not appear to be taking care of this issue.  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  He has not been having any issues with reflux.  Allergies as of 05/13/2022       Reactions   Ramipril Other (See Comments)   REACTION: chest congestion & cough. No angioedema        Medication List    acyclovir 200 MG capsule Commonly known as: Zovirax Take 1 capsule (200 mg total) by mouth 4 (four) times daily.   albuterol 108 (90 Base) MCG/ACT inhaler Commonly known as: Ventolin  HFA Inhale 2 puffs into the lungs every 4 (four) hours as needed for wheezing or shortness of breath. INHALE 1-2 PUFFS INTO THE LUNGS EVERY 4 HOURS AS NEEDED FOR WHEEZING OR SHORTNESS OF BREATH   allopurinol 300 MG tablet Commonly known as: ZYLOPRIM TAKE 1 TABLET BY MOUTH EVERY DAY   aspirin 81 MG chewable tablet Commonly known as: Aspirin Childrens Chew 1 tablet (81 mg total) by mouth daily.   atorvastatin 20 MG tablet Commonly known as: LIPITOR TAKE 1 TABLET BY MOUTH EVERY DAY   azithromycin 500 MG tablet Commonly known as: Zithromax Take 1 tablet once a day for 3 days   betamethasone dipropionate 0.05 % cream Apply topically.   brimonidine 0.1 % Soln Commonly known as: ALPHAGAN P Place 1 drop into both eyes 2 (two) times daily. Both eyes   brimonidine 0.2 % ophthalmic solution Commonly known as: ALPHAGAN   dextromethorphan-guaiFENesin 30-600 MG 12hr tablet Commonly known as: MUCINEX DM Take 1 tablet by mouth 2 (two) times daily as needed for cough.   famotidine 40 MG tablet Commonly known as: PEPCID Take 1 tablet (40 mg total) by mouth at bedtime.   FLUoxetine 10 MG capsule Commonly known as: PROZAC TAKE 1 CAPSULE BY MOUTH EVERY DAY   ipratropium-albuterol 0.5-2.5 (3) MG/3ML Soln Commonly known as: DUONEB Take 3 mLs by nebulization every 6 (six) hours as needed.   levocetirizine 5 MG tablet Commonly known as: XYZAL Take 1 tablet (5 mg  total) by mouth daily as needed for allergies (Can take an extra dose during flare).   Lumigan 0.01 % Soln Generic drug: bimatoprost   mometasone 0.1 % ointment Commonly known as: ELOCON Apply topically once daily as needed   olmesartan 40 MG tablet Commonly known as: BENICAR TAKE 1 TABLET BY MOUTH EVERY DAY   pantoprazole 40 MG tablet Commonly known as: PROTONIX Take 1 tablet (40 mg total) by mouth in the morning. TAKE 1 TABLET BY MOUTH EVERY DAY IN THE MORNING   predniSONE 10 MG tablet Commonly known as: DELTASONE 1  tab daily x 14 days, then 1/2 tab daily x 14 days What changed: additional instructions   psyllium 58.6 % powder Commonly known as: METAMUCIL Take 1 packet by mouth daily.   Trelegy Ellipta 200-62.5-25 MCG/ACT Aepb Generic drug: Fluticasone-Umeclidin-Vilant Inhale 1 Inhalation into the lungs daily. What changed: when to take this   triamcinolone 55 MCG/ACT Aero nasal inhaler Commonly known as: NASACORT Place 1 spray into the nose daily.   VITAMIN B 12 PO Take 1 tablet by mouth daily.    Past Medical History:  Diagnosis Date   Asthma    COPD (chronic obstructive pulmonary disease) ([place])    reactive airway disease   Glaucoma    Gout    Hx of colonic polyps    Dr Earlean Shawl   Hyperlipidemia    NMR lipoprofile 2005: LDL 113(1416/825), HLD 37, TG 190.   Hypertension    Pneumonia     Past Surgical History:  Procedure Laterality Date   ANTERIOR CERVICAL DECOMP/DISCECTOMY FUSION N/A 03/08/2018   Procedure: ANTERIOR CERVICAL DECOMPRESSION/DISCECTOMY FUSION, INTERBODY PROSTHESIS,PLATE/SCREWS CERVICAL FOUR- CERVICAL FIVE, CERVICAL FIVE- CERVICAL SIX, CERVICAL SIX- CERVICAL SEVEN;  Surgeon: Newman Pies, MD;  Location: [facility];  Service: Neurosurgery;  Laterality: N/A;  anterior   CATARACT EXTRACTION     bilat, implants   [place]   POLYPECTOMY  2003    neg 2006, Dr.Medoff   tear duct surgery  1998   for excess tearing, skin graft L foot @ age 89 1/2    Review of systems negative except as noted in HPI / PMHx or noted below:  Review of Systems  Constitutional: Negative.   HENT: Negative.    Eyes: Negative.   Respiratory: Negative.    Cardiovascular: Negative.   Gastrointestinal: Negative.   Genitourinary: Negative.   Musculoskeletal: Negative.   Skin: Negative.   Neurological: Negative.   Endo/Heme/Allergies: Negative.   Psychiatric/Behavioral: Negative.       Objective:   Vitals:   05/13/22 1003  BP: 132/86  Pulse: 75   Resp: 16  Temp: 97.7 F (36.5 C)  SpO2: 94%   Height: '5\' 8"'$  (172.7 cm)  Weight: 206 lb 1.6 oz (93.5 kg)   Physical Exam Constitutional:      Appearance: He is not diaphoretic.     Comments: coughing  HENT:     Head: Normocephalic.     Right Ear: Tympanic membrane, ear canal and external ear normal.     Left Ear: Tympanic membrane, ear canal and external ear normal.     Nose: Nose normal. No mucosal edema or rhinorrhea.     Mouth/Throat:     Pharynx: Uvula midline. No oropharyngeal exudate.  Eyes:     Conjunctiva/sclera: Conjunctivae normal.  Neck:     Thyroid: No thyromegaly.     Trachea: Trachea normal. No tracheal tenderness or tracheal deviation.  Cardiovascular:  Rate and Rhythm: Normal rate and regular rhythm.     Heart sounds: Normal heart sounds, S1 normal and S2 normal. No murmur heard. Pulmonary:     Effort: No respiratory distress.     Breath sounds: No stridor. Wheezing (Course expiratory wheezing involving chest and throat.) present. No rales.  Lymphadenopathy:     Head:     Right side of head: No tonsillar adenopathy.     Left side of head: No tonsillar adenopathy.     Cervical: No cervical adenopathy.  Skin:    Findings: No erythema or rash.     Nails: There is no clubbing.  Neurological:     Mental Status: He is alert.     Diagnostics:    Spirometry was performed and demonstrated an FEV1 of 1.44 at 55 % of predicted.  Results of blood tests obtained 15 April 2022 identifies IgG 837 MGs/DL, IgA 200 mg/DL, IgM 38 MGs/DL, IgE 280 KU/L, high titers of antipneumococcal antibodies on a Pneumo 23 serotype assay, tetanus IgG 0.55U/mL, WBC 9.4, absolute eosinophil 100, absolute lymphocyte 2500, hemoglobin 15.7  Results of chest x-ray obtained 09 May 2022 identified the following:  Similar to slightly increased asymmetric elevation of the right hemidiaphragm. Normal left lung volume. No focal consolidations. Again seen are subpleural reticulations  in the right lung. No pleural effusion or pneumothorax. The heart size and mediastinal contours are within normal limits. Partially imaged cervical spinal fixation hardware appears intact.  Assessment and Plan:   1. COPD with asthma   2. Bronchiectasis without complication (HCC)   3. Other allergic rhinitis   4. LPRD (laryngopharyngeal reflux disease)   5. Diaphragm dysfunction   6. Herpes labialis     1. Continue to Treat inflammation:   A.  Replace Breztri with Trelegy 200 -1 inhalation 1 time per day (empty lungs)  B.  Prednisone 10 mg - 1 tab daily x 14 days, then 1/2 tab daily x 14 days  2. Continue to Treat reflux / LPR:   A. Pantoprazole 40 mg in the a.m. + famotidine 40 mg in the p.m.  3. If needed:   A. ProAir HFA 2 puffs every 4-6 hours  B. nasal saline washes  C. OTC antihistamine  D. Mucinex DM 2 tablets twice a day  E. Duoneb nebulized every 4-6 hours if needed.  F. Nasacort one spray each nostril one time per day  G. Acyclovir 200 - 4 times a day during fever blister outbreak  4.  Continue oxygen at 2 liters [place] administration  5.  Continue Respirtech percussion vest  6.  Return to clinic in 4 weeks or earlier if problem  Although Zane is better he is not completely cleared out the mucus production in his airway and I am going to give him a low-dose of slow taper steroids over the course of the next month while he remains on a collection of anti-inflammatory agents for his airway and also continues to treat the issue with reflux.  Assuming he does well with this plan I will see him back in this clinic in 4 weeks or earlier if there is a problem.  Allena Katz, MD Allergy / Immunology [place]

## 2022-05-14 ENCOUNTER — Encounter: Payer: Self-pay | Admitting: Allergy and Immunology

## 2022-05-27 ENCOUNTER — Other Ambulatory Visit: Payer: Self-pay | Admitting: Internal Medicine

## 2022-05-27 DIAGNOSIS — F3342 Major depressive disorder, recurrent, in full remission: Secondary | ICD-10-CM

## 2022-05-27 DIAGNOSIS — E118 Type 2 diabetes mellitus with unspecified complications: Secondary | ICD-10-CM

## 2022-05-27 DIAGNOSIS — I10 Essential (primary) hypertension: Secondary | ICD-10-CM

## 2022-05-27 DIAGNOSIS — E785 Hyperlipidemia, unspecified: Secondary | ICD-10-CM

## 2022-06-03 ENCOUNTER — Ambulatory Visit: Payer: Medicare Other | Admitting: Allergy and Immunology

## 2022-06-04 ENCOUNTER — Ambulatory Visit (INDEPENDENT_AMBULATORY_CARE_PROVIDER_SITE_OTHER): Payer: Medicare Other | Admitting: Emergency Medicine

## 2022-06-04 ENCOUNTER — Encounter: Payer: Self-pay | Admitting: Emergency Medicine

## 2022-06-04 VITALS — BP 126/74 | HR 74 | Temp 98.1°F | Ht 68.0 in | Wt 208.5 lb

## 2022-06-04 DIAGNOSIS — E118 Type 2 diabetes mellitus with unspecified complications: Secondary | ICD-10-CM

## 2022-06-04 DIAGNOSIS — J449 Chronic obstructive pulmonary disease, unspecified: Secondary | ICD-10-CM

## 2022-06-04 DIAGNOSIS — E785 Hyperlipidemia, unspecified: Secondary | ICD-10-CM | POA: Diagnosis not present

## 2022-06-04 DIAGNOSIS — K219 Gastro-esophageal reflux disease without esophagitis: Secondary | ICD-10-CM | POA: Diagnosis not present

## 2022-06-04 DIAGNOSIS — I10 Essential (primary) hypertension: Secondary | ICD-10-CM

## 2022-06-04 DIAGNOSIS — M1000 Idiopathic gout, unspecified site: Secondary | ICD-10-CM | POA: Diagnosis not present

## 2022-06-04 MED ORDER — OLMESARTAN MEDOXOMIL 40 MG PO TABS: 40.0000 mg | ORAL_TABLET | Freq: Every day | ORAL | 1 refills | Status: AC

## 2022-06-04 NOTE — Assessment & Plan Note (Signed)
Stable and asymptomatic. Takes famotidine as needed and daily pantoprazole 40 mg

## 2022-06-04 NOTE — Progress Notes (Signed)
Edwin [name] 82 y.o.   Chief Complaint  Patient presents with   Follow-up    Medication refill,  patient has not seen PCP since 2022    HISTORY OF PRESENT ILLNESS: This is a 82 y.o. male with history of hypertension and COPD here for medication refill. Patient of Dr. Scarlette Calico.  No recent office visit.  Needs a refill on olmesartan. No other complaints or medical concerns today.  HPI   Prior to Admission medications   Medication Sig Start Date End Date Taking? Authorizing Provider  acyclovir (ZOVIRAX) 200 MG capsule Take 1 capsule (200 mg total) by mouth 4 (four) times daily. 08/27/21  Yes Kozlow, Donnamarie Poag, MD  albuterol (VENTOLIN HFA) 108 (90 Base) MCG/ACT inhaler Inhale 2 puffs into the lungs every 4 (four) hours as needed for wheezing or shortness of breath. INHALE 1-2 PUFFS INTO THE LUNGS EVERY 4 HOURS AS NEEDED FOR WHEEZING OR SHORTNESS OF BREATH 05/13/22  Yes Kozlow, Donnamarie Poag, MD  allopurinol (ZYLOPRIM) 300 MG tablet TAKE 1 TABLET BY MOUTH EVERY DAY 01/22/22  Yes Janith Lima, MD  aspirin (ASPIRIN CHILDRENS) 81 MG chewable tablet Chew 1 tablet (81 mg total) by mouth daily. 10/03/20  Yes Adrian Prows, MD  atorvastatin (LIPITOR) 20 MG tablet TAKE 1 TABLET BY MOUTH EVERY DAY 01/22/22  Yes Janith Lima, MD  azithromycin (ZITHROMAX) 500 MG tablet Take 1 tablet once a day for 3 days 11/27/21  Yes Kozlow, Donnamarie Poag, MD  betamethasone dipropionate 0.05 % cream Apply topically. 06/26/21  Yes [provider]  brimonidine (ALPHAGAN P) 0.1 % SOLN Place 1 drop into both eyes 2 (two) times daily. Both eyes   Yes [provider]  brimonidine (ALPHAGAN) 0.2 % ophthalmic solution  09/23/21  Yes [provider]  Cyanocobalamin (VITAMIN B 12 PO) Take 1 tablet by mouth daily.   Yes [provider]  dextromethorphan-guaiFENesin (MUCINEX DM) 30-600 MG 12hr tablet Take 1 tablet by mouth 2 (two) times daily as needed for cough. 11/19/21  Yes Kozlow, Donnamarie Poag, MD  famotidine  (PEPCID) 40 MG tablet Take 1 tablet (40 mg total) by mouth at bedtime. 05/13/22  Yes Kozlow, Donnamarie Poag, MD  FLUoxetine (PROZAC) 10 MG capsule TAKE 1 CAPSULE BY MOUTH EVERY DAY 03/03/22  Yes Janith Lima, MD  Fluticasone-Umeclidin-Vilant (TRELEGY ELLIPTA) 200-62.5-25 MCG/ACT AEPB Inhale 1 Inhalation into the lungs daily. 05/13/22  Yes Kozlow, Donnamarie Poag, MD  ipratropium-albuterol (DUONEB) 0.5-2.5 (3) MG/3ML SOLN Take 3 mLs by nebulization every 6 (six) hours as needed. 04/17/22  Yes Kozlow, Donnamarie Poag, MD  levocetirizine (XYZAL) 5 MG tablet Take 1 tablet (5 mg total) by mouth daily as needed for allergies (Can take an extra dose during flare). 05/13/22  Yes Kozlow, Donnamarie Poag, MD  LUMIGAN 0.01 % SOLN  08/15/18  Yes [provider]  mometasone (ELOCON) 0.1 % ointment Apply topically once daily as needed 03/19/21  Yes Kozlow, Donnamarie Poag, MD  olmesartan (BENICAR) 40 MG tablet TAKE 1 TABLET BY MOUTH EVERY DAY 11/19/21  Yes Janith Lima, MD  pantoprazole (PROTONIX) 40 MG tablet Take 1 tablet (40 mg total) by mouth in the morning. TAKE 1 TABLET BY MOUTH EVERY DAY IN THE MORNING 05/13/22  Yes Kozlow, Donnamarie Poag, MD  predniSONE (DELTASONE) 10 MG tablet 1 tab daily x 14 days, then 1/2 tab daily x 14 days 05/13/22  Yes Kozlow, Donnamarie Poag, MD  psyllium (METAMUCIL) 58.6 % powder Take 1 packet by mouth daily.  Yes [provider]  triamcinolone (NASACORT) 55 MCG/ACT AERO nasal inhaler Place 1 spray into the nose daily. 04/15/22  Yes Kozlow, Donnamarie Poag, MD    Allergies  Allergen Reactions   Ramipril Other (See Comments)    REACTION: chest congestion & cough. No angioedema    Patient Active Problem List   Diagnosis Date Noted   Pain due to onychomycosis of toenails of both feet 08/23/2021   Cough productive of purulent sputum 05/21/2021   Stage 3a chronic kidney disease ([place]) 05/21/2021   Type II diabetes mellitus with manifestations ([place]) 10/04/2020   New onset right bundle branch block (RBBB) 10/01/2020    Cervical herniated disc 03/08/2018   Asthmatic bronchitis with prominent rhinitis 12/12/2017   NSIP (nonspecific interstitial pneumonia) ([place]) 08/26/2017   LPRD (laryngopharyngeal reflux disease) 06/06/2015   COPD with asthma 06/06/2015   COPD GOLD 0 s/p smoking cessation in 1985 02/15/2015   Benign prostatic hyperplasia 10/03/2013   Hyperlipidemia with target LDL less than 130 05/13/2012   Glaucoma 01/08/2011   GERD 12/20/2008   Gout 01/20/2007   Essential hypertension 01/20/2007    Past Medical History:  Diagnosis Date   Asthma    COPD (chronic obstructive pulmonary disease) ([place])    reactive airway disease   Glaucoma    Gout    Hx of colonic polyps    Dr Earlean Shawl   Hyperlipidemia    NMR lipoprofile 2005: LDL 093(2671/245), HLD 37, TG 190.   Hypertension    Pneumonia     Past Surgical History:  Procedure Laterality Date   ANTERIOR CERVICAL DECOMP/DISCECTOMY FUSION N/A 03/08/2018   Procedure: ANTERIOR CERVICAL DECOMPRESSION/DISCECTOMY FUSION, INTERBODY PROSTHESIS,PLATE/SCREWS CERVICAL FOUR- CERVICAL FIVE, CERVICAL FIVE- CERVICAL SIX, CERVICAL SIX- CERVICAL SEVEN;  Surgeon: Newman Pies, MD;  Location: [facility];  Service: Neurosurgery;  Laterality: N/A;  anterior   CATARACT EXTRACTION     bilat, implants   [place]   POLYPECTOMY  2003    neg 2006, Dr.Medoff   tear duct surgery  1998   for excess tearing, skin graft L foot @ age 38 1/2    Social History   Socioeconomic History   Marital status: Married    Spouse name: Not on file   Number of children: 2   Years of education: Not on file   Highest education level: Not on file  Occupational History   Occupation: Charity fundraiser   Occupation: PRESIDENT    Employer: [employer]  Tobacco Use   Smoking status: Former    Packs/day: 3.00    Years: 25.00    Total pack years: 75.00    Types: Cigarettes    Start date: 06/03/1963    Quit date: 06/03/1983    Years since quitting: 39.0    Smokeless tobacco: Never  Vaping Use   Vaping Use: Never used  Substance and Sexual Activity   Alcohol use: No   Drug use: No   Sexual activity: Yes  Other Topics Concern   Not on file  Social History Narrative   Regular Exercise- no         Social Determinants of Health   Financial Resource Strain: Low Risk  (08/01/2021)   Overall Financial Resource Strain (CARDIA)    Difficulty of Paying Living Expenses: Not hard at all  Food Insecurity: No Food Insecurity (08/01/2021)   Hunger Vital Sign    Worried About Running Out of Food in the Last Year: Never true  Ran Out of Food in the Last Year: Never true  Transportation Needs: No Transportation Needs (08/01/2021)   PRAPARE - Hydrologist (Medical): No    Lack of Transportation (Non-Medical): No  Physical Activity: Inactive (08/01/2021)   Exercise Vital Sign    Days of Exercise per Week: 0 days    Minutes of Exercise per Session: 0 min  Stress: No Stress Concern Present (08/01/2021)   [place]    Feeling of Stress : Not at all  Social Connections: [place] (08/01/2021)   Social Connection and Isolation Panel [NHANES]    Frequency of Communication with Friends and Family: More than three times a week    Frequency of Social Gatherings with Friends and Family: More than three times a week    Attends Religious Services: More than 4 times per year    Active Member of [employer] or Organizations: Yes    Attends Archivist Meetings: Never    Marital Status: Married  Human resources officer Violence: Not At Risk (08/01/2021)   Humiliation, Afraid, Rape, and Kick questionnaire    Fear of Current or Ex-Partner: No    Emotionally Abused: No    Physically Abused: No    Sexually Abused: No    Family History  Problem Relation Age of Onset   Heart attack Father 68       due to asthma flare   Asthma Father    Emphysema Father     Hypertension Mother    Cancer Mother         ? primary   Asthma Son        x2   Allergic rhinitis Neg Hx    Angioedema Neg Hx    Atopy Neg Hx    Eczema Neg Hx    Immunodeficiency Neg Hx    Urticaria Neg Hx      Review of Systems  Constitutional: Negative.  Negative for chills and fever.  HENT: Negative.  Negative for congestion and sore throat.   Respiratory: Negative.  Negative for cough and shortness of breath.   Cardiovascular: Negative.  Negative for chest pain and palpitations.  Gastrointestinal: Negative.  Negative for abdominal pain, diarrhea, nausea and vomiting.  Genitourinary: Negative.  Negative for dysuria and hematuria.  Skin: Negative.  Negative for rash.  Neurological: Negative.  Negative for dizziness and headaches.  All other systems reviewed and are negative.  Today's Vitals   06/04/22 1420  BP: 126/74  Pulse: 74  Temp: 98.1 F (36.7 C)  TempSrc: Oral  SpO2: 92%  Weight: 208 lb 8 oz (94.6 kg)  Height: '5\' 8"'$  (1.727 m)   Body mass index is 31.7 kg/m.   Physical Exam Vitals reviewed.  Constitutional:      Appearance: Normal appearance.  HENT:     Head: Normocephalic.  Eyes:     Extraocular Movements: Extraocular movements intact.     Pupils: Pupils are equal, round, and reactive to light.  Cardiovascular:     Rate and Rhythm: Normal rate and regular rhythm.     Pulses: Normal pulses.     Heart sounds: Normal heart sounds.  Pulmonary:     Effort: Pulmonary effort is normal.     Breath sounds: Normal breath sounds.  Skin:    General: Skin is warm and dry.  Neurological:     General: No focal deficit present.     Mental Status: He is alert and  oriented to person, place, and time.  Psychiatric:        Mood and Affect: Mood normal.        Behavior: Behavior normal.      ASSESSMENT & PLAN: A total of 34 minutes was spent with the patient and counseling/coordination of care regarding preparing for this visit, review of most recent office  visit notes, review of multiple chronic medical conditions under management, review of all medications, review of most recent blood work results, prognosis, documentation and need for follow-up with PCP.  Problem List Items Addressed This Visit       Cardiovascular and Mediastinum   Essential hypertension - Primary    Well-controlled hypertension. Continue olmesartan 40 mg daily. BP Readings from Last 3 Encounters:  06/04/22 126/74  05/13/22 132/86  05/12/22 138/78        Relevant Medications   olmesartan (BENICAR) 40 MG tablet     Respiratory   COPD GOLD 0 s/p smoking cessation in 1985    Stable. Continue daily Trelegy and albuterol as needed        Digestive   GERD    Stable and asymptomatic. Takes famotidine as needed and daily pantoprazole 40 mg        Endocrine   Type II diabetes mellitus with manifestations ([facility])   Relevant Medications   olmesartan (BENICAR) 40 MG tablet     Other   Gout    Takes allopurinol 300 mg daily.  Stable.  No recent flareups      Hyperlipidemia with target LDL less than 130    Stable.  Continue atorvastatin 20 mg daily.      Relevant Medications   olmesartan (BENICAR) 40 MG tablet   Patient Instructions  Hypertension, Adult High blood pressure (hypertension) is when the force of blood pumping through the arteries is too strong. The arteries are the blood vessels that carry blood from the heart throughout the body. Hypertension forces the heart to work harder to pump blood and may cause arteries to become narrow or stiff. Untreated or uncontrolled hypertension can lead to a heart attack, heart failure, a stroke, kidney disease, and other problems. A blood pressure reading consists of a higher number over a lower number. Ideally, your blood pressure should be below 120/80. The first ("top") number is called the systolic pressure. It is a measure of the pressure in your arteries as your heart beats. The second ("bottom") number is  called the diastolic pressure. It is a measure of the pressure in your arteries as the heart relaxes. What are the causes? The exact cause of this condition is not known. There are some conditions that result in high blood pressure. What increases the risk? Certain factors may make you more likely to develop high blood pressure. Some of these risk factors are under your control, including: Smoking. Not getting enough exercise or physical activity. Being overweight. Having too much fat, sugar, calories, or salt (sodium) in your diet. Drinking too much alcohol. Other risk factors include: Having a personal history of heart disease, diabetes, high cholesterol, or kidney disease. Stress. Having a family history of high blood pressure and high cholesterol. Having obstructive sleep apnea. Age. The risk increases with age. What are the signs or symptoms? High blood pressure may not cause symptoms. Very high blood pressure (hypertensive crisis) may cause: Headache. Fast or irregular heartbeats (palpitations). Shortness of breath. Nosebleed. Nausea and vomiting. Vision changes. Severe chest pain, dizziness, and seizures. How is this diagnosed? This condition  is diagnosed by measuring your blood pressure while you are seated, with your arm resting on a flat surface, your legs uncrossed, and your feet flat on the floor. The cuff of the blood pressure monitor will be placed directly against the skin of your upper arm at the level of your heart. Blood pressure should be measured at least twice using the same arm. Certain conditions can cause a difference in blood pressure between your right and left arms. If you have a high blood pressure reading during one visit or you have normal blood pressure with other risk factors, you may be asked to: Return on a different day to have your blood pressure checked again. Monitor your blood pressure at home for 1 week or longer. If you are diagnosed with  hypertension, you may have other blood or imaging tests to help your health care provider understand your overall risk for other conditions. How is this treated? This condition is treated by making healthy lifestyle changes, such as eating healthy foods, exercising more, and reducing your alcohol intake. You may be referred for counseling on a healthy diet and physical activity. Your health care provider may prescribe medicine if lifestyle changes are not enough to get your blood pressure under control and if: Your systolic blood pressure is above 130. Your diastolic blood pressure is above 80. Your personal target blood pressure may vary depending on your medical conditions, your age, and other factors. Follow these instructions at home: Eating and drinking  Eat a diet that is high in fiber and potassium, and low in sodium, added sugar, and fat. An example of this eating plan is called the DASH diet. DASH stands for Dietary Approaches to Stop Hypertension. To eat this way: Eat plenty of fresh fruits and vegetables. Try to fill one half of your plate at each meal with fruits and vegetables. Eat whole grains, such as whole-wheat pasta, brown rice, or whole-grain bread. Fill about one fourth of your plate with whole grains. Eat or drink low-fat dairy products, such as skim milk or low-fat yogurt. Avoid fatty cuts of meat, processed or cured meats, and poultry with skin. Fill about one fourth of your plate with lean proteins, such as fish, chicken without skin, beans, eggs, or tofu. Avoid pre-made and processed foods. These tend to be higher in sodium, added sugar, and fat. Reduce your daily sodium intake. Many people with hypertension should eat less than 1,500 mg of sodium a day. Do not drink alcohol if: Your health care provider tells you not to drink. You are pregnant, may be pregnant, or are planning to become pregnant. If you drink alcohol: Limit how much you have to: 0-1 drink a day for  women. 0-2 drinks a day for men. Know how much alcohol is in your drink. In the [place] one drink equals one 12 oz bottle of beer (355 mL), one 5 oz glass of wine (148 mL), or one 1 oz glass of hard liquor (44 mL). Lifestyle  Work with your health care provider to maintain a healthy body weight or to lose weight. Ask what an ideal weight is for you. Get at least 30 minutes of exercise that causes your heart to beat faster (aerobic exercise) most days of the week. Activities may include walking, swimming, or biking. Include exercise to strengthen your muscles (resistance exercise), such as Pilates or lifting weights, as part of your weekly exercise routine. Try to do these types of exercises for 30 minutes at least 3  days a week. Do not use any products that contain nicotine or tobacco. These products include cigarettes, chewing tobacco, and vaping devices, such as e-cigarettes. If you need help quitting, ask your health care provider. Monitor your blood pressure at home as told by your health care provider. Keep all follow-up visits. This is important. Medicines Take over-the-counter and prescription medicines only as told by your health care provider. Follow directions carefully. Blood pressure medicines must be taken as prescribed. Do not skip doses of blood pressure medicine. Doing this puts you at risk for problems and can make the medicine less effective. Ask your health care provider about side effects or reactions to medicines that you should watch for. Contact a health care provider if you: Think you are having a reaction to a medicine you are taking. Have headaches that keep coming back (recurring). Feel dizzy. Have swelling in your ankles. Have trouble with your vision. Get help right away if you: Develop a severe headache or confusion. Have unusual weakness or numbness. Feel faint. Have severe pain in your chest or abdomen. Vomit repeatedly. Have trouble breathing. These  symptoms may be an emergency. Get help right away. Call 911. Do not wait to see if the symptoms will go away. Do not drive yourself to the hospital. Summary Hypertension is when the force of blood pumping through your arteries is too strong. If this condition is not controlled, it may put you at risk for serious complications. Your personal target blood pressure may vary depending on your medical conditions, your age, and other factors. For most people, a normal blood pressure is less than 120/80. Hypertension is treated with lifestyle changes, medicines, or a combination of both. Lifestyle changes include losing weight, eating a healthy, low-sodium diet, exercising more, and limiting alcohol. This information is not intended to replace advice given to you by your health care provider. Make sure you discuss any questions you have with your health care provider. Document Revised: 03/26/2021 Document Reviewed: 03/26/2021 Elsevier Patient Education  [place], MD [facility]

## 2022-06-04 NOTE — Assessment & Plan Note (Signed)
Well-controlled hypertension. Continue olmesartan 40 mg daily. BP Readings from Last 3 Encounters:  06/04/22 126/74  05/13/22 132/86  05/12/22 138/78

## 2022-06-04 NOTE — Patient Instructions (Signed)
Hypertension, Adult High blood pressure (hypertension) is when the force of blood pumping through the arteries is too strong. The arteries are the blood vessels that carry blood from the heart throughout the body. Hypertension forces the heart to work harder to pump blood and may cause arteries to become narrow or stiff. Untreated or uncontrolled hypertension can lead to a heart attack, heart failure, a stroke, kidney disease, and other problems. A blood pressure reading consists of a higher number over a lower number. Ideally, your blood pressure should be below 120/80. The first ("top") number is called the systolic pressure. It is a measure of the pressure in your arteries as your heart beats. The second ("bottom") number is called the diastolic pressure. It is a measure of the pressure in your arteries as the heart relaxes. What are the causes? The exact cause of this condition is not known. There are some conditions that result in high blood pressure. What increases the risk? Certain factors may make you more likely to develop high blood pressure. Some of these risk factors are under your control, including: Smoking. Not getting enough exercise or physical activity. Being overweight. Having too much fat, sugar, calories, or salt (sodium) in your diet. Drinking too much alcohol. Other risk factors include: Having a personal history of heart disease, diabetes, high cholesterol, or kidney disease. Stress. Having a family history of high blood pressure and high cholesterol. Having obstructive sleep apnea. Age. The risk increases with age. What are the signs or symptoms? High blood pressure may not cause symptoms. Very high blood pressure (hypertensive crisis) may cause: Headache. Fast or irregular heartbeats (palpitations). Shortness of breath. Nosebleed. Nausea and vomiting. Vision changes. Severe chest pain, dizziness, and seizures. How is this diagnosed? This condition is diagnosed by  measuring your blood pressure while you are seated, with your arm resting on a flat surface, your legs uncrossed, and your feet flat on the floor. The cuff of the blood pressure monitor will be placed directly against the skin of your upper arm at the level of your heart. Blood pressure should be measured at least twice using the same arm. Certain conditions can cause a difference in blood pressure between your right and left arms. If you have a high blood pressure reading during one visit or you have normal blood pressure with other risk factors, you may be asked to: Return on a different day to have your blood pressure checked again. Monitor your blood pressure at home for 1 week or longer. If you are diagnosed with hypertension, you may have other blood or imaging tests to help your health care provider understand your overall risk for other conditions. How is this treated? This condition is treated by making healthy lifestyle changes, such as eating healthy foods, exercising more, and reducing your alcohol intake. You may be referred for counseling on a healthy diet and physical activity. Your health care provider may prescribe medicine if lifestyle changes are not enough to get your blood pressure under control and if: Your systolic blood pressure is above 130. Your diastolic blood pressure is above 80. Your personal target blood pressure may vary depending on your medical conditions, your age, and other factors. Follow these instructions at home: Eating and drinking  Eat a diet that is high in fiber and potassium, and low in sodium, added sugar, and fat. An example of this eating plan is called the DASH diet. DASH stands for Dietary Approaches to Stop Hypertension. To eat this way: Eat   plenty of fresh fruits and vegetables. Try to fill one half of your plate at each meal with fruits and vegetables. Eat whole grains, such as whole-wheat pasta, brown rice, or whole-grain bread. Fill about one  fourth of your plate with whole grains. Eat or drink low-fat dairy products, such as skim milk or low-fat yogurt. Avoid fatty cuts of meat, processed or cured meats, and poultry with skin. Fill about one fourth of your plate with lean proteins, such as fish, chicken without skin, beans, eggs, or tofu. Avoid pre-made and processed foods. These tend to be higher in sodium, added sugar, and fat. Reduce your daily sodium intake. Many people with hypertension should eat less than 1,500 mg of sodium a day. Do not drink alcohol if: Your health care provider tells you not to drink. You are pregnant, may be pregnant, or are planning to become pregnant. If you drink alcohol: Limit how much you have to: 0-1 drink a day for women. 0-2 drinks a day for men. Know how much alcohol is in your drink. In the [place] one drink equals one 12 oz bottle of beer (355 mL), one 5 oz glass of wine (148 mL), or one 1 oz glass of hard liquor (44 mL). Lifestyle  Work with your health care provider to maintain a healthy body weight or to lose weight. Ask what an ideal weight is for you. Get at least 30 minutes of exercise that causes your heart to beat faster (aerobic exercise) most days of the week. Activities may include walking, swimming, or biking. Include exercise to strengthen your muscles (resistance exercise), such as Pilates or lifting weights, as part of your weekly exercise routine. Try to do these types of exercises for 30 minutes at least 3 days a week. Do not use any products that contain nicotine or tobacco. These products include cigarettes, chewing tobacco, and vaping devices, such as e-cigarettes. If you need help quitting, ask your health care provider. Monitor your blood pressure at home as told by your health care provider. Keep all follow-up visits. This is important. Medicines Take over-the-counter and prescription medicines only as told by your health care provider. Follow directions carefully. Blood  pressure medicines must be taken as prescribed. Do not skip doses of blood pressure medicine. Doing this puts you at risk for problems and can make the medicine less effective. Ask your health care provider about side effects or reactions to medicines that you should watch for. Contact a health care provider if you: Think you are having a reaction to a medicine you are taking. Have headaches that keep coming back (recurring). Feel dizzy. Have swelling in your ankles. Have trouble with your vision. Get help right away if you: Develop a severe headache or confusion. Have unusual weakness or numbness. Feel faint. Have severe pain in your chest or abdomen. Vomit repeatedly. Have trouble breathing. These symptoms may be an emergency. Get help right away. Call 911. Do not wait to see if the symptoms will go away. Do not drive yourself to the hospital. Summary Hypertension is when the force of blood pumping through your arteries is too strong. If this condition is not controlled, it may put you at risk for serious complications. Your personal target blood pressure may vary depending on your medical conditions, your age, and other factors. For most people, a normal blood pressure is less than 120/80. Hypertension is treated with lifestyle changes, medicines, or a combination of both. Lifestyle changes include losing weight, eating a healthy,   low-sodium diet, exercising more, and limiting alcohol. This information is not intended to replace advice given to you by your health care provider. Make sure you discuss any questions you have with your health care provider. Document Revised: 03/26/2021 Document Reviewed: 03/26/2021 Elsevier Patient Education  2023 [employer] Inc.  

## 2022-06-04 NOTE — Assessment & Plan Note (Signed)
Stable.  Continue atorvastatin 20 mg daily. 

## 2022-06-04 NOTE — Assessment & Plan Note (Signed)
Takes allopurinol 300 mg daily.  Stable.  No recent flareups

## 2022-06-04 NOTE — Assessment & Plan Note (Signed)
Stable. Continue daily Trelegy and albuterol as needed

## 2022-06-07 ENCOUNTER — Other Ambulatory Visit: Payer: Self-pay | Admitting: Allergy and Immunology

## 2022-06-10 ENCOUNTER — Ambulatory Visit: Payer: Medicare Other | Admitting: Allergy and Immunology

## 2022-06-16 ENCOUNTER — Other Ambulatory Visit: Payer: Self-pay | Admitting: Internal Medicine

## 2022-06-16 DIAGNOSIS — F3342 Major depressive disorder, recurrent, in full remission: Secondary | ICD-10-CM

## 2022-06-17 ENCOUNTER — Ambulatory Visit (INDEPENDENT_AMBULATORY_CARE_PROVIDER_SITE_OTHER): Payer: Medicare Other | Admitting: Allergy and Immunology

## 2022-06-17 VITALS — BP 126/68 | HR 94 | Temp 98.6°F | Resp 14 | Ht 68.0 in | Wt 202.3 lb

## 2022-06-17 DIAGNOSIS — J986 Disorders of diaphragm: Secondary | ICD-10-CM

## 2022-06-17 DIAGNOSIS — J4489 Other specified chronic obstructive pulmonary disease: Secondary | ICD-10-CM

## 2022-06-17 DIAGNOSIS — B001 Herpesviral vesicular dermatitis: Secondary | ICD-10-CM

## 2022-06-17 DIAGNOSIS — R5383 Other fatigue: Secondary | ICD-10-CM | POA: Diagnosis not present

## 2022-06-17 DIAGNOSIS — R0902 Hypoxemia: Secondary | ICD-10-CM

## 2022-06-17 DIAGNOSIS — J3089 Other allergic rhinitis: Secondary | ICD-10-CM | POA: Diagnosis not present

## 2022-06-17 DIAGNOSIS — K219 Gastro-esophageal reflux disease without esophagitis: Secondary | ICD-10-CM

## 2022-06-17 DIAGNOSIS — E2740 Unspecified adrenocortical insufficiency: Secondary | ICD-10-CM | POA: Diagnosis not present

## 2022-06-17 DIAGNOSIS — J479 Bronchiectasis, uncomplicated: Secondary | ICD-10-CM | POA: Diagnosis not present

## 2022-06-17 MED ORDER — PREDNISONE 10 MG PO TABS: 10.0000 mg | ORAL_TABLET | Freq: Every day | ORAL | 0 refills | Status: DC

## 2022-06-17 MED ORDER — AZITHROMYCIN 500 MG PO TABS: 500.0000 mg | ORAL_TABLET | Freq: Every day | ORAL | 0 refills | Status: AC

## 2022-06-17 NOTE — Patient Instructions (Addendum)
   1. Continue to Treat inflammation:   A. Trelegy 200 -1 inhalation 1 time per day (empty lungs)  B. Prednisone - 20 mg administered in clinic  C. Prednisone - 20 mg this evening   D. Prednisone - 20 mg tomorrow  E. Prednisone 10 mg daily, every day  2. Continue to Treat reflux / LPR:   A. Pantoprazole 40 mg in the a.m. + famotidine 40 mg in the p.m.  3. Treat possible infection:   A. Azithromycin 500 mg - 1 tablet 1 time per day for 3 days   4. If needed:   A. ProAir HFA 2 puffs every 4-6 hours  B. nasal saline washes  C. OTC antihistamine  D. Mucinex DM 2 tablets twice a day  E. Duoneb nebulized every 4-6 hours if needed.  F. Nasacort one spray each nostril one time per day  G. Acyclovir 200 - 4 times a day during fever blister outbreak  4.  Continue oxygen at 2 liters [place] administration  5.  Continue Respirtech percussion vest  6.  Blood - CBC w/d, CMP, cortisol, BNP  7.  Return to clinic in 4 weeks or earlier if problem

## 2022-06-17 NOTE — Progress Notes (Signed)
[facility] [place] [facility]   Follow-up Note  Referring Provider: Janith Lima, MD Primary Provider: Janith Lima, MD Date of Office Visit: 06/17/2022  Subjective:   Edwin [name] (DOB: [birth year]) is a 82 y.o. male who returns to the [facility] on 06/17/2022 in re-evaluation of the following:  HPI: Haruto returns to this clinic in evaluation of COPD/asthma overlap, bronchiectasis, right hemiparesis of diaphragm, allergic rhinitis, history of chronic sinusitis, history of LPR, and history of intermittent herpes labialis.  I last saw him in this clinic 13 May 2022.  When I last saw him in this clinic he appeared to be having continued cough and a lot of dyspnea on exertion and he could not really exercise any significant amount and when using the short acting bronchodilator he is not really sure that it helped him very much.  We gave him systemic steroids and replaced his Breztri with Trelegy.  He might have improved slightly but never really completely resolved the issue and 4 days ago he developed acute onset of fatigue and lethargy and cough and he developed some hives on his arms.  He was also very nauseated for the first 2 days of this episode.  He did not have any chills or aches and he did not have any ugly nasal discharge or significant upper airway symptoms and his reflux was under control.  He arrives today with his wife and she informs me that at Thanksgiving he appeared to have an event where he had altered mental status and seemed extremely lethargic for at least 30 minutes in conjunction with a cough [place]  She believes that ever since that point in time he has not really been the same type of person.  She thinks that he has a little bit more lethargy and malaise and even his grandchildren have noticed that issue.  Allergies as of 06/17/2022       Reactions   Ramipril Other (See Comments)    REACTION: chest congestion & cough. No angioedema        Medication List    acyclovir 200 MG capsule Commonly known as: Zovirax Take 1 capsule (200 mg total) by mouth 4 (four) times daily.   albuterol 108 (90 Base) MCG/ACT inhaler Commonly known as: Ventolin HFA Inhale 2 puffs into the lungs every 4 (four) hours as needed for wheezing or shortness of breath. INHALE 1-2 PUFFS INTO THE LUNGS EVERY 4 HOURS AS NEEDED FOR WHEEZING OR SHORTNESS OF BREATH   allopurinol 300 MG tablet Commonly known as: ZYLOPRIM TAKE 1 TABLET BY MOUTH EVERY DAY   aspirin 81 MG chewable tablet Commonly known as: Aspirin Childrens Chew 1 tablet (81 mg total) by mouth daily.   atorvastatin 20 MG tablet Commonly known as: LIPITOR TAKE 1 TABLET BY MOUTH EVERY DAY   azithromycin 500 MG tablet Commonly known as: Zithromax Take 1 tablet once a day for 3 days   betamethasone dipropionate 0.05 % cream Apply topically.   brimonidine 0.1 % Soln Commonly known as: ALPHAGAN P Place 1 drop into both eyes 2 (two) times daily. Both eyes   brimonidine 0.2 % ophthalmic solution Commonly known as: ALPHAGAN   dextromethorphan-guaiFENesin 30-600 MG 12hr tablet Commonly known as: MUCINEX DM Take 1 tablet by mouth 2 (two) times daily as needed for cough.   famotidine 40 MG tablet Commonly known as: PEPCID Take 1 tablet (40 mg total) by  mouth at bedtime.   FLUoxetine 10 MG capsule Commonly known as: PROZAC TAKE 1 CAPSULE BY MOUTH EVERY DAY   ipratropium-albuterol 0.5-2.5 (3) MG/3ML Soln Commonly known as: DUONEB Take 3 mLs by nebulization every 6 (six) hours as needed.   levocetirizine 5 MG tablet Commonly known as: XYZAL Take 1 tablet (5 mg total) by mouth daily as needed for allergies (Can take an extra dose during flare).   Lumigan 0.01 % Soln Generic drug: bimatoprost   mometasone 0.1 % ointment Commonly known as: ELOCON Apply topically once daily as needed   olmesartan 40 MG tablet Commonly  known as: BENICAR Take 1 tablet (40 mg total) by mouth daily.   pantoprazole 40 MG tablet Commonly known as: PROTONIX Take 1 tablet (40 mg total) by mouth in the morning. TAKE 1 TABLET BY MOUTH EVERY DAY IN THE MORNING   predniSONE 10 MG tablet Commonly known as: DELTASONE 1 tab daily x 14 days, then 1/2 tab daily x 14 days   psyllium 58.6 % powder Commonly known as: METAMUCIL Take 1 packet by mouth daily.   Trelegy Ellipta 200-62.5-25 MCG/ACT Aepb Generic drug: Fluticasone-Umeclidin-Vilant Inhale 1 Inhalation into the lungs daily.   triamcinolone 55 MCG/ACT Aero nasal inhaler Commonly known as: NASACORT Place 1 spray into the nose daily.   VITAMIN B 12 PO Take 1 tablet by mouth daily.        Past Medical History:  Diagnosis Date   Asthma    COPD (chronic obstructive pulmonary disease) ([place])    reactive airway disease   Glaucoma    Gout    Hx of colonic polyps    Dr Earlean Shawl   Hyperlipidemia    NMR lipoprofile 2005: LDL 113(1416/825), HLD 37, TG 190.   Hypertension    Pneumonia     Past Surgical History:  Procedure Laterality Date   ANTERIOR CERVICAL DECOMP/DISCECTOMY FUSION N/A 03/08/2018   Procedure: ANTERIOR CERVICAL DECOMPRESSION/DISCECTOMY FUSION, INTERBODY PROSTHESIS,PLATE/SCREWS CERVICAL FOUR- CERVICAL FIVE, CERVICAL FIVE- CERVICAL SIX, CERVICAL SIX- CERVICAL SEVEN;  Surgeon: Newman Pies, MD;  Location: [facility];  Service: Neurosurgery;  Laterality: N/A;  anterior   CATARACT EXTRACTION     bilat, implants   [place]   POLYPECTOMY  2003    neg 2006, Dr.Medoff   tear duct surgery  1998   for excess tearing, skin graft L foot @ age 68 1/2    Review of systems negative except as noted in HPI / PMHx or noted below:  Review of Systems  Constitutional: Negative.   HENT: Negative.    Eyes: Negative.   Respiratory: Negative.    Cardiovascular: Negative.   Gastrointestinal: Negative.   Genitourinary: Negative.    Musculoskeletal: Negative.   Skin: Negative.   Neurological: Negative.   Endo/Heme/Allergies: Negative.   Psychiatric/Behavioral: Negative.       Objective:   Vitals:   06/17/22 1622  BP: 126/68  Pulse: 94  Resp: 14  Temp: 98.6 F (37 C)  SpO2: 90%   Height: '5\' 8"'$  (172.7 cm)  Weight: 202 lb 4.8 oz (91.8 kg)   Physical Exam Constitutional:      Appearance: He is not diaphoretic.  HENT:     Head: Normocephalic.     Right Ear: Tympanic membrane, ear canal and external ear normal.     Left Ear: Tympanic membrane, ear canal and external ear normal.     Nose: Nose normal. No mucosal edema or rhinorrhea.  Mouth/Throat:     Pharynx: Uvula midline. No oropharyngeal exudate.  Eyes:     Conjunctiva/sclera: Conjunctivae normal.  Neck:     Thyroid: No thyromegaly.     Trachea: Trachea normal. No tracheal tenderness or tracheal deviation.  Cardiovascular:     Rate and Rhythm: Normal rate and regular rhythm.     Heart sounds: Normal heart sounds, S1 normal and S2 normal. No murmur heard. Pulmonary:     Effort: No respiratory distress.     Breath sounds: No stridor. Wheezing (Scattered inspiratory and expiratory wheezing and rhonchi by all lung [place]) present. No rales.  Lymphadenopathy:     Head:     Right side of head: No tonsillar adenopathy.     Left side of head: No tonsillar adenopathy.     Cervical: No cervical adenopathy.  Skin:    Findings: No erythema or rash.     Nails: There is no clubbing.  Neurological:     Mental Status: He is alert.     Diagnostics:   Oxygen saturation was 88% on room air at rest.    Oxygen saturation was 93% on 2 L nasal cannula at rest.  Assessment and Plan:   1. COPD with asthma   2. Bronchiectasis without complication (HCC)   3. Other allergic rhinitis   4. LPRD (laryngopharyngeal reflux disease)   5. Diaphragm dysfunction   6. Herpes labialis   7. Hypoxemia   8. Lethargy   9. Adrenal insufficiency ([place])     1.  Continue to Treat inflammation:   A. Trelegy 200 -1 inhalation 1 time per day (empty lungs)  B. Prednisone - 20 mg administered in clinic  C. Prednisone - 20 mg this evening   D. Prednisone - 20 mg tomorrow  E. Prednisone 10 mg daily, every day  2. Continue to Treat reflux / LPR:   A. Pantoprazole 40 mg in the a.m. + famotidine 40 mg in the p.m.  3. Treat possible infection:   A. Azithromycin 500 mg - 1 tablet 1 time per day for 3 days   4. If needed:   A. ProAir HFA 2 puffs every 4-6 hours  B. nasal saline washes  C. OTC antihistamine  D. Mucinex DM 2 tablets twice a day  E. Duoneb nebulized every 4-6 hours if needed.  F. Nasacort one spray each nostril one time per day  G. Acyclovir 200 - 4 times a day during fever blister outbreak  4.  Continue oxygen at 2 liters [place] administration  5.  Continue Respirtech percussion vest  6.  Blood - CBC w/d, CMP, cortisol, BNP  7.  Return to clinic in 4 weeks or earlier if problem  Cirilo is just not doing well.  Certainly he has some type of process ongoing within his lower respiratory tract that has always been poorly defined but may involve some degree of fibrosis and certainly some degree of inflammation.  As well, I suspect he now has adrenal insufficiency because of all the systemic steroids that he has received in the past.  Whenever he receives a systemic steroid he feels much better and operates much better but when he is without any systemic steroids for several weeks to a month he really gets very lethargic and does not perform very well.  We will start him on a daily dose of prednisone at 10 mg a day but for the next few days he will use a higher dose as noted above.  He will  continue on a collection of anti-inflammatory agents for his airway and I given him a broad-spectrum antibiotic in case this is a case of mycoplasma that presented recently.  He has a selection of agents to be utilized should they be required as noted above and  we will screen his blood for a worrisome systemic disease and also screen his random cortisol level as a screening of possible adrenal insufficiency.  At some point were going to need to work through a more thorough evaluation of possible adrenal insufficiency by checking his ACTH response but for today we just need to have his respiratory tract status and his overall health status addressed with the plan noted above.   Allena Katz, MD Allergy / Immunology [place]

## 2022-06-18 ENCOUNTER — Encounter: Payer: Self-pay | Admitting: Allergy and Immunology

## 2022-06-18 LAB — CBC WITH DIFFERENTIAL
Basophils Absolute: 0 10*3/uL (ref 0.0–0.2)
Basos: 0 %
EOS (ABSOLUTE): 0.1 10*3/uL (ref 0.0–0.4)
Eos: 1 %
Hematocrit: 48.4 % (ref 37.5–51.0)
Hemoglobin: 16.4 g/dL (ref 13.0–17.7)
Immature Grans (Abs): 0 10*3/uL (ref 0.0–0.1)
Immature Granulocytes: 0 %
Lymphocytes Absolute: 2 10*3/uL (ref 0.7–3.1)
Lymphs: 18 %
MCH: 32.2 pg (ref 26.6–33.0)
MCHC: 33.9 g/dL (ref 31.5–35.7)
MCV: 95 fL (ref 79–97)
Monocytes Absolute: 1 10*3/uL — ABNORMAL HIGH (ref 0.1–0.9)
Monocytes: 9 %
Neutrophils Absolute: 7.8 10*3/uL — ABNORMAL HIGH (ref 1.4–7.0)
Neutrophils: 72 %
RBC: 5.09 x10E6/uL (ref 4.14–5.80)
RDW: 13.5 % (ref 11.6–15.4)
WBC: 10.9 10*3/uL — ABNORMAL HIGH (ref 3.4–10.8)

## 2022-06-18 LAB — COMPREHENSIVE METABOLIC PANEL
ALT: 17 IU/L (ref 0–44)
AST: 22 IU/L (ref 0–40)
Albumin/Globulin Ratio: 2 (ref 1.2–2.2)
Albumin: 4.8 g/dL (ref 3.8–4.8)
Alkaline Phosphatase: 67 IU/L (ref 44–121)
BUN/Creatinine Ratio: 14 (ref 10–24)
BUN: 20 mg/dL (ref 8–27)
Bilirubin Total: 0.9 mg/dL (ref 0.0–1.2)
CO2: 27 mmol/L (ref 20–29)
Calcium: 9.7 mg/dL (ref 8.6–10.2)
Chloride: 98 mmol/L (ref 96–106)
Creatinine, Ser: 1.48 mg/dL — ABNORMAL HIGH (ref 0.76–1.27)
Globulin, Total: 2.4 g/dL (ref 1.5–4.5)
Glucose: 117 mg/dL — ABNORMAL HIGH (ref 70–99)
Potassium: 3.8 mmol/L (ref 3.5–5.2)
Sodium: 141 mmol/L (ref 134–144)
Total Protein: 7.2 g/dL (ref 6.0–8.5)
eGFR: 48 mL/min/{1.73_m2} — ABNORMAL LOW (ref >59)

## 2022-06-18 LAB — BRAIN NATRIURETIC PEPTIDE: BNP: 15.1 pg/mL (ref 0.0–100.0)

## 2022-06-18 LAB — CORTISOL: Cortisol: 18.2 ug/dL (ref 6.2–19.4)

## 2022-06-21 ENCOUNTER — Other Ambulatory Visit: Payer: Self-pay | Admitting: Internal Medicine

## 2022-06-21 DIAGNOSIS — M1000 Idiopathic gout, unspecified site: Secondary | ICD-10-CM

## 2022-06-30 ENCOUNTER — Other Ambulatory Visit: Payer: Self-pay | Admitting: Internal Medicine

## 2022-06-30 DIAGNOSIS — E785 Hyperlipidemia, unspecified: Secondary | ICD-10-CM

## 2022-06-30 DIAGNOSIS — M1000 Idiopathic gout, unspecified site: Secondary | ICD-10-CM

## 2022-07-08 ENCOUNTER — Other Ambulatory Visit: Payer: Self-pay | Admitting: Allergy and Immunology

## 2022-07-10 ENCOUNTER — Encounter: Payer: Self-pay | Admitting: Internal Medicine

## 2022-07-10 ENCOUNTER — Ambulatory Visit (INDEPENDENT_AMBULATORY_CARE_PROVIDER_SITE_OTHER): Payer: Medicare Other | Admitting: Internal Medicine

## 2022-07-10 VITALS — BP 130/74 | HR 86 | Temp 98.3°F | Ht 68.0 in | Wt 204.0 lb

## 2022-07-10 DIAGNOSIS — I10 Essential (primary) hypertension: Secondary | ICD-10-CM

## 2022-07-10 DIAGNOSIS — M1000 Idiopathic gout, unspecified site: Secondary | ICD-10-CM

## 2022-07-10 DIAGNOSIS — E785 Hyperlipidemia, unspecified: Secondary | ICD-10-CM

## 2022-07-10 DIAGNOSIS — N1831 Chronic kidney disease, stage 3a: Secondary | ICD-10-CM

## 2022-07-10 DIAGNOSIS — E118 Type 2 diabetes mellitus with unspecified complications: Secondary | ICD-10-CM | POA: Diagnosis not present

## 2022-07-10 LAB — LIPID PANEL
Cholesterol: 151 mg/dL (ref 0–200)
HDL: 62 mg/dL (ref >39.00)
NonHDL: 89.26
Total CHOL/HDL Ratio: 2
Triglycerides: 226 mg/dL — ABNORMAL HIGH (ref 0.0–149.0)
VLDL: 45.2 mg/dL — ABNORMAL HIGH (ref 0.0–40.0)

## 2022-07-10 LAB — LDL CHOLESTEROL, DIRECT: Direct LDL: 64 mg/dL

## 2022-07-10 LAB — HEMOGLOBIN A1C: Hgb A1c MFr Bld: 6.1 % (ref 4.6–6.5)

## 2022-07-10 LAB — TSH: TSH: 1.89 u[IU]/mL (ref 0.35–5.50)

## 2022-07-10 LAB — URIC ACID: Uric Acid, Serum: 4.5 mg/dL (ref 4.0–7.8)

## 2022-07-10 MED ORDER — ATORVASTATIN CALCIUM 20 MG PO TABS: 20.0000 mg | ORAL_TABLET | Freq: Every day | ORAL | 1 refills | Status: DC

## 2022-07-10 NOTE — Progress Notes (Signed)
Subjective:  Patient ID: Edwin [name], male    DOB: [birth year]  Age: 82 y.o. MRN: [id number]  CC: COPD, Hypertension, and Diabetes   HPI Edwin [name] presents for f/up -  He has a history of cough productive of purulent phlegm but for the last 5 days the cough has been productive of clear phlegm.  He has an unchanged degree of shortness of breath.  He denies chest pain, hemoptysis, fever, chills, or night sweats.  Outpatient Medications Prior to Visit  Medication Sig Dispense Refill   acyclovir (ZOVIRAX) 200 MG capsule Take 1 capsule (200 mg total) by mouth 4 (four) times daily. 120 capsule 5   albuterol (VENTOLIN HFA) 108 (90 Base) MCG/ACT inhaler Inhale 2 puffs into the lungs every 4 (four) hours as needed for wheezing or shortness of breath. INHALE 1-2 PUFFS INTO THE LUNGS EVERY 4 HOURS AS NEEDED FOR WHEEZING OR SHORTNESS OF BREATH 18 g 1   allopurinol (ZYLOPRIM) 300 MG tablet TAKE 1 TABLET BY MOUTH EVERY DAY 90 tablet 0   aspirin (ASPIRIN CHILDRENS) 81 MG chewable tablet Chew 1 tablet (81 mg total) by mouth daily.     betamethasone dipropionate 0.05 % cream Apply topically.     brimonidine (ALPHAGAN P) 0.1 % SOLN Place 1 drop into both eyes 2 (two) times daily. Both eyes     brimonidine (ALPHAGAN) 0.2 % ophthalmic solution      Cyanocobalamin (VITAMIN B 12 PO) Take 1 tablet by mouth daily.     dextromethorphan-guaiFENesin (MUCINEX DM) 30-600 MG 12hr tablet Take 1 tablet by mouth 2 (two) times daily as needed for cough. 60 tablet 3   famotidine (PEPCID) 40 MG tablet Take 1 tablet (40 mg total) by mouth at bedtime. 90 tablet 1   FLUoxetine (PROZAC) 10 MG capsule TAKE 1 CAPSULE BY MOUTH EVERY DAY 90 capsule 0   Fluticasone-Umeclidin-Vilant (TRELEGY ELLIPTA) 200-62.5-25 MCG/ACT AEPB Inhale 1 Inhalation into the lungs daily. 60 each 5   ipratropium-albuterol (DUONEB) 0.5-2.5 (3) MG/3ML SOLN Take 3 mLs by nebulization every 6 (six) hours as needed. 150 mL 2   levocetirizine (XYZAL) 5  MG tablet Take 1 tablet (5 mg total) by mouth daily as needed for allergies (Can take an extra dose during flare). 90 tablet 1   LUMIGAN 0.01 % SOLN      mometasone (ELOCON) 0.1 % ointment Apply topically once daily as needed 45 g 1   olmesartan (BENICAR) 40 MG tablet Take 1 tablet (40 mg total) by mouth daily. 90 tablet 1   pantoprazole (PROTONIX) 40 MG tablet Take 1 tablet (40 mg total) by mouth in the morning. TAKE 1 TABLET BY MOUTH EVERY DAY IN THE MORNING 90 tablet 1   predniSONE (DELTASONE) 10 MG tablet TAKE 1 TABLET BY MOUTH EVERY DAY 23 tablet 3   psyllium (METAMUCIL) 58.6 % powder Take 1 packet by mouth daily.     triamcinolone (NASACORT) 55 MCG/ACT AERO nasal inhaler Place 1 spray into the nose daily. 17 g 5   atorvastatin (LIPITOR) 20 MG tablet TAKE 1 TABLET BY MOUTH EVERY DAY 90 tablet 0   azithromycin (ZITHROMAX) 500 MG tablet Take 1 tablet once a day for 3 days 3 tablet 0   predniSONE (DELTASONE) 10 MG tablet 1 tab daily x 14 days, then 1/2 tab daily x 14 days 21 tablet 0   No facility-administered medications prior to visit.    ROS Review of Systems  Constitutional: Negative.  Negative for chills, diaphoresis,  fatigue and fever.  HENT:  Positive for hearing loss.   Eyes: Negative.   Respiratory:  Positive for cough. Negative for chest tightness, shortness of breath and wheezing.   Cardiovascular:  Negative for chest pain, palpitations and leg swelling.  Gastrointestinal:  Negative for abdominal pain, diarrhea, nausea and vomiting.  Endocrine: Negative.   Genitourinary: Negative.  Negative for difficulty urinating.  Musculoskeletal: Negative.  Negative for arthralgias, joint swelling and myalgias.  Skin: Negative.   Neurological:  Negative for dizziness, weakness and light-headedness.  Hematological:  Negative for adenopathy. Does not bruise/bleed easily.  Psychiatric/Behavioral:  Positive for confusion and decreased concentration.     Objective:  BP 130/74 (BP  Location: Right Arm, Patient Position: Sitting, Cuff Size: Large)   Pulse 86   Temp 98.3 F (36.8 C) (Oral)   Ht 5' 8"$  (1.727 m)   Wt 204 lb (92.5 kg)   SpO2 90%   BMI 31.02 kg/m   BP Readings from Last 3 Encounters:  07/10/22 130/74  06/17/22 126/68  06/04/22 126/74    Wt Readings from Last 3 Encounters:  07/10/22 204 lb (92.5 kg)  06/17/22 202 lb 4.8 oz (91.8 kg)  06/04/22 208 lb 8 oz (94.6 kg)    Physical Exam Vitals reviewed.  Constitutional:      General: He is not in acute distress.    Appearance: He is not ill-appearing, toxic-appearing or diaphoretic.  HENT:     Nose: Nose normal.     Mouth/Throat:     Mouth: Mucous membranes are moist.  Eyes:     General: No scleral icterus.    Conjunctiva/sclera: Conjunctivae normal.  Cardiovascular:     Rate and Rhythm: Normal rate and regular rhythm.     Heart sounds: No murmur heard.    No gallop.  Pulmonary:     Breath sounds: Examination of the right-lower field reveals rales. Examination of the left-lower field reveals rales. Rales present. No decreased breath sounds, wheezing or rhonchi.  Abdominal:     General: Abdomen is flat.     Palpations: There is no mass.     Tenderness: There is no abdominal tenderness. There is no guarding.     Hernia: No hernia is present.  Musculoskeletal:        General: Normal range of motion.     Cervical back: Neck supple.     Right lower leg: No edema.     Left lower leg: No edema.  Lymphadenopathy:     Cervical: No cervical adenopathy.  Skin:    General: Skin is warm and dry.  Neurological:     General: No focal deficit present.     Mental Status: He is alert.  Psychiatric:        Attention and Perception: He is inattentive.        Mood and Affect: Mood normal.        Behavior: Behavior normal.     Lab Results  Component Value Date   WBC 10.9 (H) 06/17/2022   HGB 16.4 06/17/2022   HCT 48.4 06/17/2022   PLT 155.0 05/21/2021   GLUCOSE 117 (H) 06/17/2022   CHOL 151  07/10/2022   TRIG 226.0 (H) 07/10/2022   HDL 62.00 07/10/2022   LDLDIRECT 64.0 07/10/2022   LDLCALC 43 09/08/2019   ALT 17 06/17/2022   AST 22 06/17/2022   NA 141 06/17/2022   K 3.8 06/17/2022   CL 98 06/17/2022   CREATININE 1.48 (H) 06/17/2022   BUN 20 06/17/2022  CO2 27 06/17/2022   TSH 1.89 07/10/2022   PSA 0.87 05/05/2016   HGBA1C 6.1 07/10/2022    DG Chest 2 View  Result Date: 05/10/2022 CLINICAL DATA:  Cough and shortness of breath EXAM: CHEST - 2 VIEW COMPARISON:  Chest radiograph dated 05/21/2021 FINDINGS: Similar to slightly increased asymmetric elevation of the right hemidiaphragm. Normal left lung volume. No focal consolidations. Again seen are subpleural reticulations in the right lung. No pleural effusion or pneumothorax. The heart size and mediastinal contours are within normal limits. Partially imaged cervical spinal fixation hardware appears intact. IMPRESSION: 1. Similar to slightly increased asymmetric elevation of the right hemidiaphragm. 2. Persistent subpleural reticulations in the right lung, which may be related to interstitial lung disease. No focal consolidations. Electronically Signed   By: Darrin Nipper M.D.   On: 05/10/2022 17:25    Assessment & Plan:   Teagon was seen today for copd, hypertension and diabetes.  Diagnoses and all orders for this visit:  Essential hypertension- Her blood pressure is well-controlled. -     TSH; Future -     Urinalysis, Routine w reflex microscopic; Future -     Urinalysis, Routine w reflex microscopic -     TSH  Type II diabetes mellitus with manifestations ([place])- His blood sugar is adequately well-controlled. -     Hemoglobin A1c; Future -     Hemoglobin A1c  Stage 3a chronic kidney disease ([place])- Renal function is stable. -     Urinalysis, Routine w reflex microscopic; Future -     Urinalysis, Routine w reflex microscopic  Idiopathic gout, unspecified chronicity, unspecified site- He has achieved his uric acid  goal. -     Uric acid; Future -     Uric acid  Hyperlipidemia with target LDL less than 130- LDL goal achieved. Doing well on the statin  -     Lipid panel; Future -     TSH; Future -     TSH -     Lipid panel -     atorvastatin (LIPITOR) 20 MG tablet; Take 1 tablet (20 mg total) by mouth daily.  Other orders -     LDL cholesterol, direct   I have discontinued Edwin [name]'s azithromycin. I have also changed his atorvastatin. Additionally, I am having him maintain his brimonidine, psyllium, Lumigan, Cyanocobalamin (VITAMIN B 12 PO), aspirin, mometasone, betamethasone dipropionate, acyclovir, brimonidine, dextromethorphan-guaiFENesin, triamcinolone, ipratropium-albuterol, Trelegy Ellipta, pantoprazole, famotidine, albuterol, levocetirizine, olmesartan, FLUoxetine, allopurinol, and predniSONE.  Meds ordered this encounter  Medications   atorvastatin (LIPITOR) 20 MG tablet    Sig: Take 1 tablet (20 mg total) by mouth daily.    Dispense:  90 tablet    Refill:  1     Follow-up: No follow-ups on file.  Scarlette Calico, MD

## 2022-07-11 LAB — URINALYSIS, ROUTINE W REFLEX MICROSCOPIC
Bilirubin Urine: NEGATIVE
Hgb urine dipstick: NEGATIVE
Leukocytes,Ua: NEGATIVE
Nitrite: NEGATIVE
Specific Gravity, Urine: 1.025 (ref 1.000–1.030)
Total Protein, Urine: NEGATIVE
Urine Glucose: NEGATIVE
Urobilinogen, UA: 0.2 (ref 0.0–1.0)
pH: 5.5 (ref 5.0–8.0)

## 2022-07-11 NOTE — Progress Notes (Signed)
L/m again to contact me

## 2022-07-15 ENCOUNTER — Ambulatory Visit (INDEPENDENT_AMBULATORY_CARE_PROVIDER_SITE_OTHER): Payer: Medicare Other | Admitting: Allergy and Immunology

## 2022-07-15 VITALS — BP 126/78 | HR 84 | Temp 98.8°F | Resp 14 | Ht 69.5 in | Wt 206.7 lb

## 2022-07-15 DIAGNOSIS — K219 Gastro-esophageal reflux disease without esophagitis: Secondary | ICD-10-CM | POA: Diagnosis not present

## 2022-07-15 DIAGNOSIS — J4489 Other specified chronic obstructive pulmonary disease: Secondary | ICD-10-CM | POA: Diagnosis not present

## 2022-07-15 DIAGNOSIS — E2740 Unspecified adrenocortical insufficiency: Secondary | ICD-10-CM

## 2022-07-15 DIAGNOSIS — J3089 Other allergic rhinitis: Secondary | ICD-10-CM

## 2022-07-15 DIAGNOSIS — J986 Disorders of diaphragm: Secondary | ICD-10-CM

## 2022-07-15 DIAGNOSIS — J479 Bronchiectasis, uncomplicated: Secondary | ICD-10-CM

## 2022-07-15 DIAGNOSIS — B001 Herpesviral vesicular dermatitis: Secondary | ICD-10-CM

## 2022-07-15 MED ORDER — ACYCLOVIR 200 MG PO CAPS: ORAL_CAPSULE | ORAL | 5 refills | Status: AC

## 2022-07-15 MED ORDER — PREDNISONE 10 MG PO TABS: 10.0000 mg | ORAL_TABLET | Freq: Every day | ORAL | 3 refills | Status: DC

## 2022-07-15 MED ORDER — FAMOTIDINE 40 MG PO TABS: 40.0000 mg | ORAL_TABLET | Freq: Every day | ORAL | 1 refills | Status: DC

## 2022-07-15 MED ORDER — TRELEGY ELLIPTA 200-62.5-25 MCG/ACT IN AEPB: 1.0000 | INHALATION_SPRAY | Freq: Every day | RESPIRATORY_TRACT | 5 refills | Status: DC

## 2022-07-15 MED ORDER — ALBUTEROL SULFATE HFA 108 (90 BASE) MCG/ACT IN AERS: 2.0000 | INHALATION_SPRAY | RESPIRATORY_TRACT | 1 refills | Status: DC | PRN

## 2022-07-15 MED ORDER — PANTOPRAZOLE SODIUM 40 MG PO TBEC: 40.0000 mg | DELAYED_RELEASE_TABLET | Freq: Every morning | ORAL | 1 refills | Status: DC

## 2022-07-15 NOTE — Patient Instructions (Addendum)
   1. Continue to Treat inflammation:   A. Trelegy 200 -1 inhalation 1 time per day (empty lungs)  B. Prednisone 10 mg daily, every day  2. Continue to Treat reflux / LPR:   A. Pantoprazole 40 mg in the a.m. + famotidine 40 mg in the p.m.  3.  Use percussion vest multiple times a week and can go up to 3 times per day if needed:  4. If needed:   A. ProAir HFA 2 puffs or Duoneb nebulization every 4-6 hours  B. nasal saline washes  C. OTC antihistamine  D. Mucinex DM 2 tablets twice a day  E. Nasacort one spray each nostril one time per day  G. Acyclovir 200 - 4 times a day during fever blister outbreak  H. oxygen at 2 liters [place]   5. Return to clinic in 8 weeks or earlier if problem. Taper prednisone ???

## 2022-07-15 NOTE — Progress Notes (Unsigned)
[place]   Follow-up Note  Referring Provider: Janith Lima, MD Primary Provider: Janith Lima, MD Date of Office Visit: 07/15/2022  Subjective:   Edwin [name] (DOB: 10-03-1940) is a 82 y.o. male who returns to the [facility] [place] on 07/15/2022 in re-evaluation of the following:  HPI: Truxton returns to this clinic in evaluation of COPD/asthma overlap, bronchiectasis, right hemiparesis of diaphragm, allergic rhinitis, history of chronic sinusitis, history of LPR, and history of intermittent herpes labialis, and suspected iatrogenic adrenal insufficiency.  I last saw him in this clinic 17 June 2022.  He is much better since his last visit.  Although he still has some cough and some phlegm production and occasionally this can be colored his cough is diminished and he can sleep without any difficulty and does not need to use a short acting bronchodilator.  For some reason he is not using his vibratory percussion vest.  He still continues on trilogy and he still continues on prednisone currently at 10 mg daily.  Overall he feels much better and has more energy and this is a significant improvement for him at this point on his current plan.  He will still occasionally use oxygen during exertion and sometimes while at rest.  He had very little problems with his nose.  He had no problems with reflux.  He has not had any outbreaks of fever blister.  Allergies as of 07/15/2022       Reactions   Ramipril Other (See Comments)   REACTION: chest congestion & cough. No angioedema        Medication List    acyclovir 200 MG capsule Commonly known as: Zovirax Take 1 capsule (200 mg total) by mouth 4 (four) times daily.   albuterol 108 (90 Base) MCG/ACT inhaler Commonly known as: Ventolin HFA Inhale 2 puffs into the lungs every 4 (four) hours as needed for wheezing or shortness of breath. INHALE 1-2 PUFFS INTO THE LUNGS  EVERY 4 HOURS AS NEEDED FOR WHEEZING OR SHORTNESS OF BREATH   allopurinol 300 MG tablet Commonly known as: ZYLOPRIM TAKE 1 TABLET BY MOUTH EVERY DAY   aspirin 81 MG chewable tablet Commonly known as: Aspirin Childrens Chew 1 tablet (81 mg total) by mouth daily.   atorvastatin 20 MG tablet Commonly known as: LIPITOR Take 1 tablet (20 mg total) by mouth daily.   betamethasone dipropionate 0.05 % cream Apply topically.   brimonidine 0.1 % Soln Commonly known as: ALPHAGAN P Place 1 drop into both eyes 2 (two) times daily. Both eyes   brimonidine 0.2 % ophthalmic solution Commonly known as: ALPHAGAN   dextromethorphan-guaiFENesin 30-600 MG 12hr tablet Commonly known as: MUCINEX DM Take 1 tablet by mouth 2 (two) times daily as needed for cough.   famotidine 40 MG tablet Commonly known as: PEPCID Take 1 tablet (40 mg total) by mouth at bedtime.   FLUoxetine 10 MG capsule Commonly known as: PROZAC TAKE 1 CAPSULE BY MOUTH EVERY DAY   ipratropium-albuterol 0.5-2.5 (3) MG/3ML Soln Commonly known as: DUONEB Take 3 mLs by nebulization every 6 (six) hours as needed.   levocetirizine 5 MG tablet Commonly known as: XYZAL Take 1 tablet (5 mg total) by mouth daily as needed for allergies (Can take an extra dose during flare).   Lumigan 0.01 % Soln Generic drug: bimatoprost   mometasone 0.1 % ointment Commonly known as: ELOCON Apply topically once daily as needed  olmesartan 40 MG tablet Commonly known as: BENICAR Take 1 tablet (40 mg total) by mouth daily.   pantoprazole 40 MG tablet Commonly known as: PROTONIX Take 1 tablet (40 mg total) by mouth in the morning. TAKE 1 TABLET BY MOUTH EVERY DAY IN THE MORNING   predniSONE 10 MG tablet Commonly known as: DELTASONE TAKE 1 TABLET BY MOUTH EVERY DAY   psyllium 58.6 % powder Commonly known as: METAMUCIL Take 1 packet by mouth daily.   Trelegy Ellipta 200-62.5-25 MCG/ACT Aepb Generic drug:  Fluticasone-Umeclidin-Vilant Inhale 1 Inhalation into the lungs daily.   triamcinolone 55 MCG/ACT Aero nasal inhaler Commonly known as: NASACORT Place 1 spray into the nose daily.   VITAMIN B 12 PO Take 1 tablet by mouth daily.    Past Medical History:  Diagnosis Date   Asthma    COPD (chronic obstructive pulmonary disease) ([place])    reactive airway disease   Glaucoma    Gout    Hx of colonic polyps    Dr Earlean Shawl   Hyperlipidemia    NMR lipoprofile 2005: LDL 113(1416/825), HLD 37, TG 190.   Hypertension    Pneumonia     Past Surgical History:  Procedure Laterality Date   ANTERIOR CERVICAL DECOMP/DISCECTOMY FUSION N/A 03/08/2018   Procedure: ANTERIOR CERVICAL DECOMPRESSION/DISCECTOMY FUSION, INTERBODY PROSTHESIS,PLATE/SCREWS CERVICAL FOUR- CERVICAL FIVE, CERVICAL FIVE- CERVICAL SIX, CERVICAL SIX- CERVICAL SEVEN;  Surgeon: Newman Pies, MD;  Location: [employer];  Service: Neurosurgery;  Laterality: N/A;  anterior   CATARACT EXTRACTION     bilat, implants   [place]   POLYPECTOMY  2003    neg 2006, Dr.Medoff   tear duct surgery  1998   for excess tearing, skin graft L foot @ age 65 1/2    Review of systems negative except as noted in HPI / PMHx or noted below:  Review of Systems  Constitutional: Negative.   HENT: Negative.    Eyes: Negative.   Respiratory: Negative.    Cardiovascular: Negative.   Gastrointestinal: Negative.   Genitourinary: Negative.   Musculoskeletal: Negative.   Skin: Negative.   Neurological: Negative.   Endo/Heme/Allergies: Negative.   Psychiatric/Behavioral: Negative.       Objective:   Vitals:   07/15/22 0836  BP: 126/78  Pulse: 84  Resp: 14  Temp: 98.8 F (37.1 C)  SpO2: 94% on Room Air at rest   Height: 5' 9.5" (176.5 cm)  Weight: 206 lb 11.2 oz (93.8 kg)   Physical Exam Constitutional:      Appearance: He is not diaphoretic.  HENT:     Head: Normocephalic.     Right Ear: Tympanic  membrane, ear canal and external ear normal.     Left Ear: Tympanic membrane, ear canal and external ear normal.     Nose: Nose normal. No mucosal edema or rhinorrhea.     Mouth/Throat:     Pharynx: Uvula midline. No oropharyngeal exudate.  Eyes:     Conjunctiva/sclera: Conjunctivae normal.  Neck:     Thyroid: No thyromegaly.     Trachea: Trachea normal. No tracheal tenderness or tracheal deviation.  Cardiovascular:     Rate and Rhythm: Normal rate and regular rhythm.     Heart sounds: Normal heart sounds, S1 normal and S2 normal. No murmur heard. Pulmonary:     Effort: No respiratory distress.     Breath sounds: Normal breath sounds. No stridor. No wheezing or rales.  Lymphadenopathy:  Head:     Right side of head: No tonsillar adenopathy.     Left side of head: No tonsillar adenopathy.     Cervical: No cervical adenopathy.  Skin:    Findings: No erythema or rash.     Nails: There is no clubbing.  Neurological:     Mental Status: He is alert.     Diagnostics:    Spirometry was performed and demonstrated an FEV1 of 1.78 at 64 % of predicted.  Result of blood tests obtained 17 June 2022 identifies creatinine 1.48 MGs/DL, AST 22 U/L, ALT 17 U/L, WBC 10.9, absolute eosinophil 100, absolute lymphocyte 2000, hemoglobin 16.4, cortisol 18.2 UG/DL, BNP 15.1 PG/mL  Assessment and Plan:   1. COPD with asthma   2. Bronchiectasis without complication (HCC)   3. Other allergic rhinitis   4. LPRD (laryngopharyngeal reflux disease)   5. Diaphragm dysfunction   6. Herpes labialis   7. Adrenal insufficiency ([place])    1. Continue to Treat inflammation:   A. Trelegy 200 -1 inhalation 1 time per day (empty lungs)  B. Prednisone 10 mg daily, every day  2. Continue to Treat reflux / LPR:   A. Pantoprazole 40 mg in the a.m. + famotidine 40 mg in the p.m.  3.  Use percussion vest multiple times a week and can go up to 3 times per day if needed:  4. If needed:   A. ProAir HFA 2  puffs or Duoneb nebulization every 4-6 hours  B. nasal saline washes  C. OTC antihistamine  D. Mucinex DM 2 tablets twice a day  E. Nasacort one spray each nostril one time per day  G. Acyclovir 200 - 4 times a day during fever blister outbreak  H. oxygen at 2 liters [place]   5. Return to clinic in 8 weeks or earlier if problem. Taper prednisone ???  Dakota appears to be doing quite well regarding his inflammatory respiratory tract condition and apparent iatrogenic adrenal insufficiency while utilizing prednisone at 10 mg daily on a consistent basis.  I would like for him to utilize the plan noted above for an additional 8 weeks which will give him 12 weeks of prednisone at 10 mg daily.  At that point in time we will attempt to taper down his prednisone but based upon his previous history I do not think that he will be able to come off prednisone completely.  We will aim for somewhere between 5 to 7-1/2 mg daily of daily prednisone.  I did encourage him to use his percussion vest a few times per week and hopefully on a daily basis as he does have bronchiectasis and has problems moving mucus around in his chest.  He will continue to aggressively treat his reflux.  I will see him back in this clinic in 8 weeks or earlier if there is a problem.   Allena Katz, MD Allergy / Immunology [place]

## 2022-07-16 ENCOUNTER — Encounter: Payer: Self-pay | Admitting: Allergy and Immunology

## 2022-07-29 ENCOUNTER — Ambulatory Visit (INDEPENDENT_AMBULATORY_CARE_PROVIDER_SITE_OTHER): Payer: Medicare Other | Admitting: Podiatry

## 2022-07-29 DIAGNOSIS — B351 Tinea unguium: Secondary | ICD-10-CM

## 2022-07-29 DIAGNOSIS — E118 Type 2 diabetes mellitus with unspecified complications: Secondary | ICD-10-CM

## 2022-07-29 DIAGNOSIS — M79674 Pain in right toe(s): Secondary | ICD-10-CM | POA: Diagnosis not present

## 2022-07-29 DIAGNOSIS — M79675 Pain in left toe(s): Secondary | ICD-10-CM

## 2022-07-29 NOTE — Progress Notes (Signed)
This patient presents  to my office for at risk foot care.  This patient requires this care by a professional since this patient will be at risk due to having diabetes and stage 3a CKD.  This patient is unable to cut nails himself since the patient cannot reach his nails.These nails are painful walking and wearing shoes.   This patient presents for at risk foot care today.  General Appearance  Alert, conversant and in no acute stress.  Vascular  Dorsalis pedis and posterior tibial  pulses are palpable  bilaterally.  Capillary return is within normal limits  bilaterally. Temperature is within normal limits  bilaterally.  Neurologic  Senn-Weinstein monofilament wire test within normal limits  bilaterally. Muscle power within normal limits bilaterally.  Nails Thick disfigured discolored nails with subungual debris  from hallux to fifth toes bilaterally. No evidence of bacterial infection or drainage bilaterally.  Orthopedic  No limitations of motion  feet .  No crepitus or effusions noted.  No bony pathology or digital deformities noted. Mild  HAV  B/L.  DJD 1st  MPJ  B/L.  Skin  normotropic skin with no porokeratosis noted bilaterally.  No signs of infections or ulcers noted.     Onychomycosis  Pain in right toes  Pain in left toes  Consent was obtained for treatment procedures.   Mechanical debridement of nails 1-5  bilaterally performed with a nail nipper.  Filed with dremel without incident.    Return office visit  10 weeks                   Told patient to return for periodic foot care and evaluation due to potential at risk complications.   Gardiner Barefoot DPM

## 2022-09-09 ENCOUNTER — Ambulatory Visit (INDEPENDENT_AMBULATORY_CARE_PROVIDER_SITE_OTHER): Payer: Medicare Other | Admitting: Allergy and Immunology

## 2022-09-09 VITALS — BP 102/64

## 2022-09-09 DIAGNOSIS — J986 Disorders of diaphragm: Secondary | ICD-10-CM | POA: Diagnosis not present

## 2022-09-09 DIAGNOSIS — J479 Bronchiectasis, uncomplicated: Secondary | ICD-10-CM

## 2022-09-09 DIAGNOSIS — E2740 Unspecified adrenocortical insufficiency: Secondary | ICD-10-CM | POA: Diagnosis not present

## 2022-09-09 DIAGNOSIS — J4489 Other specified chronic obstructive pulmonary disease: Secondary | ICD-10-CM

## 2022-09-09 DIAGNOSIS — J3089 Other allergic rhinitis: Secondary | ICD-10-CM | POA: Diagnosis not present

## 2022-09-09 DIAGNOSIS — K219 Gastro-esophageal reflux disease without esophagitis: Secondary | ICD-10-CM

## 2022-09-09 DIAGNOSIS — B001 Herpesviral vesicular dermatitis: Secondary | ICD-10-CM | POA: Diagnosis not present

## 2022-09-09 MED ORDER — LEVOCETIRIZINE DIHYDROCHLORIDE 5 MG PO TABS: 5.0000 mg | ORAL_TABLET | Freq: Every day | ORAL | 1 refills | Status: DC | PRN

## 2022-09-09 MED ORDER — ALBUTEROL SULFATE HFA 108 (90 BASE) MCG/ACT IN AERS: 2.0000 | INHALATION_SPRAY | RESPIRATORY_TRACT | 1 refills | Status: DC | PRN

## 2022-09-09 MED ORDER — PANTOPRAZOLE SODIUM 40 MG PO TBEC: 40.0000 mg | DELAYED_RELEASE_TABLET | Freq: Every morning | ORAL | 1 refills | Status: DC

## 2022-09-09 MED ORDER — TRELEGY ELLIPTA 200-62.5-25 MCG/ACT IN AEPB: 1.0000 | INHALATION_SPRAY | Freq: Every day | RESPIRATORY_TRACT | 1 refills | Status: DC

## 2022-09-09 MED ORDER — FAMOTIDINE 40 MG PO TABS: 40.0000 mg | ORAL_TABLET | Freq: Every day | ORAL | 1 refills | Status: DC

## 2022-09-09 MED ORDER — PREDNISONE 10 MG PO TABS: ORAL_TABLET | ORAL | 3 refills | Status: DC

## 2022-09-09 MED ORDER — PREDNISONE 1 MG PO TABS: ORAL_TABLET | ORAL | 2 refills | Status: DC

## 2022-09-09 NOTE — Patient Instructions (Signed)
   1. Continue to Treat inflammation:   A. Trelegy 200 -1 inhalation 1 time per day (empty lungs)  2. Continue to Treat reflux / LPR:   A. Pantoprazole 40 mg in the a.m. + famotidine 40 mg in the p.m.  3.  If needed:   A. ProAir HFA 2 puffs or Duoneb nebulization every 4-6 hours  B. nasal saline washes  C. OTC antihistamine  D. Mucinex DM 2 tablets twice a day  E. Nasacort one spray each nostril one time per day  F. Acyclovir 200 - 4 times a day during fever blister outbreak  G. Oxygen at 2 liters [place]   H. Percussion vest   4. Taper prednisone using 10mg  and 1 mg tablets:   A. Decrease to 9 mg daily for 1 month then,  B. Decrease to 8 mg daily for 1 month then,  C. Decrease to 7 mg daily until return to clinic  5. Return to clinic in 12 weeks or earlier if problem.

## 2022-09-09 NOTE — Progress Notes (Unsigned)
[place]   Follow-up Note  Referring Provider: Etta GrandchildJones, Thomas L, MD Primary Provider: Etta GrandchildJones, Thomas L, MD Date of Office Visit: 09/09/2022  Subjective:   Edwin [name] (DOB: 1941/05/09) is a 82 y.o. male who [facility] in re-evaluation of the following:  HPI: Edwin [name] returns to this clinic in evaluation of COPD/asthma overlap, bronchiectasis, right hemiparesis of diaphragm, allergic rhinitis, history of chronic sinusitis, history of LPR, suspected iatrogenic adrenal insufficiency, and history of intermittent herpes labialis.  I last saw him in this clinic 15 July 2022.  Over the course the past 2 weeks he and his wife contracted a viral respiratory tract infection with rhinitis and cough.  He is improving regarding both of these organ systems and at this point time has no problems with his nose and still has some lingering cough but overall much improved.  He never had any fever and he was COVID-negative.  Prior to that point in time he was certainly doing a lot better with his chronic cough and his respiratory tract symptoms and overall is satisfied with the response he is receiving while utilizing a collection of anti-inflammatory agents for his airway including consistent use of prednisone at 10 mg daily.  He does use oxygen through the daytime on occasion.  He rarely uses any albuterol.  His reflux has been under very good control.  He has not had any fever blister outbreaks.  Allergies as of 09/09/2022       Reactions   Ramipril Other (See Comments)   REACTION: chest congestion & cough. No angioedema        Medication List    acyclovir 200 MG capsule Commonly known as: Zovirax Take 1 (ONE) tablet by mouth 4 times a day during fever blister outbreak.   albuterol 108 (90 Base) MCG/ACT inhaler Commonly known as: Ventolin HFA Inhale 2 puffs into the lungs every 4 (four) hours  as needed for wheezing or shortness of breath. INHALE 1-2 PUFFS INTO THE LUNGS EVERY 4 HOURS AS NEEDED FOR WHEEZING OR SHORTNESS OF BREATH   allopurinol 300 MG tablet Commonly known as: ZYLOPRIM TAKE 1 TABLET BY MOUTH EVERY DAY   aspirin 81 MG chewable tablet Commonly known as: Aspirin Childrens Chew 1 tablet (81 mg total) by mouth daily.   atorvastatin 20 MG tablet Commonly known as: LIPITOR Take 1 tablet (20 mg total) by mouth daily.   betamethasone dipropionate 0.05 % cream Apply topically.   brimonidine 0.1 % Soln Commonly known as: ALPHAGAN P Place 1 drop into both eyes 2 (two) times daily. Both eyes   brimonidine 0.2 % ophthalmic solution Commonly known as: ALPHAGAN   dextromethorphan-guaiFENesin 30-600 MG 12hr tablet Commonly known as: MUCINEX DM Take 1 tablet by mouth 2 (two) times daily as needed for cough.   famotidine 40 MG tablet Commonly known as: PEPCID Take 1 tablet (40 mg total) by mouth at bedtime.   FLUoxetine 10 MG capsule Commonly known as: PROZAC TAKE 1 CAPSULE BY MOUTH EVERY DAY   ipratropium-albuterol 0.5-2.5 (3) MG/3ML Soln Commonly known as: DUONEB Take 3 mLs by nebulization every 6 (six) hours as needed.   levocetirizine 5 MG tablet Commonly known as: XYZAL Take 1 tablet (5 mg total) by mouth daily as needed for allergies (Can take an extra dose during flare).   [place] 0.01 % Soln Generic drug: bimatoprost   mometasone 0.1 % ointment Commonly known as:  ELOCON Apply topically once daily as needed   olmesartan 40 MG tablet Commonly known as: BENICAR Take 1 tablet (40 mg total) by mouth daily.   pantoprazole 40 MG tablet Commonly known as: PROTONIX Take 1 tablet (40 mg total) by mouth in the morning. TAKE 1 TABLET BY MOUTH EVERY DAY IN THE MORNING   predniSONE 10 MG tablet Commonly known as: DELTASONE Take 1 tablet (10 mg total) by mouth daily.   psyllium 58.6 % powder Commonly known as: METAMUCIL Take 1 packet by mouth daily.    Trelegy Ellipta 200-62.5-25 MCG/ACT Aepb Generic drug: Fluticasone-Umeclidin-Vilant Inhale 1 Inhalation into the lungs daily.   triamcinolone 55 MCG/ACT Aero nasal inhaler Commonly known as: NASACORT Place 1 spray into the nose daily.   VITAMIN B 12 PO Take 1 tablet by mouth daily.    Past Medical History:  Diagnosis Date   Asthma    COPD (chronic obstructive pulmonary disease) (HCC)    reactive airway disease   Glaucoma    Gout    Hx of colonic polyps    Dr Kinnie Scales   Hyperlipidemia    NMR lipoprofile 2005: LDL 113(1416/825), HLD 37, TG 190.   Hypertension    Pneumonia     Past Surgical History:  Procedure Laterality Date   ANTERIOR CERVICAL DECOMP/DISCECTOMY FUSION N/A 03/08/2018   Procedure: ANTERIOR CERVICAL DECOMPRESSION/DISCECTOMY FUSION, INTERBODY PROSTHESIS,PLATE/SCREWS CERVICAL FOUR- CERVICAL FIVE, CERVICAL FIVE- CERVICAL SIX, CERVICAL SIX- CERVICAL SEVEN;  Surgeon: Tressie Stalker, MD;  Location: [facility] OR;  Service: Neurosurgery;  Laterality: N/A;  anterior   CATARACT EXTRACTION     bilat, implants   CHOLECYSTECTOMY  1995   LUMBAR LAMINECTOMY  1982   POLYPECTOMY  2003    neg 2006, Dr.Medoff   tear duct surgery  1998   for excess tearing, skin graft L foot @ age 10 1/2    Review of systems negative except as noted in HPI / PMHx or noted below:  Review of Systems  Constitutional: Negative.   HENT: Negative.    Eyes: Negative.   Respiratory: Negative.    Cardiovascular: Negative.   Gastrointestinal: Negative.   Genitourinary: Negative.   Musculoskeletal: Negative.   Skin: Negative.   Neurological: Negative.   Endo/Heme/Allergies: Negative.   Psychiatric/Behavioral: Negative.       Objective:   Vitals:   09/09/22 1119  BP: 102/64  SpO2: 100%          Physical Exam Constitutional:      Appearance: He is not diaphoretic.  HENT:     Head: Normocephalic.     Right Ear: Tympanic membrane, ear canal and external ear normal.     Left Ear: Tympanic  membrane, ear canal and external ear normal.     Nose: Nose normal. No mucosal edema or rhinorrhea.     Mouth/Throat:     Pharynx: Uvula midline. No oropharyngeal exudate.  Eyes:     Conjunctiva/sclera: Conjunctivae normal.  Neck:     Thyroid: No thyromegaly.     Trachea: Trachea normal. No tracheal tenderness or tracheal deviation.  Cardiovascular:     Rate and Rhythm: Normal rate and regular rhythm.     Heart sounds: Normal heart sounds, S1 normal and S2 normal. No murmur heard. Pulmonary:     Effort: No respiratory distress.     Breath sounds: Normal breath sounds. No stridor. No wheezing (Course expiratory rhonchi upper chest throat) or rales.  Lymphadenopathy:     Head:     Right side of  head: No tonsillar adenopathy.     Left side of head: No tonsillar adenopathy.     Cervical: No cervical adenopathy.  Skin:    Findings: No erythema or rash.     Nails: There is no clubbing.  Neurological:     Mental Status: He is alert.     Diagnostics:    Spirometry was performed and demonstrated an FEV1 of 1.75 at 63 % of predicted.  Assessment and Plan:   1. COPD with asthma   2. Bronchiectasis without complication   3. Diaphragm dysfunction   4. Other allergic rhinitis   5. LPRD (laryngopharyngeal reflux disease)   6. Herpes labialis   7. Adrenal insufficiency    1. Continue to Treat inflammation:   A. Trelegy 200 -1 inhalation 1 time per day (empty lungs)  2. Continue to Treat reflux / LPR:   A. Pantoprazole 40 mg in the a.m. + famotidine 40 mg in the p.m.  3.  If needed:   A. ProAir HFA 2 puffs or Duoneb nebulization every 4-6 hours  B. nasal saline washes  C. OTC antihistamine  D. Mucinex DM 2 tablets twice a day  E. Nasacort one spray each nostril one time per day  F. Acyclovir 200 - 4 times a day during fever blister outbreak  G. Oxygen at 2 liters [place]   H. Percussion vest   4. Taper prednisone using 10mg  and 1 mg tablets:   A. Decrease to 9 mg daily for 1  month then,  B. Decrease to 8 mg daily for 1 month then,  C. Decrease to 7 mg daily until return to clinic  5. Return to clinic in 12 weeks or earlier if problem.    Edwin [name] appears to be doing okay at this point in time even though he just underwent and resolved a viral respiratory tract infection and I believe we can begin to taper down some of his systemic steroid use.  Will try to get down to 7 mg over the course of the next 12 weeks using the protocol noted above.  He will contact me should he have significant problems in the face of this plan.   Edwin Schimke, MD Allergy / Immunology [facility]

## 2022-09-10 ENCOUNTER — Encounter: Payer: Self-pay | Admitting: Allergy and Immunology

## 2022-09-11 IMAGING — CT CT CHEST HIGH RESOLUTION W/O CM
2 of 7 series · 13 of 36 positions shown, 16 images · non-contrast
Comparison: 11/03/2018 chest CT. 02/08/2020 chest radiograph.

CLINICAL DATA: COPD. Former smoker. Wheezing. Follow-up possible
interstitial lung disease.

EXAM:
CT CHEST WITHOUT CONTRAST
TECHNIQUE: Multidetector CT imaging of the chest was performed following the
standard protocol without intravenous contrast. High resolution
imaging of the lungs, as well as inspiratory and expiratory imaging,
was performed.

[Series 5: chest 2.00 br36 s3 cor soft · coronal · 0.63mm/px · 3 of 176 slices shown]
[im 36/176  lung]
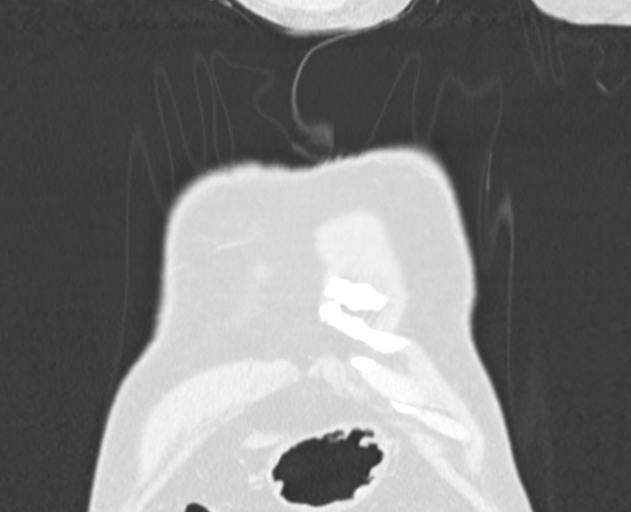
[im 71/176  lung]
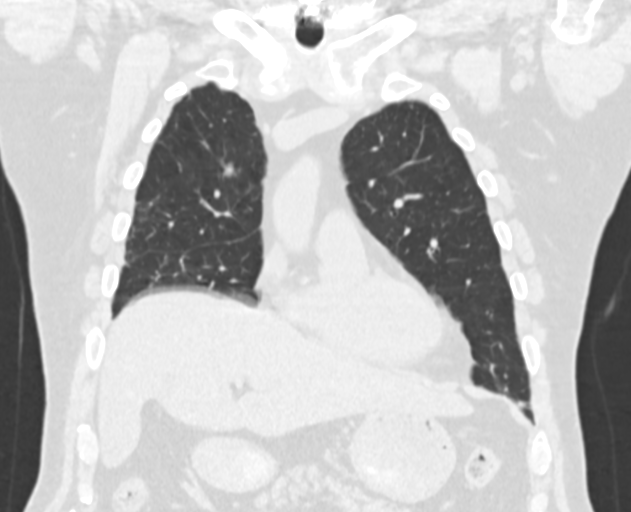
[im 106/176  lung]
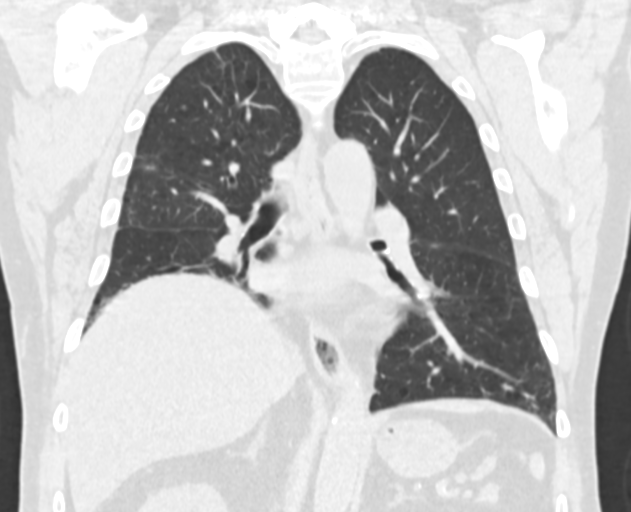

[Series 12: chest 1.00 br60 s3 high res thins 1x1 mm · axial · 0.70mm/px · z∈[+1706,+1978]mm · 10 of 322 slices shown, 13 images]
[im 25/322  mediastinal]
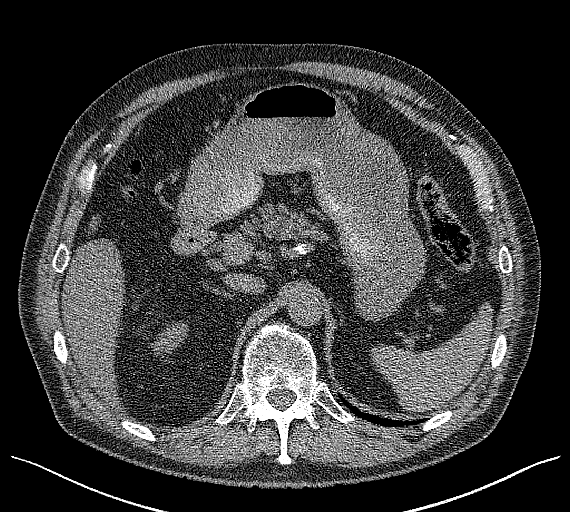
[im 25/322  lung]
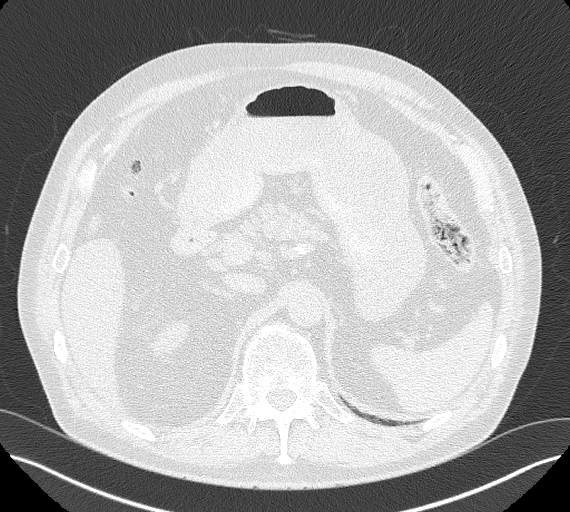
[im 50/322  lung]
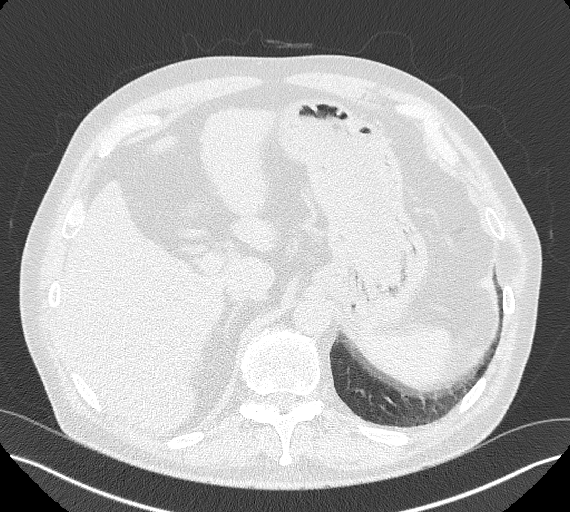
[im 99/322  lung]
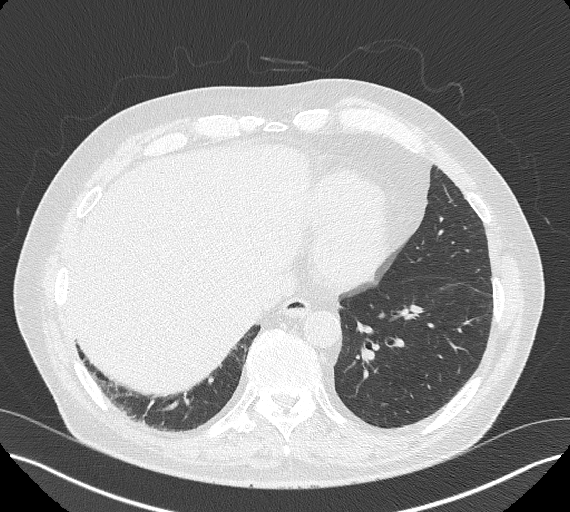
[im 124/322  lung]
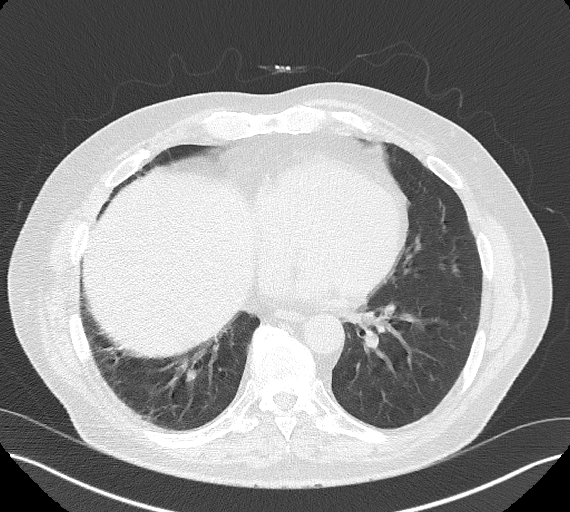
[im 149/322  mediastinal]
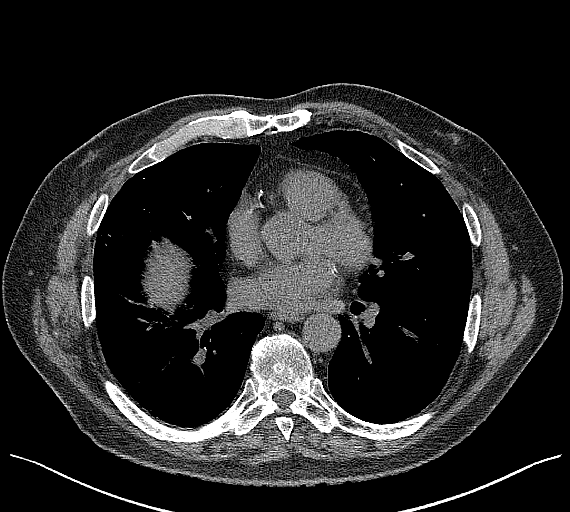
[im 149/322  lung]
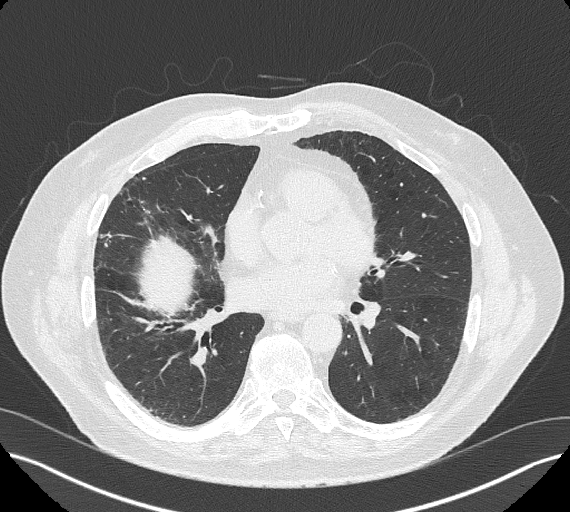
[im 173/322  lung]
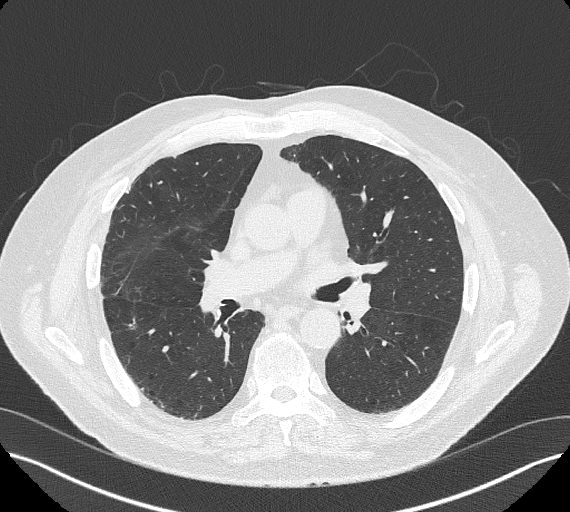
[im 198/322  lung]
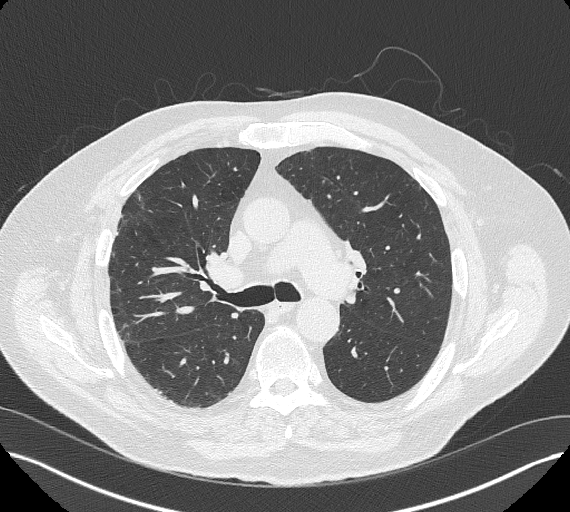
[im 247/322  lung]
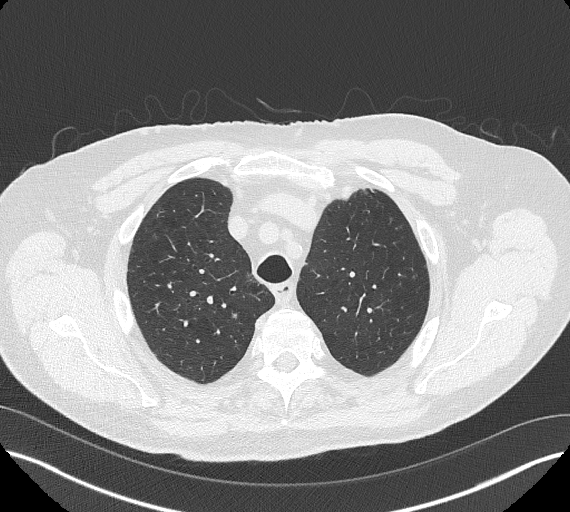
[im 272/322  mediastinal]
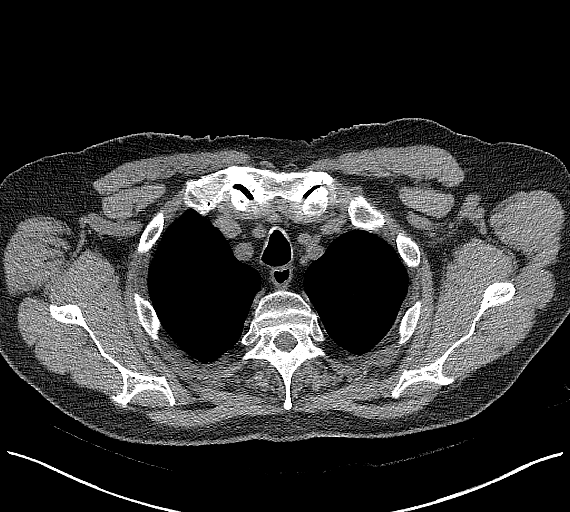
[im 272/322  lung]
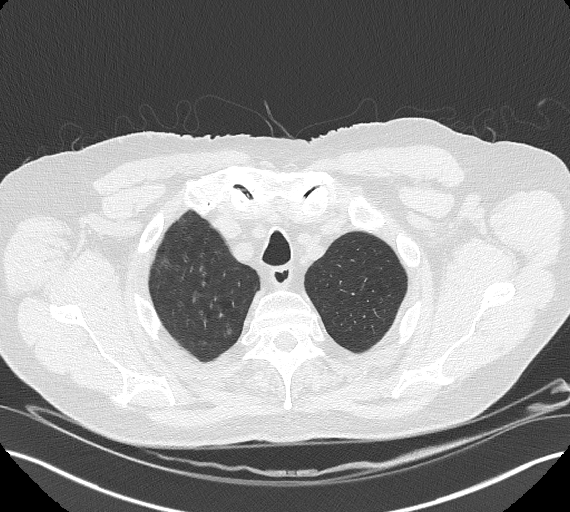
[im 297/322  lung]
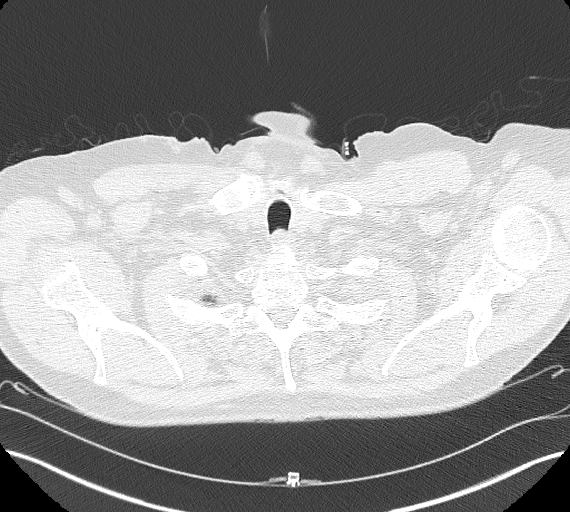

[13 of 36 positions shown; findings below may reference images not displayed]

FINDINGS: Cardiovascular: Normal heart size. No significant pericardial
effusion/thickening. Three-vessel coronary atherosclerosis.
Atherosclerotic nonaneurysmal thoracic aorta. Normal caliber
pulmonary arteries.

Mediastinum/Nodes: No discrete thyroid nodules. Unremarkable
esophagus. No pathologically enlarged axillary, mediastinal or hilar
lymph nodes, noting limited sensitivity for the detection of hilar
adenopathy on this noncontrast study.

Lungs/Pleura: No pneumothorax. No pleural effusion. Mild
centrilobular and paraseptal emphysema. No acute consolidative
airspace disease or lung masses. Tiny dense nodules in the posterior
lower lobes bilaterally are stable and considered benign. No new
significant pulmonary nodules. No significant lobular air trapping
or evidence of tracheobronchomalacia on the expiration sequence.
Mild patchy subpleural reticulation and ground-glass opacity
throughout the right greater than left lungs with associated mild
traction bronchiectasis and architectural distortion. There is a
basilar predominance to these findings. No frank honeycombing. No
appreciable interval progression.

Upper abdomen: Stable mild-to-moderate elevation of the right
hemidiaphragm. Cholecystectomy.

Musculoskeletal: No aggressive appearing focal osseous lesions.
Partially visualized surgical hardware from ACDF. Mild thoracic
spondylosis.
IMPRESSION: 1. Spectrum of findings suggestive of a mild basilar predominant
fibrotic interstitial lung disease without frank honeycombing,
without appreciable interval progression. Findings are indeterminate
for UIP per consensus guidelines: Diagnosis of Idiopathic Pulmonary
Fibrosis: An Official ATS/ERS/JRS/ALAT Clinical Practice Guideline.
Am J Respir Crit Care Med Vol 198, [place] 5, ppe00-e[DATE].
2. Three-vessel coronary atherosclerosis.
3. Chronic mild-to-moderate elevation of the right hemidiaphragm.
4. Aortic Atherosclerosis (79JTO-FG1.1) and Emphysema (79JTO-ZMP.4).

## 2022-09-17 DIAGNOSIS — H53431 Sector or arcuate defects, right eye: Secondary | ICD-10-CM | POA: Diagnosis not present

## 2022-09-17 DIAGNOSIS — H353112 Nonexudative age-related macular degeneration, right eye, intermediate dry stage: Secondary | ICD-10-CM | POA: Diagnosis not present

## 2022-09-17 DIAGNOSIS — H40043 Steroid responder, bilateral: Secondary | ICD-10-CM | POA: Diagnosis not present

## 2022-09-17 DIAGNOSIS — H401132 Primary open-angle glaucoma, bilateral, moderate stage: Secondary | ICD-10-CM | POA: Diagnosis not present

## 2022-10-14 DIAGNOSIS — H53431 Sector or arcuate defects, right eye: Secondary | ICD-10-CM | POA: Diagnosis not present

## 2022-10-14 DIAGNOSIS — H40043 Steroid responder, bilateral: Secondary | ICD-10-CM | POA: Diagnosis not present

## 2022-10-14 DIAGNOSIS — H353112 Nonexudative age-related macular degeneration, right eye, intermediate dry stage: Secondary | ICD-10-CM | POA: Diagnosis not present

## 2022-10-14 DIAGNOSIS — H401132 Primary open-angle glaucoma, bilateral, moderate stage: Secondary | ICD-10-CM | POA: Diagnosis not present

## 2022-10-16 ENCOUNTER — Encounter: Payer: Self-pay | Admitting: Podiatry

## 2022-10-16 ENCOUNTER — Ambulatory Visit (INDEPENDENT_AMBULATORY_CARE_PROVIDER_SITE_OTHER): Payer: Medicare Other | Admitting: Podiatry

## 2022-10-16 DIAGNOSIS — B351 Tinea unguium: Secondary | ICD-10-CM

## 2022-10-16 DIAGNOSIS — M79675 Pain in left toe(s): Secondary | ICD-10-CM

## 2022-10-16 DIAGNOSIS — E118 Type 2 diabetes mellitus with unspecified complications: Secondary | ICD-10-CM

## 2022-10-16 DIAGNOSIS — M79674 Pain in right toe(s): Secondary | ICD-10-CM | POA: Diagnosis not present

## 2022-10-16 NOTE — Progress Notes (Signed)
This patient presents  to my office for at risk foot care.  This patient requires this care by a professional since this patient will be at risk due to having diabetes and stage 3a CKD.  This patient is unable to cut nails himself since the patient cannot reach his nails.These nails are painful walking and wearing shoes.   This patient presents for at risk foot care today.  General Appearance  Alert, conversant and in no acute stress.  Vascular  Dorsalis pedis and posterior tibial  pulses are palpable  bilaterally.  Capillary return is within normal limits  bilaterally. Temperature is within normal limits  bilaterally.  Neurologic  Senn-Weinstein monofilament wire test within normal limits  bilaterally. Muscle power within normal limits bilaterally.  Nails Thick disfigured discolored nails with subungual debris  from hallux to fifth toes bilaterally. No evidence of bacterial infection or drainage bilaterally.  Orthopedic  No limitations of motion  feet .  No crepitus or effusions noted.  No bony pathology or digital deformities noted. Mild  HAV  B/L.  DJD 1st  MPJ  B/L.  Skin  normotropic skin with no porokeratosis noted bilaterally.  No signs of infections or ulcers noted.     Onychomycosis  Pain in right toes  Pain in left toes  Consent was obtained for treatment procedures.   Mechanical debridement of nails 1-5  bilaterally performed with a nail nipper.  Filed with dremel without incident.    Return office visit  10 weeks                   Told patient to return for periodic foot care and evaluation due to potential at risk complications.   Rannie Craney DPM   

## 2022-10-21 ENCOUNTER — Ambulatory Visit: Payer: Medicare Other | Admitting: Allergy and Immunology

## 2022-11-08 ENCOUNTER — Other Ambulatory Visit: Payer: Self-pay | Admitting: Internal Medicine

## 2022-11-08 DIAGNOSIS — F3342 Major depressive disorder, recurrent, in full remission: Secondary | ICD-10-CM

## 2022-11-09 ENCOUNTER — Other Ambulatory Visit: Payer: Self-pay | Admitting: Internal Medicine

## 2022-11-09 ENCOUNTER — Other Ambulatory Visit: Payer: Self-pay | Admitting: Emergency Medicine

## 2022-11-09 ENCOUNTER — Other Ambulatory Visit: Payer: Self-pay | Admitting: Allergy and Immunology

## 2022-11-09 DIAGNOSIS — E118 Type 2 diabetes mellitus with unspecified complications: Secondary | ICD-10-CM

## 2022-11-09 DIAGNOSIS — I10 Essential (primary) hypertension: Secondary | ICD-10-CM

## 2022-11-09 DIAGNOSIS — M1000 Idiopathic gout, unspecified site: Secondary | ICD-10-CM

## 2022-12-09 ENCOUNTER — Ambulatory Visit: Payer: Medicare Other | Admitting: Allergy and Immunology

## 2022-12-18 ENCOUNTER — Ambulatory Visit (INDEPENDENT_AMBULATORY_CARE_PROVIDER_SITE_OTHER): Payer: Medicare Other | Admitting: Podiatry

## 2022-12-18 ENCOUNTER — Encounter: Payer: Self-pay | Admitting: Podiatry

## 2022-12-18 DIAGNOSIS — M79675 Pain in left toe(s): Secondary | ICD-10-CM

## 2022-12-18 DIAGNOSIS — M79674 Pain in right toe(s): Secondary | ICD-10-CM

## 2022-12-18 DIAGNOSIS — B351 Tinea unguium: Secondary | ICD-10-CM

## 2022-12-18 DIAGNOSIS — E118 Type 2 diabetes mellitus with unspecified complications: Secondary | ICD-10-CM

## 2022-12-18 NOTE — Progress Notes (Signed)
This patient presents  to my office for at risk foot care.  This patient requires this care by a professional since this patient will be at risk due to having diabetes and stage 3a CKD.  This patient is unable to cut nails himself since the patient cannot reach his nails.These nails are painful walking and wearing shoes.   This patient presents for at risk foot care today.  General Appearance  Alert, conversant and in no acute stress.  Vascular  Dorsalis pedis and posterior tibial  pulses are palpable  bilaterally.  Capillary return is within normal limits  bilaterally. Temperature is within normal limits  bilaterally.  Neurologic  Senn-Weinstein monofilament wire test within normal limits  bilaterally. Muscle power within normal limits bilaterally.  Nails Thick disfigured discolored nails with subungual debris  from hallux to fifth toes bilaterally. No evidence of bacterial infection or drainage bilaterally.  Orthopedic  No limitations of motion  feet .  No crepitus or effusions noted.  No bony pathology or digital deformities noted. Mild  HAV  B/L.  DJD 1st  MPJ  B/L.  Skin  normotropic skin with no porokeratosis noted bilaterally.  No signs of infections or ulcers noted.     Onychomycosis  Pain in right toes  Pain in left toes  Consent was obtained for treatment procedures.   Mechanical debridement of nails 1-5  bilaterally performed with a nail nipper.  Filed with dremel without incident. Cauterize eft hallux.   Return office visit  9  weeks                   Told patient to return for periodic foot care and evaluation due to potential at risk complications.   Helane Gunther DPM

## 2023-01-22 DIAGNOSIS — H40043 Steroid responder, bilateral: Secondary | ICD-10-CM | POA: Diagnosis not present

## 2023-01-22 DIAGNOSIS — H401132 Primary open-angle glaucoma, bilateral, moderate stage: Secondary | ICD-10-CM | POA: Diagnosis not present

## 2023-02-03 ENCOUNTER — Encounter: Payer: Self-pay | Admitting: Allergy and Immunology

## 2023-02-03 ENCOUNTER — Ambulatory Visit (INDEPENDENT_AMBULATORY_CARE_PROVIDER_SITE_OTHER): Payer: Medicare Other | Admitting: Allergy and Immunology

## 2023-02-03 VITALS — BP 122/80 | HR 100 | Temp 97.3°F | Resp 18

## 2023-02-03 DIAGNOSIS — K219 Gastro-esophageal reflux disease without esophagitis: Secondary | ICD-10-CM

## 2023-02-03 DIAGNOSIS — J3089 Other allergic rhinitis: Secondary | ICD-10-CM

## 2023-02-03 DIAGNOSIS — D492 Neoplasm of unspecified behavior of bone, soft tissue, and skin: Secondary | ICD-10-CM

## 2023-02-03 DIAGNOSIS — J479 Bronchiectasis, uncomplicated: Secondary | ICD-10-CM | POA: Diagnosis not present

## 2023-02-03 DIAGNOSIS — E2740 Unspecified adrenocortical insufficiency: Secondary | ICD-10-CM

## 2023-02-03 DIAGNOSIS — B001 Herpesviral vesicular dermatitis: Secondary | ICD-10-CM | POA: Diagnosis not present

## 2023-02-03 DIAGNOSIS — J4489 Other specified chronic obstructive pulmonary disease: Secondary | ICD-10-CM | POA: Diagnosis not present

## 2023-02-03 DIAGNOSIS — J986 Disorders of diaphragm: Secondary | ICD-10-CM | POA: Diagnosis not present

## 2023-02-03 MED ORDER — PREDNISONE 1 MG PO TABS: ORAL_TABLET | ORAL | 0 refills | Status: DC

## 2023-02-03 MED ORDER — TRELEGY ELLIPTA 200-62.5-25 MCG/ACT IN AEPB: 1.0000 | INHALATION_SPRAY | Freq: Every morning | RESPIRATORY_TRACT | 1 refills | Status: DC

## 2023-02-03 MED ORDER — LEVOCETIRIZINE DIHYDROCHLORIDE 5 MG PO TABS: 5.0000 mg | ORAL_TABLET | Freq: Every day | ORAL | 1 refills | Status: DC | PRN

## 2023-02-03 MED ORDER — ALBUTEROL SULFATE HFA 108 (90 BASE) MCG/ACT IN AERS: 2.0000 | INHALATION_SPRAY | RESPIRATORY_TRACT | 1 refills | Status: DC | PRN

## 2023-02-03 MED ORDER — FAMOTIDINE 40 MG PO TABS: 40.0000 mg | ORAL_TABLET | Freq: Every day | ORAL | 1 refills | Status: DC

## 2023-02-03 MED ORDER — PANTOPRAZOLE SODIUM 40 MG PO TBEC: 40.0000 mg | DELAYED_RELEASE_TABLET | Freq: Every morning | ORAL | 1 refills | Status: DC

## 2023-02-03 MED ORDER — TRIAMCINOLONE ACETONIDE 55 MCG/ACT NA AERO: 1.0000 | INHALATION_SPRAY | Freq: Every day | NASAL | 1 refills | Status: DC

## 2023-02-03 NOTE — Patient Instructions (Addendum)
   1. Continue to Treat inflammation:   A. Trelegy 200 -1 inhalation 1 time per day (empty lungs)  2. Continue to Treat reflux / LPR:   A. Pantoprazole 40 mg in the a.m. + famotidine 40 mg in the p.m.  3.  If needed:   A. Albuterol HFA 2 puffs or Duoneb nebulization every 4-6 hours  B. nasal saline washes  C. OTC antihistamine  D. Mucinex DM 2 tablets twice a day  E. Nasacort one spray each nostril one time per day  F. Acyclovir 200 - 4 times a day during fever blister outbreak  G. Oxygen at 2 liters [place]    H. Percussion vest   4. Decrease prednisone 1 mg tablets daily:  A. Use 4 tablets (4 mg) for all of September  B. Use 3 tablets (3 mg) all of October C. Use 2 tablets (2 mg) all of November D. Use 1 tablet (1 mg) all of December  E. Discontinue prednisone January 2025  5. Stress steroid if trauma / illness  6. Have dermatology look at left shoulder growth  7. Return to clinic in Late December or earlier if problem.    8. Plan for fall flu vaccine

## 2023-02-03 NOTE — Progress Notes (Signed)
[place] - [place] - [place] - [place] - [place]   Follow-up Note  Referring Provider: Etta Grandchild, MD Primary Provider: Etta Grandchild, MD Date of Office Visit: 02/03/2023  Subjective:   [name] (DOB: 1940/12/25) is a 82 y.o. male who [facility] in re-evaluation of the following:  HPI: Brock returns to this clinic in reevaluation of COPD/asthma overlap, bronchiectasis, right hemiparesis of diaphragm, allergic rhinitis, history of chronic sinusitis, history of LPR, iatrogenic adrenal insufficiency, and history of intermittent herpes labialis.  I last saw him in this clinic 09 September 2022.  He has really done well since his last visit and has very little respiratory tract symptoms other than the fact that he is very short of breath if he exerts himself.  His cough is a chronic issue but is much better controlled on his current plan.  He has had very little issues with his upper airway.  He has had very little issues with reflux.  He has not had any fever blister outbreaks.  He has been able to successfully taper down his prednisone and is currently using 5 mg every day from his April dose of 10 mg daily.  Over the course of the past 2 or 3 months he has had a skin growth develop on his left shoulder.  Allergies as of 02/03/2023       Reactions   Ramipril Other (See Comments)   REACTION: chest congestion & cough. No angioedema        Medication List    acyclovir 200 MG capsule Commonly known as: Zovirax Take 1 (ONE) tablet by mouth 4 times a day during fever blister outbreak.   albuterol 108 (90 Base) MCG/ACT inhaler Commonly known as: Ventolin HFA Inhale 2 puffs into the lungs every 4 (four) hours as needed for wheezing or shortness of breath. INHALE 1-2 PUFFS INTO THE LUNGS EVERY 4 HOURS AS NEEDED FOR WHEEZING OR SHORTNESS OF BREATH   allopurinol 300 MG tablet Commonly known as: ZYLOPRIM TAKE 1 TABLET  BY MOUTH EVERY DAY   aspirin 81 MG chewable tablet Commonly known as: Aspirin Childrens Chew 1 tablet (81 mg total) by mouth daily.   atorvastatin 20 MG tablet Commonly known as: LIPITOR Take 1 tablet (20 mg total) by mouth daily.   betamethasone dipropionate 0.05 % cream Apply topically.   brimonidine 0.1 % Soln Commonly known as: ALPHAGAN P Place 1 drop into both eyes 2 (two) times daily. Both eyes   brimonidine 0.2 % ophthalmic solution Commonly known as: ALPHAGAN   dextromethorphan-guaiFENesin 30-600 MG 12hr tablet Commonly known as: MUCINEX DM Take 1 tablet by mouth 2 (two) times daily as needed for cough.   EPINEPHrine 0.3 mg/0.3 mL Soaj injection Commonly known as: EPI-PEN Inject 0.3 mg into the muscle as needed.   famotidine 40 MG tablet Commonly known as: PEPCID Take 1 tablet (40 mg total) by mouth at bedtime.   FLUoxetine 10 MG capsule Commonly known as: PROZAC TAKE 1 CAPSULE BY MOUTH EVERY DAY   ipratropium-albuterol 0.5-2.5 (3) MG/3ML Soln Commonly known as: DUONEB Take 3 mLs by nebulization every 6 (six) hours as needed.   levocetirizine 5 MG tablet Commonly known as: XYZAL Take 1 tablet (5 mg total) by mouth daily as needed for allergies (Can take an extra dose during flare).   mometasone 0.1 % ointment Commonly known as: ELOCON Apply topically once daily as needed   olmesartan 40 MG tablet  Commonly known as: BENICAR Take 1 tablet (40 mg total) by mouth daily.   pantoprazole 40 MG tablet Commonly known as: PROTONIX Take 1 tablet (40 mg total) by mouth in the morning. TAKE 1 TABLET BY MOUTH EVERY DAY IN THE MORNING   predniSONE 1 MG tablet Commonly known as: DELTASONE  7 MG DAILY    psyllium 58.6 % powder Commonly known as: METAMUCIL Take 1 packet by mouth daily.   Psyllium Husk 100 % Powd Take 1 packet by mouth daily.   Trelegy Ellipta 200-62.5-25 MCG/ACT Aepb Generic drug: Fluticasone-Umeclidin-Vilant Inhale 1 Inhalation into the  lungs daily.   triamcinolone 55 MCG/ACT Aero nasal inhaler Commonly known as: NASACORT Place 1 spray into the nose daily.   VITAMIN B 12 PO Take 1 tablet by mouth daily.    Past Medical History:  Diagnosis Date   Asthma    COPD (chronic obstructive pulmonary disease) (HCC)    reactive airway disease   Glaucoma    Gout    Hx of colonic polyps    Dr Kinnie Scales   Hyperlipidemia    NMR lipoprofile 2005: LDL 113(1416/825), HLD 37, TG 190.   Hypertension    Pneumonia     Past Surgical History:  Procedure Laterality Date   ANTERIOR CERVICAL DECOMP/DISCECTOMY FUSION N/A 03/08/2018   Procedure: ANTERIOR CERVICAL DECOMPRESSION/DISCECTOMY FUSION, INTERBODY PROSTHESIS,PLATE/SCREWS CERVICAL FOUR- CERVICAL FIVE, CERVICAL FIVE- CERVICAL SIX, CERVICAL SIX- CERVICAL SEVEN;  Surgeon: Tressie Stalker, MD;  Location: [facility] OR;  Service: Neurosurgery;  Laterality: N/A;  anterior   CATARACT EXTRACTION     bilat, implants   CHOLECYSTECTOMY  1995   LUMBAR LAMINECTOMY  1982   POLYPECTOMY  2003    neg 2006, Dr.Medoff   tear duct surgery  1998   for excess tearing, skin graft L foot @ age 6 1/2    Review of systems negative except as noted in HPI / PMHx or noted below:  Review of Systems  Constitutional: Negative.   HENT: Negative.    Eyes: Negative.   Respiratory: Negative.    Cardiovascular: Negative.   Gastrointestinal: Negative.   Genitourinary: Negative.   Musculoskeletal: Negative.   Skin: Negative.   Neurological: Negative.   Endo/Heme/Allergies: Negative.   Psychiatric/Behavioral: Negative.       Objective:   Vitals:   02/03/23 1029  BP: 122/80  Pulse: 100  Resp: 18  Temp: (!) 97.3 F (36.3 C)  SpO2: 94%          Physical Exam Constitutional:      Appearance: He is not diaphoretic.  HENT:     Head: Normocephalic.     Right Ear: Tympanic membrane, ear canal and external ear normal.     Left Ear: Tympanic membrane, ear canal and external ear normal.     Nose: Nose  normal. No mucosal edema or rhinorrhea.     Mouth/Throat:     Pharynx: Uvula midline. No oropharyngeal exudate.  Eyes:     Conjunctiva/sclera: Conjunctivae normal.  Neck:     Thyroid: No thyromegaly.     Trachea: Trachea normal. No tracheal tenderness or tracheal deviation.  Cardiovascular:     Rate and Rhythm: Normal rate and regular rhythm.     Heart sounds: Normal heart sounds, S1 normal and S2 normal. No murmur heard. Pulmonary:     Effort: No respiratory distress.     Breath sounds: Normal breath sounds. No stridor. No wheezing or rales.  Lymphadenopathy:     Head:  Right side of head: No tonsillar adenopathy.     Left side of head: No tonsillar adenopathy.     Cervical: No cervical adenopathy.  Skin:    Findings: Lesion (1 cm diameter raised cone like lesion left shoulder) present. No erythema or rash.     Nails: There is no clubbing.  Neurological:     Mental Status: He is alert.     Diagnostics:    Spirometry was performed and demonstrated an FEV1 of 1.74 at 62 % of predicted.  Assessment and Plan:   1. COPD with asthma   2. Bronchiectasis without complication (HCC)   3. Diaphragm dysfunction   4. Other allergic rhinitis   5. LPRD (laryngopharyngeal reflux disease)   6. Herpes labialis   7. Adrenal insufficiency (HCC)   8. Skin growth     1. Continue to Treat inflammation:   A. Trelegy 200 -1 inhalation 1 time per day (empty lungs)  2. Continue to Treat reflux / LPR:   A. Pantoprazole 40 mg in the a.m. + famotidine 40 mg in the p.m.  3.  If needed:   A. Albuterol HFA 2 puffs or Duoneb nebulization every 4-6 hours  B. nasal saline washes  C. OTC antihistamine  D. Mucinex DM 2 tablets twice a day  E. Nasacort one spray each nostril one time per day  F. Acyclovir 200 - 4 times a day during fever blister outbreak  G. Oxygen at 2 liters [place]   H. Percussion vest   4. Decrease prednisone 1 mg tablets daily:  A. Use 4 tablets (4 mg) for all of  September  B. Use 3 tablets (3 mg) all of October C. Use 2 tablets (2 mg) all of November D. Use 1 tablet (1 mg) all of December  E. Discontinue prednisone January 2025  5. Stress steroid if trauma / illness  6. Have dermatology look at left shoulder growth  7. Return to clinic in Late December or earlier if problem.    8. Plan for fall flu vaccine  Lucan is doing very well on his current plan which does include the use of systemic steroids which he has been using on a daily basis for months.  We will now begin to taper him off all systemic steroids using the very slow tapering program noted above.  He will remain on his inhaled anti-inflammatory agents and he has a collection of agents to be utilized should they be required should he develop problems with his medical issues as he moves forward.  I did have a talk with him today about the need for stress steroid should he develop a significant trauma or illness as he has been on systemic steroids for so long.  He has a very abnormal growth on his left shoulder and he needs to have dermatology take a look at that ASAP.  His dermatologist is Dr. Margo Aye and he will contact Dr. Margo Aye today.   Laurette Schimke, MD Allergy / Immunology [facility]

## 2023-02-04 ENCOUNTER — Encounter: Payer: Self-pay | Admitting: Allergy and Immunology

## 2023-02-05 ENCOUNTER — Other Ambulatory Visit: Payer: Self-pay | Admitting: Internal Medicine

## 2023-02-05 DIAGNOSIS — F3342 Major depressive disorder, recurrent, in full remission: Secondary | ICD-10-CM

## 2023-02-11 DIAGNOSIS — H40043 Steroid responder, bilateral: Secondary | ICD-10-CM | POA: Diagnosis not present

## 2023-02-11 DIAGNOSIS — H401132 Primary open-angle glaucoma, bilateral, moderate stage: Secondary | ICD-10-CM | POA: Diagnosis not present

## 2023-02-19 ENCOUNTER — Encounter: Payer: Self-pay | Admitting: Podiatry

## 2023-02-19 ENCOUNTER — Ambulatory Visit (INDEPENDENT_AMBULATORY_CARE_PROVIDER_SITE_OTHER): Payer: Medicare Other | Admitting: Podiatry

## 2023-02-19 ENCOUNTER — Ambulatory Visit: Payer: Medicare Other | Admitting: Podiatry

## 2023-02-19 DIAGNOSIS — M79675 Pain in left toe(s): Secondary | ICD-10-CM | POA: Diagnosis not present

## 2023-02-19 DIAGNOSIS — H31011 Macula scars of posterior pole (postinflammatory) (post-traumatic), right eye: Secondary | ICD-10-CM | POA: Diagnosis not present

## 2023-02-19 DIAGNOSIS — Z961 Presence of intraocular lens: Secondary | ICD-10-CM | POA: Diagnosis not present

## 2023-02-19 DIAGNOSIS — B351 Tinea unguium: Secondary | ICD-10-CM

## 2023-02-19 DIAGNOSIS — M79674 Pain in right toe(s): Secondary | ICD-10-CM

## 2023-02-19 DIAGNOSIS — E118 Type 2 diabetes mellitus with unspecified complications: Secondary | ICD-10-CM

## 2023-02-19 DIAGNOSIS — H401132 Primary open-angle glaucoma, bilateral, moderate stage: Secondary | ICD-10-CM | POA: Diagnosis not present

## 2023-02-19 NOTE — Progress Notes (Signed)
This patient returns to my office for at risk foot care.  This patient requires this care by a professional since this patient will be at risk due to having diabetes.  This patient is unable to cut nails himself since the patient cannot reach his nails.These nails are painful walking and wearing shoes.  This patient presents for at risk foot care today.  General Appearance  Alert, conversant and in no acute stress.  Vascular  Dorsalis pedis and posterior tibial  pulses are palpable  bilaterally.  Capillary return is within normal limits  bilaterally. Temperature is within normal limits  bilaterally.  Neurologic  Senn-Weinstein monofilament wire test within normal limits  bilaterally. Muscle power within normal limits bilaterally.  Nails Thick disfigured discolored nails with subungual debris  from hallux to fifth toes bilaterally. No evidence of bacterial infection or drainage bilaterally.  Orthopedic  No limitations of motion  feet .  No crepitus or effusions noted.  No bony pathology or digital deformities noted.  Skin  normotropic skin with no porokeratosis noted bilaterally.  No signs of infections or ulcers noted.     Onychomycosis  Pain in right toes  Pain in left toes  Consent was obtained for treatment procedures.   Mechanical debridement of nails 1-5  bilaterally performed with a nail nipper.  Filed with dremel without incident.    Return office visit   10 weeks                   Told patient to return for periodic foot care and evaluation due to potential at risk complications.   Helane Gunther DPM

## 2023-02-20 DIAGNOSIS — C44629 Squamous cell carcinoma of skin of left upper limb, including shoulder: Secondary | ICD-10-CM | POA: Diagnosis not present

## 2023-02-26 DIAGNOSIS — H401132 Primary open-angle glaucoma, bilateral, moderate stage: Secondary | ICD-10-CM | POA: Diagnosis not present

## 2023-02-26 DIAGNOSIS — H31011 Macula scars of posterior pole (postinflammatory) (post-traumatic), right eye: Secondary | ICD-10-CM | POA: Diagnosis not present

## 2023-02-26 DIAGNOSIS — Z961 Presence of intraocular lens: Secondary | ICD-10-CM | POA: Diagnosis not present

## 2023-03-04 DIAGNOSIS — Z23 Encounter for immunization: Secondary | ICD-10-CM | POA: Diagnosis not present

## 2023-03-09 DIAGNOSIS — H353112 Nonexudative age-related macular degeneration, right eye, intermediate dry stage: Secondary | ICD-10-CM | POA: Diagnosis not present

## 2023-03-09 DIAGNOSIS — H401132 Primary open-angle glaucoma, bilateral, moderate stage: Secondary | ICD-10-CM | POA: Diagnosis not present

## 2023-03-09 DIAGNOSIS — H40043 Steroid responder, bilateral: Secondary | ICD-10-CM | POA: Diagnosis not present

## 2023-03-09 DIAGNOSIS — H53431 Sector or arcuate defects, right eye: Secondary | ICD-10-CM | POA: Diagnosis not present

## 2023-03-13 ENCOUNTER — Other Ambulatory Visit: Payer: Self-pay | Admitting: Internal Medicine

## 2023-03-13 DIAGNOSIS — M1000 Idiopathic gout, unspecified site: Secondary | ICD-10-CM

## 2023-03-23 DIAGNOSIS — H401132 Primary open-angle glaucoma, bilateral, moderate stage: Secondary | ICD-10-CM | POA: Diagnosis not present

## 2023-03-23 DIAGNOSIS — Z961 Presence of intraocular lens: Secondary | ICD-10-CM | POA: Diagnosis not present

## 2023-03-23 DIAGNOSIS — H31011 Macula scars of posterior pole (postinflammatory) (post-traumatic), right eye: Secondary | ICD-10-CM | POA: Diagnosis not present

## 2023-03-27 DIAGNOSIS — Z85828 Personal history of other malignant neoplasm of skin: Secondary | ICD-10-CM | POA: Diagnosis not present

## 2023-03-27 DIAGNOSIS — L570 Actinic keratosis: Secondary | ICD-10-CM | POA: Diagnosis not present

## 2023-03-27 DIAGNOSIS — X32XXXD Exposure to sunlight, subsequent encounter: Secondary | ICD-10-CM | POA: Diagnosis not present

## 2023-03-27 DIAGNOSIS — Z08 Encounter for follow-up examination after completed treatment for malignant neoplasm: Secondary | ICD-10-CM | POA: Diagnosis not present

## 2023-04-05 ENCOUNTER — Other Ambulatory Visit: Payer: Self-pay | Admitting: Internal Medicine

## 2023-04-05 DIAGNOSIS — E785 Hyperlipidemia, unspecified: Secondary | ICD-10-CM

## 2023-04-09 DIAGNOSIS — H401132 Primary open-angle glaucoma, bilateral, moderate stage: Secondary | ICD-10-CM | POA: Diagnosis not present

## 2023-04-09 DIAGNOSIS — H31011 Macula scars of posterior pole (postinflammatory) (post-traumatic), right eye: Secondary | ICD-10-CM | POA: Diagnosis not present

## 2023-04-09 DIAGNOSIS — Z961 Presence of intraocular lens: Secondary | ICD-10-CM | POA: Diagnosis not present

## 2023-04-16 ENCOUNTER — Other Ambulatory Visit: Payer: Self-pay | Admitting: Internal Medicine

## 2023-04-16 DIAGNOSIS — M1000 Idiopathic gout, unspecified site: Secondary | ICD-10-CM

## 2023-05-06 ENCOUNTER — Telehealth: Payer: Self-pay

## 2023-05-06 NOTE — Telephone Encounter (Signed)
Patients wife called requesting an email to send Dr. Lucie Leather information on Edwin [name]'s health. I gave her the allergy and asthma email. Please be on the lookout for patients email.

## 2023-05-07 ENCOUNTER — Encounter: Payer: Self-pay | Admitting: Podiatry

## 2023-05-07 ENCOUNTER — Ambulatory Visit (INDEPENDENT_AMBULATORY_CARE_PROVIDER_SITE_OTHER): Payer: Medicare Other | Admitting: Podiatry

## 2023-05-07 DIAGNOSIS — E118 Type 2 diabetes mellitus with unspecified complications: Secondary | ICD-10-CM

## 2023-05-07 DIAGNOSIS — B351 Tinea unguium: Secondary | ICD-10-CM

## 2023-05-07 DIAGNOSIS — M79675 Pain in left toe(s): Secondary | ICD-10-CM

## 2023-05-07 DIAGNOSIS — M79674 Pain in right toe(s): Secondary | ICD-10-CM | POA: Diagnosis not present

## 2023-05-07 NOTE — Progress Notes (Signed)
This patient returns to my office for at risk foot care.  This patient requires this care by a professional since this patient will be at risk due to having diabetes.  This patient is unable to cut nails himself since the patient cannot reach his nails.These nails are painful walking and wearing shoes.  This patient presents for at risk foot care today.  General Appearance  Alert, conversant and in no acute stress.  Vascular  Dorsalis pedis and posterior tibial  pulses are palpable  bilaterally.  Capillary return is within normal limits  bilaterally. Temperature is within normal limits  bilaterally.  Neurologic  Senn-Weinstein monofilament wire test within normal limits  bilaterally. Muscle power within normal limits bilaterally.  Nails Thick disfigured discolored nails with subungual debris  from hallux to fifth toes bilaterally. No evidence of bacterial infection or drainage bilaterally.  Orthopedic  No limitations of motion  feet .  No crepitus or effusions noted.  No bony pathology or digital deformities noted.  Skin  normotropic skin with no porokeratosis noted bilaterally.  No signs of infections or ulcers noted.     Onychomycosis  Pain in right toes  Pain in left toes  Consent was obtained for treatment procedures.   Mechanical debridement of nails 1-5  bilaterally performed with a nail nipper.  Filed with dremel without incident.    Return office visit  10 weeks                    Told patient to return for periodic foot care and evaluation due to potential at risk complications.   Reesa Gotschall DPM   

## 2023-05-07 NOTE — Telephone Encounter (Signed)
I have checked our office email - no information regarding patient has been sent yet.

## 2023-05-11 NOTE — Telephone Encounter (Signed)
I have checked our office email today, 05/11/23, no information regarding patient has been sent yet.

## 2023-05-13 NOTE — Telephone Encounter (Signed)
I called and spoke with the patients wife, she stated that it would be easier for them to come to [place]. I have him scheduled to come in on 05/19/23 at 2:30pm. Patient's wife verbalized understanding.

## 2023-05-13 NOTE — Telephone Encounter (Signed)
I called and spoke with the patient's wife and advised that we had not received an e-mail. She stated that she was concerned because of his symptoms and wanted to send an e-mail, she stated that hey was being forgetful last week and having nausea, lethargic. She states that he is doing much better today and not having any more of those symptoms. She asked if he could come in and be seen on 05/19/23 instead of 05/26/23.I advised that I would reach out and see if it was ok to move his appointment since he would have to be double booked and I would call her back on her home phone to let her know. Please advise about rescheduling his appointment.

## 2023-05-17 ENCOUNTER — Other Ambulatory Visit: Payer: Self-pay | Admitting: Internal Medicine

## 2023-05-17 DIAGNOSIS — F3342 Major depressive disorder, recurrent, in full remission: Secondary | ICD-10-CM

## 2023-05-18 DIAGNOSIS — Z961 Presence of intraocular lens: Secondary | ICD-10-CM | POA: Diagnosis not present

## 2023-05-18 DIAGNOSIS — H31011 Macula scars of posterior pole (postinflammatory) (post-traumatic), right eye: Secondary | ICD-10-CM | POA: Diagnosis not present

## 2023-05-18 DIAGNOSIS — H401132 Primary open-angle glaucoma, bilateral, moderate stage: Secondary | ICD-10-CM | POA: Diagnosis not present

## 2023-05-18 DIAGNOSIS — H04123 Dry eye syndrome of bilateral lacrimal glands: Secondary | ICD-10-CM | POA: Diagnosis not present

## 2023-05-19 ENCOUNTER — Ambulatory Visit (INDEPENDENT_AMBULATORY_CARE_PROVIDER_SITE_OTHER): Payer: Medicare Other | Admitting: Allergy and Immunology

## 2023-05-19 VITALS — BP 138/82 | HR 75 | Temp 98.3°F | Resp 14 | Ht 70.0 in | Wt 189.7 lb

## 2023-05-19 DIAGNOSIS — J4489 Other specified chronic obstructive pulmonary disease: Secondary | ICD-10-CM

## 2023-05-19 DIAGNOSIS — J3089 Other allergic rhinitis: Secondary | ICD-10-CM | POA: Diagnosis not present

## 2023-05-19 DIAGNOSIS — R197 Diarrhea, unspecified: Secondary | ICD-10-CM | POA: Diagnosis not present

## 2023-05-19 DIAGNOSIS — J986 Disorders of diaphragm: Secondary | ICD-10-CM | POA: Diagnosis not present

## 2023-05-19 DIAGNOSIS — J479 Bronchiectasis, uncomplicated: Secondary | ICD-10-CM

## 2023-05-19 DIAGNOSIS — E2740 Unspecified adrenocortical insufficiency: Secondary | ICD-10-CM

## 2023-05-19 DIAGNOSIS — B001 Herpesviral vesicular dermatitis: Secondary | ICD-10-CM

## 2023-05-19 DIAGNOSIS — K219 Gastro-esophageal reflux disease without esophagitis: Secondary | ICD-10-CM | POA: Diagnosis not present

## 2023-05-19 MED ORDER — PANTOPRAZOLE SODIUM 40 MG PO TBEC: 40.0000 mg | DELAYED_RELEASE_TABLET | Freq: Every morning | ORAL | 1 refills | Status: AC

## 2023-05-19 MED ORDER — ALBUTEROL SULFATE HFA 108 (90 BASE) MCG/ACT IN AERS: 2.0000 | INHALATION_SPRAY | RESPIRATORY_TRACT | 1 refills | Status: AC | PRN

## 2023-05-19 MED ORDER — IPRATROPIUM-ALBUTEROL 0.5-2.5 (3) MG/3ML IN SOLN: 3.0000 mL | Freq: Four times a day (QID) | RESPIRATORY_TRACT | 2 refills | Status: AC | PRN

## 2023-05-19 MED ORDER — TRELEGY ELLIPTA 200-62.5-25 MCG/ACT IN AEPB: 1.0000 | INHALATION_SPRAY | Freq: Every morning | RESPIRATORY_TRACT | 1 refills | Status: DC

## 2023-05-19 MED ORDER — FAMOTIDINE 40 MG PO TABS: 40.0000 mg | ORAL_TABLET | Freq: Every day | ORAL | 1 refills | Status: DC

## 2023-05-19 MED ORDER — LEVOCETIRIZINE DIHYDROCHLORIDE 5 MG PO TABS: 5.0000 mg | ORAL_TABLET | Freq: Every day | ORAL | 1 refills | Status: DC | PRN

## 2023-05-19 MED ORDER — TRIAMCINOLONE ACETONIDE 55 MCG/ACT NA AERO: 1.0000 | INHALATION_SPRAY | Freq: Every day | NASAL | 1 refills | Status: AC

## 2023-05-19 NOTE — Patient Instructions (Addendum)
   1. Continue to Treat inflammation:   A. Trelegy 200 -1 inhalation 1 time per day (empty lungs)  2. Continue to Treat reflux / LPR:   A. Pantoprazole 40 mg in the a.m. + famotidine 40 mg in the p.m.  3.  If needed:   A. Albuterol HFA 2 puffs or Duoneb nebulization every 4-6 hours  B. nasal saline washes  C. OTC antihistamine  D. Mucinex DM 2 tablets twice a day  E. Nasacort one spray each nostril one time per day  F. Acyclovir 200 - 4 times a day during fever blister outbreak  G. Oxygen at 2 liters [place]   H. Percussion vest   4. Treat possible steroid deficiency:   A. Prednisone 30 mg delivered in clinic today  B. Prednisone 10 mg - 1 tablet 1 time per day  5. Evaluation cause of diarrhea:   A. Stool for C diff. Toxin and culture, stool pathogens, wbc, O&P  B. Blood - CBC w/d, CMP, TSH, FT4  6. Further evaluation and treatment???

## 2023-05-19 NOTE — Progress Notes (Unsigned)
[place] - [place] - [place] - [place] - [place]   Follow-up Note  Referring Provider: Etta Grandchild, MD Primary Provider: Etta Grandchild, MD Date of Office Visit: 05/19/2023  Subjective:   Edwin [name] (DOB: November 18, 1940) is a 82 y.o. male who [facility]/2023 in re-evaluation of the following:  HPI: Dajion returns to this clinic in reevaluation of COPD/asthma overlap, bronchiectasis, right hemiparesis diaphragm, allergic rhinitis, history of chronic sinusitis, history of LPR, iatrogenic adrenal insufficiency, and history of intermittent herpes labialis.  I last saw him in this clinic 03 February 2023.  He just feels awful.  He has no energy whatsoever.  He has had chronic diarrhea every day for the past 3 months.  He is taper down his prednisone and is currently on 1 mg/day.  He has not been having any significant respiratory tract symptoms and in fact is not coughing at this point in time.  His reflux is under excellent control.  When he was last seen in this clinic he had a growth on his left shoulder and dermatology did remove the growth and it turned out to be a squamous cell carcinoma.  Allergies as of 05/19/2023       Reactions   Brimonidine Other (See Comments)   Conjunctivitis, elevated IOP   Ramipril Other (See Comments)   REACTION: chest congestion & cough. No angioedema        Medication List    acyclovir 200 MG capsule Commonly known as: Zovirax Take 1 (ONE) tablet by mouth 4 times a day during fever blister outbreak.   albuterol 108 (90 Base) MCG/ACT inhaler Commonly known as: Ventolin HFA Inhale 2 puffs into the lungs every 4 (four) hours as needed for wheezing or shortness of breath. INHALE 1-2 PUFFS INTO THE LUNGS EVERY 4 HOURS AS NEEDED FOR WHEEZING OR SHORTNESS OF BREATH   allopurinol 300 MG tablet Commonly known as: ZYLOPRIM TAKE 1 TABLET BY MOUTH EVERY DAY   aspirin 81 MG chewable  tablet Commonly known as: Aspirin Childrens Chew 1 tablet (81 mg total) by mouth daily.   atorvastatin 20 MG tablet Commonly known as: LIPITOR Take 1 tablet (20 mg total) by mouth daily.   betamethasone dipropionate 0.05 % cream Apply topically.   brimonidine 0.2 % ophthalmic solution Commonly known as: ALPHAGAN   dextromethorphan-guaiFENesin 30-600 MG 12hr tablet Commonly known as: MUCINEX DM Take 1 tablet by mouth 2 (two) times daily as needed for cough.   dorzolamide 2 % ophthalmic solution Commonly known as: TRUSOPT Place 1 drop into both eyes 3 (three) times daily.   EPINEPHrine 0.3 mg/0.3 mL Soaj injection Commonly known as: EPI-PEN Inject 0.3 mg into the muscle as needed.   famotidine 40 MG tablet Commonly known as: PEPCID Take 1 tablet (40 mg total) by mouth at bedtime.   ipratropium-albuterol 0.5-2.5 (3) MG/3ML Soln Commonly known as: DUONEB Take 3 mLs by nebulization every 6 (six) hours as needed.   levocetirizine 5 MG tablet Commonly known as: XYZAL Take 1 tablet (5 mg total) by mouth daily as needed for allergies (Can take an extra dose during flare).   mometasone 0.1 % ointment Commonly known as: ELOCON Apply topically once daily as needed   olmesartan 40 MG tablet Commonly known as: BENICAR Take 1 tablet (40 mg total) by mouth daily.   pantoprazole 40 MG tablet Commonly known as: PROTONIX Take 1 tablet (40 mg total) by mouth in the morning.  predniSONE 1 MG tablet Commonly known as: DELTASONE Use 4 tablets (4 mg) for all of September , then use 3 tablets (3 mg) all of October, then use 2 tablets (2 mg) all of November, then use 1 tablet (1 mg) all of December, then discontinue prednisone January 2025   Rocklatan 0.02-0.005 % Soln Generic drug: Netarsudil-Latanoprost Place 1 drop into both eyes at bedtime.   Trelegy Ellipta 200-62.5-25 MCG/ACT Aepb Generic drug: Fluticasone-Umeclidin-Vilant Inhale 1 Inhalation into the lungs every morning.    VITAMIN B 12 PO Take 1 tablet by mouth daily.    Past Medical History:  Diagnosis Date   Asthma    COPD (chronic obstructive pulmonary disease) (HCC)    reactive airway disease   Glaucoma    Gout    Hx of colonic polyps    Dr Kinnie Scales   Hyperlipidemia    NMR lipoprofile 2005: LDL 113(1416/825), HLD 37, TG 190.   Hypertension    Pneumonia     Past Surgical History:  Procedure Laterality Date   ANTERIOR CERVICAL DECOMP/DISCECTOMY FUSION N/A 03/08/2018   Procedure: ANTERIOR CERVICAL DECOMPRESSION/DISCECTOMY FUSION, INTERBODY PROSTHESIS,PLATE/SCREWS CERVICAL FOUR- CERVICAL FIVE, CERVICAL FIVE- CERVICAL SIX, CERVICAL SIX- CERVICAL SEVEN;  Surgeon: Tressie Stalker, MD;  Location: [facility] OR;  Service: Neurosurgery;  Laterality: N/A;  anterior   CATARACT EXTRACTION     bilat, implants   CHOLECYSTECTOMY  1995   LUMBAR LAMINECTOMY  1982   POLYPECTOMY  2003    neg 2006, Dr.Medoff   tear duct surgery  1998   for excess tearing, skin graft L foot @ age 26 1/2    Review of systems negative except as noted in HPI / PMHx or noted below:  Review of Systems  Constitutional: Negative.   HENT: Negative.    Eyes: Negative.   Respiratory: Negative.    Cardiovascular: Negative.   Gastrointestinal: Negative.   Genitourinary: Negative.   Musculoskeletal: Negative.   Skin: Negative.   Neurological: Negative.   Endo/Heme/Allergies: Negative.   Psychiatric/Behavioral: Negative.       Objective:   Vitals:   05/19/23 1438  BP: 138/82  Pulse: 75  Resp: 14  Temp: 98.3 F (36.8 C)  SpO2: 95%   Height: 5\' 10"  (177.8 cm)  Weight: 189 lb 11.2 oz (86 kg)   Physical Exam Constitutional:      Appearance: He is not diaphoretic.  HENT:     Head: Normocephalic.     Right Ear: Tympanic membrane, ear canal and external ear normal.     Left Ear: Tympanic membrane, ear canal and external ear normal.     Nose: Nose normal. No mucosal edema or rhinorrhea.     Mouth/Throat:     Pharynx: Uvula  midline. No oropharyngeal exudate.  Eyes:     Conjunctiva/sclera: Conjunctivae normal.  Neck:     Thyroid: No thyromegaly.     Trachea: Trachea normal. No tracheal tenderness or tracheal deviation.  Cardiovascular:     Rate and Rhythm: Normal rate and regular rhythm.     Heart sounds: Normal heart sounds, S1 normal and S2 normal. No murmur heard. Pulmonary:     Effort: No respiratory distress.     Breath sounds: Normal breath sounds. No stridor. No wheezing or rales.  Lymphadenopathy:     Head:     Right side of head: No tonsillar adenopathy.     Left side of head: No tonsillar adenopathy.     Cervical: No cervical adenopathy.  Skin:    Findings:  No erythema or rash.     Nails: There is no clubbing.  Neurological:     Mental Status: He is alert.     Diagnostics: none  Assessment and Plan:   1. Diarrhea of presumed infectious origin   2. Adrenal insufficiency (HCC)   3. COPD with asthma (HCC)   4. Bronchiectasis without complication (HCC)   5. Diaphragm dysfunction   6. Other allergic rhinitis   7. LPRD (laryngopharyngeal reflux disease)   8. Herpes labialis    1. Continue to Treat inflammation:   A. Trelegy 200 -1 inhalation 1 time per day (empty lungs)  2. Continue to Treat reflux / LPR:   A. Pantoprazole 40 mg in the a.m. + famotidine 40 mg in the p.m.  3.  If needed:   A. Albuterol HFA 2 puffs or Duoneb nebulization every 4-6 hours  B. nasal saline washes  C. OTC antihistamine  D. Mucinex DM 2 tablets twice a day  E. Nasacort one spray each nostril one time per day  F. Acyclovir 200 - 4 times a day during fever blister outbreak  G. Oxygen at 2 liters [place]   H. Percussion vest   4. Treat possible steroid deficiency:   A. Prednisone 30 mg delivered in clinic today  B. Prednisone 10 mg - 1 tablet 1 time per day  5. Evaluation cause of diarrhea:   A. Stool for C diff. Toxin and culture, stool pathogens, wbc, O&P  B. Blood - CBC w/d, CMP, TSH, FT4  6.  Further evaluation and treatment???   I am suspicious that Lyam might be developing adrenal insufficiency as we slowly taper down his chronic systemic steroid use and I am going to give him 30 mg of prednisone today and then 10 mg a day until we can sort things through.  It is quite possible that his diarrhea is also a manifestation of adrenal insufficiency but we need to consider an infectious form of diarrhea as well and he will obtain the stool tests and we will also do a general screen of his major organ systems with blood test as noted above.  He will continue to treat inflammation of his airway with therapy as stated above and continue to treat reflux with therapy as stated above.  I will contact him with the results of his blood test once they are available for review.   Laurette Schimke, MD Allergy / Immunology [facility]

## 2023-05-20 ENCOUNTER — Other Ambulatory Visit: Payer: Self-pay | Admitting: Allergy and Immunology

## 2023-05-20 ENCOUNTER — Encounter: Payer: Self-pay | Admitting: Allergy and Immunology

## 2023-05-20 LAB — COMPREHENSIVE METABOLIC PANEL
ALT: 19 [IU]/L (ref 0–44)
AST: 17 [IU]/L (ref 0–40)
Albumin: 4.4 g/dL (ref 3.7–4.7)
Alkaline Phosphatase: 74 [IU]/L (ref 44–121)
BUN/Creatinine Ratio: 14 (ref 10–24)
BUN: 14 mg/dL (ref 8–27)
Bilirubin Total: 0.4 mg/dL (ref 0.0–1.2)
CO2: 21 mmol/L (ref 20–29)
Calcium: 9.5 mg/dL (ref 8.6–10.2)
Chloride: 103 mmol/L (ref 96–106)
Creatinine, Ser: 1.01 mg/dL (ref 0.76–1.27)
Globulin, Total: 2.3 g/dL (ref 1.5–4.5)
Glucose: 88 mg/dL (ref 70–99)
Potassium: 4.2 mmol/L (ref 3.5–5.2)
Sodium: 142 mmol/L (ref 134–144)
Total Protein: 6.7 g/dL (ref 6.0–8.5)
eGFR: 75 mL/min/{1.73_m2} (ref >59)

## 2023-05-20 LAB — CBC WITH DIFFERENTIAL/PLATELET
Basophils Absolute: 0 10*3/uL (ref 0.0–0.2)
Basos: 1 %
EOS (ABSOLUTE): 0.1 10*3/uL (ref 0.0–0.4)
Eos: 1 %
Hematocrit: 48.9 % (ref 37.5–51.0)
Hemoglobin: 16.2 g/dL (ref 13.0–17.7)
Immature Grans (Abs): 0 10*3/uL (ref 0.0–0.1)
Immature Granulocytes: 0 %
Lymphocytes Absolute: 2.3 10*3/uL (ref 0.7–3.1)
Lymphs: 27 %
MCH: 32.5 pg (ref 26.6–33.0)
MCHC: 33.1 g/dL (ref 31.5–35.7)
MCV: 98 fL — ABNORMAL HIGH (ref 79–97)
Monocytes Absolute: 0.9 10*3/uL (ref 0.1–0.9)
Monocytes: 10 %
Neutrophils Absolute: 5.3 10*3/uL (ref 1.4–7.0)
Neutrophils: 61 %
Platelets: 251 10*3/uL (ref 150–450)
RBC: 4.99 x10E6/uL (ref 4.14–5.80)
RDW: 13.9 % (ref 11.6–15.4)
WBC: 8.5 10*3/uL (ref 3.4–10.8)

## 2023-05-20 LAB — TSH: TSH: 1.99 u[IU]/mL (ref 0.450–4.500)

## 2023-05-20 LAB — T4, FREE: Free T4: 0.9 ng/dL (ref 0.82–1.77)

## 2023-05-26 ENCOUNTER — Ambulatory Visit: Payer: Medicare Other | Admitting: Allergy and Immunology

## 2023-05-29 ENCOUNTER — Other Ambulatory Visit: Payer: Self-pay | Admitting: Internal Medicine

## 2023-05-29 DIAGNOSIS — H31011 Macula scars of posterior pole (postinflammatory) (post-traumatic), right eye: Secondary | ICD-10-CM | POA: Diagnosis not present

## 2023-05-29 DIAGNOSIS — H401132 Primary open-angle glaucoma, bilateral, moderate stage: Secondary | ICD-10-CM | POA: Diagnosis not present

## 2023-05-29 DIAGNOSIS — Z961 Presence of intraocular lens: Secondary | ICD-10-CM | POA: Diagnosis not present

## 2023-05-29 DIAGNOSIS — E785 Hyperlipidemia, unspecified: Secondary | ICD-10-CM

## 2023-05-29 DIAGNOSIS — H04123 Dry eye syndrome of bilateral lacrimal glands: Secondary | ICD-10-CM | POA: Diagnosis not present

## 2023-06-16 ENCOUNTER — Telehealth: Payer: Self-pay | Admitting: *Deleted

## 2023-06-16 NOTE — Telephone Encounter (Signed)
 Tried calling no answer left a message for a return call. Mychart message sent as well.

## 2023-06-16 NOTE — Telephone Encounter (Signed)
-----   Message from ERIC J KOZLOW sent at 06/16/2023  9:54 AM EST ----- Please contact Jacy and ask him how he is doing while using prednisone 10 mg daily every day.  Did this help him regarding how he feels and did this help his diarrhea?

## 2023-07-20 ENCOUNTER — Ambulatory Visit (INDEPENDENT_AMBULATORY_CARE_PROVIDER_SITE_OTHER): Payer: Medicare Other | Admitting: Podiatry

## 2023-07-20 ENCOUNTER — Encounter: Payer: Self-pay | Admitting: Podiatry

## 2023-07-20 DIAGNOSIS — B351 Tinea unguium: Secondary | ICD-10-CM | POA: Diagnosis not present

## 2023-07-20 DIAGNOSIS — M79675 Pain in left toe(s): Secondary | ICD-10-CM | POA: Diagnosis not present

## 2023-07-20 DIAGNOSIS — E118 Type 2 diabetes mellitus with unspecified complications: Secondary | ICD-10-CM | POA: Diagnosis not present

## 2023-07-20 DIAGNOSIS — M79674 Pain in right toe(s): Secondary | ICD-10-CM | POA: Diagnosis not present

## 2023-07-20 NOTE — Progress Notes (Signed)
This patient returns to my office for at risk foot care.  This patient requires this care by a professional since this patient will be at risk due to having diabetes.  This patient is unable to cut nails himself since the patient cannot reach his nails.These nails are painful walking and wearing shoes.  This patient presents for at risk foot care today.  General Appearance  Alert, conversant and in no acute stress.  Vascular  Dorsalis pedis and posterior tibial  pulses are palpable  bilaterally.  Capillary return is within normal limits  bilaterally. Temperature is within normal limits  bilaterally.  Neurologic  Senn-Weinstein monofilament wire test within normal limits  bilaterally. Muscle power within normal limits bilaterally.  Nails Thick disfigured discolored nails with subungual debris  from hallux to fifth toes bilaterally. No evidence of bacterial infection or drainage bilaterally.  Orthopedic  No limitations of motion  feet .  No crepitus or effusions noted.  No bony pathology or digital deformities noted.  Skin  normotropic skin with no porokeratosis noted bilaterally.  No signs of infections or ulcers noted.     Onychomycosis  Pain in right toes  Pain in left toes  Consent was obtained for treatment procedures.   Mechanical debridement of nails 1-5  bilaterally performed with a nail nipper.  Filed with dremel without incident.    Return office visit  12 weeks                    Told patient to return for periodic foot care and evaluation due to potential at risk complications.   Joyice Magda DPM  

## 2023-08-12 ENCOUNTER — Other Ambulatory Visit: Payer: Self-pay | Admitting: Internal Medicine

## 2023-08-12 DIAGNOSIS — F3342 Major depressive disorder, recurrent, in full remission: Secondary | ICD-10-CM

## 2023-08-27 DIAGNOSIS — Z961 Presence of intraocular lens: Secondary | ICD-10-CM | POA: Diagnosis not present

## 2023-08-27 DIAGNOSIS — H31011 Macula scars of posterior pole (postinflammatory) (post-traumatic), right eye: Secondary | ICD-10-CM | POA: Diagnosis not present

## 2023-08-27 DIAGNOSIS — H04123 Dry eye syndrome of bilateral lacrimal glands: Secondary | ICD-10-CM | POA: Diagnosis not present

## 2023-08-27 DIAGNOSIS — H401132 Primary open-angle glaucoma, bilateral, moderate stage: Secondary | ICD-10-CM | POA: Diagnosis not present

## 2023-09-01 ENCOUNTER — Other Ambulatory Visit: Payer: Self-pay | Admitting: Internal Medicine

## 2023-09-01 DIAGNOSIS — E785 Hyperlipidemia, unspecified: Secondary | ICD-10-CM

## 2023-09-02 ENCOUNTER — Ambulatory Visit (INDEPENDENT_AMBULATORY_CARE_PROVIDER_SITE_OTHER): Payer: Medicare Other

## 2023-09-02 VITALS — BP 138/80 | HR 68 | Ht 66.0 in | Wt 199.4 lb

## 2023-09-02 DIAGNOSIS — Z0000 Encounter for general adult medical examination without abnormal findings: Secondary | ICD-10-CM

## 2023-09-02 NOTE — Progress Notes (Signed)
 Subjective:   Edwin [name] is a 83 y.o. who presents for a Medicare Wellness preventive visit.  Visit Complete: In person  Persons Participating in Visit: Patient.  AWV Questionnaire: No: Patient Medicare AWV questionnaire was not completed prior to this visit.  Cardiac Risk Factors include: advanced age (>58men, >28 women);diabetes mellitus;dyslipidemia;hypertension;male gender;obesity (BMI >30kg/m2)     Objective:    Today's Vitals   09/02/23 1356 09/02/23 1417  BP: (!) 140/80 138/80  Pulse: 68   Weight: 199 lb 6.4 oz (90.4 kg)   Height: 5\' 6"  (1.676 m)    Body mass index is 32.18 kg/m.     09/02/2023    1:55 PM 08/01/2021    2:26 PM 12/30/2018    2:50 PM 03/08/2018    3:28 PM 11/10/2017    4:21 PM 03/04/2016    5:30 PM 01/06/2016    2:57 PM  Advanced Directives  Does Patient Have a Medical Advance Directive? Yes Yes Yes Yes Yes Yes Yes  Type of Estate agent of [place];Living will Living will;Healthcare Power of [employer] Power of [place];Living will Living will Healthcare Power of [place];Living will Healthcare Power of [place];Living will Healthcare Power of [place];Living will  Does patient want to make changes to medical advance directive?  No - Patient declined  No - Patient declined  No - Patient declined No - Patient declined  Copy of Healthcare Power of Attorney in Chart? No - copy requested No - copy requested No - copy requested  No - copy requested Yes No - copy requested    Current Medications (verified) Outpatient Encounter Medications as of 09/02/2023  Medication Sig   acyclovir (ZOVIRAX) 200 MG capsule Take 1 (ONE) tablet by mouth 4 times a day during fever blister outbreak.   albuterol (VENTOLIN HFA) 108 (90 Base) MCG/ACT inhaler Inhale 2 puffs into the lungs every 4 (four) hours as needed for wheezing or shortness of breath. INHALE 1-2 PUFFS INTO THE LUNGS EVERY 4 HOURS AS NEEDED FOR WHEEZING OR SHORTNESS OF BREATH    allopurinol (ZYLOPRIM) 300 MG tablet TAKE 1 TABLET BY MOUTH EVERY DAY   aspirin (ASPIRIN CHILDRENS) 81 MG chewable tablet Chew 1 tablet (81 mg total) by mouth daily.   atorvastatin (LIPITOR) 20 MG tablet Take 1 tablet (20 mg total) by mouth daily. Schedule an appt with PCP for further refills   betamethasone dipropionate 0.05 % cream Apply topically.   brimonidine (ALPHAGAN) 0.2 % ophthalmic solution    Cyanocobalamin (VITAMIN B 12 PO) Take 1 tablet by mouth daily.   dextromethorphan-guaiFENesin (MUCINEX DM) 30-600 MG 12hr tablet Take 1 tablet by mouth 2 (two) times daily as needed for cough.   dorzolamide (TRUSOPT) 2 % ophthalmic solution Place 1 drop into both eyes 3 (three) times daily.   EPINEPHrine 0.3 mg/0.3 mL IJ SOAJ injection Inject 0.3 mg into the muscle as needed.   famotidine (PEPCID) 40 MG tablet Take 1 tablet (40 mg total) by mouth at bedtime.   Fluticasone-Umeclidin-Vilant (TRELEGY ELLIPTA) 200-62.5-25 MCG/ACT AEPB Inhale 1 Inhalation into the lungs every morning.   ipratropium-albuterol (DUONEB) 0.5-2.5 (3) MG/3ML SOLN Take 3 mLs by nebulization every 6 (six) hours as needed.   levocetirizine (XYZAL) 5 MG tablet TAKE 1 TABLET BY MOUTH DAILY AS NEEDED FOR ALLERGIES (CAN TAKE AN EXTRA DOSE DURING FLARE).   mometasone (ELOCON) 0.1 % ointment Apply topically once daily as needed   olmesartan (BENICAR) 40 MG tablet Take 1 tablet (40 mg total) by mouth  daily.   pantoprazole (PROTONIX) 40 MG tablet Take 1 tablet (40 mg total) by mouth in the morning.   predniSONE (DELTASONE) 1 MG tablet Use 4 tablets (4 mg) for all of September , then use 3 tablets (3 mg) all of October, then use 2 tablets (2 mg) all of November, then use 1 tablet (1 mg) all of December, then discontinue prednisone January 2025   ROCKLATAN 0.02-0.005 % SOLN Place 1 drop into both eyes at bedtime.   triamcinolone (NASACORT) 55 MCG/ACT AERO nasal inhaler Place 1 spray into the nose daily.   No facility-administered  encounter medications on file as of 09/02/2023.    Allergies (verified) Brimonidine and Ramipril   History: Past Medical History:  Diagnosis Date   Asthma    COPD (chronic obstructive pulmonary disease) (HCC)    reactive airway disease   Glaucoma    Gout    Hx of colonic polyps    Dr Kinnie Scales   Hyperlipidemia    NMR lipoprofile 2005: LDL 113(1416/825), HLD 37, TG 190.   Hypertension    Pneumonia    Past Surgical History:  Procedure Laterality Date   ANTERIOR CERVICAL DECOMP/DISCECTOMY FUSION N/A 03/08/2018   Procedure: ANTERIOR CERVICAL DECOMPRESSION/DISCECTOMY FUSION, INTERBODY PROSTHESIS,PLATE/SCREWS CERVICAL FOUR- CERVICAL FIVE, CERVICAL FIVE- CERVICAL SIX, CERVICAL SIX- CERVICAL SEVEN;  Surgeon: Tressie Stalker, MD;  [facility];  Service: Neurosurgery;  Laterality: N/A;  anterior   CATARACT EXTRACTION     bilat, implants   CHOLECYSTECTOMY  1995   LUMBAR LAMINECTOMY  1982   POLYPECTOMY  2003    neg 2006, Dr.Medoff   tear duct surgery  1998   for excess tearing, skin graft L foot @ age 18 1/2   Family History  Problem Relation Age of Onset   Heart attack Father 70       due to asthma flare   Asthma Father    Emphysema Father    Hypertension Mother    Cancer Mother         ? primary   Asthma Son        x2   Allergic rhinitis Neg Hx    Angioedema Neg Hx    Atopy Neg Hx    Eczema Neg Hx    Immunodeficiency Neg Hx    Urticaria Neg Hx    Social History   Socioeconomic History   Marital status: Married    Spouse name: Not on file   Number of children: 2   Years of education: Not on file   Highest education level: Not on file  Occupational History   Occupation: Theatre stage manager   Occupation: PRESIDENT    Employer: [employer]  Tobacco Use   Smoking status: Former    Current packs/day: 0.00    Average packs/day: 3.0 packs/day for 25.0 years (75.0 ttl pk-yrs)    Types: Cigarettes    Start date: 06/03/1963    Quit date: 06/03/1983    Years since quitting: 40.2     Passive exposure: Past   Smokeless tobacco: Never  Vaping Use   Vaping status: Never Used  Substance and Sexual Activity   Alcohol use: Yes    Alcohol/week: 2.0 standard drinks of alcohol    Types: 2 Glasses of wine per week    Comment: 2x a week   Drug use: No   Sexual activity: Yes  Other Topics Concern   Not on file  Social History Narrative   Regular Exercise- no  Social Drivers of Corporate investment banker Strain: Low Risk  (09/02/2023)   Overall Financial Resource Strain (CARDIA)    Difficulty of Paying Living Expenses: Not hard at all  Food Insecurity: No Food Insecurity (09/02/2023)   Hunger Vital Sign    Worried About Running Out of Food in the Last Year: Never true    Ran Out of Food in the Last Year: Never true  Transportation Needs: No Transportation Needs (09/02/2023)   PRAPARE - Administrator, Civil Service (Medical): No    Lack of Transportation (Non-Medical): No  Physical Activity: Inactive (09/02/2023)   Exercise Vital Sign    Days of Exercise per Week: 0 days    Minutes of Exercise per Session: 0 min  Stress: No Stress Concern Present (09/02/2023)   [employer] of Occupational Health - Occupational Stress Questionnaire    Feeling of Stress : Not at all  Social Connections: Moderately Integrated (09/02/2023)   Social Connection and Isolation Panel [NHANES]    Frequency of Communication with Friends and Family: More than three times a week    Frequency of Social Gatherings with Friends and Family: More than three times a week    Attends Religious Services: 1 to 4 times per year    Active Member of [employer] or Organizations: No    Attends Engineer, structural: Never    Marital Status: Married    Tobacco Counseling Counseling given: No    Clinical Intake:  Pre-visit preparation completed: Yes  Pain : No/denies pain     BMI - recorded: 32.18 Nutritional Status: BMI > 30  Obese Nutritional Risks: None Diabetes: Yes CBG  done?: No Did pt. bring in CBG monitor from home?: No  Lab Results  Component Value Date   HGBA1C 6.1 07/10/2022   HGBA1C 5.8 05/21/2021   HGBA1C 6.6 (H) 10/01/2020     How often do you need to have someone help you when you read instructions, pamphlets, or other written materials from your doctor or pharmacy?: 1 - Never  Interpreter Needed?: No  Information entered by :: Zacarias Pontes, CMA   Activities of Daily Living     09/02/2023    1:59 PM  In your present state of health, do you have any difficulty performing the following activities:  Hearing? 0  Vision? 0  Difficulty concentrating or making decisions? 0  Walking or climbing stairs? 0  Dressing or bathing? 0  Doing errands, shopping? 0  Preparing Food and eating ? N  Using the Toilet? N  In the past six months, have you accidently leaked urine? N  Do you have problems with loss of bowel control? N  Managing your Medications? N  Managing your Finances? N  Housekeeping or managing your Housekeeping? N    Patient Care Team: Etta Grandchild, MD as PCP - General (Internal Medicine) Nyoka Cowden, MD as Consulting Physician (Pulmonary Disease) Lucie Leather Alvira Philips, MD as Consulting Physician (Allergy and Immunology) Marzella Schlein., MD (Ophthalmology)  Indicate any recent Medical Services you may have received from other than [facility] providers in the past year (date may be approximate).     Assessment:   This is a routine wellness examination for Nabil.  Hearing/Vision screen Hearing Screening - Comments:: Wears hearing aids Vision Screening - Comments:: Wears rx glasses - up to date with routine eye exams with Dr Velna Ochs   Goals Addressed  This Visit's Progress     Patient Stated (pt-stated)        Patient stated he would like to get his breathing (COPD) managed better.       Depression Screen     09/02/2023    2:06 PM 06/04/2022    2:22 PM 08/01/2021    2:45 PM 05/21/2021    8:31 AM 10/01/2020     1:19 PM 10/01/2020    1:18 PM 09/08/2019    9:14 AM  PHQ 2/9 Scores  PHQ - 2 Score 0 0 0 0 0 0 0  PHQ- 9 Score 0 0  0 3  2    Fall Risk     09/02/2023    2:00 PM 06/04/2022    2:22 PM 08/01/2021    2:45 PM 10/01/2020    1:18 PM 09/08/2019    9:14 AM  Fall Risk   Falls in the past year? 0 0 0 0 0  Number falls in past yr: 0 0 0 0 0  Injury with Fall? 0 0 0 0 0  Risk for fall due to : No Fall Risks No Fall Risks No Fall Risks  Orthopedic patient  Follow up Falls prevention discussed;Falls evaluation completed Falls evaluation completed Falls evaluation completed  Falls evaluation completed    MEDICARE RISK AT HOME:  Medicare Risk at Home Any stairs in or around the home?: Yes If so, are there any without handrails?: No Home free of loose throw rugs in walkways, pet beds, electrical cords, etc?: Yes Adequate lighting in your home to reduce risk of falls?: Yes Life alert?: No Use of a cane, walker or w/c?: No Grab bars in the bathroom?: Yes Shower chair or bench in shower?: Yes Elevated toilet seat or a handicapped toilet?: No  TIMED UP AND GO:  Was the test performed?  No  Cognitive Function: 6CIT completed    11/10/2017    5:02 PM  MMSE - Mini Mental State Exam  Not completed: Refused        09/02/2023    2:07 PM  6CIT Screen  What Year? 0 points  What month? 0 points  What time? 0 points  Count back from 20 0 points  Months in reverse 0 points  Repeat phrase 0 points  Total Score 0 points    Immunizations Immunization History  Administered Date(s) Administered   Fluad Quad(high Dose 65+) 02/14/2022   Influenza Split 02/19/2012   Influenza Whole 03/09/2010, 03/17/2011   Influenza, High Dose Seasonal PF 01/18/2015, 03/03/2016, 02/17/2017, 02/22/2018, 01/17/2019, 03/04/2023   Influenza-Unspecified 03/04/2014, 03/13/2021   PFIZER(Purple Top)SARS-COV-2 Vaccination 06/23/2019, 07/13/2019, 02/20/2020, 06/07/2020   Pfizer Covid-19 Vaccine Bivalent Booster 5y-11y  03/15/2021   Pfizer(Comirnaty)Fall Seasonal Vaccine 12 years and older 03/04/2023   Pneumococcal Conjugate-13 03/09/2008, 01/23/2016   Pneumococcal Polysaccharide-23 07/08/2012, 09/08/2019   Td 06/02/2006   Tdap 01/23/2016   Zoster Recombinant(Shingrix) 01/17/2019, 05/14/2019   Zoster, Live 06/26/2010    Screening Tests Health Maintenance  Topic Date Due   OPHTHALMOLOGY EXAM  Never done   Diabetic kidney evaluation - Urine ACR  Never done   FOOT EXAM  08/24/2022   HEMOGLOBIN A1C  01/08/2023   COVID-19 Vaccine (7 - 2024-25 season) 09/02/2023   INFLUENZA VACCINE  01/01/2024   Diabetic kidney evaluation - eGFR measurement  05/18/2024   Medicare Annual Wellness (AWV)  09/01/2024   DTaP/Tdap/Td (3 - Td or Tdap) 01/22/2026   Pneumonia Vaccine 34+ Years old  Completed   Zoster Vaccines-  Shingrix  Completed   HPV VACCINES  Aged Out    Health Maintenance  Health Maintenance Due  Topic Date Due   OPHTHALMOLOGY EXAM  Never done   Diabetic kidney evaluation - Urine ACR  Never done   FOOT EXAM  08/24/2022   HEMOGLOBIN A1C  01/08/2023   COVID-19 Vaccine (7 - 2024-25 season) 09/02/2023   Health Maintenance Items Addressed:09/02/2023   Additional Screening:  Vision Screening: Recommended annual ophthalmology exams for early detection of glaucoma and other disorders of the eye.  Dental Screening: Recommended annual dental exams for proper oral hygiene  Community Resource Referral / Chronic Care Management: CRR required this visit?  No   CCM required this visit?  No     Plan:     I have personally reviewed and noted the following in the patient's chart:   Medical and social history Use of alcohol, tobacco or illicit drugs  Current medications and supplements including opioid prescriptions. Patient is not currently taking opioid prescriptions. Functional ability and status Nutritional status Physical activity Advanced directives List of other physicians Hospitalizations,  surgeries, and ER visits in previous 12 months Vitals Screenings to include cognitive, depression, and falls Referrals and appointments  In addition, I have reviewed and discussed with patient certain preventive protocols, quality metrics, and best practice recommendations. A written personalized care plan for preventive services as well as general preventive health recommendations were provided to patient.     Darreld Mclean, CMA   09/02/2023   After Visit Summary: (In Person-Declined) Patient declined AVS at this time.  Notes: Nothing significant to report at this time.

## 2023-09-02 NOTE — Patient Instructions (Signed)
 Edwin [name] , Thank you for taking time to come for your Medicare Wellness Visit. I appreciate your ongoing commitment to your health goals. Please review the following plan we discussed and let me know if I can assist you in the future.   Referrals/Orders/Follow-Ups/Clinician Recommendations: Aim for 30 minutes of exercise or brisk walking, 6-8 glasses of water, and 5 servings of fruits and vegetables each day.   This is a list of the screening recommended for you and due dates:  Health Maintenance  Topic Date Due   Eye exam for diabetics  Never done   Yearly kidney health urinalysis for diabetes  Never done   Complete foot exam   08/24/2022   Hemoglobin A1C  01/08/2023   COVID-19 Vaccine (7 - 2024-25 season) 09/02/2023   Flu Shot  01/01/2024   Yearly kidney function blood test for diabetes  05/18/2024   Medicare Annual Wellness Visit  09/01/2024   DTaP/Tdap/Td vaccine (3 - Td or Tdap) 01/22/2026   Pneumonia Vaccine  Completed   Zoster (Shingles) Vaccine  Completed   HPV Vaccine  Aged Out    Advanced directives: (Copy Requested) Please bring a copy of your health care power of attorney and living will to the office to be added to your chart at your convenience. You can mail to [facility] [address] [place], [place] [address] or email to [email or web address]  Next Medicare Annual Wellness Visit scheduled for next year: Yes

## 2023-09-25 DIAGNOSIS — D225 Melanocytic nevi of trunk: Secondary | ICD-10-CM | POA: Diagnosis not present

## 2023-09-25 DIAGNOSIS — D0362 Melanoma in situ of left upper limb, including shoulder: Secondary | ICD-10-CM | POA: Diagnosis not present

## 2023-09-25 DIAGNOSIS — C44519 Basal cell carcinoma of skin of other part of trunk: Secondary | ICD-10-CM | POA: Diagnosis not present

## 2023-09-25 DIAGNOSIS — C44619 Basal cell carcinoma of skin of left upper limb, including shoulder: Secondary | ICD-10-CM | POA: Diagnosis not present

## 2023-09-27 ENCOUNTER — Other Ambulatory Visit: Payer: Self-pay | Admitting: Internal Medicine

## 2023-09-27 DIAGNOSIS — E785 Hyperlipidemia, unspecified: Secondary | ICD-10-CM

## 2023-09-30 ENCOUNTER — Encounter: Payer: Self-pay | Admitting: Internal Medicine

## 2023-09-30 ENCOUNTER — Ambulatory Visit (INDEPENDENT_AMBULATORY_CARE_PROVIDER_SITE_OTHER)

## 2023-09-30 ENCOUNTER — Ambulatory Visit (INDEPENDENT_AMBULATORY_CARE_PROVIDER_SITE_OTHER): Admitting: Internal Medicine

## 2023-09-30 VITALS — BP 136/68 | HR 89 | Temp 98.7°F | Ht 66.0 in | Wt 195.0 lb

## 2023-09-30 DIAGNOSIS — M1000 Idiopathic gout, unspecified site: Secondary | ICD-10-CM | POA: Diagnosis not present

## 2023-09-30 DIAGNOSIS — R058 Other specified cough: Secondary | ICD-10-CM | POA: Diagnosis not present

## 2023-09-30 DIAGNOSIS — J8489 Other specified interstitial pulmonary diseases: Secondary | ICD-10-CM | POA: Diagnosis not present

## 2023-09-30 DIAGNOSIS — E118 Type 2 diabetes mellitus with unspecified complications: Secondary | ICD-10-CM | POA: Diagnosis not present

## 2023-09-30 DIAGNOSIS — Z981 Arthrodesis status: Secondary | ICD-10-CM | POA: Diagnosis not present

## 2023-09-30 DIAGNOSIS — I10 Essential (primary) hypertension: Secondary | ICD-10-CM | POA: Diagnosis not present

## 2023-09-30 DIAGNOSIS — J449 Chronic obstructive pulmonary disease, unspecified: Secondary | ICD-10-CM

## 2023-09-30 DIAGNOSIS — E876 Hypokalemia: Secondary | ICD-10-CM | POA: Diagnosis not present

## 2023-09-30 DIAGNOSIS — E785 Hyperlipidemia, unspecified: Secondary | ICD-10-CM | POA: Diagnosis not present

## 2023-09-30 DIAGNOSIS — R052 Subacute cough: Secondary | ICD-10-CM

## 2023-09-30 DIAGNOSIS — R9389 Abnormal findings on diagnostic imaging of other specified body structures: Secondary | ICD-10-CM | POA: Diagnosis not present

## 2023-09-30 DIAGNOSIS — N1831 Chronic kidney disease, stage 3a: Secondary | ICD-10-CM

## 2023-09-30 LAB — CBC WITH DIFFERENTIAL/PLATELET
Basophils Absolute: 0 10*3/uL (ref 0.0–0.1)
Basophils Relative: 0.5 % (ref 0.0–3.0)
Eosinophils Absolute: 0.1 10*3/uL (ref 0.0–0.7)
Eosinophils Relative: 1.7 % (ref 0.0–5.0)
HCT: 44.9 % (ref 39.0–52.0)
Hemoglobin: 15 g/dL (ref 13.0–17.0)
Lymphocytes Relative: 22.9 % (ref 12.0–46.0)
Lymphs Abs: 1.6 10*3/uL (ref 0.7–4.0)
MCHC: 33.5 g/dL (ref 30.0–36.0)
MCV: 96.8 fl (ref 78.0–100.0)
Monocytes Absolute: 0.7 10*3/uL (ref 0.1–1.0)
Monocytes Relative: 9.8 % (ref 3.0–12.0)
Neutro Abs: 4.6 10*3/uL (ref 1.4–7.7)
Neutrophils Relative %: 65.1 % (ref 43.0–77.0)
Platelets: 216 10*3/uL (ref 150.0–400.0)
RBC: 4.64 Mil/uL (ref 4.22–5.81)
RDW: 15.6 % — ABNORMAL HIGH (ref 11.5–15.5)
WBC: 7.1 10*3/uL (ref 4.0–10.5)

## 2023-09-30 LAB — BASIC METABOLIC PANEL WITH GFR
BUN: 10 mg/dL (ref 6–23)
CO2: 34 meq/L — ABNORMAL HIGH (ref 19–32)
Calcium: 8.9 mg/dL (ref 8.4–10.5)
Chloride: 97 meq/L (ref 96–112)
Creatinine, Ser: 1.07 mg/dL (ref 0.40–1.50)
GFR: 64.78 mL/min (ref >60.00)
Glucose, Bld: 108 mg/dL — ABNORMAL HIGH (ref 70–99)
Potassium: 3.2 meq/L — ABNORMAL LOW (ref 3.5–5.1)
Sodium: 141 meq/L (ref 135–145)

## 2023-09-30 LAB — URIC ACID: Uric Acid, Serum: 7.9 mg/dL — ABNORMAL HIGH (ref 4.0–7.8)

## 2023-09-30 LAB — HEMOGLOBIN A1C: Hgb A1c MFr Bld: 5.7 % (ref 4.6–6.5)

## 2023-09-30 MED ORDER — ATORVASTATIN CALCIUM 20 MG PO TABS: 20.0000 mg | ORAL_TABLET | Freq: Every day | ORAL | 0 refills | Status: DC

## 2023-09-30 MED ORDER — ALLOPURINOL 100 MG PO TABS: 100.0000 mg | ORAL_TABLET | Freq: Every day | ORAL | 0 refills | Status: DC

## 2023-09-30 NOTE — Patient Instructions (Signed)
 Hypertension, Adult High blood pressure (hypertension) is when the force of blood pumping through the arteries is too strong. The arteries are the blood vessels that carry blood from the heart throughout the body. Hypertension forces the heart to work harder to pump blood and may cause arteries to become narrow or stiff. Untreated or uncontrolled hypertension can lead to a heart attack, heart failure, a stroke, kidney disease, and other problems. A blood pressure reading consists of a higher number over a lower number. Ideally, your blood pressure should be below 120/80. The first ("top") number is called the systolic pressure. It is a measure of the pressure in your arteries as your heart beats. The second ("bottom") number is called the diastolic pressure. It is a measure of the pressure in your arteries as the heart relaxes. What are the causes? The exact cause of this condition is not known. There are some conditions that result in high blood pressure. What increases the risk? Certain factors may make you more likely to develop high blood pressure. Some of these risk factors are under your control, including: Smoking. Not getting enough exercise or physical activity. Being overweight. Having too much fat, sugar, calories, or salt (sodium) in your diet. Drinking too much alcohol. Other risk factors include: Having a personal history of heart disease, diabetes, high cholesterol, or kidney disease. Stress. Having a family history of high blood pressure and high cholesterol. Having obstructive sleep apnea. Age. The risk increases with age. What are the signs or symptoms? High blood pressure may not cause symptoms. Very high blood pressure (hypertensive crisis) may cause: Headache. Fast or irregular heartbeats (palpitations). Shortness of breath. Nosebleed. Nausea and vomiting. Vision changes. Severe chest pain, dizziness, and seizures. How is this diagnosed? This condition is diagnosed by  measuring your blood pressure while you are seated, with your arm resting on a flat surface, your legs uncrossed, and your feet flat on the floor. The cuff of the blood pressure monitor will be placed directly against the skin of your upper arm at the level of your heart. Blood pressure should be measured at least twice using the same arm. Certain conditions can cause a difference in blood pressure between your right and left arms. If you have a high blood pressure reading during one visit or you have normal blood pressure with other risk factors, you may be asked to: Return on a different day to have your blood pressure checked again. Monitor your blood pressure at home for 1 week or longer. If you are diagnosed with hypertension, you may have other blood or imaging tests to help your health care provider understand your overall risk for other conditions. How is this treated? This condition is treated by making healthy lifestyle changes, such as eating healthy foods, exercising more, and reducing your alcohol intake. You may be referred for counseling on a healthy diet and physical activity. Your health care provider may prescribe medicine if lifestyle changes are not enough to get your blood pressure under control and if: Your systolic blood pressure is above 130. Your diastolic blood pressure is above 80. Your personal target blood pressure may vary depending on your medical conditions, your age, and other factors. Follow these instructions at home: Eating and drinking  Eat a diet that is high in fiber and potassium, and low in sodium, added sugar, and fat. An example of this eating plan is called the DASH diet. DASH stands for Dietary Approaches to Stop Hypertension. To eat this way: Eat  plenty of fresh fruits and vegetables. Try to fill one half of your plate at each meal with fruits and vegetables. Eat whole grains, such as whole-wheat pasta, brown rice, or whole-grain bread. Fill about one  fourth of your plate with whole grains. Eat or drink low-fat dairy products, such as skim milk or low-fat yogurt. Avoid fatty cuts of meat, processed or cured meats, and poultry with skin. Fill about one fourth of your plate with lean proteins, such as fish, chicken without skin, beans, eggs, or tofu. Avoid pre-made and processed foods. These tend to be higher in sodium, added sugar, and fat. Reduce your daily sodium intake. Many people with hypertension should eat less than 1,500 mg of sodium a day. Do not drink alcohol if: Your health care provider tells you not to drink. You are pregnant, may be pregnant, or are planning to become pregnant. If you drink alcohol: Limit how much you have to: 0-1 drink a day for women. 0-2 drinks a day for men. Know how much alcohol is in your drink. In the [place] one drink equals one 12 oz bottle of beer (355 mL), one 5 oz glass of wine (148 mL), or one 1 oz glass of hard liquor (44 mL). Lifestyle  Work with your health care provider to maintain a healthy body weight or to lose weight. Ask what an ideal weight is for you. Get at least 30 minutes of exercise that causes your heart to beat faster (aerobic exercise) most days of the week. Activities may include walking, swimming, or biking. Include exercise to strengthen your muscles (resistance exercise), such as Pilates or lifting weights, as part of your weekly exercise routine. Try to do these types of exercises for 30 minutes at least 3 days a week. Do not use any products that contain nicotine or tobacco. These products include cigarettes, chewing tobacco, and vaping devices, such as e-cigarettes. If you need help quitting, ask your health care provider. Monitor your blood pressure at home as told by your health care provider. Keep all follow-up visits. This is important. Medicines Take over-the-counter and prescription medicines only as told by your health care provider. Follow directions carefully. Blood  pressure medicines must be taken as prescribed. Do not skip doses of blood pressure medicine. Doing this puts you at risk for problems and can make the medicine less effective. Ask your health care provider about side effects or reactions to medicines that you should watch for. Contact a health care provider if you: Think you are having a reaction to a medicine you are taking. Have headaches that keep coming back (recurring). Feel dizzy. Have swelling in your ankles. Have trouble with your vision. Get help right away if you: Develop a severe headache or confusion. Have unusual weakness or numbness. Feel faint. Have severe pain in your chest or abdomen. Vomit repeatedly. Have trouble breathing. These symptoms may be an emergency. Get help right away. Call 911. Do not wait to see if the symptoms will go away. Do not drive yourself to the hospital. Summary Hypertension is when the force of blood pumping through your arteries is too strong. If this condition is not controlled, it may put you at risk for serious complications. Your personal target blood pressure may vary depending on your medical conditions, your age, and other factors. For most people, a normal blood pressure is less than 120/80. Hypertension is treated with lifestyle changes, medicines, or a combination of both. Lifestyle changes include losing weight, eating a healthy,  low-sodium diet, exercising more, and limiting alcohol. This information is not intended to replace advice given to you by your health care provider. Make sure you discuss any questions you have with your health care provider. Document Revised: 03/26/2021 Document Reviewed: 03/26/2021 Elsevier Patient Education  2024 [employer].

## 2023-09-30 NOTE — Progress Notes (Addendum)
 Subjective:  Patient ID: Edwin [name], male    DOB: [birth year]  Age: 83 y.o. MRN: [id number]  CC: Hypertension, COPD, Hyperlipidemia, and Diabetes   HPI Edwin [name] presents for f/up ----  Discussed the use of AI scribe software for clinical note transcription with the patient, who gave verbal consent to proceed.  History of Present Illness   Edwin [name] is an 83 year old male with COPD who presents with fatigue and respiratory symptoms.  He experiences a general decrease in energy, describing it as 'just fatigue'.  His COPD symptoms include coughing up phlegm that is slightly yellow in color. No hemoptysis, fever, chills, chest pain, or wheezing. He is using all his prescribed inhalers.  He is currently taking atorvastatin  for cholesterol management without experiencing muscle aches. No symptoms of polydipsia, polyuria, or significant dizziness, although he occasionally feels mild dizziness upon standing, which resolves quickly.       Outpatient Medications Prior to Visit  Medication Sig Dispense Refill   acyclovir  (ZOVIRAX ) 200 MG capsule Take 1 (ONE) tablet by mouth 4 times a day during fever blister outbreak. 120 capsule 5   albuterol  (VENTOLIN  HFA) 108 (90 Base) MCG/ACT inhaler Inhale 2 puffs into the lungs every 4 (four) hours as needed for wheezing or shortness of breath. INHALE 1-2 PUFFS INTO THE LUNGS EVERY 4 HOURS AS NEEDED FOR WHEEZING OR SHORTNESS OF BREATH 18 g 1   aspirin  (ASPIRIN  CHILDRENS) 81 MG chewable tablet Chew 1 tablet (81 mg total) by mouth daily.     betamethasone  dipropionate 0.05 % cream Apply topically.     brimonidine  (ALPHAGAN ) 0.2 % ophthalmic solution      Cyanocobalamin (VITAMIN B 12 PO) Take 1 tablet by mouth daily.     dorzolamide (TRUSOPT) 2 % ophthalmic solution Place 1 drop into both eyes 3 (three) times daily.     EPINEPHrine  0.3 mg/0.3 mL IJ SOAJ injection Inject 0.3 mg into the muscle as needed.     famotidine  (PEPCID ) 40 MG tablet  Take 1 tablet (40 mg total) by mouth at bedtime. 90 tablet 1   Fluticasone -Umeclidin-Vilant (TRELEGY ELLIPTA ) 200-62.5-25 MCG/ACT AEPB Inhale 1 Inhalation into the lungs every morning. 84 each 1   ipratropium-albuterol  (DUONEB) 0.5-2.5 (3) MG/3ML SOLN Take 3 mLs by nebulization every 6 (six) hours as needed. 150 mL 2   levocetirizine (XYZAL ) 5 MG tablet TAKE 1 TABLET BY MOUTH DAILY AS NEEDED FOR ALLERGIES (CAN TAKE AN EXTRA DOSE DURING FLARE). 90 tablet 1   mometasone  (ELOCON ) 0.1 % ointment Apply topically once daily as needed 45 g 1   olmesartan  (BENICAR ) 40 MG tablet Take 1 tablet (40 mg total) by mouth daily. 90 tablet 1   pantoprazole  (PROTONIX ) 40 MG tablet Take 1 tablet (40 mg total) by mouth in the morning. 90 tablet 1   predniSONE  (DELTASONE ) 1 MG tablet Use 4 tablets (4 mg) for all of September , then use 3 tablets (3 mg) all of October, then use 2 tablets (2 mg) all of November, then use 1 tablet (1 mg) all of December, then discontinue prednisone  January 2025 300 tablet 0   ROCKLATAN 0.02-0.005 % SOLN Place 1 drop into both eyes at bedtime.     triamcinolone  (NASACORT ) 55 MCG/ACT AERO nasal inhaler Place 1 spray into the nose daily. 50.7 mL 1   allopurinol  (ZYLOPRIM ) 300 MG tablet TAKE 1 TABLET BY MOUTH EVERY DAY 90 tablet 0   atorvastatin  (LIPITOR) 20 MG tablet Take 1 tablet (  20 mg total) by mouth daily. Schedule an appt with PCP for further refills 30 tablet 0   dextromethorphan -guaiFENesin  (MUCINEX  DM) 30-600 MG 12hr tablet Take 1 tablet by mouth 2 (two) times daily as needed for cough. (Patient not taking: Reported on 09/30/2023) 60 tablet 3   No facility-administered medications prior to visit.    ROS Review of Systems  Constitutional:  Positive for fatigue. Negative for appetite change, chills, diaphoresis and fever.  HENT: Negative.  Negative for trouble swallowing.   Eyes:  Negative for visual disturbance.  Respiratory:  Positive for cough, shortness of breath and wheezing.  Negative for choking and stridor.   Cardiovascular:  Negative for chest pain, palpitations and leg swelling.  Gastrointestinal:  Negative for abdominal pain, constipation, diarrhea, nausea and vomiting.  Endocrine: Negative.   Genitourinary: Negative.  Negative for difficulty urinating and dysuria.  Musculoskeletal: Negative.  Negative for arthralgias, joint swelling and myalgias.  Skin: Negative.  Negative for color change, pallor and rash.  Neurological:  Negative for dizziness and tremors.  Hematological:  Negative for adenopathy. Does not bruise/bleed easily.  Psychiatric/Behavioral: Negative.      Objective:  BP 136/68 (BP Location: Left Arm, Patient Position: Sitting)   Pulse 89   Temp 98.7 F (37.1 C) (Temporal)   Ht 5\' 6"  (1.676 m)   Wt 195 lb (88.5 kg)   SpO2 94%   BMI 31.47 kg/m   BP Readings from Last 3 Encounters:  09/30/23 136/68  09/02/23 138/80  05/19/23 138/82    Wt Readings from Last 3 Encounters:  09/30/23 195 lb (88.5 kg)  09/02/23 199 lb 6.4 oz (90.4 kg)  05/19/23 189 lb 11.2 oz (86 kg)    Physical Exam Vitals reviewed.  Constitutional:      General: He is not in acute distress.    Appearance: Normal appearance. He is not toxic-appearing or diaphoretic.  HENT:     Nose: Nose normal.     Mouth/Throat:     Mouth: Mucous membranes are moist.  Eyes:     General: No scleral icterus.    Conjunctiva/sclera: Conjunctivae normal.  Cardiovascular:     Rate and Rhythm: Normal rate and regular rhythm. Occasional Extrasystoles are present.    Heart sounds: No murmur heard.    No friction rub. No gallop.     Comments: EKG- SR with occasional PVC, 92 bpm LAD RBBB No LVH unchanged Pulmonary:     Effort: Pulmonary effort is normal.     Breath sounds: Examination of the right-middle field reveals rhonchi. Examination of the left-middle field reveals rhonchi. Examination of the right-lower field reveals decreased breath sounds. Examination of the left-lower  field reveals rhonchi. Decreased breath sounds and rhonchi present. No wheezing or rales.  Chest:     Chest wall: No tenderness.  Abdominal:     General: Abdomen is flat.     Palpations: There is no mass.     Tenderness: There is no abdominal tenderness. There is no guarding.     Hernia: No hernia is present.  Musculoskeletal:        General: Normal range of motion.     Cervical back: Neck supple.     Right lower leg: No edema.     Left lower leg: No edema.  Lymphadenopathy:     Cervical: No cervical adenopathy.  Skin:    General: Skin is warm and dry.     Findings: No rash.  Neurological:     Mental Status: He is  alert. Mental status is at baseline.  Psychiatric:        Mood and Affect: Mood normal.        Behavior: Behavior normal.     Lab Results  Component Value Date   WBC 7.1 09/30/2023   HGB 15.0 09/30/2023   HCT 44.9 09/30/2023   PLT 216.0 09/30/2023   GLUCOSE 108 (H) 09/30/2023   CHOL 151 07/10/2022   TRIG 226.0 (H) 07/10/2022   HDL 62.00 07/10/2022   LDLDIRECT 64.0 07/10/2022   LDLCALC 43 09/08/2019   ALT 19 05/19/2023   AST 17 05/19/2023   NA 141 09/30/2023   K 3.2 (L) 09/30/2023   CL 97 09/30/2023   CREATININE 1.07 09/30/2023   BUN 10 09/30/2023   CO2 34 (H) 09/30/2023   TSH 1.990 05/19/2023   PSA 0.87 05/05/2016   HGBA1C 5.7 09/30/2023   MICROALBUR 2.5 (H) 09/30/2023    DG Chest 2 View Result Date: 05/10/2022 CLINICAL DATA:  Cough and shortness of breath EXAM: CHEST - 2 VIEW COMPARISON:  Chest radiograph dated 05/21/2021 FINDINGS: Similar to slightly increased asymmetric elevation of the right hemidiaphragm. Normal left lung volume. No focal consolidations. Again seen are subpleural reticulations in the right lung. No pleural effusion or pneumothorax. The heart size and mediastinal contours are within normal limits. Partially imaged cervical spinal fixation hardware appears intact. IMPRESSION: 1. Similar to slightly increased asymmetric elevation of  the right hemidiaphragm. 2. Persistent subpleural reticulations in the right lung, which may be related to interstitial lung disease. No focal consolidations. Electronically Signed   By: Limin  Xu M.D.   On: 05/10/2022 17:25   DG Chest 2 View Result Date: 09/30/2023 EXAM: 2 VIEW(S) XRAY OF THE CHEST 09/30/2023 01:46:58 PM COMPARISON: 05/09/2022 chest radiograph. CLINICAL HISTORY: Productive cough. Hx of COPD. FINDINGS: LUNGS AND PLEURA: No consolidation. No pulmonary edema. No pleural effusion. No pneumothorax. Chronic moderate elevation of the right hemidiaphragm. HEART AND MEDIASTINUM: No acute abnormality of the cardiac and mediastinal silhouettes. BONES AND SOFT TISSUES: No acute osseous abnormality. Partially visualized surgical hardware from ACDF in the lower cervical spine. Cholecystectomy clips in the right upper quadrant of the abdomen. IMPRESSION: 1. No acute findings. 2. Chronic moderate elevation of the right hemidiaphragm. Electronically signed by: Karlyn Overman MD 09/30/2023 02:00 PM EDT RP Workstation: [id number]     Assessment & Plan:  Subacute cough -     DG Chest 2 View; Future  Hyperlipidemia with target LDL less than 130 -     Atorvastatin  Calcium ; Take 1 tablet (20 mg total) by mouth daily.  Dispense: 90 tablet; Refill: 0  Type II diabetes mellitus with manifestations (HCC) -     Urinalysis, Routine w reflex microscopic; Future -     Hemoglobin A1c; Future -     Microalbumin / creatinine urine ratio; Future -     Basic metabolic panel with GFR; Future  Stage 3a chronic kidney disease (HCC)- Will avoid nephrotoxic agents  -     Urinalysis, Routine w reflex microscopic; Future -     Microalbumin / creatinine urine ratio; Future -     Basic metabolic panel with GFR; Future  NSIP (nonspecific interstitial pneumonia) (HCC)  COPD GOLD 0 s/p smoking cessation in 1985  Essential hypertension- BP is well controlled. EKG is negative for LVH. -     Urinalysis, Routine w reflex  microscopic; Future -     EKG 12-Lead -     Basic metabolic panel with GFR; Future -  CBC with Differential/Platelet; Future -     AMB Referral [facility] Care Management  Idiopathic gout, unspecified chronicity, unspecified site- Uric acid is elevated. Will restart the XOI. -     Uric acid; Future -     Basic metabolic panel with GFR; Future -     Allopurinol ; Take 1 tablet (100 mg total) by mouth daily.  Dispense: 90 tablet; Refill: 0 -     AMB Referral [facility] Care Management  Hypokalemia due to inadequate potassium intake -     Potassium Chloride  ER; Take 1 tablet (10 mEq total) by mouth 2 (two) times daily.  Dispense: 180 tablet; Refill: 0 -     AMB Referral [facility] Care Management     Follow-up: Return in about 4 months (around 01/30/2024).  Sandra Crouch, MD

## 2023-10-01 ENCOUNTER — Encounter: Payer: Self-pay | Admitting: Internal Medicine

## 2023-10-01 DIAGNOSIS — E876 Hypokalemia: Secondary | ICD-10-CM | POA: Insufficient documentation

## 2023-10-01 LAB — URINALYSIS, ROUTINE W REFLEX MICROSCOPIC
Bilirubin Urine: NEGATIVE
Hgb urine dipstick: NEGATIVE
Ketones, ur: NEGATIVE
Leukocytes,Ua: NEGATIVE
Nitrite: NEGATIVE
RBC / HPF: NONE SEEN (ref 0–?)
Specific Gravity, Urine: 1.02 (ref 1.000–1.030)
Total Protein, Urine: NEGATIVE
Urine Glucose: NEGATIVE
Urobilinogen, UA: 1 (ref 0.0–1.0)
pH: 6 (ref 5.0–8.0)

## 2023-10-01 LAB — MICROALBUMIN / CREATININE URINE RATIO
Creatinine,U: 138.7 mg/dL
Microalb Creat Ratio: 18 mg/g (ref 0.0–30.0)
Microalb, Ur: 2.5 mg/dL — ABNORMAL HIGH (ref 0.0–1.9)

## 2023-10-01 MED ORDER — POTASSIUM CHLORIDE ER 10 MEQ PO TBCR: 10.0000 meq | EXTENDED_RELEASE_TABLET | Freq: Two times a day (BID) | ORAL | 0 refills | Status: DC

## 2023-10-07 DIAGNOSIS — L988 Other specified disorders of the skin and subcutaneous tissue: Secondary | ICD-10-CM | POA: Diagnosis not present

## 2023-10-07 DIAGNOSIS — D0362 Melanoma in situ of left upper limb, including shoulder: Secondary | ICD-10-CM | POA: Diagnosis not present

## 2023-10-08 ENCOUNTER — Telehealth: Payer: Self-pay | Admitting: *Deleted

## 2023-10-08 NOTE — Progress Notes (Signed)
 Care Guide Pharmacy Note  10/08/2023 Name: [name] MRN: [id number] DOB: 01-13-1941  Referred By: Arcadio Knuckles, MD Reason for referral: Complex Care Management (Outreach to schedule referral with pharmacist )   Edwin [name] is a 83 y.o. year old male who is a primary care patient of Arcadio Knuckles, MD.  Marna Singleton Vandermeulen was referred to the pharmacist for assistance related to: HTN  Successful contact was made with the patient to discuss pharmacy services including being ready for the pharmacist to call at least 5 minutes before the scheduled appointment time and to have medication bottles and any blood pressure readings ready for review. The patient agreed to meet with the pharmacist via telephone visit on 10/22/2023  Kandis Ormond, CMA [place]  [facility], [facility] Guide Direct Dial: [phone]  Fax: [phone] Website: [email or web address]

## 2023-10-12 DIAGNOSIS — H31011 Macula scars of posterior pole (postinflammatory) (post-traumatic), right eye: Secondary | ICD-10-CM | POA: Diagnosis not present

## 2023-10-12 DIAGNOSIS — H04123 Dry eye syndrome of bilateral lacrimal glands: Secondary | ICD-10-CM | POA: Diagnosis not present

## 2023-10-12 DIAGNOSIS — Z961 Presence of intraocular lens: Secondary | ICD-10-CM | POA: Diagnosis not present

## 2023-10-12 DIAGNOSIS — H401132 Primary open-angle glaucoma, bilateral, moderate stage: Secondary | ICD-10-CM | POA: Diagnosis not present

## 2023-10-21 ENCOUNTER — Ambulatory Visit (INDEPENDENT_AMBULATORY_CARE_PROVIDER_SITE_OTHER): Payer: Medicare Other | Admitting: Podiatry

## 2023-10-21 ENCOUNTER — Encounter: Payer: Self-pay | Admitting: Podiatry

## 2023-10-21 DIAGNOSIS — B351 Tinea unguium: Secondary | ICD-10-CM

## 2023-10-21 DIAGNOSIS — N1831 Chronic kidney disease, stage 3a: Secondary | ICD-10-CM

## 2023-10-21 DIAGNOSIS — E118 Type 2 diabetes mellitus with unspecified complications: Secondary | ICD-10-CM | POA: Diagnosis not present

## 2023-10-21 DIAGNOSIS — M79674 Pain in right toe(s): Secondary | ICD-10-CM

## 2023-10-21 DIAGNOSIS — M79675 Pain in left toe(s): Secondary | ICD-10-CM

## 2023-10-21 NOTE — Progress Notes (Signed)
This patient returns to my office for at risk foot care.  This patient requires this care by a professional since this patient will be at risk due to having diabetes.  This patient is unable to cut nails himself since the patient cannot reach his nails.These nails are painful walking and wearing shoes.  This patient presents for at risk foot care today.  General Appearance  Alert, conversant and in no acute stress.  Vascular  Dorsalis pedis and posterior tibial  pulses are palpable  bilaterally.  Capillary return is within normal limits  bilaterally. Temperature is within normal limits  bilaterally.  Neurologic  Senn-Weinstein monofilament wire test within normal limits  bilaterally. Muscle power within normal limits bilaterally.  Nails Thick disfigured discolored nails with subungual debris  from hallux to fifth toes bilaterally. No evidence of bacterial infection or drainage bilaterally.  Orthopedic  No limitations of motion  feet .  No crepitus or effusions noted.  No bony pathology or digital deformities noted.  Skin  normotropic skin with no porokeratosis noted bilaterally.  No signs of infections or ulcers noted.     Onychomycosis  Pain in right toes  Pain in left toes  Consent was obtained for treatment procedures.   Mechanical debridement of nails 1-5  bilaterally performed with a nail nipper.  Filed with dremel without incident.    Return office visit  10 weeks                    Told patient to return for periodic foot care and evaluation due to potential at risk complications.   Reesa Gotschall DPM   

## 2023-10-22 ENCOUNTER — Other Ambulatory Visit (INDEPENDENT_AMBULATORY_CARE_PROVIDER_SITE_OTHER): Admitting: Pharmacist

## 2023-10-22 DIAGNOSIS — I10 Essential (primary) hypertension: Secondary | ICD-10-CM

## 2023-10-22 DIAGNOSIS — M1000 Idiopathic gout, unspecified site: Secondary | ICD-10-CM

## 2023-10-22 NOTE — Progress Notes (Signed)
 10/22/2023 Name: Edwin [name] MRN: [id number] DOB: [birth year]  Chief Complaint  Patient presents with   Hypertension   Medication Management   Hypokalemia   Gout    Edwin [name] is a 83 y.o. year old male who presented for a telephone visit.   They were referred to the pharmacist by their PCP for assistance in managing hypertension, hypokalemia, and gout.   Subjective:  Care Team: Primary Care Provider: Arcadio Knuckles, MD ; Next Scheduled Visit: 10/05/24  Medication Access/Adherence  Current Pharmacy:  [id number] [place] - [name]  [address] Phone: [phone] Fax: [phone]   Patient reports affordability concerns with their medications: No  Patient reports access/transportation concerns to their pharmacy: No  Patient reports adherence concerns with their medications:  No    Hypertension:  Current medications: olmesartan  40 mg daily (last filled 08/2022; patient states he has been taking daily) - Denies need for refill  Medications previously tried: amlodipine  (while in hospital), olmesartan -hydrochlorothiazide  (stopped hydrochlorothiazide  due to elevated Scr), losartan  (switched to olmesartan )  Patient does not have a validated, automated, upper arm home BP cuff Current blood pressure readings readings: not checking  Current meal patterns: even split between cooking and eating out for meals; will occasionally add salt to foods while cooking and at the table; eats canned, frozen, and fresh vegetables  Current physical activity: limited activity due to COPD  Gout  Current medication: allopurinol  100 mg daily  Denies recent gout flare. Had swollen ankles at last PCP visit when it was discovered he had elevated uric acid level, but no tenderness or pain.  Denies any recent changes in diet that would increase uric acid levels.  History of frequent gout flares prior to being prescribed allopurinol   in the past.   Objective:  Lab Results  Component Value Date   HGBA1C 5.7 09/30/2023    Lab Results  Component Value Date   CREATININE 1.07 09/30/2023   BUN 10 09/30/2023   NA 141 09/30/2023   K 3.2 (L) 09/30/2023   CL 97 09/30/2023   CO2 34 (H) 09/30/2023    Lab Results  Component Value Date   CHOL 151 07/10/2022   HDL 62.00 07/10/2022   LDLCALC 43 09/08/2019   LDLDIRECT 64.0 07/10/2022   TRIG 226.0 (H) 07/10/2022   CHOLHDL 2 07/10/2022    Medications Reviewed Today     Reviewed by Marybelle Smiling, RPH (Pharmacist) on 10/22/23 at 1009  Med List Status: <None>   Medication Order Taking? Sig Documenting Provider Last Dose Status Informant  acyclovir  (ZOVIRAX ) 200 MG capsule [id number]  Take 1 (ONE) tablet by mouth 4 times a day during fever blister outbreak. [name], Edwin Care, MD  Active   albuterol  (VENTOLIN  HFA) 108 [phone] Base) MCG/ACT inhaler [id number]  Inhale 2 puffs into the lungs every 4 (four) hours as needed for wheezing or shortness of breath. INHALE 1-2 PUFFS INTO THE LUNGS EVERY 4 HOURS AS NEEDED FOR WHEEZING OR SHORTNESS OF BREATH [name], Edwin Care, MD  Active   allopurinol  (ZYLOPRIM ) 100 MG tablet [id number] Yes Take 1 tablet (100 mg total) by mouth daily. Edwin Knuckles, MD Taking Active   aspirin  (ASPIRIN  CHILDRENS) 81 MG chewable tablet [id number]  Chew 1 tablet (81 mg total) by mouth daily. Knox Perl, MD  Active   atorvastatin  (LIPITOR) 20 MG tablet [id number]  Take 1 tablet (20 mg total) by mouth daily. Edwin Knuckles, MD  Active  betamethasone  dipropionate 0.05 % cream [id number]  Apply topically. [provider]  Active   brimonidine  (ALPHAGAN ) 0.2 % ophthalmic solution [id number]   [provider]  Active   Cyanocobalamin (VITAMIN B 12 PO) [id number]  Take 1 tablet by mouth daily. [provider]  Active Self  dextromethorphan -guaiFENesin  (MUCINEX  DM) 30-600 MG 12hr tablet [id number]  Take 1 tablet by mouth 2 (two) times daily as needed  for cough. [name], Edwin J, MD  Active   dorzolamide (TRUSOPT) 2 % ophthalmic solution [id number]  Place 1 drop into both eyes 3 (three) times daily. [provider]  Active   EPINEPHrine  0.3 mg/0.3 mL IJ SOAJ injection [id number]  Inject 0.3 mg into the muscle as needed. [provider]  Active   famotidine  (PEPCID ) 40 MG tablet [id number]  Take 1 tablet (40 mg total) by mouth at bedtime. [name], Edwin J, MD  Active   Fluticasone -Umeclidin-Vilant (TRELEGY ELLIPTA ) 200-62.5-25 MCG/ACT AEPB [id number]  Inhale 1 Inhalation into the lungs every morning. [name], Edwin Care, MD  Active   ipratropium-albuterol  (DUONEB) 0.5-2.5 (3) MG/3ML SOLN [id number]  Take 3 mLs by nebulization every 6 (six) hours as needed. [name], Edwin J, MD  Active   levocetirizine (XYZAL ) 5 MG tablet [id number]  TAKE 1 TABLET BY MOUTH DAILY AS NEEDED FOR ALLERGIES (CAN TAKE AN EXTRA DOSE DURING FLARE). [name], Edwin Care, MD  Active   mometasone  (ELOCON ) 0.1 % ointment [id number]  Apply topically once daily as needed [name], Edwin Care, MD  Active   olmesartan  (BENICAR ) 40 MG tablet [id number] Yes Take 1 tablet (40 mg total) by mouth daily. [name], Edwin Jose, MD Taking Active   pantoprazole  (PROTONIX ) 40 MG tablet [id number]  Take 1 tablet (40 mg total) by mouth in the morning. [name], Edwin Care, MD  Active   potassium chloride  (KLOR-CON  10) 10 MEQ tablet [id number] Yes Take 1 tablet (10 mEq total) by mouth 2 (two) times daily. Edwin Knuckles, MD Taking Active   predniSONE  (DELTASONE ) 1 MG tablet [id number]  Use 4 tablets (4 mg) for all of September , then use 3 tablets (3 mg) all of October, then use 2 tablets (2 mg) all of November, then use 1 tablet (1 mg) all of December, then discontinue prednisone  January 2025 [name], Edwin J, MD  Active   ROCKLATAN 0.02-0.005 % SOLN [id number]  Place 1 drop into both eyes at bedtime. [provider]  Active   triamcinolone  (NASACORT ) 55 MCG/ACT AERO nasal inhaler [id number]  Place 1 spray into  the nose daily. [name], Edwin Care, MD  Active             Assessment/Plan:   Hypertension: - Currently uncontrolled based on last office BP of 136/68, BP goal <130/80 - Reviewed long term cardiovascular and renal outcomes of uncontrolled blood pressure - Recommended to choose the low-sodium option of canned vegetables if not using fresh or frozen vegetables - Reviewed appropriate blood pressure monitoring technique and reviewed goal blood pressure. Recommended to check home blood pressure and heart rate daily.  - Recommend to continue olmesartan   - Record home blood pressure readings and have those ready for f/u phone call  Gout: - Educated on symptoms of gout flare - Educated to limit intake of high uric acid foods - Encouraged good hydration - Continue allopurinol  - Stop by for walk-in labs next week to check uric acid  Hypokalemia: - Continue potassium supplement - Stop by for walk-in labs next week to recheck BMP  Follow Up  Plan: call in 1-2 weeks to discuss lab results and review home BP readings   Abelina Abide, PharmD PGY1 Pharmacy Resident 10/22/2023 10:33 AM   Rainelle Bur, PharmD, BCPS, CPP Clinical Pharmacist Practitioner [facility] [phone]

## 2023-10-22 NOTE — Patient Instructions (Addendum)
 It was a pleasure speaking with you today!  Continue taking olmesartan , allopurinol , and the potassium supplement. Come by the lab next week for walk-in labs. Cristy will give you a call afterwards to discuss the results and review your home blood pressure readings. Try to check your blood pressure at home at least once per day.  Feel free to call if you have any questions or concerns!  Abelina Abide, PharmD PGY1 Pharmacy Resident 10/22/2023 10:41 AM  Rainelle Bur, PharmD, BCPS, CPP Clinical Pharmacist Practitioner [facility] [phone]

## 2023-10-29 ENCOUNTER — Other Ambulatory Visit: Payer: Self-pay | Admitting: Internal Medicine

## 2023-10-29 DIAGNOSIS — F3342 Major depressive disorder, recurrent, in full remission: Secondary | ICD-10-CM

## 2023-11-05 ENCOUNTER — Telehealth: Payer: Self-pay | Admitting: Pharmacist

## 2023-11-05 NOTE — Telephone Encounter (Signed)
 Called patient to remind him to come for walk-in labs following 5/22 appt for hypokalemia and gout.  Rainelle Bur, PharmD, BCPS, CPP Clinical Pharmacist Practitioner [facility] [phone]

## 2023-11-10 ENCOUNTER — Other Ambulatory Visit (INDEPENDENT_AMBULATORY_CARE_PROVIDER_SITE_OTHER)

## 2023-11-10 ENCOUNTER — Ambulatory Visit: Payer: Self-pay | Admitting: Pharmacist

## 2023-11-10 DIAGNOSIS — M1000 Idiopathic gout, unspecified site: Secondary | ICD-10-CM | POA: Diagnosis not present

## 2023-11-10 DIAGNOSIS — I10 Essential (primary) hypertension: Secondary | ICD-10-CM

## 2023-11-10 LAB — BASIC METABOLIC PANEL WITH GFR
BUN: 12 mg/dL (ref 6–23)
CO2: 28 meq/L (ref 19–32)
Calcium: 9.2 mg/dL (ref 8.4–10.5)
Chloride: 101 meq/L (ref 96–112)
Creatinine, Ser: 0.9 mg/dL (ref 0.40–1.50)
GFR: 79.66 mL/min (ref >60.00)
Glucose, Bld: 104 mg/dL — ABNORMAL HIGH (ref 70–99)
Potassium: 3.9 meq/L (ref 3.5–5.1)
Sodium: 138 meq/L (ref 135–145)

## 2023-11-10 LAB — URIC ACID: Uric Acid, Serum: 5.9 mg/dL (ref 4.0–7.8)

## 2023-12-15 ENCOUNTER — Telehealth: Payer: Self-pay | Admitting: Allergy and Immunology

## 2023-12-15 NOTE — Telephone Encounter (Signed)
 Patient called and stated he needed a refill on his prescription for Prednisone .  [facility] on [address] [place].  Patient would like a call back when the prescription is called in.  Best Contact: [phone]

## 2023-12-16 NOTE — Progress Notes (Signed)
 [address] [place] [address] Dept: [phone]  FOLLOW UP NOTE  Patient ID: Edwin [name], male    DOB: January 14, 1941  Age: 83 y.o. MRN: [id number] Date of Office Visit: 12/17/2023  Assessment  Chief Complaint: Asthma, Follow-up, Cough, and Breathing Problem  HPI Edwin [name] is an 83 year old male presents to clinic for evaluation of cough.  He was last seen in this clinic on 05/19/2023 by Dr. Kozlow for evaluation of asthma COPD overlap syndrome, bronchiectasis, right diaphragmatic hemiparesis, recurrent sinusitis, adrenal insufficiency, herpes labialis, reflux, and allergic rhinitis.  In the interim, he reports that he has been taking 10 mg of prednisone  daily over the last 7 months and recently ran out of prednisone .    He reports over the last 3 to 4 days, since he has been out of prednisone , he has developed a cough producing thin clear mucus.  He denies fever, sweats, chills, or sick contacts.  He denies shortness of breath, cough, or wheeze with activity or rest.  He he reports general shortness of breath which he usually experiences that he continues to experience, infrequent wheeze, and cough producing thick clear mucus that began 3 days ago.  He continues Trelegy 200- 1 puff once a day and uses albuterol  about once a week with no real improvement in symptoms.  He continues supplemental oxygen  via nasal cannula as needed at nighttime only.  He does report that he has a percussion vest that he uses on occasion, however, has not used this recently.  Allergic rhinitis is reported as moderately well-controlled with occasional clear rhinorrhea, nasal congestion, sneezing, and increase in postnasal drainage recently.  He is not currently using Flonase or nasal saline rinses.  He does report that he has recently started using Mucinex  as needed with only moderate relief of symptoms.  Environmental allergy  skin testing from 05/02/2015 was positive to mold mix 1, mold mix 2, mold mix  3, mold mix 4, dust mite mix, and dog.  Reflux is reported as well-controlled with no symptoms including heartburn or vomiting.  He continues pantoprazole  and famotidine  with relief of symptoms.  He denies current episodes of diarrhea.  His current medications are listed in the chart.  Drug Allergies:  Allergies  Allergen Reactions   Brimonidine  Other (See Comments)    Conjunctivitis, elevated IOP   Ramipril Other (See Comments)    REACTION: chest congestion & cough. No angioedema    Physical Exam: BP (!) 140/84 (BP Location: Right Arm, Patient Position: Sitting, Cuff Size: Normal)   Pulse 70   Temp 98.6 F (37 C) (Oral)   Ht 5' 8.11 (1.73 m)   Wt 194 lb 1.6 oz (88 kg)   SpO2 96%   BMI 29.42 kg/m    Physical Exam Vitals reviewed.  Constitutional:      Appearance: Normal appearance.  HENT:     Head: Normocephalic and atraumatic.     Right Ear: Tympanic membrane normal.     Left Ear: Tympanic membrane normal.     Nose:     Comments: Bilateral nares slightly erythematous with thin clear nasal drainage noted.  Pharynx normal.  Ears normal.  Eyes normal.    Mouth/Throat:     Pharynx: Oropharynx is clear.  Eyes:     Conjunctiva/sclera: Conjunctivae normal.  Cardiovascular:     Rate and Rhythm: Normal rate and regular rhythm.     Heart sounds: Normal heart sounds. No murmur heard. Pulmonary:     Effort: Pulmonary effort  is normal.     Breath sounds: Normal breath sounds.     Comments: Lungs clear to auscultation Musculoskeletal:        General: Normal range of motion.     Cervical back: Normal range of motion and neck supple.  Skin:    General: Skin is warm and dry.  Neurological:     Mental Status: He is alert and oriented to person, place, and time.  Psychiatric:        Mood and Affect: Mood normal.        Behavior: Behavior normal.        Thought Content: Thought content normal.        Judgment: Judgment normal.     Diagnostics: FVC 2.77 which is 75% of  predicted value, FEV1 1.76 which is 64% of predicted value.  Spirometry indicates normal ventilatory function with reduced FEV1.  Assessment and Plan: 1. Moderate persistent asthma without complication   2. COPD with asthma (HCC)   3. Seasonal and perennial allergic rhinitis   4. Bronchiectasis without complication (HCC)   5. LPRD (laryngopharyngeal reflux disease)   6. Adrenal insufficiency (HCC)   7. Diaphragm dysfunction     Meds ordered this encounter  Medications   predniSONE  (DELTASONE ) 10 MG tablet    Sig: Take 1 tablet once a day for the next 7 days, then take 1/2 tablet once a day for the next 2 weeks    Dispense:  30 tablet    Refill:  0    Patient Instructions    1. Continue to Treat inflammation:   A. Trelegy 200 -1 inhalation 1 time per day (empty lungs)  B. Saline nasal rinse followed by Nasacort  daily for now  2. Continue to Treat reflux / LPR:   A. Pantoprazole  40 mg in the a.m. + famotidine  40 mg in the p.m.  3.  If needed:   A. Albuterol  HFA 2 puffs or Duoneb nebulization every 4-6 hours  B. nasal saline washes  C. OTC antihistamine  D. Mucinex  DM 2 tablets twice a day  E. Nasacort  one spray each nostril one time per day  F. Acyclovir  200 - 4 times a day during fever blister outbreak  G. Oxygen  at 2 liters [place]   H. Percussion vest   4. Treat possible steroid deficiency:   A. Prednisone  10 mg once a day for the next 7 days, then 5 mg once a day for the next 3 weeks  Call the clinic if this treatment plan is not working well for you  Follow up with Dr. Kozlow in 2 weeks or sooner if needed.  Return in about 2 weeks (around 12/31/2023), or if symptoms worsen or fail to improve.    Thank you for the opportunity to care for this patient.  Please do not hesitate to contact me with questions.  Edwin Mutter, FNP [facility] 

## 2023-12-16 NOTE — Telephone Encounter (Signed)
 I called patient and informed him that the prednisone  was to be discontinued as of January 2025. Patient stated that he was starting to cough again, so assumed that not taking the prednisone  was causing this. I informed patient that he would need to schedule an appointment for a follow up. Asha scheduled patient 01/26/2024 with Kozlow.

## 2023-12-16 NOTE — Patient Instructions (Incomplete)
   1. Continue to Treat inflammation:   A. Trelegy 200 -1 inhalation 1 time per day (empty lungs)  2. Continue to Treat reflux / LPR:   A. Pantoprazole  40 mg in the a.m. + famotidine  40 mg in the p.m.  3.  If needed:   A. Albuterol  HFA 2 puffs or Duoneb nebulization every 4-6 hours  B. nasal saline washes  C. OTC antihistamine  D. Mucinex  DM 2 tablets twice a day  E. Nasacort  one spray each nostril one time per day  F. Acyclovir  200 - 4 times a day during fever blister outbreak  G. Oxygen  at 2 liters [place]   H. Percussion vest   4. Treat possible steroid deficiency:   A. Prednisone  30 mg delivered in clinic today  B. Prednisone  10 mg - 1 tablet 1 time per day  5. Evaluation cause of diarrhea:   A. Stool for C diff. Toxin and culture, stool pathogens, wbc, O&P  B. Blood - CBC w/d, CMP, TSH, FT4  6. Further evaluation and treatment???

## 2023-12-17 ENCOUNTER — Ambulatory Visit (INDEPENDENT_AMBULATORY_CARE_PROVIDER_SITE_OTHER): Admitting: Family Medicine

## 2023-12-17 ENCOUNTER — Other Ambulatory Visit: Payer: Self-pay

## 2023-12-17 ENCOUNTER — Encounter: Payer: Self-pay | Admitting: Family Medicine

## 2023-12-17 VITALS — BP 140/84 | HR 70 | Temp 98.6°F | Ht 68.11 in | Wt 194.1 lb

## 2023-12-17 DIAGNOSIS — J3089 Other allergic rhinitis: Secondary | ICD-10-CM

## 2023-12-17 DIAGNOSIS — J986 Disorders of diaphragm: Secondary | ICD-10-CM | POA: Diagnosis not present

## 2023-12-17 DIAGNOSIS — J302 Other seasonal allergic rhinitis: Secondary | ICD-10-CM | POA: Insufficient documentation

## 2023-12-17 DIAGNOSIS — B001 Herpesviral vesicular dermatitis: Secondary | ICD-10-CM | POA: Insufficient documentation

## 2023-12-17 DIAGNOSIS — K219 Gastro-esophageal reflux disease without esophagitis: Secondary | ICD-10-CM

## 2023-12-17 DIAGNOSIS — E2740 Unspecified adrenocortical insufficiency: Secondary | ICD-10-CM | POA: Diagnosis not present

## 2023-12-17 DIAGNOSIS — J4540 Moderate persistent asthma, uncomplicated: Secondary | ICD-10-CM | POA: Diagnosis not present

## 2023-12-17 DIAGNOSIS — J479 Bronchiectasis, uncomplicated: Secondary | ICD-10-CM | POA: Insufficient documentation

## 2023-12-17 DIAGNOSIS — J4489 Other specified chronic obstructive pulmonary disease: Secondary | ICD-10-CM | POA: Diagnosis not present

## 2023-12-17 MED ORDER — PREDNISONE 10 MG PO TABS
ORAL_TABLET | ORAL | 0 refills | Status: DC
Start: 2023-12-17 — End: 2024-01-13

## 2023-12-30 ENCOUNTER — Ambulatory Visit (INDEPENDENT_AMBULATORY_CARE_PROVIDER_SITE_OTHER): Admitting: Podiatry

## 2023-12-30 ENCOUNTER — Encounter: Payer: Self-pay | Admitting: Podiatry

## 2023-12-30 DIAGNOSIS — M79675 Pain in left toe(s): Secondary | ICD-10-CM | POA: Diagnosis not present

## 2023-12-30 DIAGNOSIS — M79674 Pain in right toe(s): Secondary | ICD-10-CM

## 2023-12-30 DIAGNOSIS — B351 Tinea unguium: Secondary | ICD-10-CM

## 2023-12-30 DIAGNOSIS — E118 Type 2 diabetes mellitus with unspecified complications: Secondary | ICD-10-CM

## 2023-12-30 DIAGNOSIS — N1831 Chronic kidney disease, stage 3a: Secondary | ICD-10-CM

## 2023-12-30 NOTE — Progress Notes (Signed)
This patient returns to my office for at risk foot care.  This patient requires this care by a professional since this patient will be at risk due to having diabetes.  This patient is unable to cut nails himself since the patient cannot reach his nails.These nails are painful walking and wearing shoes.  This patient presents for at risk foot care today.  General Appearance  Alert, conversant and in no acute stress.  Vascular  Dorsalis pedis and posterior tibial  pulses are palpable  bilaterally.  Capillary return is within normal limits  bilaterally. Temperature is within normal limits  bilaterally.  Neurologic  Senn-Weinstein monofilament wire test within normal limits  bilaterally. Muscle power within normal limits bilaterally.  Nails Thick disfigured discolored nails with subungual debris  from hallux to fifth toes bilaterally. No evidence of bacterial infection or drainage bilaterally.  Orthopedic  No limitations of motion  feet .  No crepitus or effusions noted.  No bony pathology or digital deformities noted.  Skin  normotropic skin with no porokeratosis noted bilaterally.  No signs of infections or ulcers noted.     Onychomycosis  Pain in right toes  Pain in left toes  Consent was obtained for treatment procedures.   Mechanical debridement of nails 1-5  bilaterally performed with a nail nipper.  Filed with dremel without incident.    Return office visit  10 weeks                    Told patient to return for periodic foot care and evaluation due to potential at risk complications.   Reesa Gotschall DPM   

## 2023-12-31 ENCOUNTER — Other Ambulatory Visit: Payer: Self-pay | Admitting: Internal Medicine

## 2023-12-31 DIAGNOSIS — E785 Hyperlipidemia, unspecified: Secondary | ICD-10-CM

## 2023-12-31 DIAGNOSIS — E876 Hypokalemia: Secondary | ICD-10-CM

## 2023-12-31 DIAGNOSIS — M1000 Idiopathic gout, unspecified site: Secondary | ICD-10-CM

## 2024-01-04 NOTE — Telephone Encounter (Signed)
 Last OV 09/30/23 Next OV 10/04/24  Last refill(s)  Allopurinol  09/30/23 Qty #90/0  Atorvastatin  09/30/23 Qty #90/0  Potassium Chloride  10/01/23 Qty #180/0

## 2024-01-06 DIAGNOSIS — D225 Melanocytic nevi of trunk: Secondary | ICD-10-CM | POA: Diagnosis not present

## 2024-01-06 DIAGNOSIS — Z85820 Personal history of malignant melanoma of skin: Secondary | ICD-10-CM | POA: Diagnosis not present

## 2024-01-06 DIAGNOSIS — Z08 Encounter for follow-up examination after completed treatment for malignant neoplasm: Secondary | ICD-10-CM | POA: Diagnosis not present

## 2024-01-06 DIAGNOSIS — L570 Actinic keratosis: Secondary | ICD-10-CM | POA: Diagnosis not present

## 2024-01-06 DIAGNOSIS — X32XXXD Exposure to sunlight, subsequent encounter: Secondary | ICD-10-CM | POA: Diagnosis not present

## 2024-01-06 DIAGNOSIS — Z1283 Encounter for screening for malignant neoplasm of skin: Secondary | ICD-10-CM | POA: Diagnosis not present

## 2024-01-13 ENCOUNTER — Other Ambulatory Visit: Payer: Self-pay | Admitting: Family Medicine

## 2024-01-26 ENCOUNTER — Ambulatory Visit: Admitting: Allergy and Immunology

## 2024-01-27 ENCOUNTER — Other Ambulatory Visit: Payer: Self-pay | Admitting: Allergy and Immunology

## 2024-01-28 ENCOUNTER — Other Ambulatory Visit: Payer: Self-pay | Admitting: Internal Medicine

## 2024-01-28 DIAGNOSIS — F3342 Major depressive disorder, recurrent, in full remission: Secondary | ICD-10-CM

## 2024-02-04 ENCOUNTER — Other Ambulatory Visit: Payer: Self-pay | Admitting: Family Medicine

## 2024-02-07 ENCOUNTER — Other Ambulatory Visit: Payer: Self-pay | Admitting: Allergy and Immunology

## 2024-03-10 ENCOUNTER — Encounter: Payer: Self-pay | Admitting: Podiatry

## 2024-03-10 ENCOUNTER — Ambulatory Visit: Admitting: Podiatry

## 2024-03-10 DIAGNOSIS — M79675 Pain in left toe(s): Secondary | ICD-10-CM

## 2024-03-10 DIAGNOSIS — E118 Type 2 diabetes mellitus with unspecified complications: Secondary | ICD-10-CM | POA: Diagnosis not present

## 2024-03-10 DIAGNOSIS — M79674 Pain in right toe(s): Secondary | ICD-10-CM

## 2024-03-10 DIAGNOSIS — B351 Tinea unguium: Secondary | ICD-10-CM | POA: Diagnosis not present

## 2024-03-10 NOTE — Progress Notes (Signed)
This patient returns to my office for at risk foot care.  This patient requires this care by a professional since this patient will be at risk due to having diabetes.  This patient is unable to cut nails himself since the patient cannot reach his nails.These nails are painful walking and wearing shoes.  This patient presents for at risk foot care today.  General Appearance  Alert, conversant and in no acute stress.  Vascular  Dorsalis pedis and posterior tibial  pulses are palpable  bilaterally.  Capillary return is within normal limits  bilaterally. Temperature is within normal limits  bilaterally.  Neurologic  Senn-Weinstein monofilament wire test within normal limits  bilaterally. Muscle power within normal limits bilaterally.  Nails Thick disfigured discolored nails with subungual debris  from hallux to fifth toes bilaterally. No evidence of bacterial infection or drainage bilaterally.  Orthopedic  No limitations of motion  feet .  No crepitus or effusions noted.  No bony pathology or digital deformities noted.  Skin  normotropic skin with no porokeratosis noted bilaterally.  No signs of infections or ulcers noted.     Onychomycosis  Pain in right toes  Pain in left toes  Consent was obtained for treatment procedures.   Mechanical debridement of nails 1-5  bilaterally performed with a nail nipper.  Filed with dremel without incident.    Return office visit  10 weeks                    Told patient to return for periodic foot care and evaluation due to potential at risk complications.   Reesa Gotschall DPM   

## 2024-03-20 ENCOUNTER — Other Ambulatory Visit: Payer: Self-pay | Admitting: Family Medicine

## 2024-03-31 DIAGNOSIS — H401132 Primary open-angle glaucoma, bilateral, moderate stage: Secondary | ICD-10-CM | POA: Diagnosis not present

## 2024-03-31 DIAGNOSIS — H353112 Nonexudative age-related macular degeneration, right eye, intermediate dry stage: Secondary | ICD-10-CM | POA: Diagnosis not present

## 2024-04-05 DIAGNOSIS — Z86006 Personal history of melanoma in-situ: Secondary | ICD-10-CM | POA: Diagnosis not present

## 2024-04-05 DIAGNOSIS — Z08 Encounter for follow-up examination after completed treatment for malignant neoplasm: Secondary | ICD-10-CM | POA: Diagnosis not present

## 2024-04-05 DIAGNOSIS — D225 Melanocytic nevi of trunk: Secondary | ICD-10-CM | POA: Diagnosis not present

## 2024-04-05 DIAGNOSIS — C44519 Basal cell carcinoma of skin of other part of trunk: Secondary | ICD-10-CM | POA: Diagnosis not present

## 2024-04-05 DIAGNOSIS — Z1283 Encounter for screening for malignant neoplasm of skin: Secondary | ICD-10-CM | POA: Diagnosis not present

## 2024-04-05 DIAGNOSIS — L821 Other seborrheic keratosis: Secondary | ICD-10-CM | POA: Diagnosis not present

## 2024-04-05 DIAGNOSIS — B078 Other viral warts: Secondary | ICD-10-CM | POA: Diagnosis not present

## 2024-04-26 DIAGNOSIS — Z23 Encounter for immunization: Secondary | ICD-10-CM | POA: Diagnosis not present

## 2024-05-01 ENCOUNTER — Other Ambulatory Visit: Payer: Self-pay | Admitting: Internal Medicine

## 2024-05-01 DIAGNOSIS — F3342 Major depressive disorder, recurrent, in full remission: Secondary | ICD-10-CM

## 2024-05-17 DIAGNOSIS — Z85828 Personal history of other malignant neoplasm of skin: Secondary | ICD-10-CM | POA: Diagnosis not present

## 2024-05-17 DIAGNOSIS — Z08 Encounter for follow-up examination after completed treatment for malignant neoplasm: Secondary | ICD-10-CM | POA: Diagnosis not present

## 2024-05-17 DIAGNOSIS — I872 Venous insufficiency (chronic) (peripheral): Secondary | ICD-10-CM | POA: Diagnosis not present

## 2024-05-19 ENCOUNTER — Ambulatory Visit: Admitting: Podiatry

## 2024-05-19 ENCOUNTER — Encounter: Payer: Self-pay | Admitting: Podiatry

## 2024-05-19 VITALS — Ht 68.0 in | Wt 194.1 lb

## 2024-05-19 DIAGNOSIS — M79674 Pain in right toe(s): Secondary | ICD-10-CM | POA: Diagnosis not present

## 2024-05-19 DIAGNOSIS — M79675 Pain in left toe(s): Secondary | ICD-10-CM

## 2024-05-19 DIAGNOSIS — E118 Type 2 diabetes mellitus with unspecified complications: Secondary | ICD-10-CM

## 2024-05-19 DIAGNOSIS — N1831 Chronic kidney disease, stage 3a: Secondary | ICD-10-CM

## 2024-05-19 DIAGNOSIS — B351 Tinea unguium: Secondary | ICD-10-CM | POA: Diagnosis not present

## 2024-05-19 NOTE — Progress Notes (Signed)
This patient returns to my office for at risk foot care.  This patient requires this care by a professional since this patient will be at risk due to having diabetes.  This patient is unable to cut nails himself since the patient cannot reach his nails.These nails are painful walking and wearing shoes.  This patient presents for at risk foot care today.  General Appearance  Alert, conversant and in no acute stress.  Vascular  Dorsalis pedis and posterior tibial  pulses are palpable  bilaterally.  Capillary return is within normal limits  bilaterally. Temperature is within normal limits  bilaterally.  Neurologic  Senn-Weinstein monofilament wire test within normal limits  bilaterally. Muscle power within normal limits bilaterally.  Nails Thick disfigured discolored nails with subungual debris  from hallux to fifth toes bilaterally. No evidence of bacterial infection or drainage bilaterally.  Orthopedic  No limitations of motion  feet .  No crepitus or effusions noted.  No bony pathology or digital deformities noted.  Skin  normotropic skin with no porokeratosis noted bilaterally.  No signs of infections or ulcers noted.     Onychomycosis  Pain in right toes  Pain in left toes  Consent was obtained for treatment procedures.   Mechanical debridement of nails 1-5  bilaterally performed with a nail nipper.  Filed with dremel without incident.    Return office visit  12 weeks                    Told patient to return for periodic foot care and evaluation due to potential at risk complications.   Joyice Magda DPM  

## 2024-06-24 ENCOUNTER — Other Ambulatory Visit: Payer: Self-pay | Admitting: Internal Medicine

## 2024-06-24 DIAGNOSIS — M1000 Idiopathic gout, unspecified site: Secondary | ICD-10-CM

## 2024-06-24 DIAGNOSIS — E785 Hyperlipidemia, unspecified: Secondary | ICD-10-CM

## 2024-06-24 DIAGNOSIS — E876 Hypokalemia: Secondary | ICD-10-CM

## 2024-06-29 ENCOUNTER — Other Ambulatory Visit: Payer: Self-pay | Admitting: Allergy and Immunology

## 2024-08-18 ENCOUNTER — Ambulatory Visit: Admitting: Podiatry

## 2024-10-04 ENCOUNTER — Encounter: Admitting: Internal Medicine

## 2024-10-04 ENCOUNTER — Ambulatory Visit
